# Patient Record
Sex: Female | Born: 1965 | Race: White | Hispanic: No | Marital: Married | Smoking: Never smoker
Health system: Southern US, Community
[De-identification: ages and names within clinical notes are randomized; demographics above are authoritative.]

## PROBLEM LIST (undated history)

## (undated) VITALS — BP 131/87 | HR 84 | Temp 97.1°F | Resp 16 | Ht 63.0 in | Wt 176.0 lb

## (undated) DIAGNOSIS — K219 Gastro-esophageal reflux disease without esophagitis: Secondary | ICD-10-CM

## (undated) DIAGNOSIS — R0602 Shortness of breath: Secondary | ICD-10-CM

## (undated) DIAGNOSIS — R011 Cardiac murmur, unspecified: Secondary | ICD-10-CM

## (undated) DIAGNOSIS — E669 Obesity, unspecified: Secondary | ICD-10-CM

## (undated) DIAGNOSIS — R454 Irritability and anger: Secondary | ICD-10-CM

## (undated) DIAGNOSIS — F32A Depression, unspecified: Secondary | ICD-10-CM

## (undated) DIAGNOSIS — E785 Hyperlipidemia, unspecified: Secondary | ICD-10-CM

## (undated) DIAGNOSIS — G4730 Sleep apnea, unspecified: Secondary | ICD-10-CM

## (undated) DIAGNOSIS — N951 Menopausal and female climacteric states: Secondary | ICD-10-CM

## (undated) DIAGNOSIS — F419 Anxiety disorder, unspecified: Secondary | ICD-10-CM

## (undated) DIAGNOSIS — I7300 Raynaud's syndrome without gangrene: Secondary | ICD-10-CM

## (undated) DIAGNOSIS — M1990 Unspecified osteoarthritis, unspecified site: Secondary | ICD-10-CM

## (undated) DIAGNOSIS — F1011 Alcohol abuse, in remission: Secondary | ICD-10-CM

## (undated) DIAGNOSIS — I10 Essential (primary) hypertension: Secondary | ICD-10-CM

## (undated) DIAGNOSIS — M549 Dorsalgia, unspecified: Secondary | ICD-10-CM

## (undated) DIAGNOSIS — F329 Major depressive disorder, single episode, unspecified: Secondary | ICD-10-CM

## (undated) DIAGNOSIS — H269 Unspecified cataract: Secondary | ICD-10-CM

## (undated) DIAGNOSIS — R079 Chest pain, unspecified: Secondary | ICD-10-CM

## (undated) DIAGNOSIS — F1990 Other psychoactive substance use, unspecified, uncomplicated: Secondary | ICD-10-CM

## (undated) DIAGNOSIS — M79671 Pain in right foot: Secondary | ICD-10-CM

## (undated) DIAGNOSIS — R5383 Other fatigue: Secondary | ICD-10-CM

## (undated) DIAGNOSIS — Z124 Encounter for screening for malignant neoplasm of cervix: Secondary | ICD-10-CM

## (undated) DIAGNOSIS — M79672 Pain in left foot: Secondary | ICD-10-CM

## (undated) HISTORY — DX: Major depressive disorder, single episode, unspecified: F32.9

## (undated) HISTORY — DX: Obesity, unspecified: E66.9

## (undated) HISTORY — DX: Depression, unspecified: F32.A

## (undated) HISTORY — DX: Unspecified osteoarthritis, unspecified site: M19.90

## (undated) HISTORY — DX: Encounter for screening for malignant neoplasm of cervix: Z12.4

## (undated) HISTORY — DX: Pain in right foot: M79.671

## (undated) HISTORY — DX: Anxiety disorder, unspecified: F41.9

## (undated) HISTORY — DX: Cardiac murmur, unspecified: R01.1

## (undated) HISTORY — DX: Shortness of breath: R06.02

## (undated) HISTORY — DX: Alcohol abuse, in remission: F10.11

## (undated) HISTORY — DX: Other psychoactive substance use, unspecified, uncomplicated: F19.90

## (undated) HISTORY — DX: Irritability and anger: R45.4

## (undated) HISTORY — DX: Pain in right foot: M79.672

## (undated) HISTORY — DX: Menopausal and female climacteric states: N95.1

## (undated) HISTORY — DX: Raynaud's syndrome without gangrene: I73.00

## (undated) HISTORY — DX: Essential (primary) hypertension: I10

## (undated) HISTORY — DX: Gastro-esophageal reflux disease without esophagitis: K21.9

## (undated) HISTORY — DX: Unspecified cataract: H26.9

## (undated) HISTORY — DX: Sleep apnea, unspecified: G47.30

## (undated) HISTORY — DX: Chest pain, unspecified: R07.9

## (undated) HISTORY — DX: Other fatigue: R53.83

## (undated) HISTORY — DX: Dorsalgia, unspecified: M54.9

## (undated) HISTORY — DX: Hyperlipidemia, unspecified: E78.5

---
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
## 2006-10-24 ENCOUNTER — Other Ambulatory Visit: Admission: RE | Admit: 2006-10-24 | Discharge: 2006-10-24 | Payer: Self-pay | Admitting: Family Medicine

## 2008-10-15 ENCOUNTER — Other Ambulatory Visit: Admission: RE | Admit: 2008-10-15 | Discharge: 2008-10-15 | Payer: Self-pay | Admitting: Family Medicine

## 2009-01-30 ENCOUNTER — Emergency Department (HOSPITAL_BASED_OUTPATIENT_CLINIC_OR_DEPARTMENT_OTHER): Admission: EM | Admit: 2009-01-30 | Discharge: 2009-01-30 | Payer: Self-pay | Admitting: Emergency Medicine

## 2009-08-09 ENCOUNTER — Emergency Department (HOSPITAL_BASED_OUTPATIENT_CLINIC_OR_DEPARTMENT_OTHER): Admission: EM | Admit: 2009-08-09 | Discharge: 2009-08-09 | Payer: Self-pay | Admitting: Emergency Medicine

## 2009-10-20 ENCOUNTER — Other Ambulatory Visit: Admission: RE | Admit: 2009-10-20 | Discharge: 2009-10-20 | Payer: Self-pay | Admitting: Family Medicine

## 2010-04-19 ENCOUNTER — Encounter: Payer: Self-pay | Admitting: Family Medicine

## 2010-04-20 ENCOUNTER — Encounter: Payer: Self-pay | Admitting: Family Medicine

## 2010-04-20 ENCOUNTER — Ambulatory Visit (INDEPENDENT_AMBULATORY_CARE_PROVIDER_SITE_OTHER): Payer: 59 | Admitting: Family Medicine

## 2010-04-20 DIAGNOSIS — E559 Vitamin D deficiency, unspecified: Secondary | ICD-10-CM | POA: Insufficient documentation

## 2010-04-20 DIAGNOSIS — F1010 Alcohol abuse, uncomplicated: Secondary | ICD-10-CM | POA: Insufficient documentation

## 2010-04-20 DIAGNOSIS — R030 Elevated blood-pressure reading, without diagnosis of hypertension: Secondary | ICD-10-CM | POA: Insufficient documentation

## 2010-04-20 DIAGNOSIS — F32A Depression, unspecified: Secondary | ICD-10-CM | POA: Insufficient documentation

## 2010-04-20 DIAGNOSIS — F329 Major depressive disorder, single episode, unspecified: Secondary | ICD-10-CM

## 2010-04-20 MED ORDER — LORAZEPAM 1 MG PO TABS: 1.0000 mg | ORAL_TABLET | Freq: Four times a day (QID) | ORAL | Status: DC | PRN

## 2010-04-20 NOTE — Assessment & Plan Note (Signed)
Patient with previous history that was treated with hi dose vitamin d supplements and it normalized, we will recheck levels

## 2010-04-20 NOTE — Progress Notes (Signed)
Subjective:    Patient ID: Cheryl Taylor, female    DOB: February 06, 1965, 45 y.o.   MRN: [id number]  HPI Patient is in today for a new patient appt to discuss multiple concerns. She has a significant history of alcohol abuse. She has quit in the past but has recently begun to drink again and is now up to 2 bottles of wine daily. She decided to quit drinking cold [place] yesterday and is feeling very anxious and jittery today. She reports she feels like her skin is crawling. No CP/palp/SOB/HA/fevers/chills. She has used Xanax in the past to manage her withdrawal symptoms with good effect. She is currently seeing a counselor once a week recently to manage her depression and withdrawal and she feels commited to quitting. She initially began to see the counselor due to some marriage difficulties and soon realized that she had to quit drinking before she could address her marriage difficulties. She is struggling with depression, anxiety, insomnia and agitation. She uses some Tylenol pm prn for her insomnia and that helps to a degree. No other acute concerns or recent illness/trips to the hospital    Review of Systems  Constitutional: Positive for activity change, appetite change and fatigue. Negative for fever and chills.  HENT: Negative for congestion and rhinorrhea.   Respiratory: Negative for cough, chest tightness, shortness of breath and wheezing.   Cardiovascular: Negative for palpitations and leg swelling.  Gastrointestinal: Negative for vomiting, abdominal pain, diarrhea, constipation, blood in stool and anal bleeding.  Genitourinary: Negative for dysuria, vaginal bleeding, vaginal discharge, difficulty urinating, menstrual problem and pelvic pain.  Musculoskeletal: Negative for back pain.  Neurological: Positive for tremors. Negative for seizures, syncope and headaches.  Psychiatric/Behavioral: Positive for behavioral problems, sleep disturbance, dysphoric mood and agitation. Negative for suicidal  ideas, hallucinations and confusion. The patient is nervous/anxious.    Past Medical History  Diagnosis Date  . Depression   . Anxiety   . Tachycardia 04/20/2010   Past Surgical History  Procedure Date  . Cesarean section 2003  . Tonsillectomy 1980    reports that she has never smoked. She has never used smokeless tobacco. She reports that she drinks about 30 ounces of alcohol per week. She reports that she does not use illicit drugs. family history includes Alcohol abuse in her brother and sister; Heart disease (age of onset:78) in her father; Kidney disease in her father; Kidney failure (age of onset:78) in her father; Obesity in her father and paternal grandfather; Other in her brother; Schizophrenia in her brother; and Seizures in her father. No Known Allergies      Objective:   Physical Exam  Constitutional: She is oriented to person, place, and time. She appears well-developed and well-nourished. She appears distressed.  HENT:  Head: Atraumatic.  Right Ear: External ear normal.  Left Ear: External ear normal.  Nose: Nose normal.  Mouth/Throat: Oropharynx is clear and moist. No oropharyngeal exudate.  Eyes: Conjunctivae and EOM are normal. Pupils are equal, round, and reactive to light. No scleral icterus.  Neck: Normal range of motion. Neck supple. No thyromegaly present.  Cardiovascular: Normal rate, regular rhythm and intact distal pulses.  Exam reveals no gallop and no friction rub.   No murmur heard. Pulmonary/Chest: No respiratory distress. She has no wheezes.  Abdominal: Soft. Bowel sounds are normal. She exhibits no distension and no mass. There is no tenderness. There is no rebound and no guarding.  Lymphadenopathy:    She has no cervical adenopathy.  Neurological: She  is alert and oriented to person, place, and time. She has normal reflexes. She displays normal reflexes. No cranial nerve deficit. Coordination normal.  Skin: Skin is warm and dry. She is not  diaphoretic.  Psychiatric: Her speech is normal. Thought content normal. Her mood appears anxious. Her affect is not angry and not blunt. She is agitated. She is not aggressive, is not hyperactive, not slowed, not withdrawn, not actively hallucinating and not combative. Cognition and memory are not impaired. She is attentive.          Assessment & Plan:  Alcohol abuse Patient will continue to abstain to alcohol, she is given Ativan 1mg  tabs to use as directed for the next several days to help with withdrawal symptoms. She will seek immediate medical care if her symptoms are overwhelming. Patient is declining inpatient hospitalization. Will see her as needed or in 1 week  Depression Patient is seeing a counselor weekly and taking Wellbutrin XL 150mg  daily to help manage her low moods and intermittent anxiety and she feels this is helping her to stabilize her mood enough to help her quit drinking, she is encouraged to continue both.  Vitamin D deficiency Patient with previous history that was treated with hi dose vitamin d supplements and it normalized, we will recheck levels   Blood pressure elevated without history of HTN Likely related to alcohol withdrawal, will reassess in one week and she maintain regular use of Ativan for this week, reassess after acute withdrawal period.

## 2010-04-20 NOTE — Assessment & Plan Note (Addendum)
Patient will continue to abstain to alcohol, she is given Ativan 1mg  tabs to use as directed for the next several days to help with withdrawal symptoms. She will seek immediate medical care if her symptoms are overwhelming. Patient is declining inpatient hospitalization. Will see her as needed or in 1 week

## 2010-04-20 NOTE — Assessment & Plan Note (Signed)
Likely related to alcohol withdrawal, will reassess in one week and she maintain regular use of Ativan for this week, reassess after acute withdrawal period.

## 2010-04-20 NOTE — Patient Instructions (Signed)
Alcohol Withdrawal Anytime drug use is interfering with normal living activities it has become abuse. This includes problems with family and friends. Psychological dependence has developed when your mind tells you that the drug is needed. This is usually followed by physical dependence when a continuing increase of drugs are required to get the same feeling or "high". This is known as addiction or chemical dependency. A person's risk is much higher if there is a history of chemical dependency in the family. Mild Withdrawal Following Stopping Alcohol, When Addiction or Chemical Dependency Has Developed When a person has developed tolerance to alcohol, any sudden stopping of alcohol can cause uncomfortable physical symptoms (problems). Most of the time these are mild and consist of tremors in the hands and increases in heart rate, breathing, and temperature. Sometimes these symptoms are associated with anxiety, panic attacks, and bad dreams. There may also be stomach upset. Normal sleep patterns are often interrupted with periods of insomnia (inability to sleep). This may last for 6 months. Because of this discomfort, many people choose to continue drinking to get rid of this discomfort and to try to feel normal. Severe Withdrawal with Decreased or No Alcohol Intake, When Addiction or Chemical Dependency Has Developed About five percent of alcoholics will develop signs of severe withdrawal when they stop using alcohol. One sign of this is development of generalized seizures (convulsions). Other signs of this are severe agitation and confusion. This may be associated with delusions and hallucinations (believing in things which are not real or seeing things which are not really there). Vitamin deficiencies are usually present if alcohol intake has been long term. Treatment for this most often requires hospitalization and close observation. Addiction can only be helped by stopping use of all chemicals. This is hard  but may save your life. With continual alcohol use possible outcomes are usually loss of self respect and esteem, violence, and death. It can eventually lead to prison if the use of alcohol has caused the death of another. Addiction cannot be cured but it can be stopped. This often requires outside help and the care of professionals. Treatment centers are listed in the yellow pages under Cocaine, Narcotics, and Alcoholics anonymous. Most hospitals and clinics can refer you to a specialized care center. It is not necessary for you to go through the uncomfortable symptoms of withdrawal. Your caregiver can provide you with medications that will help you through this difficult period. Try to avoid situations, friends, or drugs that made it possible for you to keep using alcohol in the past. Learn how to say no! It takes a long period of time to overcome addictions to all drugs, including alcohol. There may be many times when you feel as though you want a drink. All this amounts to is a want. After getting rid of the physical addiction and withdrawal, you will have a lessening of the craving which tells you that you need alcohol to feel normal. Call your caregiver if more support is needed. Learn who to talk to in your family and among your friends so that during these periods you can receive outside help. AA (Alcoholics Anonymous) has helped many people over the years. To get further help, contact AA or call your caregiver, counselor or minister. AL-ANON and ALA-TEEN are support groups for friends and family members of an alcoholic. The people who love and care for an alcoholic often need help, too. For information about these organizations, check your phone directory or call a local alcoholism  treatment center.  SEEK IMMEDIATE MEDICAL CARE IF:  You have a seizure.   You develop a high fever.   You experience uncontrolled vomiting or you vomit up blood. This may be bright red or look like black coffee grounds.    You have blood in the stool. This may be bright red or appear as a black tarry, foul smelling stool.   You become light headed or faint. DO NOT DRIVE IF YOU FEEL THIS WAY. Have someone else drive you or call 161 for help.   You become more agitated or confused.   You develop uncontrolled anxiety.   You begin to have hallucinations (see things that are not really there).  Your caregiver has determined that you completely understand your medical condition, and that your mental state is back to normal. You understand that you have been treated for alcohol withdrawal, have agreed not to drink any alcohol for a minimum of 1 day, will not operate a car or other machinery for 24 hours, and have had an opportunity to ask any questions about your condition. Document Released: 10/27/2004 Document Re-Released: 04/13/2009 [facility] Patient Information 2011 [place], [place]. Call with any concerns and take Ativan as directed

## 2010-04-20 NOTE — Assessment & Plan Note (Signed)
Patient is seeing a counselor weekly and taking Wellbutrin XL 150mg  daily to help manage her low moods and intermittent anxiety and she feels this is helping her to stabilize her mood enough to help her quit drinking, she is encouraged to continue both.

## 2010-04-22 ENCOUNTER — Telehealth: Payer: Self-pay

## 2010-04-22 NOTE — Telephone Encounter (Signed)
Pt informed and transferred call to Diane to schedule appt for tomorrow morning

## 2010-04-22 NOTE — Telephone Encounter (Signed)
It is not likely the Ativan but instead the withdrawal from the Alcohol. She probably needs to be evaluated she could see [facility] or an urgent care potentially this afternoon or me quickly tomorrow. Tonight she can try more Ativan 1-2 tabs q 6 hours and if no relief then would have to come back in

## 2010-04-23 ENCOUNTER — Ambulatory Visit: Payer: 59 | Admitting: Family Medicine

## 2010-04-28 ENCOUNTER — Encounter: Payer: Self-pay | Admitting: Family Medicine

## 2010-04-28 ENCOUNTER — Ambulatory Visit (INDEPENDENT_AMBULATORY_CARE_PROVIDER_SITE_OTHER): Payer: 59 | Admitting: Family Medicine

## 2010-04-28 DIAGNOSIS — F32A Depression, unspecified: Secondary | ICD-10-CM

## 2010-04-28 DIAGNOSIS — E559 Vitamin D deficiency, unspecified: Secondary | ICD-10-CM

## 2010-04-28 DIAGNOSIS — R030 Elevated blood-pressure reading, without diagnosis of hypertension: Secondary | ICD-10-CM

## 2010-04-28 DIAGNOSIS — F329 Major depressive disorder, single episode, unspecified: Secondary | ICD-10-CM

## 2010-04-28 DIAGNOSIS — F1010 Alcohol abuse, uncomplicated: Secondary | ICD-10-CM

## 2010-04-28 MED ORDER — LORAZEPAM 1 MG PO TABS: 1.0000 mg | ORAL_TABLET | Freq: Four times a day (QID) | ORAL | Status: DC | PRN

## 2010-04-28 NOTE — Assessment & Plan Note (Signed)
Improved with recheck. Patient has obtained a BP monitor if it remains elevated above 165/105 she is to call for reevaluation. Continue to titrate down Ativan as tolerated. Recheck in one month and will check labs at that visit as well

## 2010-04-28 NOTE — Assessment & Plan Note (Signed)
Agrees to lab work to reevaluate at next visit.

## 2010-04-28 NOTE — Assessment & Plan Note (Addendum)
Patient has stayed off Alcohol since last Monday now 9 days. She acknowledges agitations, diaphoresis, irritability but she does feel it is getting better. She started Ativan 4 x daily, then decreased to 3 x daily and began to feel more agitated so she took a Xanax and that helped. Presently she has titrated down to 1 Ativan bid and Xanax 1/2 tab midday. Continue to abstain and call with any concerns

## 2010-04-28 NOTE — Assessment & Plan Note (Signed)
She feels she is doing well with regards to her low mood despite her difficulties withdrawing from the alcohol and Xanax no further meds at this time, Maintain the Wellbutrin

## 2010-04-28 NOTE — Progress Notes (Signed)
Subjective:    Patient ID: Cheryl Taylor, female    DOB: May 06, 1965, 45 y.o.   MRN: [id number]  HPI Patient is in today for follow up on her new patient appt and alcohol withdrawal. She has now gone 10 days since she had any alcohol and has managed to go to work everyday. She admits to feeling agitated, sweaty, irritable and having trouble concentrating and sleeping but she denies any hallucinations, diarrhea, CP, SOB. She acknowledges symptoms are slowly improving. She has gotten a BP monitor and she reports her BP was 150/100 last night and 124/104 this am and those are the best numbers she has seen since last week. She denies any HA or syncope. She did not initially think Ativan was helping but now notes it does some but she has also started to use 1/2 of her left over Xanax inbetween her Ativan dosing and she realizes that gives her more relief. She can now see that she was also habituated to Xanax somewhat and is committed to coming off that as well. No recent illness, fevers, chills, congestion since. Past Medical History  Diagnosis Date  . Depression   . Anxiety   . Tachycardia 04/20/2010    Past Surgical History  Procedure Date  . Cesarean section 2003  . Tonsillectomy 1980    Family History  Problem Relation Age of Onset  . Kidney failure Father 58    deceased  . Kidney disease Father   . Obesity Father   . Seizures Father   . Heart disease Father 15  . Schizophrenia Brother     52yo  . Alcohol abuse Brother   . Other Brother     chronic pain, 57 yo  . Alcohol abuse Sister     5yo  . Obesity Paternal Grandfather     History   Social History  . Marital Status: Married    Spouse Name: N/A    Number of Children: N/A  . Years of Education: N/A   Occupational History  . Not on file.   Social History Main Topics  . Smoking status: Never Smoker   . Smokeless tobacco: Never Used  . Alcohol Use: No     2 bottle a night X 7 days/ pt quit on 04/19/10  . Drug Use: No   . Sexually Active: Yes -- Female partner(s)    Birth Control/ Protection: Pill   Other Topics Concern  . Not on file   Social History Narrative  . No narrative on file    Current Outpatient Prescriptions on File Prior to Visit  Medication Sig Dispense Refill  . buPROPion (WELLBUTRIN XL) 150 MG 24 hr tablet Take 150 mg by mouth daily.        . LO LOESTRIN FE 1 MG-10 MCG / 10 MCG TABS Take by mouth Daily.      Marland Kitchen DISCONTD: LORazepam (ATIVAN) 1 MG tablet Take 1 tablet (1 mg total) by mouth every 6 (six) hours as needed for anxiety (take 3 x daily for 4 days then 2 x daily and as needed for insomnia/anxiety/withdrawal, max 4 tab/24hr).  60 tablet  0  . NON FORMULARY 1 tablet daily. Loestrin 1 daily         No Known Allergies    Review of Systems  Constitutional: Positive for diaphoresis, activity change and fatigue. Negative for fever, chills and appetite change.  HENT: Negative for congestion and neck pain.   Respiratory: Negative for chest tightness and shortness of breath.  Cardiovascular: Positive for palpitations. Negative for chest pain and leg swelling.  Gastrointestinal: Negative for nausea, vomiting, abdominal pain, diarrhea and constipation.  Genitourinary: Negative for frequency.  Musculoskeletal: Negative for myalgias.  Neurological: Positive for tremors and light-headedness. Negative for dizziness, syncope and headaches.  Psychiatric/Behavioral: Positive for sleep disturbance, dysphoric mood and agitation. Negative for suicidal ideas, hallucinations and self-injury. The patient is nervous/anxious.        Objective:   Physical Exam  Constitutional: She is oriented to person, place, and time. She appears well-developed and well-nourished. No distress.  HENT:  Head: Atraumatic.  Eyes: Conjunctivae are normal.  Neck: Neck supple. No thyromegaly present.  Cardiovascular: Normal rate, regular rhythm and normal heart sounds.   Pulmonary/Chest: Effort normal. No  respiratory distress.  Abdominal: Soft. She exhibits no distension.  Musculoskeletal: She exhibits no edema.  Neurological: She is alert and oriented to person, place, and time.  Skin: Skin is warm and dry. She is not diaphoretic.  Psychiatric: Her behavior is normal. Judgment and thought content normal.       Slightly pressured speech          Assessment & Plan:  Alcohol abuse Patient has stayed off Alcohol since last Monday now 9 days. She acknowledges agitations, diaphoresis, irritability but she does feel it is getting better. She started Ativan 4 x daily, then decreased to 3 x daily and began to feel more agitated so she took a Xanax and that helped. Presently she has titrated down to 1 Ativan bid and Xanax 1/2 tab midday. Continue to abstain and call with any concerns  Blood pressure elevated without history of HTN Improved with recheck. Patient has obtained a BP monitor if it remains elevated above 165/105 she is to call for reevaluation. Continue to titrate down Ativan as tolerated. Recheck in one month and will check labs at that visit as well  Depression She feels she is doing well with regards to her low mood despite her difficulties withdrawing from the alcohol and Xanax no further meds at this time, Maintain the Wellbutrin  Vitamin D deficiency Agrees to lab work to reevaluate at next visit.

## 2010-04-28 NOTE — Patient Instructions (Signed)

## 2010-05-31 ENCOUNTER — Ambulatory Visit: Payer: 59 | Admitting: Family Medicine

## 2010-06-07 ENCOUNTER — Encounter: Payer: Self-pay | Admitting: Family Medicine

## 2010-06-07 ENCOUNTER — Ambulatory Visit (INDEPENDENT_AMBULATORY_CARE_PROVIDER_SITE_OTHER): Payer: 59 | Admitting: Family Medicine

## 2010-06-07 DIAGNOSIS — I10 Essential (primary) hypertension: Secondary | ICD-10-CM | POA: Insufficient documentation

## 2010-06-07 DIAGNOSIS — F329 Major depressive disorder, single episode, unspecified: Secondary | ICD-10-CM

## 2010-06-07 DIAGNOSIS — Z136 Encounter for screening for cardiovascular disorders: Secondary | ICD-10-CM

## 2010-06-07 DIAGNOSIS — F32A Depression, unspecified: Secondary | ICD-10-CM

## 2010-06-07 DIAGNOSIS — F10239 Alcohol dependence with withdrawal, unspecified: Secondary | ICD-10-CM

## 2010-06-07 DIAGNOSIS — R0789 Other chest pain: Secondary | ICD-10-CM

## 2010-06-07 DIAGNOSIS — F1010 Alcohol abuse, uncomplicated: Secondary | ICD-10-CM

## 2010-06-07 DIAGNOSIS — R079 Chest pain, unspecified: Secondary | ICD-10-CM | POA: Insufficient documentation

## 2010-06-07 MED ORDER — BUPROPION HCL 75 MG PO TABS: 75.0000 mg | ORAL_TABLET | Freq: Three times a day (TID) | ORAL | Status: DC

## 2010-06-07 MED ORDER — LORAZEPAM 1 MG PO TABS: ORAL_TABLET | ORAL | Status: DC

## 2010-06-07 MED ORDER — BUPROPION HCL 75 MG PO TABS: 75.0000 mg | ORAL_TABLET | Freq: Two times a day (BID) | ORAL | Status: DC

## 2010-06-07 MED ORDER — METOPROLOL SUCCINATE ER 50 MG PO TB24: 50.0000 mg | ORAL_TABLET | Freq: Every day | ORAL | Status: DC

## 2010-06-07 NOTE — Assessment & Plan Note (Signed)
Unfortunately she has returned to drinking after going on a vacation. She is back up to a bottle of wine daily. She has continued to take her Ativan tid and she says she does not feel sedated is sleeping poorly and agitated. She is to come back next week for reassessment but may take 1-2 Ativan up to tid to help with withdrawal as long as she is cutting back on and eventually stopping the alcohol. Patient is uninterested in [place] but agrees to some one on one counseling with Behavioral Health

## 2010-06-07 NOTE — Assessment & Plan Note (Signed)
Patient brings in her cuff from home which show persistently elevated BP and frequently elevated pulse. Will start Metoprolol XR 50mg  po qd and reassess next week

## 2010-06-07 NOTE — Patient Instructions (Signed)
Alcohol Withdrawal Anytime drug use is interfering with normal living activities it has become abuse. This includes problems with family and friends. Psychological dependence has developed when your mind tells you that the drug is needed. This is usually followed by physical dependence when a continuing increase of drugs are required to get the same feeling or "high". This is known as addiction or chemical dependency. A person's risk is much higher if there is a history of chemical dependency in the family. Mild Withdrawal Following Stopping Alcohol, When Addiction or Chemical Dependency Has Developed When a person has developed tolerance to alcohol, any sudden stopping of alcohol can cause uncomfortable physical symptoms (problems). Most of the time these are mild and consist of tremors in the hands and increases in heart rate, breathing, and temperature. Sometimes these symptoms are associated with anxiety, panic attacks, and bad dreams. There may also be stomach upset. Normal sleep patterns are often interrupted with periods of insomnia (inability to sleep). This may last for 6 months. Because of this discomfort, many people choose to continue drinking to get rid of this discomfort and to try to feel normal. Severe Withdrawal with Decreased or No Alcohol Intake, When Addiction or Chemical Dependency Has Developed About five percent of alcoholics will develop signs of severe withdrawal when they stop using alcohol. One sign of this is development of generalized seizures (convulsions). Other signs of this are severe agitation and confusion. This may be associated with delusions and hallucinations (believing in things which are not real or seeing things which are not really there). Vitamin deficiencies are usually present if alcohol intake has been long term. Treatment for this most often requires hospitalization and close observation. Addiction can only be helped by stopping use of all chemicals. This is hard  but may save your life. With continual alcohol use possible outcomes are usually loss of self respect and esteem, violence, and death. It can eventually lead to prison if the use of alcohol has caused the death of another. Addiction cannot be cured but it can be stopped. This often requires outside help and the care of professionals. Treatment centers are listed in the yellow pages under Cocaine, Narcotics, and Alcoholics anonymous. Most hospitals and clinics can refer you to a specialized care center. It is not necessary for you to go through the uncomfortable symptoms of withdrawal. Your caregiver can provide you with medications that will help you through this difficult period. Try to avoid situations, friends, or drugs that made it possible for you to keep using alcohol in the past. Learn how to say no! It takes a long period of time to overcome addictions to all drugs, including alcohol. There may be many times when you feel as though you want a drink. All this amounts to is a want. After getting rid of the physical addiction and withdrawal, you will have a lessening of the craving which tells you that you need alcohol to feel normal. Call your caregiver if more support is needed. Learn who to talk to in your family and among your friends so that during these periods you can receive outside help. AA (Alcoholics Anonymous) has helped many people over the years. To get further help, contact AA or call your caregiver, counselor or minister. AL-ANON and ALA-TEEN are support groups for friends and family members of an alcoholic. The people who love and care for an alcoholic often need help, too. For information about these organizations, check your phone directory or call a local alcoholism  treatment center.  SEEK IMMEDIATE MEDICAL CARE IF:  You have a seizure.   You develop a high fever.   You experience uncontrolled vomiting or you vomit up blood. This may be bright red or look like black coffee grounds.    You have blood in the stool. This may be bright red or appear as a black tarry, foul smelling stool.   You become light headed or faint. DO NOT DRIVE IF YOU FEEL THIS WAY. Have someone else drive you or call 161 for help.   You become more agitated or confused.   You develop uncontrolled anxiety.   You begin to have hallucinations (see things that are not really there).  Your caregiver has determined that you completely understand your medical condition, and that your mental state is back to normal. You understand that you have been treated for alcohol withdrawal, have agreed not to drink any alcohol for a minimum of 1 day, will not operate a car or other machinery for 24 hours, and have had an opportunity to ask any questions about your condition. Document Released: 10/27/2004 Document Re-Released: 04/13/2009 [facility] Patient Information 2011 [place], [place].Hypertension (High Blood Pressure) As your heart beats, it forces blood through your arteries. This force is your blood pressure. If the pressure is too high, it is called hypertension (HTN) or high blood pressure. HTN is dangerous because you may have it and not know it. High blood pressure may mean that your heart has to work harder to pump blood. Your arteries may be narrow or stiff. The extra work puts you at risk for heart disease, stroke, and other problems.  Blood pressure consists of two numbers, a higher number over a lower, 110/72, for example. It is stated as "110 over 72." The ideal is below 120 for the top number (systolic) and under 80 for the bottom (diastolic). Write down your blood pressure today. You should pay close attention to your blood pressure if you have certain conditions such as:  Heart failure.  Prior heart attack.   Diabetes   Chronic kidney disease.   Prior stroke.   Multiple risk factors for heart disease.   To see if you have HTN, your blood pressure should be measured while you are seated with your  arm held at the level of the heart. It should be measured at least twice. A one-time elevated blood pressure reading (especially in the Emergency Department) does not mean that you need treatment. There may be conditions in which the blood pressure is different between your right and left arms. It is important to see your caregiver soon for a recheck. Most people have essential hypertension which means that there is not a specific cause. This type of high blood pressure may be lowered by changing lifestyle factors such as:  Stress.  Smoking.   Lack of exercise.   Excessive weight.  Drug/tobacco/alcohol use.   Eating less salt.   Most people do not have symptoms from high blood pressure until it has caused damage to the body. Effective treatment can often prevent, delay or reduce that damage. TREATMENT Treatment for high blood pressure, when a cause has been identified, is directed at the cause. There are a large number of medications to treat HTN. These fall into several categories, and your caregiver will help you select the medicines that are best for you. Medications may have side effects. You should review side effects with your caregiver. If your blood pressure stays high after you have made  lifestyle changes or started on medicines,   Your medication(s) may need to be changed.   Other problems may need to be addressed.   Be certain you understand your prescriptions, and know how and when to take your medicine.   Be sure to follow up with your caregiver within the time frame advised (usually within two weeks) to have your blood pressure rechecked and to review your medications.   If you are taking more than one medicine to lower your blood pressure, make sure you know how and at what times they should be taken. Taking two medicines at the same time can result in blood pressure that is too low.  SEEK IMMEDIATE MEDICAL CARE IF YOU DEVELOP:  A severe headache, blurred or changing  vision, or confusion.   Unusual weakness or numbness, or a faint feeling.   Severe chest or abdominal pain, vomiting, or breathing problems.  MAKE SURE YOU:   Understand these instructions.   Will watch your condition.   Will get help right away if you are not doing well or get worse.  Document Released: 01/17/2005 Document Re-Released: 07/07/2009 [facility] Patient Information 2011 [place], [place].

## 2010-06-07 NOTE — Progress Notes (Signed)
Cheryl Taylor [id number] [birth year] 06/07/2010      Progress Note-Follow Up  Subjective  Chief Complaint  Chief Complaint  Patient presents with  . Hypertension    follow up    HPI  Patient is a 45 year old Caucasian female in today for reevaluation of multiple medical problems including recurrent alcohol abuse. She had been doing well and had weaned herself off alcohol but then recently went on a trip to go began drinking again and has continued to drink when she's returned home. She is back up to about about 1 and night. She says unfortunately this is likely increased stress at home with more fighting with her husband. She she finds herself unable to sleep very lethargic struggling with anhedonia and very frustrated with her current situation. The weekend she lighter on the couch drank and felt tired. She has had a little bit of a questionable dyspepsia and chest discomfort and does have an episode and also while she should in the building. He passes. Without associated symptoms such as palpitations diaphoresis. She does have some chest discomfort when this occurs. The pain and symptoms resolved spontaneously and her EKG is normal during this time. She reports no recent illness, fevers, chills. She denies any GI or GU complaints at this time. She notes besides having significant stress at home she's also had increased stress in the work place at the moment is having trouble finding the energy and interest to get her work done but also having trouble sleeping.  Past Medical History  Diagnosis Date  . Depression   . Anxiety   . Tachycardia 04/20/2010  . Alcohol abuse 04/20/2010  . Vitamin D deficiency 04/20/2010  . HTN (hypertension) 06/07/2010    Past Surgical History  Procedure Date  . Cesarean section 2003  . Tonsillectomy 1980    Family History  Problem Relation Age of Onset  . Kidney failure Father 64    deceased  . Kidney disease Father   . Obesity Father   . Seizures Father    . Heart disease Father 38  . Schizophrenia Brother     52yo  . Alcohol abuse Brother   . Other Brother     chronic pain, 29 yo  . Alcohol abuse Sister     58yo  . Obesity Paternal Grandfather     History   Social History  . Marital Status: Married    Spouse Name: N/A    Number of Children: N/A  . Years of Education: N/A   Occupational History  . Not on file.   Social History Main Topics  . Smoking status: Never Smoker   . Smokeless tobacco: Never Used  . Alcohol Use: No     2 bottle a night X 7 days/ pt quit on 04/19/10  . Drug Use: No  . Sexually Active: Yes -- Female partner(s)    Birth Control/ Protection: Pill   Other Topics Concern  . Not on file   Social History Narrative  . No narrative on file    Current Outpatient Prescriptions on File Prior to Visit  Medication Sig Dispense Refill  . LO LOESTRIN FE 1 MG-10 MCG / 10 MCG TABS Take by mouth Daily.      Marland Kitchen DISCONTD: buPROPion (WELLBUTRIN XL) 150 MG 24 hr tablet Take 150 mg by mouth daily.        Marland Kitchen DISCONTD: LORazepam (ATIVAN) 1 MG tablet Take 1 tablet (1 mg total) by mouth every 6 (six) hours as needed for  anxiety (take 3 x daily for 4 days then 2 x daily and as needed for insomnia/anxiety/withdrawal, max 4 tab/24hr).  90 tablet  1  . DISCONTD: NON FORMULARY 1 tablet daily. Loestrin 1 daily         No Known Allergies  Review of Systems  Review of Systems  Constitutional: Positive for malaise/fatigue. Negative for fever.  HENT: Negative for congestion.   Eyes: Negative for discharge.  Respiratory: Negative for shortness of breath.   Cardiovascular: Negative for chest pain, palpitations and leg swelling.  Gastrointestinal: Negative for nausea, abdominal pain and diarrhea.  Genitourinary: Negative for dysuria.  Musculoskeletal: Negative for falls.  Skin: Negative for rash.  Neurological: Negative for loss of consciousness, weakness and headaches.  Endo/Heme/Allergies: Negative for polydipsia.    Psychiatric/Behavioral: Positive for depression and substance abuse. Negative for suicidal ideas. The patient is nervous/anxious and has insomnia.     Objective  BP 192/112  Pulse 98  Temp(Src) 99.5 F (37.5 C) (Oral)  Ht 5\' 4"  (1.626 m)  Wt 160 lb 12.8 oz (72.938 kg)  BMI 27.60 kg/m2  SpO2 100%  Physical Exam  Physical Exam  Constitutional: She is oriented to person, place, and time and well-developed, well-nourished, and in no distress. No distress.  HENT:  Head: Normocephalic and atraumatic.  Eyes: Conjunctivae are normal.  Neck: Neck supple. No thyromegaly present.  Cardiovascular: Normal rate, regular rhythm and normal heart sounds.   No murmur heard. Pulmonary/Chest: Effort normal and breath sounds normal. She has no wheezes.  Abdominal: She exhibits no distension and no mass.  Musculoskeletal: She exhibits no edema.  Lymphadenopathy:    She has no cervical adenopathy.  Neurological: She is alert and oriented to person, place, and time.  Skin: Skin is warm and dry. No rash noted. She is not diaphoretic.  Psychiatric: Memory, affect and judgment normal.       Assessment & Plan Depression Patient continues to take her Wellbutrin although she acknowledges skipping an occasional dose especially on the weekend because she feels she is jittery in the 1-2 hours after taking it. We will split up her dosing to 75mg  po bid and reassess. She agrees to go for counselinf for her depression and alcohol addiction and for evaluation of the possibility of Bipolar disorder, she is given a handout of [facility] and she agrees to contact them  Alcohol abuse Unfortunately she has returned to drinking after going on a vacation. She is back up to a bottle of wine daily. She has continued to take her Ativan tid and she says she does not feel sedated is sleeping poorly and agitated. She is to come back next week for reassessment but may take 1-2 Ativan up to tid to help with  withdrawal as long as she is cutting back on and eventually stopping the alcohol. Patient is uninterested in [place] but agrees to some one on one counseling with Behavioral Health  HTN (hypertension) Patient brings in her cuff from home which show persistently elevated BP and frequently elevated pulse. Will start Metoprolol XR 50mg  po qd and reassess next week  Chest pain, atypical Likely related to excessive alcohol intake and/or dyspepsia, patient is offered an appt with cardiology for further evaluation but after her EKG was normal and her pain dissipated she declined, she will call if pain becomes frequent or worsens. Needs to stop alcohol and avoid spicy foods

## 2010-06-07 NOTE — Assessment & Plan Note (Signed)
Patient continues to take her Wellbutrin although she acknowledges skipping an occasional dose especially on the weekend because she feels she is jittery in the 1-2 hours after taking it. We will split up her dosing to 75mg  po bid and reassess. She agrees to go for counselinf for her depression and alcohol addiction and for evaluation of the possibility of Bipolar disorder, she is given a handout of [facility] and she agrees to contact them

## 2010-06-07 NOTE — Assessment & Plan Note (Signed)
Likely related to excessive alcohol intake and/or dyspepsia, patient is offered an appt with cardiology for further evaluation but after her EKG was normal and her pain dissipated she declined, she will call if pain becomes frequent or worsens. Needs to stop alcohol and avoid spicy foods

## 2010-06-14 ENCOUNTER — Ambulatory Visit: Payer: 59 | Admitting: Family Medicine

## 2010-06-14 ENCOUNTER — Telehealth: Payer: Self-pay

## 2010-06-14 MED ORDER — VALACYCLOVIR HCL 1 G PO TABS: 1000.0000 mg | ORAL_TABLET | Freq: Every day | ORAL | Status: DC

## 2010-06-14 NOTE — Telephone Encounter (Signed)
OK to refill Valtrex 1000mg  daily with same sig #30 with 5 refills

## 2010-06-15 ENCOUNTER — Other Ambulatory Visit: Payer: Self-pay

## 2010-06-15 DIAGNOSIS — F329 Major depressive disorder, single episode, unspecified: Secondary | ICD-10-CM

## 2010-06-15 DIAGNOSIS — F32A Depression, unspecified: Secondary | ICD-10-CM

## 2010-06-15 MED ORDER — VALACYCLOVIR HCL 1 G PO TABS: 1000.0000 mg | ORAL_TABLET | Freq: Every day | ORAL | Status: DC

## 2010-06-21 ENCOUNTER — Encounter: Payer: Self-pay | Admitting: Family Medicine

## 2010-06-21 ENCOUNTER — Ambulatory Visit (INDEPENDENT_AMBULATORY_CARE_PROVIDER_SITE_OTHER): Payer: 59 | Admitting: Family Medicine

## 2010-06-21 ENCOUNTER — Other Ambulatory Visit (HOSPITAL_COMMUNITY)
Admission: RE | Admit: 2010-06-21 | Discharge: 2010-06-21 | Disposition: A | Discharge status: Home / Self Care | Payer: 59 | Source: Ambulatory Visit | Attending: Family Medicine | Admitting: Family Medicine

## 2010-06-21 VITALS — BP 142/86 | HR 85 | Temp 99.5°F | Ht 64.0 in | Wt 161.0 lb

## 2010-06-21 DIAGNOSIS — R5383 Other fatigue: Secondary | ICD-10-CM

## 2010-06-21 DIAGNOSIS — R1032 Left lower quadrant pain: Secondary | ICD-10-CM

## 2010-06-21 DIAGNOSIS — R5381 Other malaise: Secondary | ICD-10-CM

## 2010-06-21 DIAGNOSIS — E785 Hyperlipidemia, unspecified: Secondary | ICD-10-CM

## 2010-06-21 DIAGNOSIS — R197 Diarrhea, unspecified: Secondary | ICD-10-CM

## 2010-06-21 DIAGNOSIS — I10 Essential (primary) hypertension: Secondary | ICD-10-CM

## 2010-06-21 DIAGNOSIS — F10239 Alcohol dependence with withdrawal, unspecified: Secondary | ICD-10-CM

## 2010-06-21 DIAGNOSIS — Z113 Encounter for screening for infections with a predominantly sexual mode of transmission: Secondary | ICD-10-CM | POA: Insufficient documentation

## 2010-06-21 DIAGNOSIS — Z7251 High risk heterosexual behavior: Secondary | ICD-10-CM | POA: Insufficient documentation

## 2010-06-21 DIAGNOSIS — D649 Anemia, unspecified: Secondary | ICD-10-CM

## 2010-06-21 DIAGNOSIS — F1010 Alcohol abuse, uncomplicated: Secondary | ICD-10-CM

## 2010-06-21 DIAGNOSIS — N760 Acute vaginitis: Secondary | ICD-10-CM | POA: Insufficient documentation

## 2010-06-21 DIAGNOSIS — R82998 Other abnormal findings in urine: Secondary | ICD-10-CM

## 2010-06-21 DIAGNOSIS — R0789 Other chest pain: Secondary | ICD-10-CM

## 2010-06-21 DIAGNOSIS — R8271 Bacteriuria: Secondary | ICD-10-CM

## 2010-06-21 LAB — POCT URINALYSIS DIPSTICK
Bilirubin, UA: NEGATIVE
Glucose, UA: NEGATIVE
Ketones, UA: NEGATIVE
Nitrite, UA: NEGATIVE
Protein, UA: 30
Spec Grav, UA: 1.02
Urobilinogen, UA: 0.2
pH, UA: 6

## 2010-06-21 LAB — HEPATIC FUNCTION PANEL
ALT: 18 U/L (ref 0–35)
AST: 23 U/L (ref 0–37)
Albumin: 3.9 g/dL (ref 3.5–5.2)
Alkaline Phosphatase: 35 U/L — ABNORMAL LOW (ref 39–117)
Bilirubin, Direct: 0.1 mg/dL (ref 0.0–0.3)
Total Bilirubin: 0.1 mg/dL — ABNORMAL LOW (ref 0.3–1.2)
Total Protein: 6.8 g/dL (ref 6.0–8.3)

## 2010-06-21 LAB — LIPID PANEL
Cholesterol: 135 mg/dL (ref 0–200)
HDL: 50.7 mg/dL (ref >39.00)
LDL Cholesterol: 61 mg/dL (ref 0–99)
Total CHOL/HDL Ratio: 3
Triglycerides: 119 mg/dL (ref 0.0–149.0)
VLDL: 23.8 mg/dL (ref 0.0–40.0)

## 2010-06-21 LAB — TSH: TSH: 1.09 u[IU]/mL (ref 0.35–5.50)

## 2010-06-21 LAB — RENAL FUNCTION PANEL
Albumin: 3.9 g/dL (ref 3.5–5.2)
BUN: 15 mg/dL (ref 6–23)
CO2: 24 mEq/L (ref 19–32)
Calcium: 9.1 mg/dL (ref 8.4–10.5)
Chloride: 103 mEq/L (ref 96–112)
Creatinine, Ser: 0.8 mg/dL (ref 0.4–1.2)
GFR: 85.27 mL/min (ref >60.00)
Glucose, Bld: 68 mg/dL — ABNORMAL LOW (ref 70–99)
Phosphorus: 3 mg/dL (ref 2.3–4.6)
Potassium: 4 mEq/L (ref 3.5–5.1)
Sodium: 136 mEq/L (ref 135–145)

## 2010-06-21 LAB — CBC WITH DIFFERENTIAL/PLATELET
Basophils Absolute: 0 10*3/uL (ref 0.0–0.1)
Basophils Relative: 0.3 % (ref 0.0–3.0)
Eosinophils Absolute: 0.2 10*3/uL (ref 0.0–0.7)
Eosinophils Relative: 1.1 % (ref 0.0–5.0)
HCT: 39.1 % (ref 36.0–46.0)
Hemoglobin: 13 g/dL (ref 12.0–15.0)
Lymphocytes Relative: 13 % (ref 12.0–46.0)
Lymphs Abs: 1.9 10*3/uL (ref 0.7–4.0)
MCHC: 33.3 g/dL (ref 30.0–36.0)
MCV: 92.9 fl (ref 78.0–100.0)
Monocytes Absolute: 0.6 10*3/uL (ref 0.1–1.0)
Monocytes Relative: 3.9 % (ref 3.0–12.0)
Neutro Abs: 12.1 10*3/uL — ABNORMAL HIGH (ref 1.4–7.7)
Neutrophils Relative %: 81.7 % — ABNORMAL HIGH (ref 43.0–77.0)
Platelets: 294 10*3/uL (ref 150.0–400.0)
RBC: 4.21 Mil/uL (ref 3.87–5.11)
RDW: 12.6 % (ref 11.5–14.6)
WBC: 14.8 10*3/uL — ABNORMAL HIGH (ref 4.5–10.5)

## 2010-06-21 MED ORDER — LORAZEPAM 1 MG PO TABS: ORAL_TABLET | ORAL | Status: DC

## 2010-06-21 NOTE — Patient Instructions (Addendum)
Gastroenteritis You have gastroenteritis. This is a common viral illness. Symptoms can include nausea, vomiting, stomach cramps, diarrhea, and a slight fever. Most of the time viral gastroenteritis clears up in 2-3 days with bed rest and a clear liquid diet. If you are not vomiting, you can start having 3 to 5 small sips of water (or other clear liquid) every 20-30 minutes. Once nausea has cleared and diarrhea slowed, gradually increase clear liquids, over the next 12 hours to 1-2 cups every hour as tolerated. You can then try sodas, Gatorade, broth, and jell-o if your cramps and nausea are gone. Try crackers and dry toast as your symptoms improve, usually by 24 hours, but progress slowly with small helpings. Stay away from milk and dairy products, alcohol, and drugs that upset your stomach for the next 2 to 3 days. Vomiting with gastroenteritis may be as violent and prolonged as with food poisoning. If other members of your family also become ill, check with your health care provider's office or the health department. Wash your hands well to avoid spreading any germs (bacteria or virus). SEEK IMMEDIATE MEDICAL CARE IF:  There is a fainting episode.   You or your child are unable to keep fluids down.   Signs of dehydration including, dry mucous membranes of the mouth, no tears from the eyes, decreased urination or less wetting of diapers are present.   Abdominal pain develops, increases or localizes. (Right sided pain can be appendicitis and left sided pain in adults can be diverticulitis).   You or your child develop a high fever (an oral temperature above 102 F (38.9 C) for over 3 days).   Diarrhea becomes excessive or contains blood or mucus.   Lethargy or confusion develops.   The amount of urine decreases.   Vomiting or diarrhea persists more than 48 hours.   Excessive weakness, dizziness, fainting or extreme thirst develops.  Document Released: 01/17/2005 Document Re-Released:  01/05/2009 [facility] Patient Information 2011 [place], [place].  Consider a yogurt daily and/or Align caps daily (probiotic)  Try Desitin ointment with Zinc twice daily

## 2010-06-21 NOTE — Assessment & Plan Note (Signed)
Patient went to a wedding over the weekend and on the way home developed bad abdominal cramps and diffuse sweating, she feels she had food poisoning. She had nausea but not vomiting, she thought she would have diarrhea but she did not. She has since had a normal bowel movement. She is encouraged to start a probiotic and/or a yogurt daily

## 2010-06-21 NOTE — Progress Notes (Signed)
Addended by: Danise Edge on: 06/21/2010 10:31 AM   Modules accepted: Orders

## 2010-06-21 NOTE — Progress Notes (Signed)
Cheryl Taylor [id number] Dec 01, 1965 06/21/2010      Progress Note-Follow Up  Subjective  Chief Complaint  Chief Complaint  Patient presents with  . Hypertension    1 week follow up    HPI  Patient is a 46 yo Caucasian female in today for follow up on HTN and excessive alcohol use. She reports she is doing much better at this time. She is no longer going home and drinking excessively on weekdays. She has come to social events and don't small amounts and felt as if she was in control the alcohol. She denies any chest pain, palpitations, shortness of breath, fevers, chills. She did go away and will not going on for abdominal cramping diaphoresis and nausea. She feels she had food poisoning but she never had active vomiting or diarrhea has since moved her bowels normally. She does not plan to completely quit drinking at this point she feels she can control her symptoms she did not just today an extramarital disorder. She has been suggestive with the moment without barrier protection and has been told recently that his wife is actually having multiple young woman without protection as well she is requesting testing. She denies any vaginitis that is active. She denies any vaginal discharge, lesions, itching. She does note some urinary hesitancy over the last 2 days. She does also note some rectal irritation after a bout of constipation. She denies any active pain or bleeding in this regard.  Past Medical History  Diagnosis Date  . Depression   . Anxiety   . Tachycardia 04/20/2010  . Alcohol abuse 04/20/2010  . Vitamin D deficiency 04/20/2010  . HTN (hypertension) 06/07/2010  . Chest pain, atypical 06/07/2010  . Diarrhea 06/21/2010  . High risk sexual behavior 06/21/2010    Past Surgical History  Procedure Date  . Cesarean section 2003  . Tonsillectomy 1980    Family History  Problem Relation Age of Onset  . Kidney failure Father 37    deceased  . Kidney disease Father   . Obesity Father     . Seizures Father   . Heart disease Father 73  . Schizophrenia Brother     52yo  . Alcohol abuse Brother   . Other Brother     chronic pain, 19 yo  . Alcohol abuse Sister     31yo  . Obesity Paternal Grandfather     History   Social History  . Marital Status: Married    Spouse Name: N/A    Number of Children: N/A  . Years of Education: N/A   Occupational History  . Not on file.   Social History Main Topics  . Smoking status: Never Smoker   . Smokeless tobacco: Never Used  . Alcohol Use: No     2 bottle a night X 7 days/ pt quit on 04/19/10  . Drug Use: No  . Sexually Active: Yes -- Female partner(s)    Birth Control/ Protection: Pill   Other Topics Concern  . Not on file   Social History Narrative  . No narrative on file    Current Outpatient Prescriptions on File Prior to Visit  Medication Sig Dispense Refill  . buPROPion (WELLBUTRIN) 75 MG tablet Take 1 tablet (75 mg total) by mouth 2 (two) times daily.  60 tablet  2  . LO LOESTRIN FE 1 MG-10 MCG / 10 MCG TABS Take by mouth Daily.      . metoprolol (TOPROL-XL) 50 MG 24 hr tablet Take 1  tablet (50 mg total) by mouth daily.  30 tablet  11  . valACYclovir (VALTREX) 1000 MG tablet Take 1 tablet (1,000 mg total) by mouth daily.  90 tablet  3  . DISCONTD: LORazepam (ATIVAN) 1 MG tablet 1-2 tabs po tid prn anxiety/alcohol withdrawal  100 tablet  1    No Known Allergies  Review of Systems  Review of Systems  Constitutional: Negative for fever and malaise/fatigue.  HENT: Negative for congestion.   Eyes: Negative for discharge.  Respiratory: Negative for shortness of breath.   Cardiovascular: Negative for chest pain, palpitations and leg swelling.  Gastrointestinal: Positive for abdominal pain. Negative for nausea, vomiting and diarrhea.       Was struggling with constipation and developed somet rectal irriation, it is not painful but the lesions persist. No blood  Genitourinary: Negative for dysuria.       Urinary  hesitancy over the past few days. No dysuria  Musculoskeletal: Negative for myalgias and falls.  Skin: Negative for rash.  Neurological: Negative for loss of consciousness and headaches.  Endo/Heme/Allergies: Negative for polydipsia.  Psychiatric/Behavioral: Negative for depression and suicidal ideas. The patient is not nervous/anxious and does not have insomnia.     Objective  BP 142/86  Pulse 85  Temp(Src) 99.5 F (37.5 C) (Oral)  Ht 5\' 4"  (1.626 m)  Wt 161 lb (73.029 kg)  BMI 27.64 kg/m2  SpO2 100%  Physical Exam  Physical Exam  Constitutional: She is oriented to person, place, and time and well-developed, well-nourished, and in no distress. No distress.  HENT:  Head: Normocephalic and atraumatic.  Eyes: Conjunctivae are normal.  Neck: Neck supple. No thyromegaly present.  Cardiovascular: Normal rate, regular rhythm and normal heart sounds.   No murmur heard. Pulmonary/Chest: Effort normal and breath sounds normal. She has no wheezes.  Abdominal: She exhibits no distension and no mass.  Genitourinary: Uterus normal and right adnexa normal. Vaginal discharge found.       Left adnexa mildly tender with palp, no masses. Whitish vaginal discharge noted. Dry skin around rectum no ulcers or verrucous lesions  Musculoskeletal: She exhibits no edema.  Lymphadenopathy:    She has no cervical adenopathy.  Neurological: She is alert and oriented to person, place, and time.  Skin: Skin is warm and dry. No rash noted. She is not diaphoretic.  Psychiatric: Memory, affect and judgment normal.    No results found for this basename: TSH   No results found for this basename: WBC, HGB, HCT, MCV, PLT   No results found for this basename: CREATININE, BUN, NA, K, CL, CO2   No results found for this basename: ALT, AST, GGT, ALKPHOS, BILITOT   No results found for this basename: CHOL   No results found for this basename: HDL   No results found for this basename: LDLCALC   No results  found for this basename: TRIG   No results found for this basename: CHOLHDL     Assessment & Plan  High risk sexual behavior Patient has been active with a partner other than her longterm partner without protection and has just found out that his wife has been having an affair as well with multiple young partners having unprotected intercourse as well. Will check HIV, HSV with reflex, RPR, Wet Prep and notify patient of results  HTN (hypertension) Improved control, continue Metoprolol  Diarrhea Patient went to a wedding over the weekend and on the way home developed bad abdominal cramps and diffuse sweating, she feels she  had food poisoning. She had nausea but not vomiting, she thought she would have diarrhea but she did not. She has since had a normal bowel movement. She is encouraged to start a probiotic and/or a yogurt daily  Alcohol abuse Patient reports she is doing better again. She is not drinking during the week and only drinking smaller amounts at social events, no more than a couple of drinks per event. She does not plan to stop altogether if she can control it this way.   Chest pain, atypical No c/o today

## 2010-06-21 NOTE — Assessment & Plan Note (Signed)
No c/o today 

## 2010-06-21 NOTE — Assessment & Plan Note (Signed)
Patient has been active with a partner other than her longterm partner without protection and has just found out that his wife has been having an affair as well with multiple young partners having unprotected intercourse as well. Will check HIV, HSV with reflex, RPR, Wet Prep and notify patient of results

## 2010-06-21 NOTE — Progress Notes (Signed)
Addended by: Danise Edge on: 06/21/2010 10:25 AM   Modules accepted: Orders

## 2010-06-21 NOTE — Assessment & Plan Note (Signed)
Improved control, continue Metoprolol

## 2010-06-21 NOTE — Assessment & Plan Note (Signed)
Patient reports she is doing better again. She is not drinking during the week and only drinking smaller amounts at social events, no more than a couple of drinks per event. She does not plan to stop altogether if she can control it this way.

## 2010-06-22 ENCOUNTER — Other Ambulatory Visit: Payer: Self-pay | Admitting: Family Medicine

## 2010-06-22 LAB — HIV ANTIBODY (ROUTINE TESTING W REFLEX): HIV: NONREACTIVE

## 2010-06-22 LAB — HSV(HERPES SIMPLEX VRS) I + II AB-IGM: Herpes Simplex Vrs I&II-IgM Ab (EIA): 0.83 INDEX

## 2010-06-22 NOTE — Progress Notes (Signed)
Addended by: Danise Edge on: 06/22/2010 12:37 PM   Modules accepted: Orders

## 2010-06-23 ENCOUNTER — Telehealth: Payer: Self-pay

## 2010-06-23 LAB — URINE CULTURE: Colony Count: >=100000

## 2010-06-23 MED ORDER — CEFDINIR 300 MG PO CAPS: 300.0000 mg | ORAL_CAPSULE | Freq: Two times a day (BID) | ORAL | Status: DC

## 2010-06-23 MED ORDER — CIPROFLOXACIN HCL 500 MG PO TABS: 500.0000 mg | ORAL_TABLET | Freq: Two times a day (BID) | ORAL | Status: DC

## 2010-06-23 NOTE — Telephone Encounter (Signed)
Per MD urine is growing E Coli. Send in Cipro 500 bid X 7 days. Pt informed and RX sent to [facility]

## 2010-06-23 NOTE — Telephone Encounter (Signed)
Pt would like to know if her urine culture results came in? Pt states she is in a lot of pain and knows she has a UTI. Pt would like something called into her pharmacy.

## 2010-06-23 NOTE — Progress Notes (Signed)
Addended by: Josph Macho on: 06/23/2010 04:46 PM   Modules accepted: Orders

## 2010-06-29 ENCOUNTER — Other Ambulatory Visit: Payer: Self-pay

## 2010-06-29 MED ORDER — VALACYCLOVIR HCL 1 G PO TABS: 1000.0000 mg | ORAL_TABLET | Freq: Every day | ORAL | Status: DC

## 2010-06-30 NOTE — Progress Notes (Signed)
I cannot seem to pull up the GC/Chlamydia probe results, can you track them down please?

## 2010-06-30 NOTE — Progress Notes (Signed)
Unable to access the RPR results, can you?

## 2010-07-01 ENCOUNTER — Other Ambulatory Visit: Payer: Self-pay

## 2010-07-01 MED ORDER — CLINDAMYCIN PHOSPHATE 2 % VA CREA: 1.0000 | TOPICAL_CREAM | Freq: Every day | VAGINAL | Status: DC

## 2010-07-20 ENCOUNTER — Ambulatory Visit (INDEPENDENT_AMBULATORY_CARE_PROVIDER_SITE_OTHER): Payer: 59 | Admitting: Family Medicine

## 2010-07-20 ENCOUNTER — Encounter: Payer: Self-pay | Admitting: Family Medicine

## 2010-07-20 VITALS — BP 148/97 | HR 75 | Temp 97.4°F | Ht 64.0 in | Wt 158.1 lb

## 2010-07-20 DIAGNOSIS — Z7251 High risk heterosexual behavior: Secondary | ICD-10-CM

## 2010-07-20 DIAGNOSIS — F1010 Alcohol abuse, uncomplicated: Secondary | ICD-10-CM

## 2010-07-20 DIAGNOSIS — F32A Depression, unspecified: Secondary | ICD-10-CM

## 2010-07-20 DIAGNOSIS — I10 Essential (primary) hypertension: Secondary | ICD-10-CM

## 2010-07-20 DIAGNOSIS — F329 Major depressive disorder, single episode, unspecified: Secondary | ICD-10-CM

## 2010-07-20 MED ORDER — VILAZODONE HCL 20 MG PO TABS: 1.0000 | ORAL_TABLET | Freq: Every day | ORAL | Status: DC

## 2010-07-20 MED ORDER — BUPROPION HCL 75 MG PO TABS: 75.0000 mg | ORAL_TABLET | Freq: Every day | ORAL | Status: DC

## 2010-07-20 NOTE — Patient Instructions (Signed)
Depression You have signs of depression. This is a common problem. It can occur at any age. It is often hard to recognize. People can suffer from depression and still have moments of enjoyment. Depression interferes with your basic ability to function in life. It upsets your relationships, sleep, eating, and work habits. CAUSES Depression is believed to be caused by an imbalance in brain chemicals. It may be triggered by an unpleasant event. Relationship crises, a death in the family, financial worries, retirement, or other stressors are normal causes of depression. Depression may also start for no known reason. Other factors that may play a part include medical illnesses, some medicines, genetics, and alcohol or drug abuse. SYMPTOMS  Feeling unhappy or worthless.   Long-lasting (chronic) tiredness or worn-out feeling.   Self-destructive thoughts and actions.   Not being able to sleep or sleeping too much.   Eating more than usual or not eating at all.   Headaches or feeling anxious.   Trouble concentrating or making decisions.   Unexplained physical problems and substance abuse.  TREATMENT Depression usually gets better with treatment. This can include:  Antidepressant medicines. It can take weeks before the proper dose is achieved and benefits are reached.   Talking with a therapist, clergyperson, counselor, or friend. These people can help you gain insight into your problem and regain control of your life.   Eating a good diet.   Getting regular physical exercise, such as walking for 30 minutes every day.   Not abusing alcohol or drugs.  Treating depression often takes 6 months or longer. This length of treatment is needed to keep symptoms from returning. Call your caregiver and arrange for follow-up care as suggested. SEEK IMMEDIATE MEDICAL CARE IF:  You start to have thoughts of hurting yourself or others.   Call your local emergency services (911 in [place]   Go to your  local medical emergency department.   Call the National Suicide Prevention Lifeline: 1-800-273-TALK (1-800-273-8255).  Document Released: 01/17/2005 Document Re-Released: 07/07/2009 [employer] Patient Information 2011 [employer]. 

## 2010-07-20 NOTE — Assessment & Plan Note (Signed)
Patient is fallen into a deeper depression and is struggling with anhedonia and fatigue. She still continues to work but has more trouble making herself go. She is finding frequently with her husband at home. She wants to the list of antidepressant she's tried in the past did not like the way they make her feel and that included Prozac, Zoloft, Lexapro, Celexa, Cymbalta, Paxil. He said they often made her feel a lot worse more irritable and crazy. She has denied a manic type behavior but in the past but does engage in risky sexual behavior and excessive alcohol binges at times. We give her a bipolar screen today and she does test positive. The mood disorder questionnaire ranked positive at 7/13 positive responses and she had 10/13. Question #2 she answered yes which is also a positive response and a question of a tree she altered moderate which is also a positive response.chief self stenosis this may be the patient for her she agrees to start vibrate at 10 mg a day this week and we will see her in follow up next week and consider a mood stabilizer in addition such as Abilify or Seroquel. She has agreed to counseling and will consider psychiatry if needed

## 2010-07-20 NOTE — Progress Notes (Signed)
Cheryl Taylor, [place] [id number] 1965/12/22 07/20/2010      Progress Note-Follow Up  Subjective  Chief Complaint  Chief Complaint  Patient presents with  . Follow-up    1 month follow up    HPI  Pateint is a 45 year old female who is in today for follow up and notes a worsening of her depression and drinking in the past weeks. He continues to work at his 73 today again and is having trouble of anhedonia, fatigue and motivation to get to work. She is finding difficult with her husband again. She notes that she gets like this her husband starts to get irritable [place] she will soon not we will contribute to the household and he will be having to do won't work. She is aware that she needs some help but is frustrated about what to finance. She denies ever being told she might have bipolar disorder. She's tried Prozac, Zoloft, Lexapro, Celexa, Cymbalta and Paxil all of which caused her more difficulty than help. She unfortunately continues to drink her wine and take her Ativan multiple times a day she feels like the food in the future, but she is warned that it also leads to worsening depression. No acute illness, fevers, chills, congestion, chest pain, palpitations, shortness of breath, GI or GU complaints since her last visit.  Past Medical History  Diagnosis Date  . Depression   . Anxiety   . Tachycardia 04/20/2010  . Alcohol abuse 04/20/2010  . Vitamin D deficiency 04/20/2010  . HTN (hypertension) 06/07/2010  . Chest pain, atypical 06/07/2010  . Diarrhea 06/21/2010  . High risk sexual behavior 06/21/2010    Past Surgical History  Procedure Date  . Cesarean section 2003  . Tonsillectomy 1980    Family History  Problem Relation Age of Onset  . Kidney failure Father 41    deceased  . Kidney disease Father   . Obesity Father   . Seizures Father   . Heart disease Father 76  . Schizophrenia Brother     52yo  . Alcohol abuse Brother   . Other Brother     chronic pain, 17 yo  . Alcohol  abuse Sister     74yo  . Obesity Paternal Grandfather     History   Social History  . Marital Status: Married    Spouse Name: N/A    Number of Children: N/A  . Years of Education: N/A   Occupational History  . Not on file.   Social History Main Topics  . Smoking status: Never Smoker   . Smokeless tobacco: Never Used  . Alcohol Use: 30.0 oz/week    50 Glasses of wine per week     2 bottle a night X 7 days/ pt quit on 04/19/10- pt is still drinking-  1 bottle at night for about 2 weeks  . Drug Use: No  . Sexually Active: Yes -- Female partner(s)    Birth Control/ Protection: Pill   Other Topics Concern  . Not on file   Social History Narrative  . No narrative on file    Current Outpatient Prescriptions on File Prior to Visit  Medication Sig Dispense Refill  . LO LOESTRIN FE 1 MG-10 MCG / 10 MCG TABS Take by mouth Daily.      Marland Kitchen LORazepam (ATIVAN) 1 MG tablet 1-2 tabs po tid prn anxiety/alcohol withdrawal  100 tablet  1  . metoprolol (TOPROL-XL) 50 MG 24 hr tablet Take 1 tablet (50 mg total) by  mouth daily.  30 tablet  11  . valACYclovir (VALTREX) 1000 MG tablet Take 1 tablet (1,000 mg total) by mouth daily.  90 tablet  3  . DISCONTD: buPROPion (WELLBUTRIN) 75 MG tablet Take 1 tablet (75 mg total) by mouth 2 (two) times daily.  60 tablet  2  . DISCONTD: cefdinir (OMNICEF) 300 MG capsule Take 1 capsule (300 mg total) by mouth 2 (two) times daily. X 7 days  14 capsule  0  . DISCONTD: clindamycin (CLEOCIN) 2 % vaginal cream Place 1 Applicatorful vaginally at bedtime. Apply pv qhs 1 applicator daily X 7 days  40 g  0    No Known Allergies  Review of Systems  Review of Systems  Constitutional: Negative for fever and malaise/fatigue.  HENT: Negative for congestion.   Eyes: Negative for discharge.  Respiratory: Negative for shortness of breath.   Cardiovascular: Negative for chest pain, palpitations and leg swelling.  Gastrointestinal: Negative for nausea, abdominal pain and  diarrhea.  Genitourinary: Negative for dysuria.  Musculoskeletal: Negative for falls.  Skin: Negative for rash.  Neurological: Negative for loss of consciousness and headaches.  Endo/Heme/Allergies: Negative for polydipsia.  Psychiatric/Behavioral: Positive for depression and substance abuse. Negative for suicidal ideas and hallucinations. The patient is nervous/anxious and has insomnia.     Objective  BP 148/97  Pulse 75  Temp(Src) 97.4 F (36.3 C) (Oral)  Ht 5\' 4"  (1.626 m)  Wt 158 lb 1.9 oz (71.723 kg)  BMI 27.14 kg/m2  SpO2 100%  Physical Exam  Physical Exam  Constitutional: She is oriented to person, place, and time and well-developed, well-nourished, and in no distress. No distress.  HENT:  Head: Normocephalic and atraumatic.  Eyes: Conjunctivae are normal.  Neck: Neck supple. No thyromegaly present.  Cardiovascular: Normal rate, regular rhythm and normal heart sounds.   No murmur heard. Pulmonary/Chest: Effort normal and breath sounds normal. She has no wheezes.  Abdominal: She exhibits no distension and no mass.  Musculoskeletal: She exhibits no edema.  Lymphadenopathy:    She has no cervical adenopathy.  Neurological: She is alert and oriented to person, place, and time.  Skin: Skin is warm and dry. No rash noted. She is not diaphoretic.  Psychiatric: Memory, affect and judgment normal.    Lab Results  Component Value Date   TSH 1.09 06/21/2010   Lab Results  Component Value Date   WBC 14.8* 06/21/2010   HGB 13.0 06/21/2010   HCT 39.1 06/21/2010   MCV 92.9 06/21/2010   PLT 294.0 06/21/2010   Lab Results  Component Value Date   CREATININE 0.8 06/21/2010   BUN 15 06/21/2010   NA 136 06/21/2010   K 4.0 06/21/2010   CL 103 06/21/2010   CO2 24 06/21/2010   Lab Results  Component Value Date   ALT 18 06/21/2010   AST 23 06/21/2010   ALKPHOS 35* 06/21/2010   BILITOT 0.1* 06/21/2010   Lab Results  Component Value Date   CHOL 135 06/21/2010   Lab Results    Component Value Date   HDL 50.70 06/21/2010   Lab Results  Component Value Date   LDLCALC 61 06/21/2010   Lab Results  Component Value Date   TRIG 119.0 06/21/2010   Lab Results  Component Value Date   CHOLHDL 3 06/21/2010     Assessment & Plan  HTN (hypertension) Mildly elevated today patient very anxious today will continue with the Metoprolol XL 50mg  for now see her next week in follow up  High risk sexual behavior Asymptomatic and testing negative, encouraged less risky behavior in the future.  Alcohol abuse Unfortunately she a great deal of stress at work and home and has started drinking again. That appointment and continues to take her Ativan regularly. She agrees to try and cut back once again. She is asked to drop to three quarters of a bottle from a bottle of wine nightly tonight and to drop her Ativan to no more than 4 a day, reassess next week. Will try and make changes every 3 days call if she has any concerns.  Depression Patient is fallen into a deeper depression and is struggling with anhedonia and fatigue. She still continues to work but has more trouble making herself go. She is finding frequently with her husband at home. She wants to the list of antidepressant she's tried in the past did not like the way they make her feel and that included Prozac, Zoloft, Lexapro, Celexa, Cymbalta, Paxil. He said they often made her feel a lot worse more irritable and crazy. She has denied a manic type behavior but in the past but does engage in risky sexual behavior and excessive alcohol binges at times. We give her a bipolar screen today and she does test positive. The mood disorder questionnaire ranked positive at 7/13 positive responses and she had 10/13. Question #2 she answered yes which is also a positive response and a question of a tree she altered moderate which is also a positive response.chief self stenosis this may be the patient for her she agrees to start vibrate at 10  mg a day this week and we will see her in follow up next week and consider a mood stabilizer in addition such as Abilify or Seroquel. She has agreed to counseling and will consider psychiatry if needed

## 2010-07-20 NOTE — Assessment & Plan Note (Signed)
Unfortunately she a great deal of stress at work and home and has started drinking again. That appointment and continues to take her Ativan regularly. She agrees to try and cut back once again. She is asked to drop to three quarters of a bottle from a bottle of wine nightly tonight and to drop her Ativan to no more than 4 a day, reassess next week. Will try and make changes every 3 days call if she has any concerns.

## 2010-07-20 NOTE — Assessment & Plan Note (Signed)
Asymptomatic and testing negative, encouraged less risky behavior in the future.

## 2010-07-20 NOTE — Assessment & Plan Note (Signed)
Mildly elevated today patient very anxious today will continue with the Metoprolol XL 50mg  for now see her next week in follow up

## 2010-07-27 ENCOUNTER — Ambulatory Visit: Payer: 59 | Admitting: Family Medicine

## 2010-07-27 ENCOUNTER — Ambulatory Visit (INDEPENDENT_AMBULATORY_CARE_PROVIDER_SITE_OTHER): Payer: 59 | Admitting: Licensed Clinical Social Worker

## 2010-07-27 DIAGNOSIS — F3189 Other bipolar disorder: Secondary | ICD-10-CM

## 2010-07-28 ENCOUNTER — Ambulatory Visit (INDEPENDENT_AMBULATORY_CARE_PROVIDER_SITE_OTHER): Payer: 59 | Admitting: Family Medicine

## 2010-07-28 ENCOUNTER — Encounter: Payer: Self-pay | Admitting: Family Medicine

## 2010-07-28 DIAGNOSIS — F1010 Alcohol abuse, uncomplicated: Secondary | ICD-10-CM

## 2010-07-28 DIAGNOSIS — F3289 Other specified depressive episodes: Secondary | ICD-10-CM

## 2010-07-28 DIAGNOSIS — F329 Major depressive disorder, single episode, unspecified: Secondary | ICD-10-CM

## 2010-07-28 DIAGNOSIS — I10 Essential (primary) hypertension: Secondary | ICD-10-CM

## 2010-07-28 DIAGNOSIS — F32A Depression, unspecified: Secondary | ICD-10-CM

## 2010-07-28 NOTE — Patient Instructions (Signed)

## 2010-07-28 NOTE — Assessment & Plan Note (Signed)
Continues to drink intermittently.  She is caused long did not drink any alcohol yesterday and so far today but did drink over the weekend. She is encouraged to continue trying to control her excessive consumption and she will followup with Dr. Derrill Kay regarding further options for cessationas 3 days without alcohol since her last visit but then will drink 2 or 3 large glasses of wine a day.

## 2010-07-28 NOTE — Assessment & Plan Note (Signed)
Patient tested positive for bipolar screen at last visit, and last night she somewhat overweight behavioral health, Judithe Modest and they are in agreement that she is likely bipolar. For now she believes the Bupropion is helping her and she has been referred to Dr. Jules Schick in psychiatry for further management so we will not make any further changes in medications at this time. At present patient is using the brace Pam 1 mg when necessary in the morning but has not taken it today and then takes 2 at night to help her rest we will leave those medications intact at this time

## 2010-07-28 NOTE — Assessment & Plan Note (Signed)
Pressure is up some today secondary to no alcohol for the past 36 plus hours, she is encouraged to continue the Metoprolol at 50 mg for now and she will monitor her BP closely. She will notify us if it continues to trend up or she becomes symptomatic

## 2010-07-28 NOTE — Progress Notes (Signed)
Cheryl Taylor [id number] 1966/01/21 07/28/2010      Progress Note-Follow Up  Subjective  Chief Complaint  Chief Complaint  Patient presents with  . Depression    stopped Viibryd d/t side effects, saw counselor on 6/26, seeing psychiatrist next week  . Hypertension    HPI  Patient is a 45 year old Caucasian female with a long history of depression and excessive alcohol drinking. At her last visit her bipolar screen was positive. She did take our advice and call with our behavioral health consult as above last night. Patient reports they are in agreement that she struggles with bipolar disorder. Overall patient feels the bupropion is stable after some period she can go up to 3 days without drinking alcohol but then becomes very anxious and drinks again. She does not have alcohol at present in almost 2 days time. She did not have any yesterday nor any today. Did drink Saturday and "Sunday on the weekends. She denies any headaches, fevers, chills, chest pain, palpitations, shortness of breath, GI or GU complaints. She did not taking Ativan this morning normally she'll take roughly one in the a.m. and 2 at bedtime to help her rest. She does take her metoprolol daily around noon.  Past Medical History  Diagnosis Date  . Depression   . Anxiety   . Tachycardia 04/20/2010  . Alcohol abuse 04/20/2010  . Vitamin D deficiency 04/20/2010  . HTN (hypertension) 06/07/2010  . Chest pain, atypical 06/07/2010  . Diarrhea 06/21/2010  . High risk sexual behavior 06/21/2010    Past Surgical History  Procedure Date  . Cesarean section 2003  . Tonsillectomy 1980    Family History  Problem Relation Age of Onset  . Kidney failure Father 78    deceased  . Kidney disease Father   . Obesity Father   . Seizures Father   . Heart disease Father 78  . Schizophrenia Brother     52yo  . Alcohol abuse Brother   . Other Brother     chronic pain, 50 yo  . Alcohol abuse Sister     45" yo  . Obesity Paternal  Grandfather     History   Social History  . Marital Status: Married    Spouse Name: N/A    Number of Children: N/A  . Years of Education: N/A   Occupational History  . Not on file.   Social History Main Topics  . Smoking status: Never Smoker   . Smokeless tobacco: Never Used  . Alcohol Use: 30.0 oz/week    50 Glasses of wine per week     2 bottle a night X 7 days/ pt quit on 04/19/10- pt is still drinking-  1 bottle at night for about 2 weeks  . Drug Use: No  . Sexually Active: Yes -- Female partner(s)    Birth Control/ Protection: Pill   Other Topics Concern  . Not on file   Social History Narrative  . No narrative on file    Current Outpatient Prescriptions on File Prior to Visit  Medication Sig Dispense Refill  . LO LOESTRIN FE 1 MG-10 MCG / 10 MCG TABS Take by mouth Daily.      Marland Kitchen LORazepam (ATIVAN) 1 MG tablet 1-2 tabs po tid prn anxiety/alcohol withdrawal  100 tablet  1  . metoprolol (TOPROL-XL) 50 MG 24 hr tablet Take 1 tablet (50 mg total) by mouth daily.  30 tablet  11  . valACYclovir (VALTREX) 1000 MG tablet Take 1 tablet (1,000 mg  total) by mouth daily.  90 tablet  3  . DISCONTD: buPROPion (WELLBUTRIN) 75 MG tablet Take 1 tablet (75 mg total) by mouth daily.  60 tablet  2  . DISCONTD: Vilazodone HCl (VIIBRYD) 20 MG TABS Take 1 tablet by mouth daily.  30 tablet  0    No Known Allergies  Review of Systems  Review of Systems  Constitutional: Negative for fever and malaise/fatigue.  HENT: Negative for congestion.   Eyes: Negative for discharge.  Respiratory: Negative for shortness of breath.   Cardiovascular: Negative for chest pain, palpitations and leg swelling.  Gastrointestinal: Negative for nausea, abdominal pain and diarrhea.  Genitourinary: Negative for dysuria.  Musculoskeletal: Negative for falls.  Skin: Negative for rash.  Neurological: Negative for loss of consciousness and headaches.  Endo/Heme/Allergies: Negative for polydipsia.    Psychiatric/Behavioral: Positive for depression and substance abuse. Negative for suicidal ideas. The patient is nervous/anxious and has insomnia.     Objective  BP 150/94  Pulse 75  Temp(Src) 98.4 F (36.9 C) (Oral)  Ht 5\' 4"  (1.626 m)  Wt 160 lb (72.576 kg)  BMI 27.46 kg/m2  SpO2 99%  Physical Exam  Physical Exam  Constitutional: She is oriented to person, place, and time and well-developed, well-nourished, and in no distress. No distress.  HENT:  Head: Normocephalic and atraumatic.  Eyes: Conjunctivae are normal.  Neck: Neck supple. No thyromegaly present.  Cardiovascular: Normal rate, regular rhythm and normal heart sounds.   No murmur heard. Pulmonary/Chest: Effort normal and breath sounds normal. She has no wheezes.  Abdominal: She exhibits no distension and no mass.  Musculoskeletal: She exhibits no edema.  Lymphadenopathy:    She has no cervical adenopathy.  Neurological: She is alert and oriented to person, place, and time.  Skin: Skin is warm and dry. No rash noted. She is not diaphoretic.  Psychiatric: Memory, affect and judgment normal.    Lab Results  Component Value Date   TSH 1.09 06/21/2010   Lab Results  Component Value Date   WBC 14.8* 06/21/2010   HGB 13.0 06/21/2010   HCT 39.1 06/21/2010   MCV 92.9 06/21/2010   PLT 294.0 06/21/2010   Lab Results  Component Value Date   CREATININE 0.8 06/21/2010   BUN 15 06/21/2010   NA 136 06/21/2010   K 4.0 06/21/2010   CL 103 06/21/2010   CO2 24 06/21/2010   Lab Results  Component Value Date   ALT 18 06/21/2010   AST 23 06/21/2010   ALKPHOS 35* 06/21/2010   BILITOT 0.1* 06/21/2010   Lab Results  Component Value Date   CHOL 135 06/21/2010   Lab Results  Component Value Date   HDL 50.70 06/21/2010   Lab Results  Component Value Date   LDLCALC 61 06/21/2010   Lab Results  Component Value Date   TRIG 119.0 06/21/2010   Lab Results  Component Value Date   CHOLHDL 3 06/21/2010     Assessment &  Plan  Depression Patient tested positive for bipolar screen at last visit, and last night she somewhat overweight behavioral health, Judithe Modest and they are in agreement that she is likely bipolar. For now she believes the Bupropion is helping her and she has been referred to Dr. Jules Schick in psychiatry for further management so we will not make any further changes in medications at this time. At present patient is using the brace Pam 1 mg when necessary in the morning but has not taken it today and  then takes 2 at night to help her rest we will leave those medications intact at this time   Alcohol abuse Continues to drink intermittently.  She is caused long did not drink any alcohol yesterday and so far today but did drink over the weekend. She is encouraged to continue trying to control her excessive consumption and she will followup with Dr. Derrill Kay regarding further options for cessationas 3 days without alcohol since her last visit but then will drink 2 or 3 large glasses of wine a day.  HTN (hypertension) Pressure is up some today secondary to no alcohol for the past 36 plus hours, she is encouraged to continue the Metoprolol at 50 mg for now and she will monitor her BP closely. She will notify us if it continues to trend up or she becomes symptomatic

## 2010-08-10 ENCOUNTER — Ambulatory Visit: Payer: 59 | Admitting: Licensed Clinical Social Worker

## 2010-08-13 ENCOUNTER — Other Ambulatory Visit: Payer: Self-pay | Admitting: Family Medicine

## 2010-08-13 DIAGNOSIS — F10239 Alcohol dependence with withdrawal, unspecified: Secondary | ICD-10-CM

## 2010-08-13 MED ORDER — LORAZEPAM 1 MG PO TABS: ORAL_TABLET | ORAL | Status: DC

## 2010-08-13 NOTE — Telephone Encounter (Signed)
Patient wants a refill on her ativan.

## 2010-08-17 ENCOUNTER — Ambulatory Visit (INDEPENDENT_AMBULATORY_CARE_PROVIDER_SITE_OTHER): Payer: 59 | Admitting: Licensed Clinical Social Worker

## 2010-08-17 DIAGNOSIS — F3189 Other bipolar disorder: Secondary | ICD-10-CM

## 2010-08-18 ENCOUNTER — Ambulatory Visit: Payer: 59 | Admitting: Family Medicine

## 2010-08-25 ENCOUNTER — Ambulatory Visit: Payer: 59 | Admitting: Family Medicine

## 2010-08-26 ENCOUNTER — Ambulatory Visit: Payer: 59 | Admitting: Licensed Clinical Social Worker

## 2010-09-02 ENCOUNTER — Ambulatory Visit (INDEPENDENT_AMBULATORY_CARE_PROVIDER_SITE_OTHER): Payer: 59 | Admitting: Licensed Clinical Social Worker

## 2010-09-02 DIAGNOSIS — F411 Generalized anxiety disorder: Secondary | ICD-10-CM

## 2010-09-02 DIAGNOSIS — F331 Major depressive disorder, recurrent, moderate: Secondary | ICD-10-CM

## 2010-09-06 ENCOUNTER — Ambulatory Visit: Payer: 59 | Admitting: Family Medicine

## 2010-09-09 ENCOUNTER — Ambulatory Visit: Payer: 59 | Admitting: Licensed Clinical Social Worker

## 2010-09-13 ENCOUNTER — Other Ambulatory Visit: Payer: Self-pay | Admitting: Family Medicine

## 2010-09-13 DIAGNOSIS — I10 Essential (primary) hypertension: Secondary | ICD-10-CM

## 2010-09-13 MED ORDER — METOPROLOL SUCCINATE ER 50 MG PO TB24: 50.0000 mg | ORAL_TABLET | Freq: Every day | ORAL | Status: DC

## 2010-09-13 NOTE — Telephone Encounter (Signed)
90 day RX sent to [facility].  Pt notified.

## 2010-09-13 NOTE — Telephone Encounter (Signed)
Patient wants refill meds changed to [facility].

## 2010-09-22 ENCOUNTER — Encounter: Payer: Self-pay | Admitting: Family Medicine

## 2010-09-22 ENCOUNTER — Ambulatory Visit (INDEPENDENT_AMBULATORY_CARE_PROVIDER_SITE_OTHER): Payer: 59 | Admitting: Family Medicine

## 2010-09-22 DIAGNOSIS — F32A Depression, unspecified: Secondary | ICD-10-CM

## 2010-09-22 DIAGNOSIS — IMO0001 Reserved for inherently not codable concepts without codable children: Secondary | ICD-10-CM

## 2010-09-22 DIAGNOSIS — F10239 Alcohol dependence with withdrawal, unspecified: Secondary | ICD-10-CM

## 2010-09-22 DIAGNOSIS — F329 Major depressive disorder, single episode, unspecified: Secondary | ICD-10-CM

## 2010-09-22 DIAGNOSIS — F1010 Alcohol abuse, uncomplicated: Secondary | ICD-10-CM

## 2010-09-22 DIAGNOSIS — K219 Gastro-esophageal reflux disease without esophagitis: Secondary | ICD-10-CM

## 2010-09-22 DIAGNOSIS — I10 Essential (primary) hypertension: Secondary | ICD-10-CM

## 2010-09-22 DIAGNOSIS — G4700 Insomnia, unspecified: Secondary | ICD-10-CM

## 2010-09-22 MED ORDER — TEMAZEPAM 15 MG PO CAPS: 15.0000 mg | ORAL_CAPSULE | Freq: Every evening | ORAL | Status: DC | PRN

## 2010-09-22 MED ORDER — RANITIDINE HCL 150 MG PO TABS: 150.0000 mg | ORAL_TABLET | Freq: Two times a day (BID) | ORAL | Status: DC | PRN

## 2010-09-22 MED ORDER — LORAZEPAM 1 MG PO TABS: ORAL_TABLET | ORAL | Status: DC

## 2010-09-22 NOTE — Patient Instructions (Signed)
Heartburn Heartburn is a painful, burning sensation in the chest. It may feel worse in certain positions, such as lying down or bending over. It is caused by stomach acid backing up into the tube that carries food from the mouth down to the stomach (lower esophagus).  CAUSES A number of conditions can cause or worsen heartburn, including:   Pregnancy.   Being overweight (obesity).   A condition called hiatal hernia, in which part or all of the stomach is moved up into the chest through a weakness in the diaphragm muscle.   Alcohol.   Exercise.   Eating just before going to bed.   Overeating.   Medications, including:   Nonsteroidal anti-inflammatory drugs, such as ibuprofen and naproxen.   Aspirin.   Some blood pressure medicines, including beta-blockers, calcium channel blockers, and alpha-blockers.   Nitrates (used to treat angina).   The asthma medication Theophylline.   Certain sedative drugs.   Heartburn may be worse after eating certain foods. These heartburn-causing foods are different for different people, but may include:   Peppers.   Chocolate.   Coffee.   High-fat foods, including fried foods.   Spicy foods.   Garlic, onions.   Citrus fruits, including oranges, grapefruit, lemons and limes.   Food containing tomatoes or tomato products.   Mint.   Carbonated beverages.   Vinegar.  SYMPTOMS  Symptoms may last for a few minutes or a few hours, and can include:  Burning pain in the chest or lower throat.   Bitter taste in the mouth.   Coughing.  DIAGNOSIS If the usual treatments for heartburn do not improve your symptoms, then tests may be done to see if there is another condition present. Possible tests may include:  X-rays.   Endoscopy. This is when a tube with a light and a camera on the end is used to examine the esophagus and the stomach.   Blood, breath, or stool tests may be used to check for bacteria that cause ulcers.   TREATMENT There are a number of non-prescription medicines used to treat heartburn, including:  Antacids.   Acid reducers (also called H-2 blockers).   Proton-pump inhibitors.  HOME CARE INSTRUCTIONS  Raise the head of your bed by putting blocks under the legs.   Eat 2-3 hours before going to bed.   Stop smoking.   Try to reach and maintain a healthy weight.   Do not eat just a few very large meals. Instead, eat many smaller meals throughout the day.   Try to identify foods and beverages that make your symptoms worse, and avoid these.   Avoid tight clothing.   Do not exercise right after eating.  SEEK IMMEDIATE MEDICAL CARE IF YOU:  Have severe chest pain that goes down your arm, or into your jaw or neck.   Feel sweaty, dizzy, or lightheaded.   Are short of breath.   Throw up (vomit) blood.   Have difficulty or pain with swallowing.   Have bloody or black, tarry stools.   Have bouts of heartburn more than three times a week for more than two weeks.  Document Released: 06/05/2008 Document Re-Released: 04/13/2009 [facility] Patient Information 2011 [place], [place].   Try a probiotic Align caps daily for next month

## 2010-09-27 NOTE — Assessment & Plan Note (Signed)
Improved control, tolerating the Toprol and her OB/GYN recently switched her to a Progesterone only OCP and she believes that has helped as well. No change to therapy at this time.

## 2010-09-27 NOTE — Assessment & Plan Note (Signed)
Feels she is stabilized on the Wellbutrin and does not want to alter any doses at this time,

## 2010-09-27 NOTE — Assessment & Plan Note (Signed)
She has cut down again on her alcohol consumption and reports she feels more in control of her life, does not wish to attempt further decrease at this time. Will continue to monitor

## 2010-09-27 NOTE — Progress Notes (Signed)
Cheryl Taylor [id number] 1965/11/10 09/27/2010      Progress Note-Follow Up  Subjective  Chief Complaint  Chief Complaint  Patient presents with  . Discuss medication    HPI  She is a 45 year old Caucasian female who is in today for followup of multiple medical problems. Overall she feels she's doing better. She's recently got a promotion at work and things have settled at home. She feels less anxious and feels that is stable as well. Denies any suicidal or homicidal ideation. She has put that on her drinking and feels that her control again. She has recently also had her OB/GYN change her to a progesterone only OCP and her blood pressure has improved since we started Toprol and this was done. She's had no GYN complaints since her last visit denies any lesions or discharge. Denies any recent illness, fevers, chills, chest pain or palpitations, shortness of breath, GI or GU complaints.  Past Medical History  Diagnosis Date  . Depression   . Anxiety   . Tachycardia 04/20/2010  . Alcohol abuse 04/20/2010  . Vitamin D deficiency 04/20/2010  . HTN (hypertension) 06/07/2010  . Chest pain, atypical 06/07/2010  . Diarrhea 06/21/2010  . High risk sexual behavior 06/21/2010    Past Surgical History  Procedure Date  . Cesarean section 2003  . Tonsillectomy 1980    Family History  Problem Relation Age of Onset  . Kidney failure Father 34    deceased  . Kidney disease Father   . Obesity Father   . Seizures Father   . Heart disease Father 32  . Schizophrenia Brother     52yo  . Alcohol abuse Brother   . Other Brother     chronic pain, 59 yo  . Alcohol abuse Sister     27yo  . Obesity Paternal Grandfather     History   Social History  . Marital Status: Married    Spouse Name: N/A    Number of Children: N/A  . Years of Education: N/A   Occupational History  . Not on file.   Social History Main Topics  . Smoking status: Never Smoker   . Smokeless tobacco: Never Used  .  Alcohol Use: 30.0 oz/week    50 Glasses of wine per week     2 bottle a night X 7 days/ pt quit on 04/19/10- pt is still drinking-  1 bottle at night for about 2 weeks  . Drug Use: No  . Sexually Active: Yes -- Female partner(s)    Birth Control/ Protection: Pill   Other Topics Concern  . Not on file   Social History Narrative  . No narrative on file    Current Outpatient Prescriptions on File Prior to Visit  Medication Sig Dispense Refill  . buPROPion (WELLBUTRIN XL) 150 MG 24 hr tablet Take 150 mg by mouth daily.        . metoprolol (TOPROL-XL) 50 MG 24 hr tablet Take 1 tablet (50 mg total) by mouth daily.  90 tablet  2  . valACYclovir (VALTREX) 1000 MG tablet Take 1 tablet (1,000 mg total) by mouth daily.  90 tablet  3    No Known Allergies  Review of Systems  Review of Systems  Constitutional: Negative for fever and malaise/fatigue.  HENT: Negative for congestion.   Eyes: Negative for discharge.  Respiratory: Negative for shortness of breath.   Cardiovascular: Negative for chest pain, palpitations and leg swelling.  Gastrointestinal: Negative for nausea, abdominal pain and diarrhea.  Genitourinary: Negative for dysuria.  Musculoskeletal: Negative for falls.  Skin: Negative for rash.  Neurological: Negative for loss of consciousness and headaches.  Endo/Heme/Allergies: Negative for polydipsia.  Psychiatric/Behavioral: Negative for depression and suicidal ideas. The patient is not nervous/anxious and does not have insomnia.     Objective  BP 128/92  Pulse 75  Temp(Src) 98.6 F (37 C) (Oral)  Ht 5\' 4"  (1.626 m)  Wt 165 lb 6.4 oz (75.025 kg)  BMI 28.39 kg/m2  SpO2 99%  Physical Exam  Physical Exam  Constitutional: She is oriented to person, place, and time and well-developed, well-nourished, and in no distress. No distress.  HENT:  Head: Normocephalic and atraumatic.  Eyes: Conjunctivae are normal.  Neck: Neck supple. No thyromegaly present.  Cardiovascular:  Normal rate, regular rhythm and normal heart sounds.   No murmur heard. Pulmonary/Chest: Effort normal and breath sounds normal. She has no wheezes.  Abdominal: She exhibits no distension and no mass.  Musculoskeletal: She exhibits no edema.  Lymphadenopathy:    She has no cervical adenopathy.  Neurological: She is alert and oriented to person, place, and time.  Skin: Skin is warm and dry. No rash noted. She is not diaphoretic.  Psychiatric: Memory, affect and judgment normal.    Lab Results  Component Value Date   TSH 1.09 06/21/2010   Lab Results  Component Value Date   WBC 14.8* 06/21/2010   HGB 13.0 06/21/2010   HCT 39.1 06/21/2010   MCV 92.9 06/21/2010   PLT 294.0 06/21/2010   Lab Results  Component Value Date   CREATININE 0.8 06/21/2010   BUN 15 06/21/2010   NA 136 06/21/2010   K 4.0 06/21/2010   CL 103 06/21/2010   CO2 24 06/21/2010   Lab Results  Component Value Date   ALT 18 06/21/2010   AST 23 06/21/2010   ALKPHOS 35* 06/21/2010   BILITOT 0.1* 06/21/2010   Lab Results  Component Value Date   CHOL 135 06/21/2010   Lab Results  Component Value Date   HDL 50.70 06/21/2010   Lab Results  Component Value Date   LDLCALC 61 06/21/2010   Lab Results  Component Value Date   TRIG 119.0 06/21/2010   Lab Results  Component Value Date   CHOLHDL 3 06/21/2010     Assessment & Plan  Alcohol abuse She has cut down again on her alcohol consumption and reports she feels more in control of her life, does not wish to attempt further decrease at this time. Will continue to monitor  HTN (hypertension) Improved control, tolerating the Toprol and her OB/GYN recently switched her to a Progesterone only OCP and she believes that has helped as well. No change to therapy at this time.   Depression Feels she is stabilized on the Wellbutrin and does not want to alter any doses at this time,

## 2010-10-11 ENCOUNTER — Other Ambulatory Visit: Payer: Self-pay

## 2010-10-11 DIAGNOSIS — IMO0001 Reserved for inherently not codable concepts without codable children: Secondary | ICD-10-CM

## 2010-10-11 MED ORDER — RANITIDINE HCL 150 MG PO TABS: 150.0000 mg | ORAL_TABLET | Freq: Two times a day (BID) | ORAL | Status: DC | PRN

## 2010-10-11 NOTE — Telephone Encounter (Signed)
Patient left a message stating that she has been taking 2 of her Temazepam and its working great. Pt would like an RX for Temazepam 2 at night.

## 2010-10-11 NOTE — Telephone Encounter (Signed)
There is a 30 mg tab she can have Temazepam 30mg  1 tab po qhs prn insomnia, #30 without refill for now, if she wants to stay on this hi dose I should probably see and document SE and vital signs before this rx runs out

## 2010-10-12 MED ORDER — TEMAZEPAM 30 MG PO CAPS: 30.0000 mg | ORAL_CAPSULE | Freq: Every evening | ORAL | Status: DC | PRN

## 2010-10-12 NOTE — Telephone Encounter (Signed)
Left a detailed message on patients voicemail.

## 2010-11-08 ENCOUNTER — Encounter: Payer: Self-pay | Admitting: Family Medicine

## 2010-11-08 ENCOUNTER — Ambulatory Visit (INDEPENDENT_AMBULATORY_CARE_PROVIDER_SITE_OTHER): Payer: 59 | Admitting: Family Medicine

## 2010-11-08 DIAGNOSIS — F32A Depression, unspecified: Secondary | ICD-10-CM

## 2010-11-08 DIAGNOSIS — G4700 Insomnia, unspecified: Secondary | ICD-10-CM

## 2010-11-08 DIAGNOSIS — F10239 Alcohol dependence with withdrawal, unspecified: Secondary | ICD-10-CM

## 2010-11-08 DIAGNOSIS — I10 Essential (primary) hypertension: Secondary | ICD-10-CM

## 2010-11-08 DIAGNOSIS — F329 Major depressive disorder, single episode, unspecified: Secondary | ICD-10-CM

## 2010-11-08 DIAGNOSIS — F1010 Alcohol abuse, uncomplicated: Secondary | ICD-10-CM

## 2010-11-08 MED ORDER — CHLORTHALIDONE 25 MG PO TABS: 25.0000 mg | ORAL_TABLET | Freq: Every day | ORAL | Status: DC

## 2010-11-08 MED ORDER — LORAZEPAM 1 MG PO TABS: ORAL_TABLET | ORAL | Status: DC

## 2010-11-08 MED ORDER — TEMAZEPAM 22.5 MG PO CAPS: 22.5000 mg | ORAL_CAPSULE | Freq: Every evening | ORAL | Status: DC | PRN

## 2010-11-08 NOTE — Patient Instructions (Signed)

## 2010-11-08 NOTE — Assessment & Plan Note (Addendum)
Add Chlorthalidone 25mg  daily, avoid sodium, given the DASH diet. Increase exercise

## 2010-11-14 ENCOUNTER — Encounter: Payer: Self-pay | Admitting: Family Medicine

## 2010-11-14 DIAGNOSIS — G4700 Insomnia, unspecified: Secondary | ICD-10-CM | POA: Insufficient documentation

## 2010-11-14 NOTE — Assessment & Plan Note (Signed)
Is tolerating her Wellbutrin at increased doses, cont this with Ativan prn

## 2010-11-14 NOTE — Progress Notes (Signed)
Cheryl Taylor [id number] 1965/10/10 11/14/2010      Progress Note-Follow Up  Subjective  Chief Complaint  Chief Complaint  Patient presents with  . Follow-up    discuss medication    HPI  Patient is a 45 year old Caucasian female who is in today for followup on multiple medical problems. Overall she is doing well. Please her pulse in her blood pressure have been better controlled on her Toprol. She acknowledges feeling somewhat less anxious and tremulous. She is tolerating the increased dose of Wellbutrin helpful. Initially stop her Restoril caused nightmares but now does not believe that was the case when she restarted. No recent illness, fevers, chills, chest pain, palpitations, shortness of breath, GI or GU complaints. The Zantac is helping her reflux. She doesn't drink alcohol but much smaller amounts and much less frequently and feels that is better controlled. He is working at home but feels she is managing them fitted and she was at one point. Past Medical History  Diagnosis Date  . Depression   . Anxiety   . Tachycardia 04/20/2010  . Alcohol abuse 04/20/2010  . Vitamin D deficiency 04/20/2010  . HTN (hypertension) 06/07/2010  . Chest pain, atypical 06/07/2010  . Diarrhea 06/21/2010  . High risk sexual behavior 06/21/2010    Past Surgical History  Procedure Date  . Cesarean section 2003  . Tonsillectomy 1980    Family History  Problem Relation Age of Onset  . Kidney failure Father 76    deceased  . Kidney disease Father   . Obesity Father   . Seizures Father   . Heart disease Father 36  . Schizophrenia Brother     52yo  . Alcohol abuse Brother   . Other Brother     chronic pain, 74 yo  . Alcohol abuse Sister     51yo  . Obesity Paternal Grandfather     History   Social History  . Marital Status: Married    Spouse Name: N/A    Number of Children: N/A  . Years of Education: N/A   Occupational History  . Not on file.   Social History Main Topics  .  Smoking status: Never Smoker   . Smokeless tobacco: Never Used  . Alcohol Use: 30.0 oz/week    50 Glasses of wine per week     2 bottle a night X 7 days/ pt quit on 04/19/10- pt is still drinking-  1 bottle at night for about 2 weeks  . Drug Use: No  . Sexually Active: Yes -- Female partner(s)    Birth Control/ Protection: Pill   Other Topics Concern  . Not on file   Social History Narrative  . No narrative on file    Current Outpatient Prescriptions on File Prior to Visit  Medication Sig Dispense Refill  . buPROPion (WELLBUTRIN XL) 150 MG 24 hr tablet Take 150 mg by mouth daily.        . metoprolol (TOPROL-XL) 50 MG 24 hr tablet Take 1 tablet (50 mg total) by mouth daily.  90 tablet  2  . norethindrone (MICRONOR,CAMILA,ERRIN) 0.35 MG tablet Take 1 tablet by mouth daily.        . ranitidine (ZANTAC) 150 MG tablet Take 1 tablet (150 mg total) by mouth 2 (two) times daily as needed for heartburn.  60 tablet  1  . valACYclovir (VALTREX) 1000 MG tablet Take 1 tablet (1,000 mg total) by mouth daily.  90 tablet  3    No Known Allergies  Review of Systems  Review of Systems  Constitutional: Negative for fever and malaise/fatigue.  HENT: Negative for congestion.   Eyes: Negative for discharge.  Respiratory: Negative for shortness of breath.   Cardiovascular: Negative for chest pain, palpitations and leg swelling.  Gastrointestinal: Negative for nausea, abdominal pain and diarrhea.  Genitourinary: Negative for dysuria.  Musculoskeletal: Negative for falls.  Skin: Negative for rash.  Neurological: Negative for loss of consciousness and headaches.  Endo/Heme/Allergies: Negative for polydipsia.  Psychiatric/Behavioral: Positive for depression and substance abuse. Negative for suicidal ideas and hallucinations. The patient is nervous/anxious and has insomnia.     Objective  BP 148/96  Pulse 84  Temp(Src) 98.3 F (36.8 C) (Oral)  Ht 5\' 4"  (1.626 m)  Wt 168 lb 1.9 oz (76.259 kg)   BMI 28.86 kg/m2  SpO2 99%  Physical Exam  Physical Exam  Constitutional: She is well-developed, well-nourished, and in no distress. No distress.  HENT:  Head: Normocephalic and atraumatic.  Right Ear: External ear normal.  Left Ear: External ear normal.  Mouth/Throat: No oropharyngeal exudate.  Eyes: EOM are normal. Left eye exhibits no discharge. No scleral icterus.  Neck: Normal range of motion. Neck supple. No JVD present. No tracheal deviation present.  Cardiovascular: Normal heart sounds and intact distal pulses.   Pulmonary/Chest: No respiratory distress. She has no rales.  Abdominal: She exhibits no distension and no mass. There is tenderness. There is no guarding.  Musculoskeletal: She exhibits no edema and no tenderness.  Lymphadenopathy:    She has no cervical adenopathy.  Skin: No rash noted. No erythema.  Psychiatric: Mood, memory, affect and judgment normal.    Lab Results  Component Value Date   TSH 1.09 06/21/2010   Lab Results  Component Value Date   WBC 14.8* 06/21/2010   HGB 13.0 06/21/2010   HCT 39.1 06/21/2010   MCV 92.9 06/21/2010   PLT 294.0 06/21/2010   Lab Results  Component Value Date   CREATININE 0.8 06/21/2010   BUN 15 06/21/2010   NA 136 06/21/2010   K 4.0 06/21/2010   CL 103 06/21/2010   CO2 24 06/21/2010   Lab Results  Component Value Date   ALT 18 06/21/2010   AST 23 06/21/2010   ALKPHOS 35* 06/21/2010   BILITOT 0.1* 06/21/2010   Lab Results  Component Value Date   CHOL 135 06/21/2010   Lab Results  Component Value Date   HDL 50.70 06/21/2010   Lab Results  Component Value Date   LDLCALC 61 06/21/2010   Lab Results  Component Value Date   TRIG 119.0 06/21/2010   Lab Results  Component Value Date   CHOLHDL 3 06/21/2010     Assessment & Plan  HTN (hypertension) Add Chlorthalidone 25mg  daily, avoid sodium, given the DASH diet. Increase exercise  Insomnia She thought Restoril caused nightmares so she stopped it. Now she is not sure  that was the cause and she would like to retry it.  Alcohol abuse Has been doing much better with her intake lately and feels more in control of it at this time.  Depression Is tolerating her Wellbutrin at increased doses, cont this with Ativan prn

## 2010-11-14 NOTE — Assessment & Plan Note (Signed)
Has been doing much better with her intake lately and feels more in control of it at this time.

## 2010-11-14 NOTE — Assessment & Plan Note (Signed)
She thought Restoril caused nightmares so she stopped it. Now she is not sure that was the cause and she would like to retry it.

## 2010-11-22 ENCOUNTER — Encounter: Payer: Self-pay | Admitting: Family Medicine

## 2010-11-22 ENCOUNTER — Ambulatory Visit (INDEPENDENT_AMBULATORY_CARE_PROVIDER_SITE_OTHER): Payer: 59 | Admitting: Family Medicine

## 2010-11-22 VITALS — BP 128/85 | HR 85 | Temp 98.2°F | Ht 64.0 in | Wt 166.4 lb

## 2010-11-22 DIAGNOSIS — F1010 Alcohol abuse, uncomplicated: Secondary | ICD-10-CM

## 2010-11-22 DIAGNOSIS — R41840 Attention and concentration deficit: Secondary | ICD-10-CM

## 2010-11-22 DIAGNOSIS — F32A Depression, unspecified: Secondary | ICD-10-CM

## 2010-11-22 DIAGNOSIS — F329 Major depressive disorder, single episode, unspecified: Secondary | ICD-10-CM

## 2010-11-22 DIAGNOSIS — I10 Essential (primary) hypertension: Secondary | ICD-10-CM

## 2010-11-22 MED ORDER — AMPHETAMINE-DEXTROAMPHETAMINE 10 MG PO TABS: ORAL_TABLET | ORAL | Status: DC

## 2010-11-22 NOTE — Patient Instructions (Signed)
Attention Deficit Hyperactivity Disorder Attention deficit hyperactivity disorder (ADHD) is a problem with behavior issues based on the way the brain functions (neurobehavioral disorder). It is a common reason for behavior and academic problems in school. CAUSES  The cause of ADHD is unknown in most cases. It may run in families. It sometimes can be associated with learning disabilities and other behavioral problems. SYMPTOMS  There are 3 types of ADHD. The 3 types and some of the symptoms include:  Inattentive   Gets bored or distracted easily.   Loses or forgets things. Forgets to hand in homework.   Has trouble organizing or completing tasks.   Difficulty staying on task.   An inability to organize daily tasks and school work.   Leaving projects, chores, or homework unfinished.   Trouble paying attention or responding to details. Careless mistakes.   Difficulty following directions. Often seems like is not listening.   Dislikes activities that require sustained attention (like chores or homework).   Hyperactive-impulsive   Feels like it is impossible to sit still or stay in a seat. Fidgeting with hands and feet.   Trouble waiting turn.   Talking too much or out of turn. Interruptive.   Speaks or acts impulsively.   Aggressive, disruptive behavior.   Constantly busy or on the go, noisy.   Combined   Has symptoms of both of the above.  Often children with ADHD feel discouraged about themselves and with school. They often perform well below their abilities in school. These symptoms can cause problems in home, school, and in relationships with peers. As children get older, the excess motor activities can calm down, but the problems with paying attention and staying organized persist. Most children do not outgrow ADHD but with good treatment can learn to cope with the symptoms. DIAGNOSIS  When ADHD is suspected, the diagnosis should be made by professionals trained in  ADHD.  Diagnosis will include:  Ruling out other reasons for the child's behavior.   The caregivers will check with the child's school and check their medical records.   They will talk to teachers and parents.   Behavior rating scales for the child will be filled out by those dealing with the child on a daily basis.  A diagnosis is made only after all information has been considered. TREATMENT  Treatment usually includes behavioral treatment often along with medicines. It may include stimulant medicines. The stimulant medicines decrease impulsivity and hyperactivity and increase attention. Other medicines used include antidepressants and certain blood pressure medicines. Most experts agree that treatment for ADHD should address all aspects of the child's functioning. Treatment should not be limited to the use of medicines alone. Treatment should include structured classroom management. The parents must receive education to address rewarding good behavior, discipline, and limit-setting. Tutoring or behavioral therapy or both should be available for the child. If untreated, the disorder can have long-term serious effects into adolescence and adulthood. HOME CARE INSTRUCTIONS   Often with ADHD there is a lot of frustration among the family in dealing with the illness. There is often blame and anger that is not warranted. This is a life long illness. There is no way to prevent ADHD. In many cases, because the problem affects the family as a whole, the entire family may need help. A therapist can help the family find better ways to handle the disruptive behaviors and promote change. If the child is young, most of the therapist's work is with the parents. Parents will   learn techniques for coping with and improving their child's behavior. Sometimes only the child with the ADHD needs counseling. Your caregivers can help you make these decisions.   Children with ADHD may need help in organizing. Some  helpful tips include:   Keep routines the same every day from wake-up time to bedtime. Schedule everything. This includes homework and playtime. This should include outdoor and indoor recreation. Keep the schedule on the refrigerator or a bulletin board where it is frequently seen. Mark schedule changes as far in advance as possible.   Have a place for everything and keep everything in its place. This includes clothing, backpacks, and school supplies.   Encourage writing down assignments and bringing home needed books.   Offer your child a well-balanced diet. Breakfast is especially important for school performance. Children should avoid drinks with caffeine including:   Soft drinks.   Coffee.   Tea.   However, some older children (adolescents) may find these drinks helpful in improving their attention.   Children with ADHD need consistent rules that they can understand and follow. If rules are followed, give small rewards. Children with ADHD often receive, and expect, criticism. Look for good behavior and praise it. Set realistic goals. Give clear instructions. Look for activities that can foster success and self-esteem. Make time for pleasant activities with your child. Give lots of affection.   Parents are their children's greatest advocates. Learn as much as possible about ADHD. This helps you become a stronger and better advocate for your child. It also helps you educate your child's teachers and instructors if they feel inadequate in these areas. Parent support groups are often helpful. A national group with local chapters is called CHADD (Children and Adults with Attention Deficit Hyperactivity Disorder).  PROGNOSIS  There is no cure for ADHD. Children with the disorder seldom outgrow it. Many find adaptive ways to accommodate the ADHD as they mature. SEEK MEDICAL CARE IF:  Your child has repeated muscle twitches, cough or speech outbursts.   Your child has sleep problems.   Your  child has a marked loss of appetite.   Your child develops depression.   Your child has new or worsening behavioral problems.   Your child develops dizziness.   Your child has a racing heart.   Your child has stomach pains.   Your child develops headaches.  Document Released: 01/07/2002 Document Revised: 09/29/2010 Document Reviewed: 08/20/2007 [employer] Patient Information 2012 [employer]. 

## 2010-11-23 ENCOUNTER — Other Ambulatory Visit: Payer: Self-pay

## 2010-11-23 DIAGNOSIS — I10 Essential (primary) hypertension: Secondary | ICD-10-CM

## 2010-11-23 MED ORDER — CHLORTHALIDONE 25 MG PO TABS: 25.0000 mg | ORAL_TABLET | Freq: Every day | ORAL | Status: DC

## 2010-11-23 NOTE — Assessment & Plan Note (Signed)
Has cut back on consumption somewhat

## 2010-11-23 NOTE — Assessment & Plan Note (Signed)
She is in today to request a trial prescription of Adderall. She took a dose given to her friend and she reports it helped to calm her down during the day and she rested well that night.She reports great difficulty concentrating and it helped her to focus all day. She is allowed an rx of Adderall 10mg  po bid and we will reassess in 3 weeks. She agrees to discuss the possibility of ADd with her counselor Judithe Modest

## 2010-11-23 NOTE — Assessment & Plan Note (Signed)
Well controlled today, no changes to meds. 

## 2010-11-23 NOTE — Progress Notes (Signed)
Cheryl Taylor [id number] Nov 12, 1965 11/23/2010      Progress Note-Follow Up  Subjective  Chief Complaint  Chief Complaint  Patient presents with  . Follow-up    on medications    HPI  Patient is in today for evaluation of her ongoing depression and substance abuse. She is following closely with [facility] and is finding that helpful. She is requesting a prescription for Adderall. She tried a dose of a friend's and she notes that her alcohol consumption was much better that day. She was less anxious and shaky and she slept better. She says it helped her to concentrate that day and she realizes she may have had some trouble with concentrating and completing tasks for many years.  Past Medical History  Diagnosis Date  . Depression   . Anxiety   . Tachycardia 04/20/2010  . Alcohol abuse 04/20/2010  . Vitamin D deficiency 04/20/2010  . HTN (hypertension) 06/07/2010  . Chest pain, atypical 06/07/2010  . Diarrhea 06/21/2010  . High risk sexual behavior 06/21/2010    Past Surgical History  Procedure Date  . Cesarean section 2003  . Tonsillectomy 1980    Family History  Problem Relation Age of Onset  . Kidney failure Father 45    deceased  . Kidney disease Father   . Obesity Father   . Seizures Father   . Heart disease Father 56  . Schizophrenia Brother     52yo  . Alcohol abuse Brother   . Other Brother     chronic pain, 35 yo  . Alcohol abuse Sister     52yo  . Obesity Paternal Grandfather     History   Social History  . Marital Status: Married    Spouse Name: N/A    Number of Children: N/A  . Years of Education: N/A   Occupational History  . Not on file.   Social History Main Topics  . Smoking status: Never Smoker   . Smokeless tobacco: Never Used  . Alcohol Use: 30.0 oz/week    50 Glasses of wine per week     2 bottle a night X 7 days/ pt quit on 04/19/10- pt is still drinking-  1 bottle at night for about 2 weeks  . Drug Use: No  . Sexually  Active: Yes -- Female partner(s)    Birth Control/ Protection: Pill   Other Topics Concern  . Not on file   Social History Narrative  . No narrative on file    Current Outpatient Prescriptions on File Prior to Visit  Medication Sig Dispense Refill  . buPROPion (WELLBUTRIN XL) 150 MG 24 hr tablet Take 150 mg by mouth daily.        Marland Kitchen LORazepam (ATIVAN) 1 MG tablet 1-2 tabs po bid prn anxiety/alcohol withdrawal  90 tablet  1  . metoprolol (TOPROL-XL) 50 MG 24 hr tablet Take 1 tablet (50 mg total) by mouth daily.  90 tablet  2  . norethindrone (MICRONOR,CAMILA,ERRIN) 0.35 MG tablet Take 1 tablet by mouth daily.        . ranitidine (ZANTAC) 150 MG tablet Take 1 tablet (150 mg total) by mouth 2 (two) times daily as needed for heartburn.  60 tablet  1  . temazepam (RESTORIL) 22.5 MG capsule Take 1 capsule (22.5 mg total) by mouth at bedtime as needed for sleep.  30 capsule  0  . valACYclovir (VALTREX) 1000 MG tablet Take 1 tablet (1,000 mg total) by mouth daily.  90 tablet  3    No Known Allergies  Review of Systems  Review of Systems  Constitutional: Positive for malaise/fatigue. Negative for fever.  HENT: Negative for congestion.   Eyes: Negative for discharge.  Respiratory: Negative for shortness of breath.   Cardiovascular: Negative for chest pain, palpitations and leg swelling.  Gastrointestinal: Negative for nausea, abdominal pain and diarrhea.  Genitourinary: Negative for dysuria.  Musculoskeletal: Negative for falls.  Skin: Negative for rash.  Neurological: Negative for loss of consciousness and headaches.  Endo/Heme/Allergies: Negative for polydipsia.  Psychiatric/Behavioral: Positive for depression and substance abuse. Negative for suicidal ideas. The patient is nervous/anxious and has insomnia.     Objective  BP 128/85  Pulse 85  Temp(Src) 98.2 F (36.8 C) (Oral)  Ht 5\' 4"  (1.626 m)  Wt 166 lb 6.4 oz (75.479 kg)  BMI 28.56 kg/m2  SpO2 98%  Physical  Exam  Physical Exam  Constitutional: She is oriented to person, place, and time and well-developed, well-nourished, and in no distress. No distress.  HENT:  Head: Normocephalic and atraumatic.  Eyes: Conjunctivae are normal.  Neck: Neck supple. No thyromegaly present.  Cardiovascular: Normal rate, regular rhythm and normal heart sounds.   No murmur heard. Pulmonary/Chest: Effort normal and breath sounds normal. She has no wheezes.  Abdominal: She exhibits no distension and no mass.  Musculoskeletal: She exhibits no edema.  Lymphadenopathy:    She has no cervical adenopathy.  Neurological: She is alert and oriented to person, place, and time.  Skin: Skin is warm and dry. No rash noted. She is not diaphoretic.  Psychiatric: Memory, affect and judgment normal.    Lab Results  Component Value Date   TSH 1.09 06/21/2010   Lab Results  Component Value Date   WBC 14.8* 06/21/2010   HGB 13.0 06/21/2010   HCT 39.1 06/21/2010   MCV 92.9 06/21/2010   PLT 294.0 06/21/2010   Lab Results  Component Value Date   CREATININE 0.8 06/21/2010   BUN 15 06/21/2010   NA 136 06/21/2010   K 4.0 06/21/2010   CL 103 06/21/2010   CO2 24 06/21/2010   Lab Results  Component Value Date   ALT 18 06/21/2010   AST 23 06/21/2010   ALKPHOS 35* 06/21/2010   BILITOT 0.1* 06/21/2010   Lab Results  Component Value Date   CHOL 135 06/21/2010   Lab Results  Component Value Date   HDL 50.70 06/21/2010   Lab Results  Component Value Date   LDLCALC 61 06/21/2010   Lab Results  Component Value Date   TRIG 119.0 06/21/2010   Lab Results  Component Value Date   CHOLHDL 3 06/21/2010     Assessment & Plan  HTN (hypertension) Well controlled today, no changes to meds  Alcohol abuse Has cut back on consumption somewhat  Depression She is in today to request a trial prescription of Adderall. She took a dose given to her friend and she reports it helped to calm her down during the day and she rested well that  night.She reports great difficulty concentrating and it helped her to focus all day. She is allowed an rx of Adderall 10mg  po bid and we will reassess in 3 weeks. She agrees to discuss the possibility of ADd with her counselor Judithe Modest

## 2010-11-24 ENCOUNTER — Ambulatory Visit: Payer: 59 | Admitting: Family Medicine

## 2010-12-01 ENCOUNTER — Ambulatory Visit (INDEPENDENT_AMBULATORY_CARE_PROVIDER_SITE_OTHER): Payer: 59 | Admitting: Licensed Clinical Social Worker

## 2010-12-01 DIAGNOSIS — F3189 Other bipolar disorder: Secondary | ICD-10-CM

## 2010-12-09 ENCOUNTER — Ambulatory Visit (INDEPENDENT_AMBULATORY_CARE_PROVIDER_SITE_OTHER): Payer: 59 | Admitting: Family Medicine

## 2010-12-09 ENCOUNTER — Encounter: Payer: Self-pay | Admitting: Family Medicine

## 2010-12-09 VITALS — BP 155/100 | HR 93 | Temp 98.3°F | Ht 64.0 in | Wt 162.0 lb

## 2010-12-09 DIAGNOSIS — G4700 Insomnia, unspecified: Secondary | ICD-10-CM

## 2010-12-09 DIAGNOSIS — R413 Other amnesia: Secondary | ICD-10-CM

## 2010-12-09 DIAGNOSIS — I10 Essential (primary) hypertension: Secondary | ICD-10-CM

## 2010-12-09 DIAGNOSIS — R41840 Attention and concentration deficit: Secondary | ICD-10-CM

## 2010-12-09 DIAGNOSIS — F1010 Alcohol abuse, uncomplicated: Secondary | ICD-10-CM

## 2010-12-09 MED ORDER — AMPHETAMINE-DEXTROAMPHETAMINE 10 MG PO TABS: ORAL_TABLET | ORAL | Status: DC

## 2010-12-09 MED ORDER — AMPHETAMINE-DEXTROAMPHET ER 20 MG PO CP24: 20.0000 mg | ORAL_CAPSULE | ORAL | Status: DC

## 2010-12-09 NOTE — Patient Instructions (Signed)
Attention Deficit Hyperactivity Disorder Attention deficit hyperactivity disorder (ADHD) is a problem with behavior issues based on the way the brain functions (neurobehavioral disorder). It is a common reason for behavior and academic problems in school. CAUSES  The cause of ADHD is unknown in most cases. It may run in families. It sometimes can be associated with learning disabilities and other behavioral problems. SYMPTOMS  There are 3 types of ADHD. The 3 types and some of the symptoms include:  Inattentive   Gets bored or distracted easily.   Loses or forgets things. Forgets to hand in homework.   Has trouble organizing or completing tasks.   Difficulty staying on task.   An inability to organize daily tasks and school work.   Leaving projects, chores, or homework unfinished.   Trouble paying attention or responding to details. Careless mistakes.   Difficulty following directions. Often seems like is not listening.   Dislikes activities that require sustained attention (like chores or homework).   Hyperactive-impulsive   Feels like it is impossible to sit still or stay in a seat. Fidgeting with hands and feet.   Trouble waiting turn.   Talking too much or out of turn. Interruptive.   Speaks or acts impulsively.   Aggressive, disruptive behavior.   Constantly busy or on the go, noisy.   Combined   Has symptoms of both of the above.  Often children with ADHD feel discouraged about themselves and with school. They often perform well below their abilities in school. These symptoms can cause problems in home, school, and in relationships with peers. As children get older, the excess motor activities can calm down, but the problems with paying attention and staying organized persist. Most children do not outgrow ADHD but with good treatment can learn to cope with the symptoms. DIAGNOSIS  When ADHD is suspected, the diagnosis should be made by professionals trained in  ADHD.  Diagnosis will include:  Ruling out other reasons for the child's behavior.   The caregivers will check with the child's school and check their medical records.   They will talk to teachers and parents.   Behavior rating scales for the child will be filled out by those dealing with the child on a daily basis.  A diagnosis is made only after all information has been considered. TREATMENT  Treatment usually includes behavioral treatment often along with medicines. It may include stimulant medicines. The stimulant medicines decrease impulsivity and hyperactivity and increase attention. Other medicines used include antidepressants and certain blood pressure medicines. Most experts agree that treatment for ADHD should address all aspects of the child's functioning. Treatment should not be limited to the use of medicines alone. Treatment should include structured classroom management. The parents must receive education to address rewarding good behavior, discipline, and limit-setting. Tutoring or behavioral therapy or both should be available for the child. If untreated, the disorder can have long-term serious effects into adolescence and adulthood. HOME CARE INSTRUCTIONS   Often with ADHD there is a lot of frustration among the family in dealing with the illness. There is often blame and anger that is not warranted. This is a life long illness. There is no way to prevent ADHD. In many cases, because the problem affects the family as a whole, the entire family may need help. A therapist can help the family find better ways to handle the disruptive behaviors and promote change. If the child is young, most of the therapist's work is with the parents. Parents will   learn techniques for coping with and improving their child's behavior. Sometimes only the child with the ADHD needs counseling. Your caregivers can help you make these decisions.   Children with ADHD may need help in organizing. Some  helpful tips include:   Keep routines the same every day from wake-up time to bedtime. Schedule everything. This includes homework and playtime. This should include outdoor and indoor recreation. Keep the schedule on the refrigerator or a bulletin board where it is frequently seen. Mark schedule changes as far in advance as possible.   Have a place for everything and keep everything in its place. This includes clothing, backpacks, and school supplies.   Encourage writing down assignments and bringing home needed books.   Offer your child a well-balanced diet. Breakfast is especially important for school performance. Children should avoid drinks with caffeine including:   Soft drinks.   Coffee.   Tea.   However, some older children (adolescents) may find these drinks helpful in improving their attention.   Children with ADHD need consistent rules that they can understand and follow. If rules are followed, give small rewards. Children with ADHD often receive, and expect, criticism. Look for good behavior and praise it. Set realistic goals. Give clear instructions. Look for activities that can foster success and self-esteem. Make time for pleasant activities with your child. Give lots of affection.   Parents are their children's greatest advocates. Learn as much as possible about ADHD. This helps you become a stronger and better advocate for your child. It also helps you educate your child's teachers and instructors if they feel inadequate in these areas. Parent support groups are often helpful. A national group with local chapters is called CHADD (Children and Adults with Attention Deficit Hyperactivity Disorder).  PROGNOSIS  There is no cure for ADHD. Children with the disorder seldom outgrow it. Many find adaptive ways to accommodate the ADHD as they mature. SEEK MEDICAL CARE IF:  Your child has repeated muscle twitches, cough or speech outbursts.   Your child has sleep problems.   Your  child has a marked loss of appetite.   Your child develops depression.   Your child has new or worsening behavioral problems.   Your child develops dizziness.   Your child has a racing heart.   Your child has stomach pains.   Your child develops headaches.  Document Released: 01/07/2002 Document Revised: 09/29/2010 Document Reviewed: 08/20/2007 [employer] Patient Information 2012 [employer]. 

## 2010-12-10 ENCOUNTER — Other Ambulatory Visit: Payer: Self-pay

## 2010-12-10 NOTE — Progress Notes (Signed)
Prior authorization is done

## 2010-12-15 ENCOUNTER — Ambulatory Visit: Payer: 59 | Admitting: Licensed Clinical Social Worker

## 2010-12-19 ENCOUNTER — Encounter: Payer: Self-pay | Admitting: Family Medicine

## 2010-12-19 DIAGNOSIS — R41840 Attention and concentration deficit: Secondary | ICD-10-CM | POA: Insufficient documentation

## 2010-12-19 DIAGNOSIS — N898 Other specified noninflammatory disorders of vagina: Secondary | ICD-10-CM | POA: Insufficient documentation

## 2010-12-19 NOTE — Assessment & Plan Note (Signed)
Improved at the present time, no change in meds

## 2010-12-19 NOTE — Progress Notes (Signed)
Cheryl Taylor [id number] [birth year] 12/19/2010      Progress Note-Follow Up  Subjective  Chief Complaint  Chief Complaint  Patient presents with  . Follow-up    2 month follow up    HPI  Patient is a 45 caucasian feamle in today for follow up on Adderrall. She is feeling well without side effects. No HA/CP/palp/SOB/anxiety or irritability. She is doing better at work and at home. Concentrating better, less irritable, and less drinking. No acute illness, fever or congestion. No increased insomnia  Past Medical History  Diagnosis Date  . Depression   . Anxiety   . Tachycardia 04/20/2010  . Alcohol abuse 04/20/2010  . Vitamin D deficiency 04/20/2010  . HTN (hypertension) 06/07/2010  . Chest pain, atypical 06/07/2010  . Diarrhea 06/21/2010  . High risk sexual behavior 06/21/2010  . Poor concentration 12/19/2010    Past Surgical History  Procedure Date  . Cesarean section 2003  . Tonsillectomy 1980    Family History  Problem Relation Age of Onset  . Kidney failure Father 52    deceased  . Kidney disease Father   . Obesity Father   . Seizures Father   . Heart disease Father 56  . Schizophrenia Brother     52yo  . Alcohol abuse Brother   . Other Brother     chronic pain, 50 yo  . Alcohol abuse Sister     61yo  . Obesity Paternal Grandfather     History   Social History  . Marital Status: Married    Spouse Name: N/A    Number of Children: N/A  . Years of Education: N/A   Occupational History  . Not on file.   Social History Main Topics  . Smoking status: Never Smoker   . Smokeless tobacco: Never Used  . Alcohol Use: 30.0 oz/week    50 Glasses of wine per week     2 bottle a night X 7 days/ pt quit on 04/19/10- pt is still drinking-  1 bottle at night for about 2 weeks  . Drug Use: No  . Sexually Active: Yes -- Female partner(s)    Birth Control/ Protection: Pill   Other Topics Concern  . Not on file   Social History Narrative  . No narrative on file     Current Outpatient Prescriptions on File Prior to Visit  Medication Sig Dispense Refill  . buPROPion (WELLBUTRIN XL) 150 MG 24 hr tablet Take 150 mg by mouth daily.        . chlorthalidone (HYGROTON) 25 MG tablet Take 1 tablet (25 mg total) by mouth daily.  90 tablet  1  . LORazepam (ATIVAN) 1 MG tablet 1-2 tabs po bid prn anxiety/alcohol withdrawal  90 tablet  1  . metoprolol (TOPROL-XL) 50 MG 24 hr tablet Take 1 tablet (50 mg total) by mouth daily.  90 tablet  2  . norethindrone (MICRONOR,CAMILA,ERRIN) 0.35 MG tablet Take 1 tablet by mouth daily.        . ranitidine (ZANTAC) 150 MG tablet Take 1 tablet (150 mg total) by mouth 2 (two) times daily as needed for heartburn.  60 tablet  1  . valACYclovir (VALTREX) 1000 MG tablet Take 1 tablet (1,000 mg total) by mouth daily.  90 tablet  3    No Known Allergies  Review of Systems  Review of Systems  Constitutional: Negative for fever and malaise/fatigue.  HENT: Negative for congestion.   Eyes: Negative for discharge.  Respiratory: Negative for  shortness of breath.   Cardiovascular: Negative for chest pain, palpitations and leg swelling.  Gastrointestinal: Negative for nausea, abdominal pain and diarrhea.  Genitourinary: Negative for dysuria.  Musculoskeletal: Negative for falls.  Skin: Negative for rash.  Neurological: Negative for loss of consciousness and headaches.  Endo/Heme/Allergies: Negative for polydipsia.  Psychiatric/Behavioral: Negative for depression and suicidal ideas. The patient is not nervous/anxious and does not have insomnia.     Objective  BP 155/100  Pulse 93  Temp(Src) 98.3 F (36.8 C) (Oral)  Ht 5\' 4"  (1.626 m)  Wt 162 lb (73.483 kg)  BMI 27.81 kg/m2  SpO2 100%  Physical Exam  Physical Exam  Constitutional: She is oriented to person, place, and time and well-developed, well-nourished, and in no distress. No distress.  HENT:  Head: Normocephalic and atraumatic.  Eyes: Conjunctivae are normal.   Neck: Neck supple. No thyromegaly present.  Cardiovascular: Normal rate, regular rhythm and normal heart sounds.   No murmur heard. Pulmonary/Chest: Effort normal and breath sounds normal. She has no wheezes.  Abdominal: She exhibits no distension and no mass.  Musculoskeletal: She exhibits no edema.  Lymphadenopathy:    She has no cervical adenopathy.  Neurological: She is alert and oriented to person, place, and time.  Skin: Skin is warm and dry. No rash noted. She is not diaphoretic.  Psychiatric: Memory, affect and judgment normal.    Lab Results  Component Value Date   TSH 1.09 06/21/2010   Lab Results  Component Value Date   WBC 14.8* 06/21/2010   HGB 13.0 06/21/2010   HCT 39.1 06/21/2010   MCV 92.9 06/21/2010   PLT 294.0 06/21/2010   Lab Results  Component Value Date   CREATININE 0.8 06/21/2010   BUN 15 06/21/2010   NA 136 06/21/2010   K 4.0 06/21/2010   CL 103 06/21/2010   CO2 24 06/21/2010   Lab Results  Component Value Date   ALT 18 06/21/2010   AST 23 06/21/2010   ALKPHOS 35* 06/21/2010   BILITOT 0.1* 06/21/2010   Lab Results  Component Value Date   CHOL 135 06/21/2010   Lab Results  Component Value Date   HDL 50.70 06/21/2010   Lab Results  Component Value Date   LDLCALC 61 06/21/2010   Lab Results  Component Value Date   TRIG 119.0 06/21/2010   Lab Results  Component Value Date   CHOLHDL 3 06/21/2010     Assessment & Plan  Poor concentration Patient started on a trial of Adderral at her last visit due to long history of poor concentration and hi anxiety as a result. She is very happy with the result she feels much calmer and is doing well at home and at work. We will try an extended release version to see if she can better results for more of the day  HTN (hypertension) Adequately controlled at the current time  Insomnia Improved at the present time, no change in meds  Alcohol abuse Significantly decreased intake

## 2010-12-19 NOTE — Assessment & Plan Note (Signed)
Adequately controlled at the current time

## 2010-12-19 NOTE — Assessment & Plan Note (Addendum)
Patient started on a trial of Adderral at her last visit due to long history of poor concentration and hi anxiety as a result. She is very happy with the result she feels much calmer and is doing well at home and at work. We will try an extended release version to see if she can better results for more of the day

## 2010-12-19 NOTE — Assessment & Plan Note (Signed)
Significantly decreased intake

## 2010-12-29 ENCOUNTER — Other Ambulatory Visit: Payer: Self-pay

## 2010-12-29 MED ORDER — AMPHETAMINE-DEXTROAMPHETAMINE 10 MG PO TABS: 10.0000 mg | ORAL_TABLET | Freq: Three times a day (TID) | ORAL | Status: DC

## 2010-12-29 NOTE — Telephone Encounter (Signed)
OK to switch back to Adderall 10 mg 1 tab po tid, # 90 and she needs an appt next month to reevaluate

## 2010-12-29 NOTE — Telephone Encounter (Signed)
Pt left a message stating that she doesn't like the XR of the Adderall patient states it seems to strong? Pt would like to know if she can have an RX for the regular Adderall? Pt states she takes 1 in the morning and 1 1/2 in the afternoon. Please advise? Patient callback number [phone]

## 2010-12-29 NOTE — Telephone Encounter (Signed)
Left a detailed message stating that RX is ready to be picked up, bring ID to sign for RX, and to schedule appt for next month to reevaluate

## 2011-01-05 ENCOUNTER — Ambulatory Visit: Payer: 59 | Admitting: Family Medicine

## 2011-01-27 ENCOUNTER — Ambulatory Visit: Payer: Self-pay | Admitting: Family Medicine

## 2011-02-03 ENCOUNTER — Ambulatory Visit: Payer: Self-pay | Admitting: Family Medicine

## 2011-02-07 ENCOUNTER — Other Ambulatory Visit: Payer: Self-pay

## 2011-02-07 DIAGNOSIS — F10239 Alcohol dependence with withdrawal, unspecified: Secondary | ICD-10-CM

## 2011-02-07 MED ORDER — LORAZEPAM 1 MG PO TABS: ORAL_TABLET | ORAL | Status: DC

## 2011-02-07 MED ORDER — AMPHETAMINE-DEXTROAMPHETAMINE 10 MG PO TABS: 10.0000 mg | ORAL_TABLET | Freq: Three times a day (TID) | ORAL | Status: DC

## 2011-02-07 NOTE — Telephone Encounter (Signed)
Patient informed RX's are ready

## 2011-02-07 NOTE — Telephone Encounter (Signed)
Please advise refill? Pt will come by to pick up today

## 2011-02-14 ENCOUNTER — Encounter: Payer: Self-pay | Admitting: Family Medicine

## 2011-02-14 ENCOUNTER — Ambulatory Visit (INDEPENDENT_AMBULATORY_CARE_PROVIDER_SITE_OTHER): Payer: 59 | Admitting: Family Medicine

## 2011-02-14 DIAGNOSIS — Z7251 High risk heterosexual behavior: Secondary | ICD-10-CM

## 2011-02-14 DIAGNOSIS — R413 Other amnesia: Secondary | ICD-10-CM

## 2011-02-14 DIAGNOSIS — R41840 Attention and concentration deficit: Secondary | ICD-10-CM

## 2011-02-14 DIAGNOSIS — F32A Depression, unspecified: Secondary | ICD-10-CM

## 2011-02-14 DIAGNOSIS — F329 Major depressive disorder, single episode, unspecified: Secondary | ICD-10-CM

## 2011-02-14 DIAGNOSIS — I10 Essential (primary) hypertension: Secondary | ICD-10-CM

## 2011-02-14 DIAGNOSIS — F1010 Alcohol abuse, uncomplicated: Secondary | ICD-10-CM

## 2011-02-14 DIAGNOSIS — F3289 Other specified depressive episodes: Secondary | ICD-10-CM

## 2011-02-14 MED ORDER — AMPHETAMINE-DEXTROAMPHETAMINE 10 MG PO TABS: 10.0000 mg | ORAL_TABLET | Freq: Three times a day (TID) | ORAL | Status: DC

## 2011-02-14 NOTE — Assessment & Plan Note (Signed)
Stable on Wellbutrin and Concerta

## 2011-02-14 NOTE — Assessment & Plan Note (Signed)
Patient tolerating current dose Adderall. No changes

## 2011-02-14 NOTE — Progress Notes (Signed)
Patient ID: Cheryl Taylor, female   DOB: 03-25-1965, 46 y.o.   MRN: [id number] Versia Mignogna [id number] 22-Sep-1965 02/14/2011      Progress Note-Follow Up  Subjective  Chief Complaint  Chief Complaint  Patient presents with  . Follow-up    1 month follow up    HPI  Patient is a 46 year old Caucasian female in today for followup. Doing much better. She reports checking her blood pressure at home and noting numbers in the low 130s over 80s. She denies headache, chest pain, palpitations, shortness of breath, GI or GU complaints. She did have a URI a couple weeks ago but her symptoms have resolved. She congestion, cough and malaise. No fevers. She continues to do well with her alcohol use and is able now to just drinks very small amounts on special occasions. She believes her compulsive behavior with her taking her high risk sexual behavior is much better since starting Concerta. She feels much less anxious and is able to concentrate. She's had no headaches or concerning secondary symptoms from the Concerta and is very pleased with her response.  Past Medical History  Diagnosis Date  . Depression   . Anxiety   . Tachycardia 04/20/2010  . Alcohol abuse 04/20/2010  . Vitamin D deficiency 04/20/2010  . HTN (hypertension) 06/07/2010  . Chest pain, atypical 06/07/2010  . Diarrhea 06/21/2010  . High risk sexual behavior 06/21/2010  . Poor concentration 12/19/2010    Past Surgical History  Procedure Date  . Cesarean section 2003  . Tonsillectomy 1980    Family History  Problem Relation Age of Onset  . Kidney failure Father 44    deceased  . Kidney disease Father   . Obesity Father   . Seizures Father   . Heart disease Father 56  . Schizophrenia Brother     52yo  . Alcohol abuse Brother   . Other Brother     chronic pain, 49 yo  . Alcohol abuse Sister     38yo  . Obesity Paternal Grandfather     History   Social History  . Marital Status: Married    Spouse Name: N/A    Number  of Children: N/A  . Years of Education: N/A   Occupational History  . Not on file.   Social History Main Topics  . Smoking status: Never Smoker   . Smokeless tobacco: Never Used  . Alcohol Use: 30.0 oz/week    50 Glasses of wine per week     2 bottle a night X 7 days/ pt quit on 04/19/10- pt is still drinking-  1 bottle at night for about 2 weeks  . Drug Use: No  . Sexually Active: Yes -- Female partner(s)    Birth Control/ Protection: Pill   Other Topics Concern  . Not on file   Social History Narrative  . No narrative on file    Current Outpatient Prescriptions on File Prior to Visit  Medication Sig Dispense Refill  . amphetamine-dextroamphetamine (ADDERALL) 10 MG tablet Take 1 tablet (10 mg total) by mouth 3 (three) times daily.  90 tablet  0  . buPROPion (WELLBUTRIN XL) 150 MG 24 hr tablet Take 150 mg by mouth daily.        . chlorthalidone (HYGROTON) 25 MG tablet Take 1 tablet (25 mg total) by mouth daily.  90 tablet  1  . LORazepam (ATIVAN) 1 MG tablet 1-2 tabs po bid prn anxiety/alcohol withdrawal  90 tablet  1  . metoprolol (TOPROL-XL)  50 MG 24 hr tablet Take 1 tablet (50 mg total) by mouth daily.  90 tablet  2  . norethindrone (MICRONOR,CAMILA,ERRIN) 0.35 MG tablet Take 1 tablet by mouth daily.        . ranitidine (ZANTAC) 150 MG tablet Take 1 tablet (150 mg total) by mouth 2 (two) times daily as needed for heartburn.  60 tablet  1  . temazepam (RESTORIL) 22.5 MG capsule       . valACYclovir (VALTREX) 1000 MG tablet Take 1 tablet (1,000 mg total) by mouth daily.  90 tablet  3    No Known Allergies  Review of Systems  Review of Systems  Constitutional: Negative for fever and malaise/fatigue.  HENT: Negative for congestion.   Eyes: Negative for discharge.  Respiratory: Negative for shortness of breath.   Cardiovascular: Negative for chest pain, palpitations and leg swelling.  Gastrointestinal: Negative for nausea, abdominal pain and diarrhea.  Genitourinary:  Negative for dysuria.  Musculoskeletal: Negative for falls.  Skin: Negative for rash.  Neurological: Negative for loss of consciousness and headaches.  Endo/Heme/Allergies: Negative for polydipsia.  Psychiatric/Behavioral: Negative for depression and suicidal ideas. The patient is not nervous/anxious and does not have insomnia.     Objective  BP 149/86  Pulse 87  Temp(Src) 97.7 F (36.5 C) (Temporal)  Ht 5\' 4"  (1.626 m)  Wt 162 lb 12.8 oz (73.846 kg)  BMI 27.94 kg/m2  SpO2 97%  Physical Exam  Physical Exam  Constitutional: She is oriented to person, place, and time and well-developed, well-nourished, and in no distress. No distress.  HENT:  Head: Normocephalic and atraumatic.  Eyes: Conjunctivae are normal.  Neck: Neck supple. No thyromegaly present.  Cardiovascular: Normal rate, regular rhythm and normal heart sounds.   No murmur heard. Pulmonary/Chest: Effort normal and breath sounds normal. She has no wheezes.  Abdominal: She exhibits no distension and no mass.  Musculoskeletal: She exhibits no edema.  Lymphadenopathy:    She has no cervical adenopathy.  Neurological: She is alert and oriented to person, place, and time.  Skin: Skin is warm and dry. No rash noted. She is not diaphoretic.  Psychiatric: Memory, affect and judgment normal.    Lab Results  Component Value Date   TSH 1.09 06/21/2010   Lab Results  Component Value Date   WBC 14.8* 06/21/2010   HGB 13.0 06/21/2010   HCT 39.1 06/21/2010   MCV 92.9 06/21/2010   PLT 294.0 06/21/2010   Lab Results  Component Value Date   CREATININE 0.8 06/21/2010   BUN 15 06/21/2010   NA 136 06/21/2010   K 4.0 06/21/2010   CL 103 06/21/2010   CO2 24 06/21/2010   Lab Results  Component Value Date   ALT 18 06/21/2010   AST 23 06/21/2010   ALKPHOS 35* 06/21/2010   BILITOT 0.1* 06/21/2010   Lab Results  Component Value Date   CHOL 135 06/21/2010   Lab Results  Component Value Date   HDL 50.70 06/21/2010   Lab Results    Component Value Date   LDLCALC 61 06/21/2010   Lab Results  Component Value Date   TRIG 119.0 06/21/2010   Lab Results  Component Value Date   CHOLHDL 3 06/21/2010     Assessment & Plan   Poor concentration Patient tolerating current dose Adderall. No changes  HTN (hypertension) patient reports bp numbers at home are usually low 130s over 80s. No changes today  Alcohol abuse Doing much better able to drink only small  amounts at special occasions, better since starting concerta  Depression Stable on Wellbutrin and Concerta  High risk sexual behavior Doing much better since starting Concerta, will check all STDs with annual pap at next visit or sooner if patient changes behavior or becomes symptomatic

## 2011-02-14 NOTE — Assessment & Plan Note (Signed)
Doing much better since starting Concerta, will check all STDs with annual pap at next visit or sooner if patient changes behavior or becomes symptomatic

## 2011-02-14 NOTE — Assessment & Plan Note (Addendum)
Doing much better able to drink only small amounts at special occasions, better since starting concerta

## 2011-02-14 NOTE — Assessment & Plan Note (Signed)
patient reports bp numbers at home are usually low 130s over 80s. No changes today

## 2011-02-14 NOTE — Patient Instructions (Signed)

## 2011-03-16 ENCOUNTER — Other Ambulatory Visit: Payer: Self-pay

## 2011-03-16 MED ORDER — BUPROPION HCL ER (XL) 150 MG PO TB24: 150.0000 mg | ORAL_TABLET | Freq: Every day | ORAL | Status: DC

## 2011-04-04 ENCOUNTER — Other Ambulatory Visit: Payer: Self-pay

## 2011-04-04 DIAGNOSIS — F10239 Alcohol dependence with withdrawal, unspecified: Secondary | ICD-10-CM

## 2011-04-04 MED ORDER — LORAZEPAM 1 MG PO TABS: ORAL_TABLET | ORAL | Status: DC

## 2011-04-04 NOTE — Telephone Encounter (Signed)
OK to refill with same sig and same number, with 2 rf

## 2011-04-04 NOTE — Telephone Encounter (Signed)
RX faxed to pharmacy.

## 2011-04-04 NOTE — Telephone Encounter (Signed)
Pt called asking for a refill on Lorazepam? Please advise? Last refill on 02-07-11. Send to [facility]

## 2011-05-16 ENCOUNTER — Other Ambulatory Visit: Payer: Self-pay | Admitting: Family Medicine

## 2011-06-28 ENCOUNTER — Other Ambulatory Visit: Payer: Self-pay

## 2011-06-28 DIAGNOSIS — F10239 Alcohol dependence with withdrawal, unspecified: Secondary | ICD-10-CM

## 2011-06-28 MED ORDER — LORAZEPAM 1 MG PO TABS: ORAL_TABLET | ORAL | Status: DC

## 2011-06-28 NOTE — Telephone Encounter (Signed)
Please advise refill? Last RX wrote on 04-04-11 quantity 90 with 2 refills. Please fax to [phone]

## 2011-07-12 ENCOUNTER — Ambulatory Visit (INDEPENDENT_AMBULATORY_CARE_PROVIDER_SITE_OTHER): Payer: 59 | Admitting: Licensed Clinical Social Worker

## 2011-07-12 DIAGNOSIS — F3189 Other bipolar disorder: Secondary | ICD-10-CM

## 2011-07-13 ENCOUNTER — Emergency Department (HOSPITAL_COMMUNITY)
Admission: EM | Admit: 2011-07-13 | Discharge: 2011-07-14 | Disposition: A | Discharge status: Other Acute Inpatient Hospital | Payer: 59 | Attending: Emergency Medicine | Admitting: Emergency Medicine

## 2011-07-13 ENCOUNTER — Encounter (HOSPITAL_COMMUNITY): Payer: Self-pay | Admitting: *Deleted

## 2011-07-13 DIAGNOSIS — Z79899 Other long term (current) drug therapy: Secondary | ICD-10-CM | POA: Insufficient documentation

## 2011-07-13 DIAGNOSIS — F1320 Sedative, hypnotic or anxiolytic dependence, uncomplicated: Secondary | ICD-10-CM | POA: Insufficient documentation

## 2011-07-13 DIAGNOSIS — F3289 Other specified depressive episodes: Secondary | ICD-10-CM | POA: Insufficient documentation

## 2011-07-13 DIAGNOSIS — F329 Major depressive disorder, single episode, unspecified: Secondary | ICD-10-CM | POA: Insufficient documentation

## 2011-07-13 DIAGNOSIS — I10 Essential (primary) hypertension: Secondary | ICD-10-CM | POA: Insufficient documentation

## 2011-07-13 DIAGNOSIS — F1010 Alcohol abuse, uncomplicated: Secondary | ICD-10-CM

## 2011-07-13 DIAGNOSIS — F411 Generalized anxiety disorder: Secondary | ICD-10-CM | POA: Insufficient documentation

## 2011-07-13 DIAGNOSIS — F1020 Alcohol dependence, uncomplicated: Secondary | ICD-10-CM

## 2011-07-13 LAB — COMPREHENSIVE METABOLIC PANEL
ALT: 22 U/L (ref 0–35)
AST: 26 U/L (ref 0–37)
Albumin: 4.1 g/dL (ref 3.5–5.2)
Alkaline Phosphatase: 39 U/L (ref 39–117)
BUN: 9 mg/dL (ref 6–23)
CO2: 25 mEq/L (ref 19–32)
Calcium: 9.1 mg/dL (ref 8.4–10.5)
Chloride: 105 mEq/L (ref 96–112)
Creatinine, Ser: 0.57 mg/dL (ref 0.50–1.10)
GFR calc Af Amer: >90 mL/min (ref >90)
GFR calc non Af Amer: >90 mL/min (ref >90)
Glucose, Bld: 87 mg/dL (ref 70–99)
Potassium: 3.2 mEq/L — ABNORMAL LOW (ref 3.5–5.1)
Sodium: 142 mEq/L (ref 135–145)
Total Bilirubin: 0.1 mg/dL — ABNORMAL LOW (ref 0.3–1.2)
Total Protein: 7.5 g/dL (ref 6.0–8.3)

## 2011-07-13 LAB — CBC
HCT: 38.2 % (ref 36.0–46.0)
Hemoglobin: 13.1 g/dL (ref 12.0–15.0)
MCH: 30.1 pg (ref 26.0–34.0)
MCHC: 34.3 g/dL (ref 30.0–36.0)
MCV: 87.8 fL (ref 78.0–100.0)
Platelets: 299 10*3/uL (ref 150–400)
RBC: 4.35 MIL/uL (ref 3.87–5.11)
RDW: 12.8 % (ref 11.5–15.5)
WBC: 7.5 10*3/uL (ref 4.0–10.5)

## 2011-07-13 LAB — RAPID URINE DRUG SCREEN, HOSP PERFORMED
Amphetamines: NOT DETECTED
Barbiturates: NOT DETECTED
Benzodiazepines: NOT DETECTED
Cocaine: NOT DETECTED
Opiates: NOT DETECTED
Tetrahydrocannabinol: NOT DETECTED

## 2011-07-13 LAB — ACETAMINOPHEN LEVEL: Acetaminophen (Tylenol), Serum: <15 ug/mL (ref 10–30)

## 2011-07-13 LAB — POCT PREGNANCY, URINE: Preg Test, Ur: NEGATIVE

## 2011-07-13 LAB — ETHANOL: Alcohol, Ethyl (B): 118 mg/dL — ABNORMAL HIGH (ref 0–11)

## 2011-07-13 MED ORDER — ZOLPIDEM TARTRATE 5 MG PO TABS
5.0000 mg | ORAL_TABLET | Freq: Every evening | ORAL | Status: DC | PRN
  Administered 2011-07-13: 5 mg via ORAL
  Filled 2011-07-13: qty 1

## 2011-07-13 MED ORDER — IBUPROFEN 600 MG PO TABS
600.0000 mg | ORAL_TABLET | Freq: Three times a day (TID) | ORAL | Status: DC | PRN
  Administered 2011-07-13: 600 mg via ORAL
  Filled 2011-07-13: qty 3

## 2011-07-13 MED ORDER — ONDANSETRON HCL 4 MG PO TABS: 4.0000 mg | ORAL_TABLET | Freq: Three times a day (TID) | ORAL | Status: DC | PRN

## 2011-07-13 NOTE — ED Notes (Signed)
Pt sent from MD's office for inpatient detox ETOH and Ativan,Last Ativan 2mg  this am, ETOH this am ice tea glass of Rum

## 2011-07-13 NOTE — ED Notes (Signed)
Pt changed into scrubs and wanded, 1 piece of luggage and purse with pt

## 2011-07-13 NOTE — BH Assessment (Signed)
Assessment Note   Cheryl Taylor is a 46 y.o. female who presents to [facility] voluntarily requesting detox from alcohol and Ativan. She reports she drinks 2 bottles of wine daily for the past several months. She states she has been drinking heavily on and off for the past 20 years, but has increased her use since getting a promotion at work. She states she has been under a lot of pressure and feels like she does not have the resources to her job well. She states she decided to seek treatment today because she feel like alcohol has consumed her life. She states she use to come home from work and play with her daughter, but now she comes home and drinks until "I pass out." She states that she attempted to detox on her own last year and was given a prescription of Ativan to help with symptoms by her primary care provider. She states she then began abusing the Ativan, taking 5, 1mg  tablets daily. Pt denies any other SA. Pt states drinking is also interfering with her work life, stating she has started calling out sick frequently and leaving early. She further states she feels like her life is on the verge of falling apart and she wants treatment before it falls of the edge.   She denies SI, HI, and AVH. She reports a family history of depression and anxiety. She states she has a history of anxiety. She currently lives with her husband and daughter. She reports her husband is very supportive. Pt appears highly motivated for change.     Axis I: Alcohol Dependence Axis II: Deferred Axis III:  Past Medical History  Diagnosis Date  . Depression   . Anxiety   . Tachycardia 04/20/2010  . Alcohol abuse 04/20/2010  . Vitamin d deficiency 04/20/2010  . HTN (hypertension) 06/07/2010  . Chest pain, atypical 06/07/2010  . Diarrhea 06/21/2010  . High risk sexual behavior 06/21/2010  . Poor concentration 12/19/2010   Axis IV: occupational problems and other psychosocial or environmental problems Axis V: 41-50 serious  symptoms  Past Medical History:  Past Medical History  Diagnosis Date  . Depression   . Anxiety   . Tachycardia 04/20/2010  . Alcohol abuse 04/20/2010  . Vitamin d deficiency 04/20/2010  . HTN (hypertension) 06/07/2010  . Chest pain, atypical 06/07/2010  . Diarrhea 06/21/2010  . High risk sexual behavior 06/21/2010  . Poor concentration 12/19/2010    Past Surgical History  Procedure Date  . Cesarean section 2003  . Tonsillectomy 1980    Family History:  Family History  Problem Relation Age of Onset  . Kidney failure Father 48    deceased  . Kidney disease Father   . Obesity Father   . Seizures Father   . Heart disease Father 29  . Schizophrenia Brother     52yo  . Alcohol abuse Brother   . Other Brother     chronic pain, 55 yo  . Alcohol abuse Sister     34yo  . Obesity Paternal Grandfather     Social History:  reports that she has never smoked. She has never used smokeless tobacco. She reports that she drinks about 30 ounces of alcohol per week. She reports that she does not use illicit drugs.  Additional Social History:  Alcohol / Drug Use History of alcohol / drug use?: Yes Substance #1 Name of Substance 1: alcohol 1 - Age of First Use: 26 1 - Amount (size/oz): 2 bottles of wine 1 -  Frequency: daily 1 - Duration: off and on for 20 years, most recently "a few years" 1 - Last Use / Amount: 07/13/11  CIWA: CIWA-Ar BP: 154/95 mmHg Pulse Rate: 93  Nausea and Vomiting: no nausea and no vomiting Tactile Disturbances: none Tremor: no tremor Auditory Disturbances: not present Paroxysmal Sweats: no sweat visible Visual Disturbances: not present Anxiety: no anxiety, at ease Headache, Fullness in Head: none present Agitation: normal activity Orientation and Clouding of Sensorium: oriented and can do serial additions CIWA-Ar Total: 0  COWS:    Allergies: No Known Allergies  Home Medications:  (Not in a hospital admission)  OB/GYN Status:  No LMP recorded.  Patient is not currently having periods (Reason: Oral contraceptives).  General Assessment Data Location of Assessment: [facility] ED Living Arrangements: Spouse/significant other;Children Can pt return to current living arrangement?: Yes Admission Status: Voluntary Is patient capable of signing voluntary admission?: Yes Transfer from: Acute Hospital Referral Source: MD  Education Status Is patient currently in school?: No  Risk to self Suicidal Ideation: No Suicidal Intent: No Is patient at risk for suicide?: No Suicidal Plan?: No Access to Means: No What has been your use of drugs/alcohol within the last 12 months?: alcohol Previous Attempts/Gestures: No How many times?: 0  Other Self Harm Risks: none Triggers for Past Attempts: None known Intentional Self Injurious Behavior: None Family Suicide History: No Recent stressful life event(s):  (increased anxiety over work) Persecutory voices/beliefs?: No Depression: Yes Depression Symptoms: Loss of interest in usual pleasures;Insomnia Substance abuse history and/or treatment for substance abuse?: Yes Suicide prevention information given to non-admitted patients: Not applicable  Risk to Others Homicidal Ideation: No Thoughts of Harm to Others: No Current Homicidal Intent: No Current Homicidal Plan: No Access to Homicidal Means: No Identified Victim: none History of harm to others?: No Assessment of Violence: None Noted Violent Behavior Description: cooperative Does patient have access to weapons?: No Criminal Charges Pending?: No Does patient have a court date: No  Psychosis Hallucinations: None noted Delusions: None noted  Mental Status Report Appear/Hygiene: Disheveled Eye Contact: Good Motor Activity: Unremarkable Speech: Logical/coherent Level of Consciousness: Alert Mood: Ashamed/humiliated Affect: Appropriate to circumstance Anxiety Level: None Thought Processes: Coherent;Relevant Judgement:  Impaired Orientation: Person;Place;Time;Situation Obsessive Compulsive Thoughts/Behaviors: None  Cognitive Functioning Concentration: Normal Memory: Recent Intact;Remote Intact IQ: Average Insight: Fair Impulse Control: Poor Appetite: Fair Weight Loss: 0  Weight Gain: 12  (in 6 weeks) Sleep: Decreased Total Hours of Sleep:  (disrupted sleep, states she wakes 4 tims a night) Vegetative Symptoms: None  ADLScreening [facility]) Patient's cognitive ability adequate to safely complete daily activities?: Yes Patient able to express need for assistance with ADLs?: Yes Independently performs ADLs?: Yes  Abuse/Neglect [facility]) Physical Abuse: Denies Verbal Abuse: Denies Sexual Abuse: Denies  Prior Inpatient Therapy Prior Inpatient Therapy: Yes Prior Therapy Dates: unknown (states a few years ago) Prior Therapy Facilty/Provider(s): unknown Reason for Treatment: detox  Prior Outpatient Therapy Prior Outpatient Therapy: Yes Prior Therapy Dates: when in her 30s Reason for Treatment: anxiety  ADL Screening (condition at time of admission) Patient's cognitive ability adequate to safely complete daily activities?: Yes Patient able to express need for assistance with ADLs?: Yes Independently performs ADLs?: Yes Weakness of Legs: None Weakness of Arms/Hands: None  Home Assistive Devices/Equipment Home Assistive Devices/Equipment: None    Abuse/Neglect Assessment (Assessment to be complete while patient is alone) Physical Abuse: Denies Verbal Abuse: Denies Sexual Abuse: Denies Exploitation of patient/patient's resources: Denies Self-Neglect: Denies Values / Beliefs Cultural Requests  During Hospitalization: None Spiritual Requests During Hospitalization: None   Advance Directives (For Healthcare) Advance Directive: Patient does not have advance directive;Patient would not like information Pre-existing out of facility DNR order (yellow form or pink MOST form):  No Nutrition Screen Diet: Regular Unintentional weight loss greater than 10lbs within the last month: No Problems chewing or swallowing foods and/or liquids: No Home Tube Feeding or Total Parenteral Nutrition (TPN): No Patient appears severely malnourished: No Pregnant or Lactating: No  Additional Information 1:1 In Past 12 Months?: No CIRT Risk: No Elopement Risk: No Does patient have medical clearance?: Yes     Disposition:  Disposition Disposition of Patient: Referred to;Inpatient treatment program Type of inpatient treatment program: Adult Patient referred to:  [facility])  On Site Evaluation by:   Reviewed with Physician:     Georgina Quint A 07/13/2011 11:47 PM

## 2011-07-13 NOTE — ED Provider Notes (Signed)
History     CSN: [id number]  Arrival date & time 07/13/11  1449   First MD Initiated Contact with Patient 07/13/11 1619      Chief Complaint  Patient presents with  . Medical Clearance    (Consider location/radiation/quality/duration/timing/severity/associated sxs/prior treatment) Patient is a 46 y.o. female presenting with drug/alcohol assessment. The history is provided by the patient.  Drug / Alcohol Assessment Primary symptoms include no weakness. Pertinent negatives include no fever.  pt states she is here for alcohol and ativan detox. Pt states has been drinking for 20 years, states drinks every day, about 2 bottles of wine a night. States tried to detox herself, but unable. States her doctor gave her ativan for detox at home, but now she takes more ativan then supposed to and cannot get off ativan as well. Pt has never been for inpatient detox. Pt denies prior DTs or seizures. No other complaints. Denies SI or HI.  Past Medical History  Diagnosis Date  . Depression   . Anxiety   . Tachycardia 04/20/2010  . Alcohol abuse 04/20/2010  . Vitamin d deficiency 04/20/2010  . HTN (hypertension) 06/07/2010  . Chest pain, atypical 06/07/2010  . Diarrhea 06/21/2010  . High risk sexual behavior 06/21/2010  . Poor concentration 12/19/2010    Past Surgical History  Procedure Date  . Cesarean section 2003  . Tonsillectomy 1980    Family History  Problem Relation Age of Onset  . Kidney failure Father 78    deceased  . Kidney disease Father   . Obesity Father   . Seizures Father   . Heart disease Father 29  . Schizophrenia Brother     48yo  . Alcohol abuse Brother   . Other Brother     chronic pain, 32 yo  . Alcohol abuse Sister     51yo  . Obesity Paternal Grandfather     History  Substance Use Topics  . Smoking status: Never Smoker   . Smokeless tobacco: Never Used  . Alcohol Use: 30.0 oz/week    50 Glasses of wine per week     2-3 bottles of wine a night    OB  History    Grav Para Term Preterm Abortions TAB SAB Ect Mult Living   1 1             Obstetric Comments   Caesarean section      Review of Systems  Constitutional: Negative for fever and chills.  Respiratory: Negative.   Cardiovascular: Negative.   Gastrointestinal: Negative.   Genitourinary: Negative.   Musculoskeletal: Negative.   Skin: Negative.   Neurological: Negative for dizziness and weakness.  Psychiatric/Behavioral: Negative for suicidal ideas. The patient is nervous/anxious.     Allergies  Review of patient's allergies indicates no known allergies.  Home Medications   Current Outpatient Rx  Name Route Sig Dispense Refill  . CHLORTHALIDONE 25 MG PO TABS  TAKE 1 TABLET DAILY 90 tablet 0  . LORAZEPAM 1 MG PO TABS  1-2 tabs po bid prn anxiety/alcohol withdrawal 90 tablet 2  . METOPROLOL SUCCINATE ER 50 MG PO TB24 Oral Take 1 tablet (50 mg total) by mouth daily. 90 tablet 2  . NORETHINDRONE 0.35 MG PO TABS Oral Take 1 tablet by mouth daily.      Marland Kitchen RANITIDINE HCL 150 MG PO TABS Oral Take 1 tablet (150 mg total) by mouth 2 (two) times daily as needed for heartburn. 60 tablet 1  . VALACYCLOVIR HCL 1  G PO TABS Oral Take 1 tablet (1,000 mg total) by mouth daily. 90 tablet 3    BP 127/92  Pulse 94  Temp 98.5 F (36.9 C) (Oral)  Resp 18  SpO2 99%  Physical Exam  Nursing note and vitals reviewed. Constitutional: She is oriented to person, place, and time. She appears well-developed and well-nourished. No distress.  HENT:  Head: Normocephalic.  Eyes: Conjunctivae are normal. Pupils are equal, round, and reactive to light. No scleral icterus.  Neck: Neck supple.  Cardiovascular: Normal rate and regular rhythm.   Pulmonary/Chest: Effort normal and breath sounds normal. No respiratory distress. She has no wheezes. She has no rales.  Abdominal: Soft. Bowel sounds are normal. She exhibits no distension. There is no tenderness.  Neurological: She is alert and oriented to  person, place, and time.  Skin: Skin is warm and dry.  Psychiatric: She has a normal mood and affect.    ED Course  Procedures (including critical care time)  Pt here for alcohol and ativan detox. Pt currently in NAD. Last drink this morning. Pt does not appear to be actively withdrawing. Will get screening labs and speak with ACT  5:50 PM Spoke with ACT team, will assess.   Results for orders placed during the hospital encounter of 07/13/11  CBC      Component Value Range   WBC 7.5  4.0 - 10.5 K/uL   RBC 4.35  3.87 - 5.11 MIL/uL   Hemoglobin 13.1  12.0 - 15.0 g/dL   HCT 16.1  09.6 - 04.5 %   MCV 87.8  78.0 - 100.0 fL   MCH 30.1  26.0 - 34.0 pg   MCHC 34.3  30.0 - 36.0 g/dL   RDW 40.9  81.1 - 91.4 %   Platelets 299  150 - 400 K/uL  COMPREHENSIVE METABOLIC PANEL      Component Value Range   Sodium 142  135 - 145 mEq/L   Potassium 3.2 (*) 3.5 - 5.1 mEq/L   Chloride 105  96 - 112 mEq/L   CO2 25  19 - 32 mEq/L   Glucose, Bld 87  70 - 99 mg/dL   BUN 9  6 - 23 mg/dL   Creatinine, Ser 7.82  0.50 - 1.10 mg/dL   Calcium 9.1  8.4 - 95.6 mg/dL   Total Protein 7.5  6.0 - 8.3 g/dL   Albumin 4.1  3.5 - 5.2 g/dL   AST 26  0 - 37 U/L   ALT 22  0 - 35 U/L   Alkaline Phosphatase 39  39 - 117 U/L   Total Bilirubin 0.1 (*) 0.3 - 1.2 mg/dL   GFR calc non Af Amer >90  >90 mL/min   GFR calc Af Amer >90  >90 mL/min  ETHANOL      Component Value Range   Alcohol, Ethyl (B) 118 (*) 0 - 11 mg/dL  ACETAMINOPHEN LEVEL      Component Value Range   Acetaminophen (Tylenol), Serum <15.0  10 - 30 ug/mL  URINE RAPID DRUG SCREEN (HOSP PERFORMED)      Component Value Range   Opiates NONE DETECTED  NONE DETECTED   Cocaine NONE DETECTED  NONE DETECTED   Benzodiazepines NONE DETECTED  NONE DETECTED   Amphetamines NONE DETECTED  NONE DETECTED   Tetrahydrocannabinol NONE DETECTED  NONE DETECTED   Barbiturates NONE DETECTED  NONE DETECTED  POCT PREGNANCY, URINE      Component Value Range   Preg Test,  Ur NEGATIVE  NEGATIVE   No results found.   Pt to be assessed by ACT, will  See if pt can get placed inpatient. Pt denies SI or HI.    1. Alcohol abuse   2. Alcohol dependence   3. Benzodiazepine dependence       MDM          Lottie Mussel, PA 07/16/11 [phone]

## 2011-07-14 ENCOUNTER — Encounter (HOSPITAL_COMMUNITY): Payer: Self-pay

## 2011-07-14 ENCOUNTER — Inpatient Hospital Stay (HOSPITAL_COMMUNITY)
Admission: EM | Admit: 2011-07-14 | Discharge: 2011-07-18 | DRG: 897 | Disposition: A | Discharge status: Home / Self Care | Payer: 59 | Source: Ambulatory Visit | Attending: Psychiatry | Admitting: Psychiatry

## 2011-07-14 DIAGNOSIS — F10239 Alcohol dependence with withdrawal, unspecified: Principal | ICD-10-CM | POA: Diagnosis present

## 2011-07-14 DIAGNOSIS — F1011 Alcohol abuse, in remission: Secondary | ICD-10-CM | POA: Diagnosis present

## 2011-07-14 DIAGNOSIS — F1320 Sedative, hypnotic or anxiolytic dependence, uncomplicated: Secondary | ICD-10-CM | POA: Diagnosis present

## 2011-07-14 DIAGNOSIS — I10 Essential (primary) hypertension: Secondary | ICD-10-CM | POA: Diagnosis present

## 2011-07-14 DIAGNOSIS — F1020 Alcohol dependence, uncomplicated: Secondary | ICD-10-CM | POA: Diagnosis present

## 2011-07-14 DIAGNOSIS — F10939 Alcohol use, unspecified with withdrawal, unspecified: Principal | ICD-10-CM | POA: Diagnosis present

## 2011-07-14 DIAGNOSIS — F1994 Other psychoactive substance use, unspecified with psychoactive substance-induced mood disorder: Secondary | ICD-10-CM | POA: Diagnosis present

## 2011-07-14 DIAGNOSIS — IMO0001 Reserved for inherently not codable concepts without codable children: Secondary | ICD-10-CM

## 2011-07-14 DIAGNOSIS — F411 Generalized anxiety disorder: Secondary | ICD-10-CM | POA: Diagnosis present

## 2011-07-14 DIAGNOSIS — R12 Heartburn: Secondary | ICD-10-CM | POA: Diagnosis not present

## 2011-07-14 MED ORDER — NORETHINDRONE 0.35 MG PO TABS
1.0000 | ORAL_TABLET | Freq: Every day | ORAL | Status: DC
Start: 2011-07-14 — End: 2011-07-18
  Administered 2011-07-14 – 2011-07-18 (×5): 0.35 mg via ORAL
  Filled 2011-07-14 (×6): qty 1

## 2011-07-14 MED ORDER — VALACYCLOVIR HCL 500 MG PO TABS
1000.0000 mg | ORAL_TABLET | Freq: Every day | ORAL | Status: DC
  Administered 2011-07-14 – 2011-07-18 (×5): 1000 mg via ORAL
  Filled 2011-07-14 (×6): qty 2

## 2011-07-14 MED ORDER — ACETAMINOPHEN 325 MG PO TABS
650.0000 mg | ORAL_TABLET | Freq: Four times a day (QID) | ORAL | Status: DC | PRN
  Administered 2011-07-14: 650 mg via ORAL

## 2011-07-14 MED ORDER — HYDROXYZINE HCL 25 MG PO TABS
25.0000 mg | ORAL_TABLET | Freq: Four times a day (QID) | ORAL | Status: AC | PRN
  Administered 2011-07-14 – 2011-07-15 (×3): 25 mg via ORAL

## 2011-07-14 MED ORDER — FAMOTIDINE 20 MG PO TABS
20.0000 mg | ORAL_TABLET | Freq: Every day | ORAL | Status: DC
  Filled 2011-07-14 (×3): qty 1

## 2011-07-14 MED ORDER — ALUM & MAG HYDROXIDE-SIMETH 200-200-20 MG/5ML PO SUSP: 30.0000 mL | ORAL | Status: DC | PRN

## 2011-07-14 MED ORDER — METOPROLOL SUCCINATE ER 50 MG PO TB24
50.0000 mg | ORAL_TABLET | Freq: Every day | ORAL | Status: DC
  Administered 2011-07-14 – 2011-07-18 (×5): 50 mg via ORAL
  Filled 2011-07-14 (×6): qty 1

## 2011-07-14 MED ORDER — CHLORDIAZEPOXIDE HCL 25 MG PO CAPS
25.0000 mg | ORAL_CAPSULE | Freq: Every day | ORAL | Status: AC
  Administered 2011-07-17: 25 mg via ORAL
  Filled 2011-07-14: qty 1

## 2011-07-14 MED ORDER — ADULT MULTIVITAMIN W/MINERALS CH
1.0000 | ORAL_TABLET | Freq: Every day | ORAL | Status: DC
  Administered 2011-07-14 – 2011-07-16 (×3): 1 via ORAL
  Administered 2011-07-17: 08:00:00 via ORAL
  Administered 2011-07-18: 1 via ORAL
  Filled 2011-07-14 (×6): qty 1

## 2011-07-14 MED ORDER — CHLORDIAZEPOXIDE HCL 25 MG PO CAPS
25.0000 mg | ORAL_CAPSULE | Freq: Four times a day (QID) | ORAL | Status: AC
  Administered 2011-07-14 (×4): 25 mg via ORAL
  Filled 2011-07-14 (×4): qty 1

## 2011-07-14 MED ORDER — ONDANSETRON 4 MG PO TBDP
4.0000 mg | ORAL_TABLET | Freq: Four times a day (QID) | ORAL | Status: AC | PRN
  Administered 2011-07-14: 4 mg via ORAL

## 2011-07-14 MED ORDER — THIAMINE HCL 100 MG/ML IJ SOLN
100.0000 mg | Freq: Once | INTRAMUSCULAR | Status: AC
  Administered 2011-07-14: 07:00:00 via INTRAMUSCULAR

## 2011-07-14 MED ORDER — CHLORDIAZEPOXIDE HCL 25 MG PO CAPS
25.0000 mg | ORAL_CAPSULE | Freq: Three times a day (TID) | ORAL | Status: AC
  Administered 2011-07-15 (×3): 25 mg via ORAL
  Filled 2011-07-14 (×2): qty 1

## 2011-07-14 MED ORDER — LOPERAMIDE HCL 2 MG PO CAPS: 2.0000 mg | ORAL_CAPSULE | ORAL | Status: AC | PRN

## 2011-07-14 MED ORDER — CHLORTHALIDONE 25 MG PO TABS
25.0000 mg | ORAL_TABLET | Freq: Every day | ORAL | Status: DC
  Administered 2011-07-14 – 2011-07-18 (×5): 25 mg via ORAL
  Filled 2011-07-14 (×6): qty 1

## 2011-07-14 MED ORDER — CHLORDIAZEPOXIDE HCL 25 MG PO CAPS
25.0000 mg | ORAL_CAPSULE | Freq: Once | ORAL | Status: AC
  Administered 2011-07-14: 25 mg via ORAL
  Filled 2011-07-14: qty 1

## 2011-07-14 MED ORDER — CHLORDIAZEPOXIDE HCL 25 MG PO CAPS
25.0000 mg | ORAL_CAPSULE | ORAL | Status: AC
  Administered 2011-07-16 (×2): 25 mg via ORAL
  Filled 2011-07-14 (×3): qty 1

## 2011-07-14 MED ORDER — TRAZODONE HCL 50 MG PO TABS
50.0000 mg | ORAL_TABLET | Freq: Every evening | ORAL | Status: DC | PRN
  Administered 2011-07-14 – 2011-07-17 (×4): 50 mg via ORAL
  Filled 2011-07-14: qty 1
  Filled 2011-07-14: qty 14
  Filled 2011-07-14 (×3): qty 1

## 2011-07-14 MED ORDER — VITAMIN B-1 100 MG PO TABS
100.0000 mg | ORAL_TABLET | Freq: Every day | ORAL | Status: DC
  Administered 2011-07-15 – 2011-07-18 (×4): 100 mg via ORAL
  Filled 2011-07-14 (×5): qty 1

## 2011-07-14 MED ORDER — CHLORDIAZEPOXIDE HCL 25 MG PO CAPS
25.0000 mg | ORAL_CAPSULE | Freq: Four times a day (QID) | ORAL | Status: AC | PRN
  Administered 2011-07-14 – 2011-07-15 (×2): 25 mg via ORAL
  Filled 2011-07-14 (×2): qty 1

## 2011-07-14 MED ORDER — MAGNESIUM HYDROXIDE 400 MG/5ML PO SUSP: 30.0000 mL | Freq: Every day | ORAL | Status: DC | PRN

## 2011-07-14 NOTE — H&P (Signed)
Psychiatric Admission Assessment Adult  Patient Identification:  Cheryl Taylor  Date of Evaluation:  07/14/2011  Chief Complaint:  alcohol dependence  History of Present Illness: This is a 46 year old Caucasian female, admitted to [facility] from the [facility] ED with complaints of excessive alcohol use. Patient reports, "I am an alcoholic. I drink a lot, 2-3 bottles of wine a night. On weekends, I drink even more. I have been drinking x 20 years. I have a stressful job. It is the reason why I drink. The demand of this job is enormous and overwhelming since I was promoted to a much higher rank my work. I just think that I cannot keep up with the demand of this job. After each stressful day at work, I will go home and drink to turn work off of my head. I drink to turn my brain off because it is constantly going and going. It is all about work related issues that my brain is constantly rehearsing. I don't sleep well as a result. My marriage is suffering. I have a 50 year old daughter, my husband is now the only one doing things with her. He takes her to places, helps with home etc. I know my husband is frustrated with me. I have another sister who also is an alcoholic, a brother who is schizophrenic and he is not doing too well. I work with 3 other men for [employer] who are about the same age as me, but in the management, they higher up than me. I don't fit in with them. They are their own buddies. I feel like I can't match up and or compete with these men who are stuck in their own ways. I am not depressed, suicidal or hearing voices. But I do get anxious. Right now, I'm going through withdrawal symptoms of alcohol. I am having racing headaches, muscle cramps and I feel like bugs are crawling all over me".  ROS: Ms. Bartok alert and oriented x 4. Patient at this time denies any SOB, chest pains and or discomforts. Skin is without any significant signs of breakdown.  Mood Symptoms:   Sadness,  Depression Symptoms:  feelings of worthlessness/guilt,  (Hypo) Manic Symptoms:  Irritable Mood,  Anxiety Symptoms:  Excessive Worry,  Psychotic Symptoms:  Hallucinations: None  PTSD Symptoms: Had a traumatic exposure:  None  Past Psychiatric History: Diagnosis: Alcohol abuse  Hospitalizations: [facility]  Outpatient Care: Dr. Rogelia Rohrer  Substance Abuse Care: None reported  Self-Mutilation: None reported  Suicidal Attempts: Denies thoughts and or attempts  Violent Behaviors: None reported   Past Medical History:   Past Medical History  Diagnosis Date  . Depression   . Anxiety   . Tachycardia 04/20/2010  . Alcohol abuse 04/20/2010  . Vitamin d deficiency 04/20/2010  . HTN (hypertension) 06/07/2010  . Chest pain, atypical 06/07/2010  . Diarrhea 06/21/2010  . High risk sexual behavior 06/21/2010  . Poor concentration 12/19/2010     Allergies:  No Known Allergies  PTA Medications: Prescriptions prior to admission  Medication Sig Dispense Refill  . chlorthalidone (HYGROTON) 25 MG tablet TAKE 1 TABLET DAILY  90 tablet  0  . LORazepam (ATIVAN) 1 MG tablet 1-2 tabs po bid prn anxiety/alcohol withdrawal  90 tablet  2  . metoprolol (TOPROL-XL) 50 MG 24 hr tablet Take 1 tablet (50 mg total) by mouth daily.  90 tablet  2  . norethindrone (MICRONOR,CAMILA,ERRIN) 0.35 MG tablet Take 1 tablet by mouth daily.        Marland Kitchen  valACYclovir (VALTREX) 1000 MG tablet Take 1 tablet (1,000 mg total) by mouth daily.  90 tablet  3  . ranitidine (ZANTAC) 150 MG tablet Take 1 tablet (150 mg total) by mouth 2 (two) times daily as needed for heartburn.  60 tablet  1      Substance Abuse History in the last 12 months: Substance Age of 1st Use Last Use Amount Specific Type  Nicotine Denies use     Alcohol 21 Prior to hosp 3 bottles of beer every night Beer, but  progressing to using hard liquor  Cannabis Denies use     Opiates Denies use     Cocaine Denies use     Methamphetamines Denies use     LSD  Denies use     Ecstasy Denies use     Benzodiazepines Denies use     Caffeine      Inhalants      Others:                         Consequences of Substance Abuse: Medical Consequences:  Liver damage Legal Consequences:  Arrests, jail time Family Consequences:  family discord  Social History: Current Place of Residence: [place], [place]  Place of Birth: [place], [place]   Family Members: "My husband and daughter"  Marital Status:  Married  Children:1  Sons:0  Daughters:1  Relationships: "I'm married"  Education:  Acupuncturist Problems/Performance: None reorted  Religious Beliefs/Practices: None reported  History of Abuse (Emotional/Phsycial/Sexual): none reported  Occupational Experiences: Employed  Hotel manager History:  None.  Legal History: None reported  Hobbies/Interests: None reported  Family History:   Family History  Problem Relation Age of Onset  . Kidney failure Father 21    deceased  . Kidney disease Father   . Obesity Father   . Seizures Father   . Heart disease Father 60  . Schizophrenia Brother     52yo  . Alcohol abuse Brother   . Other Brother     chronic pain, 57 yo  . Alcohol abuse Sister     57yo  . Obesity Paternal Grandfather     Mental Status Examination/Evaluation: Objective:  Appearance: Casual  Eye Contact::  Good  Speech:  Clear and Coherent  Volume:  Normal  Mood:  Anxious and Irritable  Affect:  Flat and Tearful  Thought Process:  Coherent and Intact  Orientation:  Full  Thought Content:  Rumination  Suicidal Thoughts:  No  Homicidal Thoughts:  No  Memory:  Immediate;   Good Recent;   Good Remote;   Good  Judgement:  Poor  Insight:  Fair  Psychomotor Activity:  Restlessness and "I feel like bugs crawling on me, racing headaches"  Concentration:  Fair  Recall:  Good  Akathisia:  No  Handed:  Right  AIMS (if indicated):     Assets:  Desire for Improvement  Sleep:  Number of Hours: 0      Laboratory/X-Ray: None Psychological Evaluation(s)      Assessment:    AXIS I:  Alcohol Abuse AXIS II:  Deferred AXIS III:   Past Medical History  Diagnosis Date  . Depression   . Anxiety   . Tachycardia 04/20/2010  . Alcohol abuse 04/20/2010  . Vitamin d deficiency 04/20/2010  . HTN (hypertension) 06/07/2010  . Chest pain, atypical 06/07/2010  . Diarrhea 06/21/2010  . High risk sexual behavior 06/21/2010  . Poor concentration 12/19/2010   AXIS IV:  occupational  problems and Alcohol abuse and dependency AXIS V:  41-50 serious symptoms  Treatment Plan/Recommendations: Admit for safety and stabilization. Review and reinstate any pertinent home medication for other health conditions. Initiate Librium protocol for alcohol detoxification. Re-order Valtrex 1000 mg for herpes infection,  Metoprolol 50 mg daily for HTN, Norethindrone 0.35 mg daily for birth control method, Pepcid 20 mg for acid reflux. Hygroton 25 mg daily for HTN Continue current treatment plan.   Treatment Plan Summary: Daily contact with patient to assess and evaluate symptoms and progress in treatment Medication management  Current Medications:  Current Facility-Administered Medications  Medication Dose Route Frequency Provider Last Rate Last Dose  . acetaminophen (TYLENOL) tablet 650 mg  650 mg Oral Q6H PRN Jorje Guild, PA-C      . alum & mag hydroxide-simeth (MAALOX/MYLANTA) 200-200-20 MG/5ML suspension 30 mL  30 mL Oral Q4H PRN Jorje Guild, PA-C      . chlordiazePOXIDE (LIBRIUM) capsule 25 mg  25 mg Oral Q6H PRN Jorje Guild, PA-C      . chlordiazePOXIDE (LIBRIUM) capsule 25 mg  25 mg Oral Once Jorje Guild, PA-C   25 mg at 07/14/11 0659  . chlordiazePOXIDE (LIBRIUM) capsule 25 mg  25 mg Oral QID Jorje Guild, PA-C   25 mg at 07/14/11 0848   Followed by  . chlordiazePOXIDE (LIBRIUM) capsule 25 mg  25 mg Oral TID Jorje Guild, PA-C       Followed by  . chlordiazePOXIDE (LIBRIUM) capsule 25 mg  25 mg Oral BH-qamhs Jorje Guild, PA-C       Followed by  . chlordiazePOXIDE (LIBRIUM) capsule 25 mg  25 mg Oral Daily Jorje Guild, PA-C      . famotidine (PEPCID) tablet 20 mg  20 mg Oral Daily Sanjuana Kava, NP      . hydrOXYzine (ATARAX/VISTARIL) tablet 25 mg  25 mg Oral Q6H PRN Jorje Guild, PA-C      . loperamide (IMODIUM) capsule 2-4 mg  2-4 mg Oral PRN Jorje Guild, PA-C      . magnesium hydroxide (MILK OF MAGNESIA) suspension 30 mL  30 mL Oral Daily PRN Jorje Guild, PA-C      . metoprolol succinate (TOPROL-XL) 24 hr tablet 50 mg  50 mg Oral Daily Sanjuana Kava, NP      . multivitamin with minerals tablet 1 tablet  1 tablet Oral Daily Jorje Guild, PA-C   1 tablet at 07/14/11 0848  . norethindrone (MICRONOR,CAMILA,ERRIN) 0.35 MG tablet 0.35 mg  1 tablet Oral Daily Sanjuana Kava, NP      . ondansetron (ZOFRAN-ODT) disintegrating tablet 4 mg  4 mg Oral Q6H PRN Jorje Guild, PA-C      . thiamine (B-1) injection 100 mg  100 mg Intramuscular Once Jorje Guild, PA-C      . thiamine (VITAMIN B-1) tablet 100 mg  100 mg Oral Daily Jorje Guild, PA-C      . traZODone (DESYREL) tablet 50 mg  50 mg Oral QHS PRN Jorje Guild, PA-C      . valACYclovir (VALTREX) tablet 1,000 mg  1,000 mg Oral Daily Sanjuana Kava, NP       Facility-Administered Medications Ordered in Other Encounters  Medication Dose Route Frequency Provider Last Rate Last Dose  . DISCONTD: ibuprofen (ADVIL,MOTRIN) tablet 600 mg  600 mg Oral Q8H PRN Tatyana A Kirichenko, PA   600 mg at 07/13/11 1834  . DISCONTD: ondansetron (ZOFRAN) tablet 4 mg  4 mg Oral Q8H PRN Tatyana A  Kirichenko, PA      . DISCONTD: zolpidem (AMBIEN) tablet 5 mg  5 mg Oral QHS PRN Tatyana A Kirichenko, PA   5 mg at 07/13/11 2215    Observation Level/Precautions:  Q 15 minute checks for safety.  Laboratory:  Per Ed lab findings: ETOH levels:118  Psychotherapy: Group  Medications:  See lists  Routine PRN Medications:  Yes  Consultations: None indicated at this time   Discharge Concerns: Safety   Other:      Sanjuana Kava 6/13/201311:09 AM

## 2011-07-14 NOTE — Progress Notes (Signed)
07/14/2011         Time: 1415      Group Topic/Focus: The focus of this group is on enhancing the patient's understanding of leisure, barriers to leisure, and the importance of engaging in positive leisure activities upon discharge for improved total health.   Participation Level: Active  Participation Quality: Appropriate and Attentive  Affect: Appropriate  Cognitive: Oriented   Additional Comments: Patient able to identify positive leisure activities to engage in upon discharge. 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    Daniel Ritthaler 07/14/2011 4:09 PM

## 2011-07-14 NOTE — Progress Notes (Signed)
Pt. States she is feeling a little nervous. Given her scheduled meds as ordered. cotracts for safety. No SI or HI.pt in the dayroom with the other pts.

## 2011-07-14 NOTE — Progress Notes (Signed)
[facility] Group Notes:  (Counselor/Nursing/MHT/Case Management/Adjunct)  07/14/2011 10:39 PM  Type of Therapy:  Group Therapy at 11:00  Participation Level:  Active  Participation Quality:  Appropriate, Attentive and Sharing  Affect:  Appropriate  Cognitive:  Alert and Oriented  Insight:  Good  Engagement in Group:  Good  Engagement in Therapy:  Limited  Modes of Intervention:  Clarification, Socialization and Support  Summary of Progress/Problems:  Focus of group processing discussion was on balance in life; the components in life which have a negative influence on balance and the components which make for a more balanced life.  Patient shared  first what brought her into hospital, alcohol and later added to discussion.  Cheryl Taylor was the first to say that alcohol belonged in the negative influences category.  This led to a broader discussion of negative impacts of abuse/addiction.     [facility] Group Notes:  (Counselor/Nursing/MHT/Case Management/Adjunct)  07/14/2011 10:46 PM  Type of Therapy:  Group Therapy at 1:15  Participation Level:  Did Not Attend  Cheryl Taylor 07/14/2011, 10:46 PM

## 2011-07-14 NOTE — Progress Notes (Signed)
Pt attended discharge planning group and actively participated.  Pt presents with calm mood and affect.  Pt was open with sharing reason for entering the hospital.  Pt states that she has been drinking heavily and using her prescribed Ativan.  Pt states that she was drinking all day when she wasn't working.  Pt states that Ativan was prescribed from her PCP to help stop drinking but it didn't help.  Pt states that she came to the hospital to get help with stopping.  Pt states that she has not thought about going to rehab at this time but is open to considering it.  Pt states that she lives in [place] with her husband and 48 year old daughter. Pt denies having depression, anxiety and SI today.  Pt discussed being so depressed at home and not being able to care for the home.  SW will assess for appropriate referrals.  No further needs voiced by pt at this time.  Safety planning and suicide prevention discussed.     Cheryl Taylor, LCSWA 07/14/2011  10:01 AM

## 2011-07-14 NOTE — Progress Notes (Signed)
Patient ID: Cheryl Taylor, female   DOB: 09-29-1965, 45 y.o.   MRN: [id number] Pt is awake and active on the unit this AM. Pt denies SI/HI and A/V hallucinations. Pt is participating in the milieu and is cooperative with staff. Pt states that she is dealing with stress from work related to a recent promotion and interdepartmental conflict. Pt acknowledges that she has a problem with substance abuse and that she needs to make changes. Pt seems vested in tx as well. Writer encouraged pt to keep an open mind about AA as a support group even though she claims to be an atheist. Pt states that she will and she has a therapist outside that she can work with too. Pt mood and affect are depressed but she is attending groups. Writer will continue to monitor.

## 2011-07-14 NOTE — [facility] Suicide Risk Assessment (Signed)
Suicide Risk Assessment  Admission Assessment     Demographic factors:  See chart.  Current Mental Status:  Current Mental Status:  (Denies SI)  Patient seen and evaluated. Chart reviewed. Patient stated that her mood was "good". Her affect was mood congruent and slightly anxious. She denied any current thoughts of self injurious behavior, suicidal ideation or homicidal ideation. There were no auditory or visual hallucinations, paranoia, delusional thought processes, or mania noted.  Thought process was linear and goal directed. Speech was normal rate, tone and volume. Eye contact was good. Judgment and insight are fair.  Patient has been up and engaged on the unit.  No acute safety concerns reported from team.    Loss Factors: Denied   Historical Factors: Family history of mental illness or substance abuse  Risk Reduction Factors: Responsible for children under 95 years of age;Sense of responsibility to family;Employed;Living with another person, especially a relative;Positive social support  CLINICAL FACTORS: Alcohol and Benzodiazepine Dependence & W/D; SIMD; r/o GAD  COGNITIVE FEATURES THAT CONTRIBUTE TO RISK: none.  SUICIDE RISK: Patient is currently viewed as a low risk of harm to herself and others in light of her history and risk factors.   PLAN OF CARE: Pt admitted for crisis stabilization, detox and treatment.  Please see orders.   Medications reviewed with pt and medication education provided. Will continue q15 minute checks per unit protocol.  No clinical indication for one on one level of observation at this time.  Pt contracting for safety.  Mental health treatment, medication management and continued sobriety will mitigate against the increased risk of harm to self and/or others.  Discussed the importance of recovery with pt, as well as, tools to move forward in a healthy & safe manner.  Pt agreeable with the plan.  Discussed with the team.   Lupe Carney 07/14/2011,  4:46 PM

## 2011-07-14 NOTE — Tx Team (Signed)
Initial Interdisciplinary Treatment Plan  PATIENT STRENGTHS: (choose at least two) Above average intellegence Ability for Insight Positive support system  PATIENT STRESSORS: Occupational concerns Substance abuse   PROBLEM LIST: Problem List/Patient Goals Date to be addressed Date deferred Reason deferred Estimated date of resolution  Substance Abuse: Etoh, Ativan (prescribed a yr ago to relieve withdrawal from ETOH) Pt now wants detox off of both  6/13                                                      DISCHARGE CRITERIA:  Improved stabilization in mood, thinking, and/or behavior Motivation to continue treatment in a less acute level of care Verbal commitment to aftercare and medication compliance Withdrawal symptoms are absent or subacute and managed without 24-hour nursing intervention  PRELIMINARY DISCHARGE PLAN: Attend 12-step recovery group  PATIENT/FAMIILY INVOLVEMENT: This treatment plan has been presented to and reviewed with the patient, [name].  The patient and family have been given the opportunity to ask questions and make suggestions.  Prabhnoor Ellenberger A 07/14/2011, 5:17 AM

## 2011-07-14 NOTE — Progress Notes (Addendum)
46 yr old female presents here this morning requesting detox from ETOH and Ativan. Pt attempted to detox off of ETOH one yr ago. Pt was prescribed Ativan to subside her withdrawal symptoms. Afterwards pt started abusing Ativan at 4-5mg  daily. Pt is currently reporting the withdraw symptoms of tingling-bug crawling sensation, mild sweating, and anxiety. Pt family is a motivation for her treatment. She is tired of passing out on the couch and missing time that could had been spent with her family. From ED report pt ETOH abuse has caused her multiple call-outs at work. Pt is denying any SI/HI. Pt was calm and cooperative during this admission. Pt has been orientated to the units policies and procedures. Pt receptive to information. Pt remains safe at this time with q87min checks

## 2011-07-15 LAB — COMPREHENSIVE METABOLIC PANEL
ALT: 22 U/L (ref 0–35)
AST: 23 U/L (ref 0–37)
Albumin: 3.9 g/dL (ref 3.5–5.2)
Alkaline Phosphatase: 41 U/L (ref 39–117)
BUN: 16 mg/dL (ref 6–23)
CO2: 28 mEq/L (ref 19–32)
Calcium: 9.4 mg/dL (ref 8.4–10.5)
Chloride: 103 mEq/L (ref 96–112)
Creatinine, Ser: 0.75 mg/dL (ref 0.50–1.10)
GFR calc Af Amer: >90 mL/min (ref >90)
GFR calc non Af Amer: >90 mL/min (ref >90)
Glucose, Bld: 87 mg/dL (ref 70–99)
Potassium: 3.5 mEq/L (ref 3.5–5.1)
Sodium: 139 mEq/L (ref 135–145)
Total Bilirubin: 0.2 mg/dL — ABNORMAL LOW (ref 0.3–1.2)
Total Protein: 7.2 g/dL (ref 6.0–8.3)

## 2011-07-15 NOTE — Progress Notes (Signed)
Psychoeducational Group Note  Date:  07/15/2011 Time:  1015  Group Topic/Focus:  Relapse Prevention Planning:   The focus of this group is to define relapse and discuss the need for planning to combat relapse.  Participation Level:  Active  Participation Quality:  Appropriate, Attentive, Sharing and Supportive  Affect:  Anxious and Appropriate  Cognitive:  Alert and Appropriate  Insight:  Good  Engagement in Group:  Good  Additional Comments:  Pt was engaged in group and shared past personal experiences, also questioned this writer about several topics on handout while going over Relapse Prevention Plan Handout with group.   Dalia Heading 07/15/2011, 2:10 PM

## 2011-07-15 NOTE — Progress Notes (Signed)
Patient ID: Cheryl Taylor, female   DOB: 26-Nov-1965, 46 y.o.   MRN: [id number]   Patient appears to be sleeping. Respirations even and non-labored. Staff will monitor.

## 2011-07-15 NOTE — Progress Notes (Signed)
[facility] Group Notes:  (Counselor/Nursing/MHT/Case Management/Adjunct)  07/15/2011 4:28 PM  Type of Therapy:  Group Therapy at 11:00  Participation Level:  Minimal  Participation Quality:  Attentive and Resistant  Affect:  Appropriate  Cognitive:  Alert and Oriented  Insight:  None shared  Engagement in Group:  Limited  Engagement in Therapy:  None as patient left group early  Modes of Intervention:  Education and Support  Summary of Progress/Problems: Cheryl Taylor was attentive to discussion on Post Acute Withdrawal Syndrome (PAWS) until another group member began to discuss effect of addiction on children to which -patient promptly got up and left.    [facility] Group Notes:  (Counselor/Nursing/MHT/Case Management/Adjunct)  07/15/2011 4:32 PM  Type of Therapy:  Group Therapy at 1:15  Participation Level:  Active  Participation Quality:  Attentive and Sharing  Affect:  Appropriate  Cognitive:  Appropriate  Insight:  Good  Engagement in Group:  Good  Engagement in Therapy:  Good  Modes of Intervention:  Clarification, Socialization and Support  Summary of Progress/Problems:Group discussion topic was humility.  Cheryl Taylor shared her hesitancy to let others know she has difficult times and then immediately shared about how much it meant to her that a coworker shared something with her and how honored and nonjudgmental she felt towards the person. Cheryl Taylor was open to possibility this might be the same for her. Cheryl Taylor also shared about her experience reading the [employer] and how well it was written.     Clide Dales 07/15/2011, 4:50 PM

## 2011-07-15 NOTE — Progress Notes (Signed)
[facility] MD Progress Note  07/15/2011 4:56 PM  S/O: Patient seen and evaluated in team. Chart reviewed. Patient stated that her mood was "good". Her affect was mood congruent and slightly anxious. She denied any current thoughts of self injurious behavior, suicidal ideation or homicidal ideation. There were no auditory or visual hallucinations, paranoia, delusional thought processes, or mania noted. Thought process was linear and goal directed. Speech was normal rate, tone and volume. Eye contact was good. Judgment and insight are fair. Patient has been up and engaged on the unit. No acute safety concerns reported from team.  Pt not interested in starting medication management for anxiety/PTSD/trauma.   Sleep:  Number of Hours: 6.75    Vital Signs:Blood pressure 157/93, pulse 63, temperature 97.1 F (36.2 C), temperature source Oral, resp. rate 18, height 5\' 3"  (1.6 m), weight 79.833 kg (176 lb).  Current Medications:    . chlordiazePOXIDE  25 mg Oral QID   Followed by  . chlordiazePOXIDE  25 mg Oral TID   Followed by  . chlordiazePOXIDE  25 mg Oral BH-qamhs   Followed by  . chlordiazePOXIDE  25 mg Oral Daily  . chlorthalidone  25 mg Oral Daily  . metoprolol succinate  50 mg Oral Daily  . multivitamin with minerals  1 tablet Oral Daily  . norethindrone  1 tablet Oral Daily  . thiamine  100 mg Oral Daily  . valACYclovir  1,000 mg Oral Daily    Lab Results:  Results for orders placed during the hospital encounter of 07/14/11 (from the past 48 hour(s))  COMPREHENSIVE METABOLIC PANEL     Status: Abnormal   Collection Time   07/15/11  6:15 AM      Component Value Range Comment   Sodium 139  135 - 145 mEq/L    Potassium 3.5  3.5 - 5.1 mEq/L    Chloride 103  96 - 112 mEq/L    CO2 28  19 - 32 mEq/L    Glucose, Bld 87  70 - 99 mg/dL    BUN 16  6 - 23 mg/dL    Creatinine, Ser 0.45  0.50 - 1.10 mg/dL    Calcium 9.4  8.4 - 40.9 mg/dL    Total Protein 7.2  6.0 - 8.3 g/dL    Albumin 3.9  3.5 -  5.2 g/dL    AST 23  0 - 37 U/L    ALT 22  0 - 35 U/L    Alkaline Phosphatase 41  39 - 117 U/L    Total Bilirubin 0.2 (*) 0.3 - 1.2 mg/dL    GFR calc non Af Amer >90  >90 mL/min    GFR calc Af Amer >90  >90 mL/min     Physical Findings: CIWA:  CIWA-Ar Total: 3   A/P: Alcohol and Benzodiazepine Dependence & W/D; PTSD; r/o Mood Disorder NOS    Pt seen and evaluated in treatment team.  Reviewed short term and long term goals, medications, current treatment in the hospital and acute/chronic safety.  Pt denied any current thoughts of self harm, suicidal ideation or homicidal ideation.  Contracted for safety on the unit.  No acute issues noted.  VS reviewed with team.  Pt agreeable with treatment plan, see orders. Continue current medications as noted above. Potential d/c on Sunday s/p completion of taper with return to home.  Lupe Carney 07/15/2011, 4:56 PM

## 2011-07-15 NOTE — Progress Notes (Signed)
[facility] Adult Inpatient Family/Significant Other Collateral Contact  Collateral Contact:  Education Completed; Cheryl Taylor at [phone] (husband) has been identified by the patient as the family member/significant other who can provide for collateral information. With written consent from the patient, the family member/significant other has been contacted and reports:   Cheryl Taylor had no additional concerns other than Cheryl Taylor's current alcohol use. He did later mention he hope she would not be put on stron psychotic medications.   Cheryl Taylor was willing to consider option of attending [facility] and schedule was put in patient's chart for him  Clinical research associate provided phone number and   [facility] Unit telephone number   Clide Dales 07/15/2011 4:53 PM
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
## 2011-07-15 NOTE — Progress Notes (Signed)
D.  Pt is a 46 year old Caucasion female admitted for detox from ETOH.  Pt was drinking 2-3 bottles of wine daily.  Pt has been up and positive for group this evening.  She denies SI/HI/hallucinations.  Originally she had thought to go without sleep medication but after re-thinking decided to go ahead and take it.  No complaints voiced, interacting appropriately within milieu.  A.  Medication given as requested, support and encouragement offered.  Pt is expecting possible discharge on Sunday.  Will continue to monitor for safety.

## 2011-07-15 NOTE — Tx Team (Signed)
Interdisciplinary Treatment Plan Update (Adult)  Date:  07/15/2011  Time Reviewed:  10:16 AM   Progress in Treatment: Attending groups: Yes Participating in groups:  Yes Taking medication as prescribed: Yes Tolerating medication:  Yes Family/Significant othe contact made:  Counselor attempting to make contact Patient understands diagnosis:  Yes Discussing patient identified problems/goals with staff:  Yes Medical problems stabilized or resolved:  Yes Denies suicidal/homicidal ideation: Yes Issues/concerns per patient self-inventory:  None identified Other: N/A  New problem(s) identified: None Identified  Reason for Continuation of Hospitalization: Anxiety Depression Medication stabilization Withdrawal symptoms  Interventions implemented related to continuation of hospitalization: mood stabilization, medication monitoring and adjustment, group therapy and psycho education, safety checks q 15 mins  Additional comments: N/A  Estimated length of stay: 3-5 days  Discharge Plan: SW will assess for appropriate referrals.   New goal(s): N/A  Review of initial/current patient goals per problem list:    1.  Goal(s): Address substance use  Met:  No  Target date: by discharge  As evidenced by: completing detox protocol and refer to appropriate treatment  2.  Goal (s): Reduce depressive and anxiety symptoms  Met:  No  Target date: by discharge  As evidenced by: Reducing depression from a 10 to a 3 as reported by pt.    3.  Goal(s): Eliminate SI  Met:  No  Target date: by discharge  As evidenced by: pt denying SI   Attendees: Patient:  Cheryl Taylor 07/15/2011 10:19 AM   Family:     Physician:  Lupe Carney, DO 07/15/2011 10:16 AM   Nursing: Alease Frame, RN 07/15/2011 10:16 AM   Case Manager:  Reyes Ivan, LCSWA 07/15/2011  10:16 AM   Counselor:  Ronda Fairly, LCSWA 07/15/2011  10:16 AM   Other:  Chrisandra Netters, RN 07/15/2011 10:16 AM   Other:       Other:     Other:      Scribe for Treatment Team:   Reyes Ivan 07/15/2011 10:16 AM

## 2011-07-15 NOTE — Progress Notes (Signed)
Pt attended discharge planning group and actively participated.  Pt presents with calm mood and affect.  Pt denies having depression, anxiety and SI.  Pt states that she enjoyed karaoke last night.  Pt states that she is working on writing down a sobriety plan.  Pt states that she is used to getting off work and getting drunk.  Pt states that this is her routine and will have to work on coming up with a different routine.  Pt states that she is not a big fan off AA meetings but would consider trying again.  Pt states that she doesn't want to go to inpatient rehab.  Pt states that she sees a therapist at [facility] but can't recall her name.  SW will continue to assess for appropriate referrals.  No further needs voiced by pt at this time.    Reyes Ivan, LCSWA 07/15/2011  10:25 AM

## 2011-07-16 DIAGNOSIS — F1020 Alcohol dependence, uncomplicated: Secondary | ICD-10-CM

## 2011-07-16 DIAGNOSIS — F1320 Sedative, hypnotic or anxiolytic dependence, uncomplicated: Secondary | ICD-10-CM

## 2011-07-16 DIAGNOSIS — F4310 Post-traumatic stress disorder, unspecified: Secondary | ICD-10-CM

## 2011-07-16 DIAGNOSIS — F10239 Alcohol dependence with withdrawal, unspecified: Principal | ICD-10-CM

## 2011-07-16 MED ORDER — FAMOTIDINE 20 MG PO TABS
20.0000 mg | ORAL_TABLET | Freq: Two times a day (BID) | ORAL | Status: DC | PRN
  Administered 2011-07-16: 20 mg via ORAL
  Filled 2011-07-16: qty 1

## 2011-07-16 NOTE — Progress Notes (Signed)
Patient ID: Cheryl Taylor, female   DOB: [birth year], 46 y.o.   MRN: [id number] [facility] MD Progress Note  07/16/2011 10:29 PM  S/O: Patient seen and evaluated in team. Chart reviewed. Patient stated that her mood was fair. Sleep is ok. Feels anxious when she thinks about job stressors now. Plans to work after discharge. Plans to quitt etoh now.    Her affect was mood congruent and slightly anxious. She denied any current thoughts of self injurious behavior, suicidal ideation or homicidal ideation. There were no auditory or visual hallucinations, paranoia, delusional thought processes, or mania noted. Thought process was linear and goal directed. Speech was normal rate, tone and volume. Eye contact was good. Judgment and insight are fair. Patient has been up and engaged on the unit. No acute safety concerns reported from team.  Pt not interested in starting medication management for anxiety/PTSD/trauma.   Sleep:  Number of Hours: 6.75    Vital Signs:Blood pressure 152/81, pulse 61, temperature 97.7 F (36.5 C), temperature source Oral, resp. rate 20, height 5\' 3"  (1.6 m), weight 79.833 kg (176 lb).  Current Medications:     . chlordiazePOXIDE  25 mg Oral BH-qamhs   Followed by  . chlordiazePOXIDE  25 mg Oral Daily  . chlorthalidone  25 mg Oral Daily  . metoprolol succinate  50 mg Oral Daily  . multivitamin with minerals  1 tablet Oral Daily  . norethindrone  1 tablet Oral Daily  . thiamine  100 mg Oral Daily  . valACYclovir  1,000 mg Oral Daily    Lab Results:  Results for orders placed during the hospital encounter of 07/14/11 (from the past 48 hour(s))  COMPREHENSIVE METABOLIC PANEL     Status: Abnormal   Collection Time   07/15/11  6:15 AM      Component Value Range Comment   Sodium 139  135 - 145 mEq/L    Potassium 3.5  3.5 - 5.1 mEq/L    Chloride 103  96 - 112 mEq/L    CO2 28  19 - 32 mEq/L    Glucose, Bld 87  70 - 99 mg/dL    BUN 16  6 - 23 mg/dL    Creatinine, Ser 4.69  0.50 -  1.10 mg/dL    Calcium 9.4  8.4 - 62.9 mg/dL    Total Protein 7.2  6.0 - 8.3 g/dL    Albumin 3.9  3.5 - 5.2 g/dL    AST 23  0 - 37 U/L    ALT 22  0 - 35 U/L    Alkaline Phosphatase 41  39 - 117 U/L    Total Bilirubin 0.2 (*) 0.3 - 1.2 mg/dL    GFR calc non Af Amer >90  >90 mL/min    GFR calc Af Amer >90  >90 mL/min     Physical Findings: CIWA:  CIWA-Ar Total: 0   A/P: Alcohol and Benzodiazepine Dependence & W/D; PTSD; r/o Mood Disorder NOS    - continue current meds  Wonda Cerise 07/16/2011, 10:29 PM

## 2011-07-16 NOTE — ED Provider Notes (Signed)
Medical screening examination/treatment/procedure(s) were performed by non-physician practitioner and as supervising physician I was immediately available for consultation/collaboration.  Erice Ahles L Jeriah Corkum, MD 07/16/11 0913 

## 2011-07-16 NOTE — Progress Notes (Signed)
Patient is active on the unit and attending scheduled groups. Denies SI. Sleeping and eating well. Patient talks about future goals and how much better she feels being off ETOH. She is hoping to be discharged Monday and already has a follow up appointment with therapist on Tuesday. She shows insight into how her drinking was negatively affecting her life. Patient stated "I didn't realize that ETOH was actually making my anxiety and depression worse. Even my blood pressure has been running high." After discharge patient plans on "having a scheduled." Patient's affect is very bright. Denies withdrawal symptoms and has not required any prn's today. Remains safe on the unit.

## 2011-07-16 NOTE — Progress Notes (Signed)
Patient ID: [name], female   DOB: December 07, 1965, 46 y.o.   MRN: [id number] Pt. attended and participated in aftercare planning group. Pt. verbally accepted information on suicide prevention, warning signs to look for with suicide and crisis line numbers to use. The pt. agreed to call crisis line numbers if having warning signs or having thoughts of suicide. Pt. listed their current anxiety and depression level as good.

## 2011-07-16 NOTE — Progress Notes (Signed)
Patient ID: Cheryl Taylor, female   DOB: 1966-01-16, 46 y.o.   MRN: [id number]   [facility] Group Notes:  (Counselor/Nursing/MHT/Case Management/Adjunct)  07/16/2011 1:15 PM  Type of Therapy:  Group Therapy, Dance/Movement Therapy   Participation Level:  Active  Participation Quality:  Appropriate, Attentive and Sharing  Affect:  Appropriate  Cognitive:  Appropriate  Insight:  Good  Engagement in Group:  Good  Engagement in Therapy:  Good  Modes of Intervention:  Clarification, Problem-solving, Role-play, Socialization and Support  Summary of Progress/Problems:  Therapist began group by asking group members to share one positive thing they like to do that makes them happy. Therapist then followed up this question by asking group members how these things can be bad for [place]. Group members then discussed AA. Some group members were familiar with AA but others were not. Pt. 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 Pt. was very engaged during group and asked questions about AA and seemed interested in finding out more about it.   Cassidi Long

## 2011-07-17 MED ORDER — PANTOPRAZOLE SODIUM 40 MG PO TBEC
40.0000 mg | DELAYED_RELEASE_TABLET | Freq: Every day | ORAL | Status: DC
  Administered 2011-07-17 – 2011-07-18 (×2): 40 mg via ORAL
  Filled 2011-07-17 (×2): qty 1

## 2011-07-17 NOTE — Progress Notes (Signed)
Patient ID: Cheryl Taylor, female   DOB: 01/05/1966, 46 y.o.   MRN: [id number] 07-17-11 @ 1026 nursing shift note: this patient has been pleasant and engaging this am. She went to breakfast and has attended groups. On her inventory sheet she wrote slept well, appetite good, energy normal, attention good with her depression and hopelessness both at "0". W/d symptoms have been agitation. Denies any thoughts of suicide or hurting herself.  No physical problems. Whn she goes home she plans to go to"aa".  She doesn't see any problems staying on her meds after discharge. Pt was started on protonix. rn will monitor and q 15 min continue.

## 2011-07-17 NOTE — Progress Notes (Signed)
Patient ID: Cheryl Taylor, female   DOB: [birth year], 46 y.o.   MRN: [id number]   [facility] Group Notes:  (Counselor/Nursing/MHT/Case Management/Adjunct)  07/17/2011 1:15 PM  Type of Therapy:  Group Therapy, Dance/Movement Therapy   Participation Level:  Active  Participation Quality:  Appropriate  Affect:  Appropriate  Cognitive:  Appropriate  Insight:  Good  Engagement in Group:  Good  Engagement in Therapy:  Good  Modes of Intervention:  Clarification, Problem-solving, Role-play, Socialization and Support  Summary of Progress/Problems: Pt. participated in a group discussion in viewing of the movie My Name is [name]. Pt. spoke about themes of enabling, family dynamics of the addicted family, support systems, 12 step groups and the power of addiction. Pt. was encouraged to develop a plan on how to use 12 step programs and supports upon discharge. Pt. was very active during discussion and seemed very interested to learn more about a 12 step program.      Cassidi Long

## 2011-07-17 NOTE — Progress Notes (Signed)
Patient ID: Cheryl Taylor, female   DOB: 1965-02-10, 46 y.o.   MRN: [id number] Patient ID: [name], female   DOB: 1965-12-22, 46 y.o.   MRN: [id number] [facility] MD Progress Note  07/17/2011 9:54 PM  S/O: Patient seen and evaluated in team. Chart reviewed. Good mood. Detox going well. Complain about heart burn and thinks it is GERD.     Her affect was mood congruent and slightly anxious. She denied any current thoughts of self injurious behavior, suicidal ideation or homicidal ideation. There were no auditory or visual hallucinations, paranoia, delusional thought processes, or mania noted. Thought process was linear and goal directed. Speech was normal rate, tone and volume. Eye contact was good. Judgment and insight are fair. Patient has been up and engaged on the unit. No acute safety concerns reported from team.  Pt not interested in starting medication management for anxiety/PTSD/trauma.   Sleep:  Number of Hours: 6.5    Vital Signs:Blood pressure 118/79, pulse 88, temperature 97.8 F (36.6 C), temperature source Oral, resp. rate 18, height 5\' 3"  (1.6 m), weight 79.833 kg (176 lb).  Current Medications:     . chlordiazePOXIDE  25 mg Oral Daily  . chlorthalidone  25 mg Oral Daily  . metoprolol succinate  50 mg Oral Daily  . multivitamin with minerals  1 tablet Oral Daily  . norethindrone  1 tablet Oral Daily  . pantoprazole  40 mg Oral Q1200  . thiamine  100 mg Oral Daily  . valACYclovir  1,000 mg Oral Daily    Lab Results:  No results found for this or any previous visit (from the past 48 hour(s)).  Physical Findings: CIWA:  CIWA-Ar Total: 2   A/P: Alcohol and Benzodiazepine Dependence & W/D; PTSD; r/o Mood Disorder NOS    - will add protonix on her request for heart burn  Wonda Cerise 07/17/2011, 9:54 PM

## 2011-07-17 NOTE — Progress Notes (Signed)
Lying quietly in bed with eyes closed.  Safety checks are being conducted Q 15 minutes. 

## 2011-07-17 NOTE — Progress Notes (Signed)
Patient ID: Cheryl Taylor, female   DOB: 24-Nov-1965, 46 y.o.   MRN: [id number]  Pt. attended and participated in aftercare planning group. Pt. verbally accepted suicide prevention information. Pt. reported that her anxiety level was 2 and her depression level was 0. Pt. was active during the 12 promises discussion but somewhat challenged them by stating, "these are some big promises."

## 2011-07-18 MED ORDER — RANITIDINE HCL 150 MG PO TABS: 150.0000 mg | ORAL_TABLET | Freq: Two times a day (BID) | ORAL | Status: DC | PRN

## 2011-07-18 MED ORDER — VALACYCLOVIR HCL 1 G PO TABS: 1000.0000 mg | ORAL_TABLET | Freq: Every day | ORAL | Status: DC

## 2011-07-18 MED ORDER — NORETHINDRONE 0.35 MG PO TABS: 1.0000 | ORAL_TABLET | Freq: Every day | ORAL | Status: DC

## 2011-07-18 MED ORDER — CHLORTHALIDONE 25 MG PO TABS: 25.0000 mg | ORAL_TABLET | Freq: Every day | ORAL | Status: DC

## 2011-07-18 MED ORDER — TRAZODONE HCL 50 MG PO TABS: 50.0000 mg | ORAL_TABLET | Freq: Every evening | ORAL | Status: DC | PRN

## 2011-07-18 MED ORDER — METOPROLOL SUCCINATE ER 50 MG PO TB24: 50.0000 mg | ORAL_TABLET | Freq: Every day | ORAL | Status: DC

## 2011-07-18 NOTE — Progress Notes (Signed)
[facility] Case Management Discharge Plan:  Will you be returning to the same living situation after discharge: Yes,  return home At discharge, do you have transportation home?:Yes,  has transportation home Do you have the ability to pay for your medications:Yes,  access to meds  Release of information consent forms completed and in the chart;  Patient's signature needed at discharge.  Patient to Follow up at:  Follow-up Information    Follow up with [facility] on 07/19/2011. (Appointment scheduled at 12:00 pm with Judithe Modest)    Contact information:   **Appointment is at [facility] office [address] [place], [place] [address]  phone: [phone] fax:[phone]        Follow up with AA meetings. (See brochure for meeting times and locations)          Patient denies SI/HI:   Yes,  denies SI/HI    Safety Planning and Suicide Prevention discussed:  Yes,  discussed with pt today  Barrier to discharge identified:No.  Summary and Recommendations: Pt attended discharge planning group and actively participated.  Pt presents with calm mood and affect.  Pt denies having depression, anxiety and SI.  Pt reports feeling stable to d/c today.  No recommendations from SW.  No further needs voiced by pt.  Pt stable to discharge.     Carmina Miller 07/18/2011, 10:06 AM

## 2011-07-18 NOTE — Progress Notes (Addendum)
D: Pt discharged per MD order. Pt given education regarding follow-up appointments and medications and also was given a list of AA meetings to attend. Pt denies SI/HI and auditory/visual hallucinations at this time. A: Pt was escorted by RN to lobby. Pt denied any further questions. R: Pt was cooperative and appropriate upon discharge.

## 2011-07-18 NOTE — Progress Notes (Addendum)
Patient ID: Cheryl Taylor, female   DOB: [birth year], 46 y.o.   MRN: [id number] 07-18-11 nursing shift note: D: rn and pt was in treatment team today with other staff. A:  M.D. Felt that pt is ready for discharge. pt's only request was that staff call hospital security to take her to her car. rn call security and made them aware of the pts need. R: pt thanked staff for all they care and stated she was ready for discharge. [place] b, rn will be doing the discharge.

## 2011-07-18 NOTE — Tx Team (Signed)
Interdisciplinary Treatment Plan Update (Adult)  Date: 07/18/2011  Time Reviewed: 9:56 AM  Progress in Treatment:  Attending groups: Yes  Participating in groups: Yes  Taking medication as prescribed: Yes  Tolerating medication: Yes  Family/Significant othe contact made: Yes Patient understands diagnosis: Yes  Discussing patient identified problems/goals with staff: Yes  Medical problems stabilized or resolved: Yes  Denies suicidal/homicidal ideation: Yes  Issues/concerns per patient self-inventory: None identified  Other: N/A  New problem(s) identified: None Identified   Reason for Continuation of Hospitalization: Stable to d/c   Interventions implemented related to continuation of hospitalization: Stable to d/c   Additional comments: N/A   Estimated length of stay: D/C today   Discharge Plan: Pt will return to therapist for follow up.    New goal(s): N/A   Review of initial/current patient goals per problem list:  1. Goal(s): Address substance use  Met: Yes  Target date: by discharge  As evidenced by: completed detox protocol and referred to appropriate treatment 2. Goal (s): Reduce depressive and anxiety symptoms  Met: Yes  Target date: by discharge  As evidenced by: Reducing depression from a 10 to a 3 as reported by pt. Pt denies having depression and anxiety.  3. Goal(s): Eliminate SI  Met: Yes  Target date: by discharge  As evidenced by: Pt denies SI Attendees:  Patient: Cheryl Taylor 07/18/2011 9:58 AM   Family:    Physician: Lupe Carney, DO  07/18/2011 9:56 AM   Nursing: Isaac Laud, RN  07/18/2011 9:56 AM   Case Manager: Reyes Ivan, LCSWA  07/18/2011 9:56 AM   Counselor: Ronda Fairly, LCSWA  07/18/2011 9:56 AM   Other: Richelle Ito, LCSW  07/18/2011 9:56 AM   Other: Nestor Ramp, RN  07/18/2011 9:58 AM   Other:    Other:    Scribe for Treatment Team:  Reyes Ivan 07/18/2011 9:56 AM

## 2011-07-18 NOTE — Progress Notes (Signed)
[facility] Group Notes:  (Counselor/Nursing/MHT/Case Management/Adjunct)  07/18/2011 3:55 PM  Type of Therapy:  Group Therapy at 11:00 AM  Participation Level:  Active  Participation Quality:  Appropriate, Attentive and Sharing  Affect:  Appropriate  Cognitive:  Appropriate  Insight:  Good  Engagement in Group:  Good  Engagement in Therapy:  Good  Modes of Intervention:  Clarification, Socialization and Support  Summary of Progress/Problems: Sarin  Was attentive to group discussion focused on what patient's see as their own obstacles to recovery.  Patient shares belief that "a lot" will be difficult to deal with due to long history of alcohol use and stress from work and in relationship yet "this has been so helpful to me I almost appreciate the chance to have this experience." Ichelle offered suggestions and support to others.    Clide Dales 07/18/2011, 3:55 PM

## 2011-07-18 NOTE — Discharge Summary (Signed)
Physician Discharge Summary Note  Patient:  Cheryl Taylor is an 46 y.o., female MRN:  [id number] DOB:  01-02-1966 Patient phone:  [phone] (home)  Patient address:   [address] [place] [address],   Date of Admission:  07/14/2011 Date of Discharge: 07/18/11  Reason for Admission: Alcohol detoxification  Discharge Diagnoses: Principal Problem:  *Alcohol abuse, continuous   Axis Diagnosis:   AXIS I:  Alcohol abuse, continuous AXIS II:  Deferred AXIS III:   Past Medical History  Diagnosis Date  . Depression   . Anxiety   . Tachycardia 04/20/2010  . Alcohol abuse 04/20/2010  . Vitamin d deficiency 04/20/2010  . HTN (hypertension) 06/07/2010  . Chest pain, atypical 06/07/2010  . Diarrhea 06/21/2010  . High risk sexual behavior 06/21/2010  . Poor concentration 12/19/2010   AXIS IV:  Substance abuse issues AXIS V:  67  Level of Care:  OP  Hospital Course: This is a 46 year old Caucasian female, admitted to [facility] from the [facility] ED with complaints of excessive alcohol use. Patient reports, "I am an alcoholic. I drink a lot, 2-3 bottles of wine a night. On weekends, I drink even more. I have been drinking x 20 years. I have a stressful job. It is the reason why I drink. The demand of this job is enormous and overwhelming since I was promoted to a much higher rank my work. I just think that I cannot keep up with the demand of this job. After each stressful day at work, I will go home and drink to turn work off of my head. I drink to turn my brain off because it is constantly going and going. It is all about work related issues that my brain is constantly rehearsing. I don't sleep well as a result. My marriage is suffering. I have a 49 year old daughter, my husband is now the only one doing things with her. He takes her to places, helps with home etc. I know my husband is frustrated with me. I have another sister who also is an alcoholic, a brother who is schizophrenic  and he is not doing too well. I work with 3 other men for [employer] who are about the same age as me, but in the management, they higher up than me. I don't fit in with them. They are their own buddies. I feel like I can't match up and or compete with these men who are stuck in their own ways".   While a patient in this hospital, [name] received Librium protocol for her alcohol detoxication. Her alcohol level on admission was 118. She also was enrolled in group counseling and activities. She participated in group counseling sessions and activities as recommended.  She also attended the AA/NA meetings being offerred in this unit. Patient adamantly denies any signs and symptoms of depression upon discharge. Stated that her major issues are work related stressors. She declined to be on any antidepressants.  [name] also received medication management and monitoring for her other health issues and conditions. She tolerated all her treatment regimen without any significant adverse effects and or reactions. [name] attended treatment team meeting this am and met with her treatment team members. Her symptoms, treatment regimen and response to treatment were discussed. Patient stated that she is doing well and stable for discharge. However, she still will continue psychiatric care on outpatient basis. She will follow-up at the [facility] on 07/19/11 and will  see Ms. Judithe Modest. She will also join and will be going to weekly AA meetings being held within her community. The brochure containing the contact information provided for patient. The address, date and time for her appointment at [facility] behavioral care provided for patient.  Patient received 2 weeks worth samples of her discharge medications. Upon discharge, she adamantly denies suicidal, homicidal ideations, auditory, visual hallucinations, withdrawal symptoms and or delusional thinking. She left [facility] with all personal belongings in no  apparent distress via family transport. She is instructed to follow-up with her primary care physicians for her other health issues.     Consults:  None  Significant Diagnostic Studies:  labs: CBC with diff, CMP, Toxicology  Discharge Vitals:   Blood pressure 131/87, pulse 84, temperature 97.1 F (36.2 C), temperature source Oral, resp. rate 16, height 5\' 3"  (1.6 m), weight 79.833 kg (176 lb).  Mental Status Exam: See Mental Status Examination and Suicide Risk Assessment completed by Attending Physician prior to discharge.  Discharge destination:  Home  Is patient on multiple antipsychotic therapies at discharge:  No   Has Patient had three or more failed trials of antipsychotic monotherapy by history:  No  Recommended Plan for Multiple Antipsychotic Therapies: Na  Discharge Orders    Future Appointments: Provider: Department: Dept Phone: Center:   08/08/2011 8:15 AM [facility] [phone] None   08/15/2011 8:30 AM Bradd Canary, MD [facility] [phone] None     Medication List  As of 07/18/2011  2:52 PM   STOP taking these medications         LORazepam 1 MG tablet         TAKE these medications      Indication    chlorthalidone 25 MG tablet   Commonly known as: HYGROTON   Take 1 tablet (25 mg total) by mouth daily. For blood pressure       metoprolol succinate 50 MG 24 hr tablet   Commonly known as: TOPROL-XL   Take 1 tablet (50 mg total) by mouth daily. For blood pressure       norethindrone 0.35 MG tablet   Commonly known as: MICRONOR,CAMILA,ERRIN   Take 1 tablet (0.35 mg total) by mouth daily. For birth control       ranitidine 150 MG tablet   Commonly known as: ZANTAC   Take 1 tablet (150 mg total) by mouth 2 (two) times daily as needed for heartburn.       valACYclovir 1000 MG tablet   Commonly known as: VALTREX   Take 1 tablet (1,000 mg total) by mouth daily. For viral infection            Follow-up Information    Follow  up with [facility] on 07/19/2011. (Appointment scheduled at 12:00 pm with Judithe Modest)    Contact information:   **Appointment is at [facility] office [address] [place], [place] [address]  phone: [phone] fax:[phone]        Follow up with AA meetings. (See brochure for meeting times and locations)          Follow-up recommendations:  Activity: As tolerated. Keep all scheduled follow-up appointments as recommended.  Comments: Take all your medications as prescribed. Report to your outpatient provider any adverse effects and reactions from from your medicines promptly. Instructed and cautioned patient to abstain from use of alcohol and illegal drug use while on prescription medicines. In the event of worsening symptoms, patient is instructed  to call 911, the crisis hotline and or go the nearest to nearest ED.  SignedArmandina Stammer I 07/18/2011, 2:52 PM

## 2011-07-18 NOTE — [facility] Suicide Risk Assessment (Signed)
Suicide Risk Assessment  Discharge Assessment      Demographic factors: Caucasian  Current Mental Status Per Nursing Assessment:   On Admission:   (Denied SI) At Discharge: Pt denied any SI/HI/thoughts of self harm or acute psychiatric issues in treatment team with clinical, nursing and medical team present.  Current Mental Status Per Physician: Patient seen and evaluated. Chart reviewed. Patient stated that her mood was "beautiful". Her affect was mood congruent and euthymic. She denied any current thoughts of self injurious behavior, suicidal ideation or homicidal ideation. There were no auditory or visual hallucinations, paranoia, delusional thought processes, or mania noted.  Thought process was linear and goal directed. Speech was normal rate, tone and volume. Eye contact was good. Judgment and insight are fair.  Patient has been up and engaged on the unit.  No acute safety concerns reported from team.    Loss Factors: Denied   Historical Factors: Family history of mental illness or substance abuse  Risk Reduction Factors: Responsible for children under 63 years of age;Sense of responsibility to family;Employed;Living with another person, especially a relative;Positive social support  Discharge Diagnoses:  AXIS I:  Alcohol and Benzodiazepine Dependence; Mood Disorder NOS; GAD AXIS II:  Deferred AXIS III:   Past Medical History  Diagnosis Date  . Depression   . Anxiety   . Tachycardia 04/20/2010  . Alcohol abuse 04/20/2010  . Vitamin d deficiency 04/20/2010  . HTN (hypertension) 06/07/2010  . Chest pain, atypical 06/07/2010  . Diarrhea 06/21/2010  . High risk sexual behavior 06/21/2010  . Poor concentration 12/19/2010   AXIS IV: Moderate AXIS V: 55  Cognitive Features That Contribute To Risk: none.  Suicide Risk: Patient is currently viewed as a low risk of harm to herself and others in light of her history and risk factors.   Current Meds:    . chlorthalidone  25 mg Oral  Daily  . metoprolol succinate  50 mg Oral Daily  . multivitamin with minerals  1 tablet Oral Daily  . norethindrone  1 tablet Oral Daily  . pantoprazole  40 mg Oral Q1200  . thiamine  100 mg Oral Daily  . valACYclovir  1,000 mg Oral Daily    Plan Of Care/Follow-up recommendations: Pt seen and evaluated in treatment team. Chart reviewed.  Pt stable for and requesting discharge home.  Sees her therapist tomorrow. Pt contracting for safety and does not currently meet [place] involuntary commitment criteria for continued hospitalization against her will.  Mental health treatment and continued sobriety will mitigate against the potential increased risk of harm to self and/or others.  Discussed the importance of recovery further with pt, as well as, tools to move forward in a healthy & safe manner.  Pt agreeable with the plan.  Discussed with the team.  Please see orders, follow up appointments per AVS and full discharge summary to be completed by physician extender.  Recommend follow up with AA/NA.  Diet: Heart Healthy.  Activity: As tolerated.     Lupe Carney 07/18/2011, 1:41 PM

## 2011-07-18 NOTE — Progress Notes (Signed)
Lying quietly in bed with eyes closed.  Exhibiting normal sleep behavior when observed during Q 15 minute safety checks.

## 2011-07-19 ENCOUNTER — Ambulatory Visit (INDEPENDENT_AMBULATORY_CARE_PROVIDER_SITE_OTHER): Payer: 59 | Admitting: Licensed Clinical Social Worker

## 2011-07-19 DIAGNOSIS — F3189 Other bipolar disorder: Secondary | ICD-10-CM

## 2011-07-19 NOTE — Progress Notes (Signed)
Patient Discharge Instructions:  After Visit Summary (AVS):   Faxed to:  07/19/2011 Psychiatric Admission Assessment Note:   Faxed to:  07/19/2011 Suicide Risk Assessment - Discharge Assessment:   Faxed to:  07/19/2011 Faxed/Sent to the Next Level Care provider:  07/19/2011  Information faxed to: [facility] @ [phone]  Cheryl Taylor, 07/19/2011, 5:40 PM

## 2011-07-20 ENCOUNTER — Ambulatory Visit (INDEPENDENT_AMBULATORY_CARE_PROVIDER_SITE_OTHER): Payer: 59 | Admitting: Licensed Clinical Social Worker

## 2011-07-20 DIAGNOSIS — F3189 Other bipolar disorder: Secondary | ICD-10-CM

## 2011-07-21 ENCOUNTER — Ambulatory Visit: Payer: 59 | Admitting: Licensed Clinical Social Worker

## 2011-07-22 ENCOUNTER — Ambulatory Visit (INDEPENDENT_AMBULATORY_CARE_PROVIDER_SITE_OTHER): Payer: 59 | Admitting: Licensed Clinical Social Worker

## 2011-07-22 DIAGNOSIS — F3189 Other bipolar disorder: Secondary | ICD-10-CM

## 2011-07-27 ENCOUNTER — Ambulatory Visit: Payer: Self-pay | Admitting: Licensed Clinical Social Worker

## 2011-07-29 ENCOUNTER — Other Ambulatory Visit: Payer: Self-pay | Admitting: Family Medicine

## 2011-08-05 ENCOUNTER — Ambulatory Visit: Payer: Self-pay | Admitting: Cardiology

## 2011-08-08 ENCOUNTER — Other Ambulatory Visit: Payer: 59

## 2011-08-15 ENCOUNTER — Encounter: Payer: 59 | Admitting: Family Medicine

## 2011-09-03 ENCOUNTER — Other Ambulatory Visit: Payer: Self-pay | Admitting: Family Medicine

## 2011-10-04 ENCOUNTER — Other Ambulatory Visit: Payer: Self-pay | Admitting: Family Medicine

## 2011-10-18 ENCOUNTER — Other Ambulatory Visit: Payer: Self-pay

## 2011-10-18 MED ORDER — NORETHINDRONE 0.35 MG PO TABS: 1.0000 | ORAL_TABLET | Freq: Every day | ORAL | Status: DC

## 2011-11-04 MED ORDER — NORETHINDRONE 0.35 MG PO TABS: 1.0000 | ORAL_TABLET | Freq: Every day | ORAL | Status: DC

## 2011-11-04 NOTE — Telephone Encounter (Signed)
[facility] did not receive RX.  Resent.

## 2011-11-04 NOTE — Addendum Note (Signed)
Addended by: Luisa Dago on: 11/04/2011 08:02 AM   Modules accepted: Orders

## 2011-11-18 ENCOUNTER — Encounter: Payer: Self-pay | Admitting: Family Medicine

## 2011-12-07 ENCOUNTER — Other Ambulatory Visit: Payer: Self-pay | Admitting: Family Medicine

## 2011-12-15 ENCOUNTER — Ambulatory Visit (INDEPENDENT_AMBULATORY_CARE_PROVIDER_SITE_OTHER): Payer: 59 | Admitting: Family Medicine

## 2011-12-15 ENCOUNTER — Encounter: Payer: Self-pay | Admitting: Family Medicine

## 2011-12-15 VITALS — BP 148/92 | HR 79 | Temp 98.2°F | Ht 64.0 in | Wt 172.8 lb

## 2011-12-15 DIAGNOSIS — F329 Major depressive disorder, single episode, unspecified: Secondary | ICD-10-CM

## 2011-12-15 DIAGNOSIS — I10 Essential (primary) hypertension: Secondary | ICD-10-CM

## 2011-12-15 DIAGNOSIS — F1011 Alcohol abuse, in remission: Secondary | ICD-10-CM

## 2011-12-15 DIAGNOSIS — R209 Unspecified disturbances of skin sensation: Secondary | ICD-10-CM

## 2011-12-15 DIAGNOSIS — Z7251 High risk heterosexual behavior: Secondary | ICD-10-CM

## 2011-12-15 DIAGNOSIS — R202 Paresthesia of skin: Secondary | ICD-10-CM

## 2011-12-15 DIAGNOSIS — L989 Disorder of the skin and subcutaneous tissue, unspecified: Secondary | ICD-10-CM

## 2011-12-15 DIAGNOSIS — F3289 Other specified depressive episodes: Secondary | ICD-10-CM

## 2011-12-15 DIAGNOSIS — F32A Depression, unspecified: Secondary | ICD-10-CM

## 2011-12-15 DIAGNOSIS — E785 Hyperlipidemia, unspecified: Secondary | ICD-10-CM

## 2011-12-15 DIAGNOSIS — Z0000 Encounter for general adult medical examination without abnormal findings: Secondary | ICD-10-CM

## 2011-12-15 DIAGNOSIS — I7300 Raynaud's syndrome without gangrene: Secondary | ICD-10-CM | POA: Insufficient documentation

## 2011-12-15 LAB — CBC
HCT: 40.6 % (ref 36.0–46.0)
Hemoglobin: 13.1 g/dL (ref 12.0–15.0)
MCHC: 32.2 g/dL (ref 30.0–36.0)
MCV: 89.3 fl (ref 78.0–100.0)
Platelets: 246 10*3/uL (ref 150.0–400.0)
RBC: 4.54 Mil/uL (ref 3.87–5.11)
RDW: 13.3 % (ref 11.5–14.6)
WBC: 7.7 10*3/uL (ref 4.5–10.5)

## 2011-12-15 LAB — HEPATIC FUNCTION PANEL
ALT: 26 U/L (ref 0–35)
AST: 26 U/L (ref 0–37)
Albumin: 4.4 g/dL (ref 3.5–5.2)
Alkaline Phosphatase: 39 U/L (ref 39–117)
Bilirubin, Direct: 0 mg/dL (ref 0.0–0.3)
Total Bilirubin: 0.4 mg/dL (ref 0.3–1.2)
Total Protein: 7.4 g/dL (ref 6.0–8.3)

## 2011-12-15 LAB — RENAL FUNCTION PANEL
Albumin: 4.4 g/dL (ref 3.5–5.2)
BUN: 11 mg/dL (ref 6–23)
CO2: 25 mEq/L (ref 19–32)
Calcium: 9.1 mg/dL (ref 8.4–10.5)
Chloride: 106 mEq/L (ref 96–112)
Creatinine, Ser: 0.8 mg/dL (ref 0.4–1.2)
GFR: 87.35 mL/min (ref >60.00)
Glucose, Bld: 80 mg/dL (ref 70–99)
Phosphorus: 4 mg/dL (ref 2.3–4.6)
Potassium: 3.4 mEq/L — ABNORMAL LOW (ref 3.5–5.1)
Sodium: 139 mEq/L (ref 135–145)

## 2011-12-15 LAB — LIPID PANEL
Cholesterol: 183 mg/dL (ref 0–200)
HDL: 46.2 mg/dL (ref >39.00)
LDL Cholesterol: 118 mg/dL — ABNORMAL HIGH (ref 0–99)
Total CHOL/HDL Ratio: 4
Triglycerides: 96 mg/dL (ref 0.0–149.0)
VLDL: 19.2 mg/dL (ref 0.0–40.0)

## 2011-12-15 LAB — TSH: TSH: 0.92 u[IU]/mL (ref 0.35–5.50)

## 2011-12-15 MED ORDER — THIAMINE HCL 100 MG PO TABS: 100.0000 mg | ORAL_TABLET | Freq: Every day | ORAL | Status: DC

## 2011-12-15 MED ORDER — FOLIC ACID 1 MG PO TABS: 1.0000 mg | ORAL_TABLET | Freq: Every day | ORAL | Status: DC

## 2011-12-15 MED ORDER — METOPROLOL SUCCINATE ER 25 MG PO TB24: 25.0000 mg | ORAL_TABLET | Freq: Every day | ORAL | Status: DC

## 2011-12-15 NOTE — Assessment & Plan Note (Signed)
Start Thiamine 100 mg daily and Folic Acid 1 mg daily

## 2011-12-15 NOTE — Progress Notes (Signed)
Patient ID: Cheryl Taylor, female   DOB: 01/08/1966, 46 y.o.   MRN: [id number] Cheryl Taylor [id number] December 09, 1965 12/15/2011      Progress Note-Follow Up  Subjective  Chief Complaint  Chief Complaint  Patient presents with  . Annual Exam    physical    HPI  Patient is a 46 year-old Caucasian female who is in today for annual exam. She is doing much better. She quit drinking altogether over 5 months ago and is regularly attending AA meetings. She is doing well at work and notes her home life is much better since she stopped drinking. She had been engaging in high-risk social and sexual behaviors well but stopped that about a year ago she denies any acute complaints. She has no GI, GU or GYN concerns today. She is complaining of worsening circulation in her hands and feet. She notes especially with cold weather her fingers and toes will turn red or purple and become numb or painful. Her final complaint today is of a growing lesion between her first and second toe on her right foot. She denies trauma. His never hot or red. It is firm and uncomfortable. No history of similar lesions. She denies recent illness, headache, fevers, chills, chest pain, palpitations, shortness of breath, GI or GU complaints. Her insomnia and reflux are such that resolved since she quit drinking alcohol.  Past Medical History  Diagnosis Date  . Depression   . Anxiety   . Tachycardia 04/20/2010  . Alcohol abuse 04/20/2010  . Vitamin D deficiency 04/20/2010  . HTN (hypertension) 06/07/2010  . Chest pain, atypical 06/07/2010  . Diarrhea 06/21/2010  . High risk sexual behavior 06/21/2010  . Poor concentration 12/19/2010  . Alcohol abuse, in remission 07/14/2011  . Preventative health care 12/15/2011  . Raynaud's disease 12/15/2011  . Hyperlipidemia 12/15/2011    Past Surgical History  Procedure Date  . Cesarean section 2003  . Tonsillectomy 1980    Family History  Problem Relation Age of Onset  . Kidney failure  Father 58    deceased  . Kidney disease Father   . Obesity Father   . Seizures Father   . Heart disease Father 56  . Schizophrenia Brother     52yo  . Alcohol abuse Brother   . Other Brother     chronic pain, 48 yo  . Alcohol abuse Sister     40yo  . Obesity Paternal Grandfather     History   Social History  . Marital Status: Married    Spouse Name: N/A    Number of Children: N/A  . Years of Education: N/A   Occupational History  . Not on file.   Social History Main Topics  . Smoking status: Never Smoker   . Smokeless tobacco: Never Used  . Alcohol Use: 30.0 oz/week    50 Glasses of wine per week     Comment: 2-3 bottles of wine a night  . Drug Use: No     Comment: Ativan 4-5mg  daily  . Sexually Active: Yes -- Female partner(s)    Birth Control/ Protection: Pill   Other Topics Concern  . Not on file   Social History Narrative  . No narrative on file    Current Outpatient Prescriptions on File Prior to Visit  Medication Sig Dispense Refill  . norethindrone (MICRONOR,CAMILA,ERRIN) 0.35 MG tablet Take 1 tablet (0.35 mg total) by mouth daily. For birth control  3 Package  0  . valACYclovir (VALTREX) 1000 MG tablet  TAKE 1 TABLET DAILY  90 tablet  0    No Known Allergies  Review of Systems  Review of Systems  Constitutional: Negative for fever and malaise/fatigue.  HENT: Negative for congestion.   Eyes: Negative for discharge.  Respiratory: Negative for shortness of breath.   Cardiovascular: Negative for chest pain, palpitations and leg swelling.  Gastrointestinal: Negative for nausea, abdominal pain and diarrhea.  Genitourinary: Negative for dysuria.  Musculoskeletal: Negative for falls.  Skin: Negative for rash.  Neurological: Negative for loss of consciousness and headaches.  Endo/Heme/Allergies: Negative for polydipsia.  Psychiatric/Behavioral: Negative for depression and suicidal ideas. The patient is not nervous/anxious and does not have insomnia.      Objective  BP 148/92  Pulse 79  Temp 98.2 F (36.8 C) (Temporal)  Ht 5\' 4"  (1.626 m)  Wt 172 lb 12.8 oz (78.382 kg)  BMI 29.66 kg/m2  SpO2 100%  Physical Exam  Physical Exam  Constitutional: She is oriented to person, place, and time and well-developed, well-nourished, and in no distress. No distress.  HENT:  Head: Normocephalic and atraumatic.  Right Ear: External ear normal.  Left Ear: External ear normal.  Nose: Nose normal.  Mouth/Throat: Oropharynx is clear and moist. No oropharyngeal exudate.  Eyes: Conjunctivae normal are normal. Pupils are equal, round, and reactive to light. Right eye exhibits no discharge. Left eye exhibits no discharge. No scleral icterus.  Neck: Normal range of motion. Neck supple. No thyromegaly present.  Cardiovascular: Normal rate, regular rhythm, normal heart sounds and intact distal pulses.   No murmur heard. Pulmonary/Chest: Effort normal and breath sounds normal. No respiratory distress. She has no wheezes. She has no rales.  Abdominal: Soft. Bowel sounds are normal. She exhibits no distension and no mass. There is no tenderness.  Musculoskeletal: Normal range of motion. She exhibits no edema and no tenderness.  Lymphadenopathy:    She has no cervical adenopathy.  Neurological: She is alert and oriented to person, place, and time. She has normal reflexes. No cranial nerve deficit. Coordination normal.  Skin: Skin is warm and dry. No rash noted. She is not diaphoretic.  Psychiatric: Mood, memory and affect normal.    Lab Results  Component Value Date   TSH 0.92 12/15/2011   Lab Results  Component Value Date   WBC 7.7 12/15/2011   HGB 13.1 12/15/2011   HCT 40.6 12/15/2011   MCV 89.3 12/15/2011   PLT 246.0 12/15/2011   Lab Results  Component Value Date   CREATININE 0.8 12/15/2011   BUN 11 12/15/2011   NA 139 12/15/2011   K 3.4* 12/15/2011   CL 106 12/15/2011   CO2 25 12/15/2011   Lab Results  Component Value Date   ALT  26 12/15/2011   AST 26 12/15/2011   ALKPHOS 39 12/15/2011   BILITOT 0.4 12/15/2011   Lab Results  Component Value Date   CHOL 183 12/15/2011   Lab Results  Component Value Date   HDL 46.20 12/15/2011   Lab Results  Component Value Date   LDLCALC 118* 12/15/2011   Lab Results  Component Value Date   TRIG 96.0 12/15/2011   Lab Results  Component Value Date   CHOLHDL 4 12/15/2011     Assessment & Plan  High risk sexual behavior Doing well without this behavior in 1 1/2 years  HTN (hypertension) Restart Metoprolol XR 25 mg daily  Preventative health care Had flu shot already. Fasting labs done today. Encouraged heart healthy diet and patient agrees to  proceed with MGM at [facility].   Raynaud's disease Encouraged warm clothing and consider an check vitamin d level81 mg aspirin daily   Alcohol abuse, in remission Start Thiamine 100 mg daily and Folic Acid 1 mg daily  Depression Feels she is doing well off of meds, is starting with a new counselor this week  Hyperlipidemia Mild, avoid trans fats, start krill oil, increase exercise

## 2011-12-15 NOTE — Assessment & Plan Note (Signed)
Feels she is doing well off of meds, is starting with a new counselor this week

## 2011-12-15 NOTE — Patient Instructions (Addendum)

## 2011-12-15 NOTE — Assessment & Plan Note (Addendum)
Encouraged warm clothing and consider an check vitamin d level81 mg aspirin daily

## 2011-12-15 NOTE — Assessment & Plan Note (Signed)
Mild, avoid trans fats, start krill oil, increase exercise

## 2011-12-15 NOTE — Assessment & Plan Note (Signed)
Had flu shot already. Fasting labs done today. Encouraged heart healthy diet and patient agrees to proceed with MGM at [facility].

## 2011-12-15 NOTE — Assessment & Plan Note (Signed)
Doing well without this behavior in 1 1/2 years

## 2011-12-15 NOTE — Assessment & Plan Note (Signed)
Restart Metoprolol XR 25 mg daily

## 2012-01-05 ENCOUNTER — Other Ambulatory Visit: Payer: Self-pay | Admitting: Family Medicine

## 2012-02-02 ENCOUNTER — Other Ambulatory Visit: Payer: Self-pay | Admitting: Family Medicine

## 2012-02-02 NOTE — Telephone Encounter (Signed)
Please advise Lorazepam refill?

## 2012-02-03 NOTE — Telephone Encounter (Signed)
RX faxed

## 2012-02-06 ENCOUNTER — Other Ambulatory Visit: Payer: Self-pay | Admitting: Family Medicine

## 2012-03-17 ENCOUNTER — Other Ambulatory Visit: Payer: Self-pay | Admitting: Family Medicine

## 2012-03-20 ENCOUNTER — Other Ambulatory Visit: Payer: Self-pay | Admitting: Family Medicine

## 2012-05-18 ENCOUNTER — Encounter: Payer: Self-pay | Admitting: Internal Medicine

## 2012-05-18 ENCOUNTER — Ambulatory Visit (INDEPENDENT_AMBULATORY_CARE_PROVIDER_SITE_OTHER): Payer: 59 | Admitting: Internal Medicine

## 2012-05-18 VITALS — BP 130/88 | HR 80 | Temp 98.4°F | Resp 14 | Wt 180.1 lb

## 2012-05-18 DIAGNOSIS — M7541 Impingement syndrome of right shoulder: Secondary | ICD-10-CM

## 2012-05-18 DIAGNOSIS — L720 Epidermal cyst: Secondary | ICD-10-CM

## 2012-05-18 DIAGNOSIS — M25819 Other specified joint disorders, unspecified shoulder: Secondary | ICD-10-CM

## 2012-05-18 DIAGNOSIS — L723 Sebaceous cyst: Secondary | ICD-10-CM

## 2012-05-18 MED ORDER — CARISOPRODOL 350 MG PO TABS: 350.0000 mg | ORAL_TABLET | Freq: Two times a day (BID) | ORAL | Status: DC | PRN

## 2012-05-18 MED ORDER — MELOXICAM 15 MG PO TABS: 15.0000 mg | ORAL_TABLET | Freq: Every day | ORAL | Status: DC

## 2012-05-18 NOTE — Patient Instructions (Signed)
It was good to see you today. We have reviewed your prior records including labs and tests today Continue meloxicam anti-inflammatory daily for 2 weeks, then as needed for pain Okay to continue muscle relaxer one or 2 times daily as needed for muscle spasm Your prescription(s) have been submitted to your pharmacy. Please take as directed and contact our office if you believe you are having problem(s) with the medication(s). Let us know if the cyst on your thumb becomes larger, red, painful or other problems Followup with Dr. Abner Greenspan if needed for persisting shoulder pain or if symptoms unimproved/worse in next 2 weeks to consider steroid injection or referral to sports medicine/orthopedic specialist as needed  Impingement Syndrome, Rotator Cuff, Bursitis with Rehab Impingement syndrome is a condition that involves inflammation of the tendons of the rotator cuff and the subacromial bursa, that causes pain in the shoulder. The rotator cuff consists of four tendons and muscles that control much of the shoulder and upper arm function. The subacromial bursa is a fluid filled sac that helps reduce friction between the rotator cuff and one of the bones of the shoulder (acromion). Impingement syndrome is usually an overuse injury that causes swelling of the bursa (bursitis), swelling of the tendon (tendonitis), and/or a tear of the tendon (strain). Strains are classified into three categories. Grade 1 strains cause pain, but the tendon is not lengthened. Grade 2 strains include a lengthened ligament, due to the ligament being stretched or partially ruptured. With grade 2 strains there is still function, although the function may be decreased. Grade 3 strains include a complete tear of the tendon or muscle, and function is usually impaired. SYMPTOMS   Pain around the shoulder, often at the outer portion of the upper arm.  Pain that gets worse with shoulder function, especially when reaching overhead or  lifting.  Sometimes, aching when not using the arm.  Pain that wakes you up at night.  Sometimes, tenderness, swelling, warmth, or redness over the affected area.  Loss of strength.  Limited motion of the shoulder, especially reaching behind the back (to the back pocket or to unhook bra) or across your body.  Crackling sound (crepitation) when moving the arm.  Biceps tendon pain and inflammation (in the front of the shoulder). Worse when bending the elbow or lifting. CAUSES  Impingement syndrome is often an overuse injury, in which chronic (repetitive) motions cause the tendons or bursa to become inflamed. A strain occurs when a force is paced on the tendon or muscle that is greater than it can withstand. Common mechanisms of injury include: Stress from sudden increase in duration, frequency, or intensity of training.  Direct hit (trauma) to the shoulder.  Aging, erosion of the tendon with normal use.  Bony bump on shoulder (acromial spur). RISK INCREASES WITH:  Contact sports (football, wrestling, boxing).  Throwing sports (baseball, tennis, volleyball).  Weightlifting and bodybuilding.  Heavy labor.  Previous injury to the rotator cuff, including impingement.  Poor shoulder strength and flexibility.  Failure to warm up properly before activity.  Inadequate protective equipment.  Old age.  Bony bump on shoulder (acromial spur). PREVENTION   Warm up and stretch properly before activity.  Allow for adequate recovery between workouts.  Maintain physical fitness:  Strength, flexibility, and endurance.  Cardiovascular fitness.  Learn and use proper exercise technique. PROGNOSIS  If treated properly, impingement syndrome usually goes away within 6 weeks. Sometimes surgery is required.  RELATED COMPLICATIONS   Longer healing time if not properly  treated, or if not given enough time to heal.  Recurring symptoms, that result in a chronic condition.  Shoulder  stiffness, frozen shoulder, or loss of motion.  Rotator cuff tendon tear.  Recurring symptoms, especially if activity is resumed too soon, with overuse, with a direct blow, or when using poor technique. TREATMENT  Treatment first involves the use of ice and medicine, to reduce pain and inflammation. The use of strengthening and stretching exercises may help reduce pain with activity. These exercises may be performed at home or with a therapist. If non-surgical treatment is unsuccessful after more than 6 months, surgery may be advised. After surgery and rehabilitation, activity is usually possible in 3 months.  MEDICATION  If pain medicine is needed, nonsteroidal anti-inflammatory medicines (aspirin and ibuprofen), or other minor pain relievers (acetaminophen), are often advised.  Do not take pain medicine for 7 days before surgery.  Prescription pain relievers may be given, if your caregiver thinks they are needed. Use only as directed and only as much as you need.  Corticosteroid injections may be given by your caregiver. These injections should be reserved for the most serious cases, because they may only be given a certain number of times. HEAT AND COLD  Cold treatment (icing) should be applied for 10 to 15 minutes every 2 to 3 hours for inflammation and pain, and immediately after activity that aggravates your symptoms. Use ice packs or an ice massage.  Heat treatment may be used before performing stretching and strengthening activities prescribed by your caregiver, physical therapist, or athletic trainer. Use a heat pack or a warm water soak. SEEK MEDICAL CARE IF:   Symptoms get worse or do not improve in 4 to 6 weeks, despite treatment.  New, unexplained symptoms develop. (Drugs used in treatment may produce side effects.) EXERCISES  RANGE OF MOTION (ROM) AND STRETCHING EXERCISES - Impingement Syndrome (Rotator Cuff  Tendinitis, Bursitis) These exercises may help you when beginning  to rehabilitate your injury. Your symptoms may go away with or without further involvement from your physician, physical therapist or athletic trainer. While completing these exercises, remember:   Restoring tissue flexibility helps normal motion to return to the joints. This allows healthier, less painful movement and activity.  An effective stretch should be held for at least 30 seconds.  A stretch should never be painful. You should only feel a gentle lengthening or release in the stretched tissue. STRETCH  Flexion, Standing  Stand with good posture. With an underhand grip on your right / left hand, and an overhand grip on the opposite hand, grasp a broomstick or cane so that your hands are a little more than shoulder width apart.  Keeping your right / left elbow straight and shoulder muscles relaxed, push the stick with your opposite hand, to raise your right / left arm in front of your body and then overhead. Raise your arm until you feel a stretch in your right / left shoulder, but before you have increased shoulder pain.  Try to avoid shrugging your right / left shoulder as your arm rises, by keeping your shoulder blade tucked down and toward your mid-back spine. Hold for __________ seconds.  Slowly return to the starting position. Repeat __________ times. Complete this exercise __________ times per day. STRETCH  Abduction, Supine  Lie on your back. With an underhand grip on your right / left hand and an overhand grip on the opposite hand, grasp a broomstick or cane so that your hands are a little  more than shoulder width apart.  Keeping your right / left elbow straight and your shoulder muscles relaxed, push the stick with your opposite hand, to raise your right / left arm out to the side of your body and then overhead. Raise your arm until you feel a stretch in your right / left shoulder, but before you have increased shoulder pain.  Try to avoid shrugging your right / left shoulder  as your arm rises, by keeping your shoulder blade tucked down and toward your mid-back spine. Hold for __________ seconds.  Slowly return to the starting position. Repeat __________ times. Complete this exercise __________ times per day. ROM  Flexion, Active-Assisted  Lie on your back. You may bend your knees for comfort.  Grasp a broomstick or cane so your hands are about shoulder width apart. Your right / left hand should grip the end of the stick, so that your hand is positioned "thumbs-up," as if you were about to shake hands.  Using your healthy arm to lead, raise your right / left arm overhead, until you feel a gentle stretch in your shoulder. Hold for __________ seconds.  Use the stick to assist in returning your right / left arm to its starting position. Repeat __________ times. Complete this exercise __________ times per day.  ROM - Internal Rotation, Supine   Lie on your back on a firm surface. Place your right / left elbow about 60 degrees away from your side. Elevate your elbow with a folded towel, so that the elbow and shoulder are the same height.  Using a broomstick or cane and your strong arm, pull your right / left hand toward your body until you feel a gentle stretch, but no increase in your shoulder pain. Keep your shoulder and elbow in place throughout the exercise.  Hold for __________ seconds. Slowly return to the starting position. Repeat __________ times. Complete this exercise __________ times per day. STRETCH - Internal Rotation  Place your right / left hand behind your back, palm up.  Throw a towel or belt over your opposite shoulder. Grasp the towel with your right / left hand.  While keeping an upright posture, gently pull up on the towel, until you feel a stretch in the front of your right / left shoulder.  Avoid shrugging your right / left shoulder as your arm rises, by keeping your shoulder blade tucked down and toward your mid-back spine.  Hold for  __________ seconds. Release the stretch, by lowering your healthy hand. Repeat __________ times. Complete this exercise __________ times per day. ROM - Internal Rotation   Using an underhand grip, grasp a stick behind your back with both hands.  While standing upright with good posture, slide the stick up your back until you feel a mild stretch in the front of your shoulder.  Hold for __________ seconds. Slowly return to your starting position. Repeat __________ times. Complete this exercise __________ times per day.  STRETCH  Posterior Shoulder Capsule   Stand or sit with good posture. Grasp your right / left elbow and draw it across your chest, keeping it at the same height as your shoulder.  Pull your elbow, so your upper arm comes in closer to your chest. Pull until you feel a gentle stretch in the back of your shoulder.  Hold for __________ seconds. Repeat __________ times. Complete this exercise __________ times per day. STRENGTHENING EXERCISES - Impingement Syndrome (Rotator Cuff Tendinitis, Bursitis) These exercises may help you when beginning to rehabilitate  your injury. They may resolve your symptoms with or without further involvement from your physician, physical therapist or athletic trainer. While completing these exercises, remember:  Muscles can gain both the endurance and the strength needed for everyday activities through controlled exercises.  Complete these exercises as instructed by your physician, physical therapist or athletic trainer. Increase the resistance and repetitions only as guided.  You may experience muscle soreness or fatigue, but the pain or discomfort you are trying to eliminate should never worsen during these exercises. If this pain does get worse, stop and make sure you are following the directions exactly. If the pain is still present after adjustments, discontinue the exercise until you can discuss the trouble with your clinician.  During your  recovery, avoid activity or exercises which involve actions that place your injured hand or elbow above your head or behind your back or head. These positions stress the tissues which you are trying to heal. STRENGTH - Scapular Depression and Adduction   With good posture, sit on a firm chair. Support your arms in front of you, with pillows, arm rests, or on a table top. Have your elbows in line with the sides of your body.  Gently draw your shoulder blades down and toward your mid-back spine. Gradually increase the tension, without tensing the muscles along the top of your shoulders and the back of your neck.  Hold for __________ seconds. Slowly release the tension and relax your muscles completely before starting the next repetition.  After you have practiced this exercise, remove the arm support and complete the exercise in standing as well as sitting position. Repeat __________ times. Complete this exercise __________ times per day.  STRENGTH - Shoulder Abductors, Isometric  With good posture, stand or sit about 4-6 inches from a wall, with your right / left side facing the wall.  Bend your right / left elbow. Gently press your right / left elbow into the wall. Increase the pressure gradually, until you are pressing as hard as you can, without shrugging your shoulder or increasing any shoulder discomfort.  Hold for __________ seconds.  Release the tension slowly. Relax your shoulder muscles completely before you begin the next repetition. Repeat __________ times. Complete this exercise __________ times per day.  STRENGTH - External Rotators, Isometric  Keep your right / left elbow at your side and bend it 90 degrees.  Step into a door frame so that the outside of your right / left wrist can press against the door frame without your upper arm leaving your side.  Gently press your right / left wrist into the door frame, as if you were trying to swing the back of your hand away from your  stomach. Gradually increase the tension, until you are pressing as hard as you can, without shrugging your shoulder or increasing any shoulder discomfort.  Hold for __________ seconds.  Release the tension slowly. Relax your shoulder muscles completely before you begin the next repetition. Repeat __________ times. Complete this exercise __________ times per day.  STRENGTH - Supraspinatus   Stand or sit with good posture. Grasp a __________ weight, or an exercise band or tubing, so that your hand is "thumbs-up," like you are shaking hands.  Slowly lift your right / left arm in a "V" away from your thigh, diagonally into the space between your side and straight ahead. Lift your hand to shoulder height or as far as you can, without increasing any shoulder pain. At first, many people do not  lift their hands above shoulder height.  Avoid shrugging your right / left shoulder as your arm rises, by keeping your shoulder blade tucked down and toward your mid-back spine.  Hold for __________ seconds. Control the descent of your hand, as you slowly return to your starting position. Repeat __________ times. Complete this exercise __________ times per day.  STRENGTH - External Rotators  Secure a rubber exercise band or tubing to a fixed object (table, pole) so that it is at the same height as your right / left elbow when you are standing or sitting on a firm surface.  Stand or sit so that the secured exercise band is at your uninjured side.  Bend your right / left elbow 90 degrees. Place a folded towel or small pillow under your right / left arm, so that your elbow is a few inches away from your side.  Keeping the tension on the exercise band, pull it away from your body, as if pivoting on your elbow. Be sure to keep your body steady, so that the movement is coming only from your rotating shoulder.  Hold for __________ seconds. Release the tension in a controlled manner, as you return to the starting  position. Repeat __________ times. Complete this exercise __________ times per day.  STRENGTH - Internal Rotators   Secure a rubber exercise band or tubing to a fixed object (table, pole) so that it is at the same height as your right / left elbow when you are standing or sitting on a firm surface.  Stand or sit so that the secured exercise band is at your right / left side.  Bend your elbow 90 degrees. Place a folded towel or small pillow under your right / left arm so that your elbow is a few inches away from your side.  Keeping the tension on the exercise band, pull it across your body, toward your stomach. Be sure to keep your body steady, so that the movement is coming only from your rotating shoulder.  Hold for __________ seconds. Release the tension in a controlled manner, as you return to the starting position. Repeat __________ times. Complete this exercise __________ times per day.  STRENGTH - Scapular Protractors, Standing   Stand arms length away from a wall. Place your hands on the wall, keeping your elbows straight.  Begin by dropping your shoulder blades down and toward your mid-back spine.  To strengthen your protractors, keep your shoulder blades down, but slide them forward on your rib cage. It will feel as if you are lifting the back of your rib cage away from the wall. This is a subtle motion and can be challenging to complete. Ask your caregiver for further instruction, if you are not sure you are doing the exercise correctly.  Hold for __________ seconds. Slowly return to the starting position, resting the muscles completely before starting the next repetition. Repeat __________ times. Complete this exercise __________ times per day. STRENGTH - Scapular Protractors, Supine  Lie on your back on a firm surface. Extend your right / left arm straight into the air while holding a __________ weight in your hand.  Keeping your head and back in place, lift your shoulder off  the floor.  Hold for __________ seconds. Slowly return to the starting position, and allow your muscles to relax completely before starting the next repetition. Repeat __________ times. Complete this exercise __________ times per day. STRENGTH - Scapular Protractors, Quadruped  Get onto your hands and knees, with your  shoulders directly over your hands (or as close as you can be, comfortably).  Keeping your elbows locked, lift the back of your rib cage up into your shoulder blades, so your mid-back rounds out. Keep your neck muscles relaxed.  Hold this position for __________ seconds. Slowly return to the starting position and allow your muscles to relax completely before starting the next repetition. Repeat __________ times. Complete this exercise __________ times per day.  STRENGTH - Scapular Retractors  Secure a rubber exercise band or tubing to a fixed object (table, pole), so that it is at the height of your shoulders when you are either standing, or sitting on a firm armless chair.  With a palm down grip, grasp an end of the band in each hand. Straighten your elbows and lift your hands straight in front of you, at shoulder height. Step back, away from the secured end of the band, until it becomes tense.  Squeezing your shoulder blades together, draw your elbows back toward your sides, as you bend them. Keep your upper arms lifted away from your body throughout the exercise.  Hold for __________ seconds. Slowly ease the tension on the band, as you reverse the directions and return to the starting position. Repeat __________ times. Complete this exercise __________ times per day. STRENGTH - Shoulder Extensors   Secure a rubber exercise band or tubing to a fixed object (table, pole) so that it is at the height of your shoulders when you are either standing, or sitting on a firm armless chair.  With a thumbs-up grip, grasp an end of the band in each hand. Straighten your elbows and lift  your hands straight in front of you, at shoulder height. Step back, away from the secured end of the band, until it becomes tense.  Squeezing your shoulder blades together, pull your hands down to the sides of your thighs. Do not allow your hands to go behind you.  Hold for __________ seconds. Slowly ease the tension on the band, as you reverse the directions and return to the starting position. Repeat __________ times. Complete this exercise __________ times per day.  STRENGTH - Scapular Retractors and External Rotators   Secure a rubber exercise band or tubing to a fixed object (table, pole) so that it is at the height as your shoulders, when you are either standing, or sitting on a firm armless chair.  With a palm down grip, grasp an end of the band in each hand. Bend your elbows 90 degrees and lift your elbows to shoulder height, at your sides. Step back, away from the secured end of the band, until it becomes tense.  Squeezing your shoulder blades together, rotate your shoulders so that your upper arms and elbows remain stationary, but your fists travel upward to head height.  Hold for __________ seconds. Slowly ease the tension on the band, as you reverse the directions and return to the starting position. Repeat __________ times. Complete this exercise __________ times per day.  STRENGTH - Scapular Retractors and External Rotators, Rowing   Secure a rubber exercise band or tubing to a fixed object (table, pole) so that it is at the height of your shoulders, when you are either standing, or sitting on a firm armless chair.  With a palm down grip, grasp an end of the band in each hand. Straighten your elbows and lift your hands straight in front of you, at shoulder height. Step back, away from the secured end of the band, until  it becomes tense.  Step 1: Squeeze your shoulder blades together. Bending your elbows, draw your hands to your chest, as if you are rowing a boat. At the end of  this motion, your hands and elbow should be at shoulder height and your elbows should be out to your sides.  Step 2: Rotate your shoulders, to raise your hands above your head. Your forearms should be vertical and your upper arms should be horizontal.  Hold for __________ seconds. Slowly ease the tension on the band, as you reverse the directions and return to the starting position. Repeat __________ times. Complete this exercise __________ times per day.  STRENGTH  Scapular Depressors  Find a sturdy chair without wheels, such as a dining room chair.  Keeping your feet on the floor, and your hands on the chair arms, lift your bottom up from the seat, and lock your elbows.  Keeping your elbows straight, allow gravity to pull your body weight down. Your shoulders will rise toward your ears.  Raise your body against gravity by drawing your shoulder blades down your back, shortening the distance between your shoulders and ears. Although your feet should always maintain contact with the floor, your feet should progressively support less body weight, as you get stronger.  Hold for __________ seconds. In a controlled and slow manner, lower your body weight to begin the next repetition. Repeat __________ times. Complete this exercise __________ times per day.  Document Released: 01/17/2005 Document Revised: 04/11/2011 Document Reviewed: 05/01/2008 [facility] Patient Information 2013 [place], [place].

## 2012-05-18 NOTE — Progress Notes (Signed)
  Subjective:    Patient ID: Cheryl Taylor, female    DOB: 03/05/1965, 47 y.o.   MRN: [id number]  HPI See CC above  Re: r arm pain: ongoing pain symptoms laterally x6 weeks Exacerbation 2 weeks ago during travel to [place] - seen by resort medical provider and given prescription for meloxicam and muscle relaxer which provided good relief Denies weakness, numbness No precipitating overuse or injury recalled  Also concerned about small cyst at base of left thumb on palm Present for months with gradual increase in size Denies pain, redness or irritation Denies known trauma or foreign body exposure potential   Past Medical History  Diagnosis Date  . Depression   . Anxiety   . Vitamin D deficiency   . HTN (hypertension)   . Alcohol abuse, in remission   . Raynaud's disease   . Hyperlipidemia     Review of Systems  Constitutional: Negative for fever and fatigue.  HENT: Negative for neck pain and neck stiffness.   Musculoskeletal: Negative for back pain, joint swelling and arthralgias.  Neurological: Negative for weakness and numbness.       Objective:   Physical Exam BP 130/88  Pulse 80  Temp(Src) 98.4 F (36.9 C) (Oral)  Resp 14  Wt 180 lb 2 oz (81.704 kg)  BMI 30.9 kg/m2  SpO2 97%  LMP 03/27/2012 Wt Readings from Last 3 Encounters:  05/18/12 180 lb 2 oz (81.704 kg)  12/15/11 172 lb 12.8 oz (78.382 kg)  07/14/11 176 lb (79.833 kg)   Constitutional: She appears well-developed and well-nourished. No distress.  Musculoskeletal: R Shoulder: Full range of motion. Neurovascularly intact distally. Good strength with stress of rotator cuff but causes pain. Positive impingement signs. Skin: 3mm cyst on L palm over thenar eminence - nontender, noninflamed. No purulence. No clear evidence of foreign body. Remaining skin is warm and dry. No rash noted. No erythema.  Psychiatric: She has a normal mood and affect. Her behavior is normal. Judgment and thought content normal.   Lab  Results  Component Value Date   WBC 7.7 12/15/2011   HGB 13.1 12/15/2011   HCT 40.6 12/15/2011   PLT 246.0 12/15/2011   GLUCOSE 80 12/15/2011   CHOL 183 12/15/2011   TRIG 96.0 12/15/2011   HDL 46.20 12/15/2011   LDLCALC 118* 12/15/2011   ALT 26 12/15/2011   AST 26 12/15/2011   NA 139 12/15/2011   K 3.4* 12/15/2011   CL 106 12/15/2011   CREATININE 0.8 12/15/2011   BUN 11 12/15/2011   CO2 25 12/15/2011   TSH 0.92 12/15/2011      Assessment & Plan:   R shoulder pain - history and exam consistent with impingement syndrome Continue anti-inflammatory, meloxicam 15 mg daily x2 weeks, then as needed Okay for muscle relaxer as needed, renew soma 200 prn Home physical therapy exercises provided Patient to call symptoms worse or unimproved continue steroid injection and/or sports medicine/for the referral  Small Inclusion cyst over left thenar eminence of palm. Suspect foreign body. No evidence of infection or inflammation. Patient declines need for x-ray or ultrasound at this time. She will monitor and let us know if increasing redness, pain, size or other problems to consider further evaluation and treatment as needed

## 2012-05-21 ENCOUNTER — Other Ambulatory Visit: Payer: Self-pay

## 2012-05-21 MED ORDER — MELOXICAM 15 MG PO TABS: 15.0000 mg | ORAL_TABLET | Freq: Every day | ORAL | Status: DC

## 2012-06-27 ENCOUNTER — Other Ambulatory Visit: Payer: Self-pay | Admitting: Internal Medicine

## 2012-07-03 ENCOUNTER — Other Ambulatory Visit: Payer: Self-pay | Admitting: Family Medicine

## 2012-10-05 ENCOUNTER — Other Ambulatory Visit: Payer: Self-pay | Admitting: Family Medicine

## 2012-11-07 ENCOUNTER — Ambulatory Visit (HOSPITAL_BASED_OUTPATIENT_CLINIC_OR_DEPARTMENT_OTHER)
Admission: RE | Admit: 2012-11-07 | Discharge: 2012-11-07 | Disposition: A | Discharge status: Home / Self Care | Payer: 59 | Source: Ambulatory Visit | Attending: Physician Assistant | Admitting: Physician Assistant

## 2012-11-07 ENCOUNTER — Telehealth: Payer: Self-pay | Admitting: Physician Assistant

## 2012-11-07 ENCOUNTER — Ambulatory Visit (INDEPENDENT_AMBULATORY_CARE_PROVIDER_SITE_OTHER): Payer: 59 | Admitting: Physician Assistant

## 2012-11-07 ENCOUNTER — Encounter: Payer: Self-pay | Admitting: Physician Assistant

## 2012-11-07 ENCOUNTER — Encounter (HOSPITAL_BASED_OUTPATIENT_CLINIC_OR_DEPARTMENT_OTHER): Payer: Self-pay

## 2012-11-07 VITALS — BP 142/94 | HR 78 | Temp 98.9°F | Resp 16 | Ht 64.0 in | Wt 176.5 lb

## 2012-11-07 DIAGNOSIS — R8290 Unspecified abnormal findings in urine: Secondary | ICD-10-CM

## 2012-11-07 DIAGNOSIS — N83209 Unspecified ovarian cyst, unspecified side: Secondary | ICD-10-CM

## 2012-11-07 DIAGNOSIS — R82998 Other abnormal findings in urine: Secondary | ICD-10-CM

## 2012-11-07 DIAGNOSIS — R319 Hematuria, unspecified: Secondary | ICD-10-CM

## 2012-11-07 DIAGNOSIS — R935 Abnormal findings on diagnostic imaging of other abdominal regions, including retroperitoneum: Secondary | ICD-10-CM | POA: Insufficient documentation

## 2012-11-07 DIAGNOSIS — R1032 Left lower quadrant pain: Secondary | ICD-10-CM

## 2012-11-07 DIAGNOSIS — D1803 Hemangioma of intra-abdominal structures: Secondary | ICD-10-CM

## 2012-11-07 DIAGNOSIS — N83202 Unspecified ovarian cyst, left side: Secondary | ICD-10-CM | POA: Insufficient documentation

## 2012-11-07 LAB — COMPREHENSIVE METABOLIC PANEL
ALT: 31 U/L (ref 0–35)
AST: 28 U/L (ref 0–37)
Albumin: 4.6 g/dL (ref 3.5–5.2)
Alkaline Phosphatase: 60 U/L (ref 39–117)
BUN: 8 mg/dL (ref 6–23)
CO2: 28 mEq/L (ref 19–32)
Calcium: 10 mg/dL (ref 8.4–10.5)
Chloride: 100 mEq/L (ref 96–112)
Creat: 0.7 mg/dL (ref 0.50–1.10)
Glucose, Bld: 80 mg/dL (ref 70–99)
Potassium: 3.9 mEq/L (ref 3.5–5.3)
Sodium: 138 mEq/L (ref 135–145)
Total Bilirubin: 0.5 mg/dL (ref 0.3–1.2)
Total Protein: 7.3 g/dL (ref 6.0–8.3)

## 2012-11-07 LAB — CBC WITH DIFFERENTIAL/PLATELET
Basophils Absolute: 0.1 10*3/uL (ref 0.0–0.1)
Basophils Relative: 1 % (ref 0–1)
Eosinophils Absolute: 0.7 10*3/uL (ref 0.0–0.7)
Eosinophils Relative: 7 % — ABNORMAL HIGH (ref 0–5)
HCT: 37.6 % (ref 36.0–46.0)
Hemoglobin: 12.8 g/dL (ref 12.0–15.0)
Lymphocytes Relative: 21 % (ref 12–46)
Lymphs Abs: 1.9 10*3/uL (ref 0.7–4.0)
MCH: 28.3 pg (ref 26.0–34.0)
MCHC: 34 g/dL (ref 30.0–36.0)
MCV: 83.2 fL (ref 78.0–100.0)
Monocytes Absolute: 0.7 10*3/uL (ref 0.1–1.0)
Monocytes Relative: 8 % (ref 3–12)
Neutro Abs: 5.8 10*3/uL (ref 1.7–7.7)
Neutrophils Relative %: 63 % (ref 43–77)
Platelets: 309 10*3/uL (ref 150–400)
RBC: 4.52 MIL/uL (ref 3.87–5.11)
RDW: 14.1 % (ref 11.5–15.5)
WBC: 9.2 10*3/uL (ref 4.0–10.5)

## 2012-11-07 LAB — POCT URINALYSIS DIPSTICK
Bilirubin, UA: NEGATIVE
Glucose, UA: NEGATIVE
Ketones, UA: NEGATIVE
Leukocytes, UA: NEGATIVE
Nitrite, UA: NEGATIVE
Spec Grav, UA: 1.01
Urobilinogen, UA: 0.2
pH, UA: 7.5

## 2012-11-07 LAB — POCT PREGNANCY, URINE

## 2012-11-07 LAB — SEDIMENTATION RATE: Sed Rate: 4 mm/hr (ref 0–22)

## 2012-11-07 MED ORDER — IOHEXOL 300 MG/ML  SOLN
100.0000 mL | Freq: Once | INTRAMUSCULAR | Status: AC | PRN
  Administered 2012-11-07: 100 mL via INTRAVENOUS

## 2012-11-07 NOTE — Patient Instructions (Signed)
Please obtain labs.  I will call you with the results.  Please obtain CT scan.  I will call you with your results, and we will treat you accordingly.

## 2012-11-07 NOTE — Assessment & Plan Note (Signed)
Urine dipstick positive for hemoglobin. Negative for nitrites or leukocyte esterase. Will send for microscopy and culture. Urine pregnancy is negative.  Will obtain CBC and CMP.  CT of abdomen and pelvis with contrast.  Will discuss results with patient and [facility] treatment accordingly.

## 2012-11-07 NOTE — Assessment & Plan Note (Signed)
Incidental finding on CT. 3.9 x 2 cm.  Asymptomatic. Follow-up visit and repeat imaging in 6 months.

## 2012-11-07 NOTE — Telephone Encounter (Signed)
Spoke with Patient concerning CT results.  Will make referral to Gynecology for symptomatic ovarian cyst.  Also discussed incidental finding of a small hepatic hemangioma on R lobe of liver.  Recommended follow-up visit with repeat imaging in 6 months.

## 2012-11-07 NOTE — Progress Notes (Signed)
Patient ID: Cheryl Taylor, female   DOB: 1966/01/27, 47 y.o.   MRN: [id number]  Patient is a 46 year old Caucasian female who presents to clinic today complaining of lower abdominal pain.  Describes pain as sharp and stabbing, located around her C-section scar. States that pain is episodic, lasting 1-2 days before subsiding.  Pain is 8/10 on the pain scale at its worst.  States that pain seems to be brought on or exacerbated by eating.  Patient endorses that this pain has been present over the past 4 months, and has not changed in nature since onset.  Patient denies nausea, vomiting, diarrhea, constipation. Last bowel movement was this morning. Denies hematochezia or melena.  Last menstrual period finished one week ago. Patient denies any abnormalities with menstrual cycle.  Patient denies history of diverticulosis or gynecological pathology. Patient endorses having one prior abdominal surgery, C-section, 11 years ago. Denies complication of surgery.  Patient is currently sexually active with one female partner, her husband, denies use of protection or contraceptive.  Denies current concerns of pregnancy or STD. Denies vaginal pain, discharge or dyspareunia.    Past Medical History  Diagnosis Date  . Depression   . Anxiety   . Vitamin D deficiency   . HTN (hypertension)   . Alcohol abuse, in remission   . Raynaud's disease   . Hyperlipidemia     Current Outpatient Prescriptions on File Prior to Visit  Medication Sig Dispense Refill  . valACYclovir (VALTREX) 1000 MG tablet TAKE 1 TABLET DAILY  90 tablet  0   No current facility-administered medications on file prior to visit.    No Known Allergies  Family History  Problem Relation Age of Onset  . Kidney failure Father 44    deceased  . Kidney disease Father   . Obesity Father   . Seizures Father   . Heart disease Father 37  . Schizophrenia Brother     52yo  . Alcohol abuse Brother   . Other Brother     chronic pain, 2 yo  . Alcohol  abuse Sister     43yo  . Obesity Paternal Grandfather     History   Social History  . Marital Status: Married    Spouse Name: N/A    Number of Children: N/A  . Years of Education: N/A   Social History Main Topics  . Smoking status: Never Smoker   . Smokeless tobacco: Never Used  . Alcohol Use: 30.0 oz/week    50 Glasses of wine per week     Comment: 2-3 bottles of wine a night  . Drug Use: No     Comment: Ativan 4-5mg  daily  . Sexual Activity: Yes    Partners: Male    Birth Control/ Protection: Pill   Other Topics Concern  . None   Social History Narrative  . None   ROS See history of present illness. All other review of systems are negative.  Filed Vitals:   11/07/12 0918  BP: 142/94  Pulse: 78  Temp: 98.9 F (37.2 C)  Resp: 16    Physical Exam  Vitals reviewed. Constitutional: She is oriented to person, place, and time and well-developed, well-nourished, and in no distress.  HENT:  Head: Normocephalic and atraumatic.  Mouth/Throat: Oropharynx is clear and moist.  Cardiovascular: Normal rate, regular rhythm, normal heart sounds and intact distal pulses.   Pulmonary/Chest: Effort normal and breath sounds normal.  Abdominal: Soft. Bowel sounds are normal. She exhibits no distension and no mass.  There is no rebound and no guarding.  Positive left lower quadrant tenderness to light and deep palpation, without guarding, mass or rebound tenderness.  No tenderness noted elsewhere.  Genitourinary: Vagina normal, uterus normal, cervix normal, right adnexa normal and left adnexa normal. No vaginal discharge found.  No cervical motion tenderness or adnexal tenderness noted on examination.  Lymphadenopathy:    She has no cervical adenopathy.  Neurological: She is alert and oriented to person, place, and time.  Skin: Skin is warm and dry. No rash noted.   Recent Results (from the past 2160 hour(s))  POCT URINALYSIS DIPSTICK     Status: None   Collection Time     11/07/12 10:05 AM      Result Value Range   Color, UA gold     Comment: dark   Clarity, UA murky     Glucose, UA neg     Bilirubin, UA neg     Ketones, UA neg     Spec Grav, UA 1.010     Blood, UA moderate     pH, UA 7.5     Protein, UA trace     Urobilinogen, UA 0.2     Nitrite, UA neg     Leukocytes, UA Negative    POCT PREGNANCY, URINE     Status: None   Collection Time    11/07/12 10:06 AM      Result Value Range   NEG CONTROL       Comment: Negative  COMPREHENSIVE METABOLIC PANEL     Status: None   Collection Time    11/07/12 10:09 AM      Result Value Range   Sodium 138  135 - 145 mEq/L   Potassium 3.9  3.5 - 5.3 mEq/L   Chloride 100  96 - 112 mEq/L   CO2 28  19 - 32 mEq/L   Glucose, Bld 80  70 - 99 mg/dL   BUN 8  6 - 23 mg/dL   Creat 4.09  8.11 - 9.14 mg/dL   Total Bilirubin 0.5  0.3 - 1.2 mg/dL   Alkaline Phosphatase 60  39 - 117 U/L   AST 28  0 - 37 U/L   ALT 31  0 - 35 U/L   Total Protein 7.3  6.0 - 8.3 g/dL   Albumin 4.6  3.5 - 5.2 g/dL   Calcium 78.2  8.4 - 95.6 mg/dL  CBC WITH DIFFERENTIAL     Status: None   Collection Time    11/07/12 10:09 AM      Result Value Range   WBC    4.0 - 10.5 K/uL   RBC    3.87 - 5.11 MIL/uL   Hemoglobin    12.0 - 15.0 g/dL   HCT    21.3 - 08.6 %   MCV    78.0 - 100.0 fL   MCH    26.0 - 34.0 pg   MCHC    30.0 - 36.0 g/dL   RDW    57.8 - 46.9 %   Platelets    150 - 400 K/uL   Neutrophils Relative %    43 - 77 %   Neutro Abs    1.7 - 7.7 K/uL   Lymphocytes Relative    12 - 46 %   Lymphs Abs    0.7 - 4.0 K/uL   Monocytes Relative    3 - 12 %   Monocytes Absolute    0.1 - 1.0  K/uL   Eosinophils Relative    0 - 5 %   Eosinophils Absolute    0.0 - 0.7 K/uL   Basophils Relative    0 - 1 %   Basophils Absolute    0.0 - 0.1 K/uL   Smear Review      SEDIMENTATION RATE     Status: None   Collection Time    11/07/12 10:09 AM      Result Value Range   Sed Rate    0 - 22 mm/hr    Assessment/Plan: LLQ abdominal  pain Urine dipstick positive for hemoglobin. Negative for nitrites or leukocyte esterase. Will send for microscopy and culture. Urine pregnancy is negative.  Will obtain CBC and CMP.  CT of abdomen and pelvis with contrast.  Will discuss results with patient and [facility] treatment accordingly.

## 2012-11-08 ENCOUNTER — Telehealth: Payer: Self-pay | Admitting: *Deleted

## 2012-11-08 LAB — CULTURE, URINE COMPREHENSIVE
Colony Count: NO GROWTH
Organism ID, Bacteria: NO GROWTH

## 2012-11-08 LAB — URINALYSIS, ROUTINE W REFLEX MICROSCOPIC
Bilirubin Urine: NEGATIVE
Glucose, UA: NEGATIVE mg/dL
Ketones, ur: NEGATIVE mg/dL
Leukocytes, UA: NEGATIVE
Nitrite: NEGATIVE
Protein, ur: NEGATIVE mg/dL
Specific Gravity, Urine: 1.012 (ref 1.005–1.030)
Urobilinogen, UA: 0.2 mg/dL (ref 0.0–1.0)
pH: 7.5 (ref 5.0–8.0)

## 2012-11-08 LAB — URINALYSIS, MICROSCOPIC ONLY
Bacteria, UA: NONE SEEN
Casts: NONE SEEN
Crystals: NONE SEEN

## 2012-11-08 NOTE — Telephone Encounter (Signed)
[facility] Triage Call Report Triage Record Num: [id number] Operator: Donnella Sham Patient Name: Cheryl Taylor Call Date & Time: 11/07/2012 9:56:27PM Patient Phone: [phone] PCP: Patient Gender: Female PCP Fax : Patient DOB: 09/13/1965 Practice Name:  - [place] Reason for Call: Caller: [place]; PCP: Piedad Climes"; CB#: [phone]; Call regarding: Labs; had CBC, sed rate and CMP drawn STAT 11/07/12 at 1009; no criticals; will fax results to the office Protocol(s) Used: Office Note Recommended Outcome per Protocol: Information Noted and Sent to Office Reason for Outcome: Caller information to office Care Advice: ~ 11/07/2012 10:00:33PM Page 1 of 1 [email or web address]

## 2012-12-10 ENCOUNTER — Encounter: Payer: Self-pay | Admitting: Physician Assistant

## 2012-12-10 ENCOUNTER — Ambulatory Visit (INDEPENDENT_AMBULATORY_CARE_PROVIDER_SITE_OTHER): Payer: 59 | Admitting: Physician Assistant

## 2012-12-10 VITALS — BP 132/86 | HR 88 | Temp 98.4°F | Ht 64.0 in | Wt 180.5 lb

## 2012-12-10 DIAGNOSIS — L255 Unspecified contact dermatitis due to plants, except food: Secondary | ICD-10-CM | POA: Insufficient documentation

## 2012-12-10 MED ORDER — PREDNISONE 10 MG PO TABS: ORAL_TABLET | ORAL | Status: DC

## 2012-12-10 NOTE — Progress Notes (Signed)
Pre visit review using our clinic review tool, if applicable. No additional management support is needed unless otherwise documented below in the visit note. 

## 2012-12-10 NOTE — Assessment & Plan Note (Signed)
Rx Prednisone taper.  Continue topical hydrocortisone.  Antihistamine BID and cold compresses for itch.  Discussed appropriate hygiene measures to help cut down on the spread of rash.  Follow-up if symptoms not improving.

## 2012-12-10 NOTE — Progress Notes (Signed)
Patient ID: Cheryl Taylor, female   DOB: [birth year], 47 y.o.   MRN: [id number]  Patient presents to clinic today c/o of pruritic rash on left anterior forearm and chest x 1 week.  Patient does not remember coming into contact with poison ivy, but notes her dogs run through the woods a lot and she noticed the rash a few hours after rubbing her dog.  Patient denies change in hygiene products or laundry detergents.  No new clothes.  No other affected family member.  Patient has tried topical hydrocortisone cream but the rash is still spreading.  Patient endorses rash is pruritic, denies vesicles or drainage/weeping from rash.  Patient has history of poison ivy/oak rash and states this seems similar.  Patient denies chest tightness, wheezing or shortness of breath.  Denies facial swelling.   Past Medical History  Diagnosis Date  . Depression   . Anxiety   . Vitamin D deficiency   . HTN (hypertension)   . Alcohol abuse, in remission   . Raynaud's disease   . Hyperlipidemia     Current Outpatient Prescriptions on File Prior to Visit  Medication Sig Dispense Refill  . valACYclovir (VALTREX) 1000 MG tablet TAKE 1 TABLET DAILY  90 tablet  0   No current facility-administered medications on file prior to visit.    No Known Allergies  Family History  Problem Relation Age of Onset  . Kidney failure Father 80    deceased  . Kidney disease Father   . Obesity Father   . Seizures Father   . Heart disease Father 45  . Schizophrenia Brother     52yo  . Alcohol abuse Brother   . Other Brother     chronic pain, 59 yo  . Alcohol abuse Sister     54yo  . Obesity Paternal Grandfather     History   Social History  . Marital Status: Married    Spouse Name: N/A    Number of Children: N/A  . Years of Education: N/A   Social History Main Topics  . Smoking status: Never Smoker   . Smokeless tobacco: Never Used  . Alcohol Use: 30.0 oz/week    50 Glasses of wine per week     Comment: 2-3  bottles of wine a night  . Drug Use: No     Comment: Ativan 4-5mg  daily  . Sexual Activity: Yes    Partners: Male    Birth Control/ Protection: Pill   Other Topics Concern  . None   Social History Narrative  . None   ROS See HPI.  All other ROS are negative.   Filed Vitals:   12/10/12 0910  BP: 132/86  Pulse: 88  Temp: 98.4 F (36.9 C)   Physical Exam  Vitals reviewed. Constitutional: She is oriented to person, place, and time and well-developed, well-nourished, and in no distress.  HENT:  Head: Normocephalic and atraumatic.  Eyes: Conjunctivae are normal.  Neck: Neck supple.  Cardiovascular: Normal rate, regular rhythm, normal heart sounds and intact distal pulses.   Pulmonary/Chest: Effort normal and breath sounds normal. No respiratory distress. She has no wheezes. She has no rales. She exhibits no tenderness.  Lymphadenopathy:    She has no cervical adenopathy.  Neurological: She is alert and oriented to person, place, and time. No cranial nerve deficit.  Skin: Skin is warm and dry.  Presence of an erythematous macular rash of left anterior forearm spreading up the LUE.  Similar rash noted on chest.  No rash noted elsewhere.  Rash is linear with presence of mild excoriation.  No evidence of superimposed infection on exam.    Recent Results (from the past 2160 hour(s))  CULTURE, URINE COMPREHENSIVE     Status: None   Collection Time    11/07/12 10:04 AM      Result Value Range   Colony Count NO GROWTH     Organism ID, Bacteria NO GROWTH    URINALYSIS, ROUTINE W REFLEX MICROSCOPIC     Status: Abnormal   Collection Time    11/07/12 10:04 AM      Result Value Range   Color, Urine YELLOW  YELLOW   APPearance CLEAR  CLEAR   Specific Gravity, Urine 1.012  1.005 - 1.030   pH 7.5  5.0 - 8.0   Glucose, UA NEG  NEG mg/dL   Bilirubin Urine NEG  NEG   Ketones, ur NEG  NEG mg/dL   Hgb urine dipstick SMALL (*) NEG   Protein, ur NEG  NEG mg/dL   Urobilinogen, UA 0.2   0.0 - 1.0 mg/dL   Nitrite NEG  NEG   Leukocytes, UA NEG  NEG  URINALYSIS, MICROSCOPIC ONLY     Status: None   Collection Time    11/07/12 10:04 AM      Result Value Range   Squamous Epithelial / LPF FEW  RARE   Crystals NONE SEEN  NONE SEEN   Casts NONE SEEN  NONE SEEN   WBC, UA 0-2  <3 WBC/hpf   RBC / HPF 0-2  <3 RBC/hpf   Bacteria, UA NONE SEEN  RARE  POCT URINALYSIS DIPSTICK     Status: None   Collection Time    11/07/12 10:05 AM      Result Value Range   Color, UA gold     Comment: dark   Clarity, UA murky     Glucose, UA neg     Bilirubin, UA neg     Ketones, UA neg     Spec Grav, UA 1.010     Blood, UA moderate     pH, UA 7.5     Protein, UA trace     Urobilinogen, UA 0.2     Nitrite, UA neg     Leukocytes, UA Negative    POCT PREGNANCY, URINE     Status: None   Collection Time    11/07/12 10:06 AM      Result Value Range   NEG CONTROL       Comment: Negative  COMPREHENSIVE METABOLIC PANEL     Status: None   Collection Time    11/07/12 10:09 AM      Result Value Range   Sodium 138  135 - 145 mEq/L   Potassium 3.9  3.5 - 5.3 mEq/L   Chloride 100  96 - 112 mEq/L   CO2 28  19 - 32 mEq/L   Glucose, Bld 80  70 - 99 mg/dL   BUN 8  6 - 23 mg/dL   Creat 2.13  0.86 - 5.78 mg/dL   Total Bilirubin 0.5  0.3 - 1.2 mg/dL   Alkaline Phosphatase 60  39 - 117 U/L   AST 28  0 - 37 U/L   ALT 31  0 - 35 U/L   Total Protein 7.3  6.0 - 8.3 g/dL   Albumin 4.6  3.5 - 5.2 g/dL   Calcium 46.9  8.4 - 62.9 mg/dL  CBC WITH DIFFERENTIAL  Status: Abnormal   Collection Time    11/07/12 10:09 AM      Result Value Range   WBC 9.2  4.0 - 10.5 K/uL   RBC 4.52  3.87 - 5.11 MIL/uL   Hemoglobin 12.8  12.0 - 15.0 g/dL   HCT 14.7  82.9 - 56.2 %   MCV 83.2  78.0 - 100.0 fL   MCH 28.3  26.0 - 34.0 pg   MCHC 34.0  30.0 - 36.0 g/dL   RDW 13.0  86.5 - 78.4 %   Platelets 309  150 - 400 K/uL   Neutrophils Relative % 63  43 - 77 %   Neutro Abs 5.8  1.7 - 7.7 K/uL   Lymphocytes Relative  21  12 - 46 %   Lymphs Abs 1.9  0.7 - 4.0 K/uL   Monocytes Relative 8  3 - 12 %   Monocytes Absolute 0.7  0.1 - 1.0 K/uL   Eosinophils Relative 7 (*) 0 - 5 %   Eosinophils Absolute 0.7  0.0 - 0.7 K/uL   Basophils Relative 1  0 - 1 %   Basophils Absolute 0.1  0.0 - 0.1 K/uL   Smear Review Criteria for review not met    SEDIMENTATION RATE     Status: None   Collection Time    11/07/12 10:09 AM      Result Value Range   Sed Rate 4  0 - 22 mm/hr    Assessment/Plan: Rhus dermatitis Rx Prednisone taper.  Continue topical hydrocortisone.  Antihistamine BID and cold compresses for itch.  Discussed appropriate hygiene measures to help cut down on the spread of rash.  Follow-up if symptoms not improving.

## 2012-12-10 NOTE — Patient Instructions (Signed)
Please take medication as prescribed until all pills are gone.  Can continue with the hydrocortisone cream.  Claritin during the day and/or benadryl at night.  Cool compresses for itch.  Return if symptoms are not improving.

## 2012-12-20 ENCOUNTER — Other Ambulatory Visit: Payer: Self-pay

## 2012-12-20 MED ORDER — VALACYCLOVIR HCL 1 G PO TABS: 1000.0000 mg | ORAL_TABLET | Freq: Every day | ORAL | Status: DC

## 2013-01-04 ENCOUNTER — Telehealth: Payer: Self-pay

## 2013-01-04 NOTE — Telephone Encounter (Signed)
Patient left a message stating that she hasn't taken her BP medication in over 1 year.  Pts life insurance needs paperwork stating that she hasn't taken anything over 1 yr and a most recent BP reading?  Please advise?

## 2013-01-08 NOTE — Telephone Encounter (Signed)
Pt calling again, needs note for insurance stating she has not been on BP meds in over a year

## 2013-01-08 NOTE — Telephone Encounter (Signed)
Spoke with pt. Advised her that she has not seen Dr Abner Greenspan in a year but was seen twice by PA in our office this year and readings were elevated and borderline. Advised her she would likely need follow up in order for Dr Abner Greenspan to provide letter. She states that insurance is only wanting her most recent reading and a statement that pt has not taken her blood pressure medication within 1 year. She states she is not asking for the letter to state that she does not have hypertension or need medication. She states insurance company will look at most recent reading and decide what level she will qualify for.  Please advise.

## 2013-01-09 NOTE — Telephone Encounter (Signed)
Pt states she hasn't taken any bp medication since she stopped drinking in 6-13.  Letter done and pt informed she can come by to pick up

## 2013-01-09 NOTE — Telephone Encounter (Signed)
That is fine give her a letter saying she is not on BP meds and get the date straight that she last took a tab

## 2013-02-12 ENCOUNTER — Other Ambulatory Visit: Payer: Self-pay | Admitting: Dermatology

## 2013-04-10 ENCOUNTER — Ambulatory Visit (INDEPENDENT_AMBULATORY_CARE_PROVIDER_SITE_OTHER): Payer: 59 | Admitting: Physician Assistant

## 2013-04-10 ENCOUNTER — Encounter: Payer: Self-pay | Admitting: Physician Assistant

## 2013-04-10 VITALS — BP 126/84 | HR 94 | Temp 98.6°F | Resp 16 | Ht 64.0 in | Wt 172.8 lb

## 2013-04-10 DIAGNOSIS — J329 Chronic sinusitis, unspecified: Secondary | ICD-10-CM

## 2013-04-10 DIAGNOSIS — Z20818 Contact with and (suspected) exposure to other bacterial communicable diseases: Secondary | ICD-10-CM | POA: Insufficient documentation

## 2013-04-10 DIAGNOSIS — Z2089 Contact with and (suspected) exposure to other communicable diseases: Secondary | ICD-10-CM

## 2013-04-10 MED ORDER — AZITHROMYCIN 250 MG PO TABS: ORAL_TABLET | ORAL | Status: DC

## 2013-04-10 NOTE — Assessment & Plan Note (Signed)
Patient UTD on TDaP.  Does not have classic "whooping" cough, however exposure was only 1 weeks ago.  Rx Azithromycin for suspected sinusitis.  Will send patient to lab for Pertussis PCR.  Patient educated on signs/symptoms of infection.  The antibiotic for sinusitis should cover for Pertussis if the infection is present. Patient advised to avoid contact with infants and children until results are in.

## 2013-04-10 NOTE — Patient Instructions (Signed)
Take antibiotic as prescribed.  Increase fluid intake.  Rest.  Saline nasal spray. Mucinex. Humidifier in bedroom. Take a daily multivitamin.   Please call or return to clinic if symptoms are not improving.  I will call you with the results of the Pertussis testing.  Please read information below on Pertussis.  Avoid contact with small children until I have called you with your results.  Make sure to wash your hands and use Lysol to disinfection.  Pertussis, Adult Pertussis (whooping cough) is an infection that causes severe and sudden coughing attacks. CAUSES  Pertussis is caused by bacteria. It is very contagious and spreads to others by the droplets sprayed in the air when an infected person talks, coughs, and sneezes. You may have caught pertussis from inhaling these droplets or from touching a surface where the droplets fell and then touching the mouth or nose. SYMPTOMS  Early during this infection, symptoms of pertussis are similar to those of the common cold. They include a runny nose, low fever, mild cough, and red, watery eyes. After 1 2 weeks the cold symptoms get better, but the cough worsens and severe and sudden coughing attacks frequently develop. During these attacks people may cough so hard that vomiting occurs. Over the next month to 6 weeks, the cough starts to get better, but it may take as long as 6 months for the cough to go away completely. DIAGNOSIS  Your caregiver will perform a physical exam. The caregiver may take a mucus sample from the nose and throat and a blood sample to help confirm the diagnosis. The caregiver may also take a chest X-ray. TREATMENT  Antibiotic medications are usually prescribed for this infection. Starting antibiotics quickly may help shorten the illness and make it less contagious. Antibiotics may also be prescribed for everyone living in the same household. Immunization may be recommended for those in the household at risk of developing pertussis.  At-risk groups include:  Infants.  Those who have not had their full course of pertussis immunizations.  Those who were immunized but have not had their recent booster shot. Mild coughing may continue for months after the infection is treated from the remaining soreness and swelling (inflammation) in the lungs. HOME CARE INSTRUCTIONS   Take your antibiotics as directed. Finish them even if you start to feel better.  Do not take cough medication unless prescribed by your caregiver. Coughing is a protective mechanism that helps keep colored mucus (sputum) and secretions from clogging breathing passages.  Stay away from those who are at risk of developing pertussis for the first 5 days of antibiotic treatment. If no antibiotics are prescribed, stay at home for the first 3 weeks you are coughing.  Do not go to work until you have been treated with antibiotics for 5 days. If no antibiotics are prescribed, do not go to work for the first 3 weeks you are coughing. Inform your workplace that you were diagnosed with pertussis.  Wash your hands often. Those living in the same household should also wash their hands often to avoid spreading the infection.  Avoid substances that may irritate the lungs, such as smoke, aerosols, or fumes. These substances may worsen your coughing.  If you are having a coughing attack:  Raise the head of your mattress to help clear sputum more easily and improve breathing.  Sit upright.  Use a cool mist humidifier at home to increase air moisture. This will soothe your cough and help loosen sputum. Do not use  hot steam.  Rest as much as possible. Normal activity may be gradually resumed.  Drink enough fluids to keep your urine clear or pale yellow. PREVENTION  Pertussis can be prevented with a vaccine and later booster shots. The pertussis vaccine is usually given during childhood. Adults who were not previously vaccinated should be vaccinated as soon as possible.  Adults who were previously vaccinated should talk to their caregivers about the need for a booster shot because immunity from the vaccine decreases over time. All of the following persons should consider receiving a booster dose of pertussis, which is combined with tetanus and diphtheria (Tdap) vaccine:  Pregnant women during each pregnancy, preferably at 27 36 weeks of pregnancy (gestation).  All persons who have or will have close contact with an infant aged less than 12 months. Infants are at highest risk for life-threatening complications from pertussis.  All health care personnel. SEEK MEDICAL CARE IF:   You have persistent vomiting.  You are not able to eat or drink fluids.  You do not seem to be improving. SEEK IMMEDIATE MEDICAL CARE IF:   Your face turns red or blue during a coughing attack.  You become unconscious after a coughing attack, even if only for a few moments.  Your breathing stops for a period of time (apnea).  You are restless or cannot sleep.  You are listless or sleeping too much.  You have a fever. MAKE SURE YOU:  Understand these instructions.   Will watch your condition.   Will get help right away if you are not doing well or get worse. Document Released: 05/14/2012 Document Reviewed: 05/14/2012 [facility] Patient Information 2014 [place], [place].

## 2013-04-10 NOTE — Progress Notes (Signed)
Pre visit review using our clinic review tool, if applicable. No additional management support is needed unless otherwise documented below in the visit note/SLS  

## 2013-04-10 NOTE — Progress Notes (Signed)
Patient presents to clinic today c/o 1 week of sinus pressure, nasal congestion, chest congestion and nonproductive cough.  Denies fever, chills, aches.  Denies recent travel.  States she was in [place] this past week visiting friends and there were children present who had been recently diagnosed with whooping cough. Patient is not having a whooping cough at present.  Denies shortness of breath or wheezing. Symptom onset was gradual. Patient is UTD on her TDaP.  Past Medical History  Diagnosis Date  . Depression   . Anxiety   . Vitamin D deficiency   . HTN (hypertension)   . Alcohol abuse, in remission   . Raynaud's disease   . Hyperlipidemia     Current Outpatient Prescriptions on File Prior to Visit  Medication Sig Dispense Refill  . valACYclovir (VALTREX) 1000 MG tablet Take 1 tablet (1,000 mg total) by mouth daily.  90 tablet  1   No current facility-administered medications on file prior to visit.    No Known Allergies  Family History  Problem Relation Age of Onset  . Kidney failure Father 56    deceased  . Kidney disease Father   . Obesity Father   . Seizures Father   . Heart disease Father 57  . Schizophrenia Brother     37yo  . Alcohol abuse Brother   . Other Brother     chronic pain, 21 yo  . Alcohol abuse Sister     20yo  . Obesity Paternal Grandfather     History   Social History  . Marital Status: Married    Spouse Name: N/A    Number of Children: N/A  . Years of Education: N/A   Social History Main Topics  . Smoking status: Never Smoker   . Smokeless tobacco: Never Used  . Alcohol Use: 30.0 oz/week    50 Glasses of wine per week     Comment: 2-3 bottles of wine a night  . Drug Use: No     Comment: Ativan 4-5mg  daily  . Sexual Activity: Yes    Partners: Male    Birth Control/ Protection: Pill   Other Topics Concern  . None   Social History Narrative  . None   Review of Systems - See HPI.  All other ROS are negative.  BP 126/84  Pulse  94  Temp(Src) 98.6 F (37 C) (Oral)  Resp 16  Ht 5\' 4"  (1.626 m)  Wt 172 lb 12 oz (78.359 kg)  BMI 29.64 kg/m2  SpO2 98%  LMP 03/06/2013  Physical Exam  Vitals reviewed. Constitutional: She is oriented to person, place, and time.  HENT:  Head: Normocephalic and atraumatic.  Right Ear: External ear normal.  Left Ear: External ear normal.  Nose: Nose normal.  Mouth/Throat: Oropharynx is clear and moist. No oropharyngeal exudate.  TM within normal limits. Some mild TTP noted on exam.  Eyes: Conjunctivae are normal. Pupils are equal, round, and reactive to light.  Neck: Neck supple.  Cardiovascular: Normal rate, regular rhythm, normal heart sounds and intact distal pulses.   Pulmonary/Chest: Effort normal and breath sounds normal. No respiratory distress. She has no wheezes. She has no rales. She exhibits no tenderness.  Lymphadenopathy:    She has no cervical adenopathy.  Neurological: She is alert and oriented to person, place, and time.  Skin: Skin is warm and dry. No rash noted.  Psychiatric: Affect normal.    No results found for this or any previous visit (from the past 2160  hour(s)).  Assessment/Plan: Sinusitis Rx azithromycin.  Take antibiotic as prescribed.  Increase fluid intake.  Rest.  Saline nasal spray. Mucinex. Humidifier in bedroom. Please call or return to clinic if symptoms are not improving.   Pertussis exposure Patient UTD on TDaP.  Does not have classic "whooping" cough, however exposure was only 1 weeks ago.  Rx Azithromycin for suspected sinusitis.  Will send patient to lab for Pertussis PCR.  Patient educated on signs/symptoms of infection.  The antibiotic for sinusitis should cover for Pertussis if the infection is present. Patient advised to avoid contact with infants and children until results are in.

## 2013-04-10 NOTE — Assessment & Plan Note (Signed)
Rx azithromycin.  Take antibiotic as prescribed.  Increase fluid intake.  Rest.  Saline nasal spray. Mucinex. Humidifier in bedroom. Please call or return to clinic if symptoms are not improving.

## 2013-04-24 ENCOUNTER — Ambulatory Visit (INDEPENDENT_AMBULATORY_CARE_PROVIDER_SITE_OTHER): Payer: 59 | Admitting: Family

## 2013-04-24 ENCOUNTER — Encounter: Payer: Self-pay | Admitting: Family

## 2013-04-24 VITALS — BP 140/88 | HR 74 | Temp 98.2°F | Ht 64.0 in | Wt 173.1 lb

## 2013-04-24 DIAGNOSIS — H938X3 Other specified disorders of ear, bilateral: Secondary | ICD-10-CM | POA: Insufficient documentation

## 2013-04-24 DIAGNOSIS — J3489 Other specified disorders of nose and nasal sinuses: Secondary | ICD-10-CM

## 2013-04-24 DIAGNOSIS — J329 Chronic sinusitis, unspecified: Secondary | ICD-10-CM

## 2013-04-24 NOTE — Assessment & Plan Note (Signed)
Resolved

## 2013-04-24 NOTE — Progress Notes (Signed)
Subjective:    Patient ID: Cheryl Taylor, female    DOB: 1965-11-21, 48 y.o.   MRN: [id number]  HPI  Cheryl Taylor is a 48 yr old female who presents today for follow up of her sinusitis. She saw Darlin Drop PA-C on 3/11 and was treated with azithromycin.  Reports resolution of sinus pressure, now only has post nasal drip.  Reports progressive "fullness of her ears."  Denies otalgia. She denies recent fever. She using ibuprofen and otc generic antihistamines.    [facility] lab was unable to run pertussis PCR. Pt did not go to the health department to pursue testing.  Review of Systems See HPI  Past Medical History  Diagnosis Date  . Depression   . Anxiety   . Vitamin D deficiency   . HTN (hypertension)   . Alcohol abuse, in remission   . Raynaud's disease   . Hyperlipidemia     History   Social History  . Marital Status: Married    Spouse Name: N/A    Number of Children: N/A  . Years of Education: N/A   Occupational History  . Not on file.   Social History Main Topics  . Smoking status: Never Smoker   . Smokeless tobacco: Never Used  . Alcohol Use: 30.0 oz/week    50 Glasses of wine per week     Comment: 2-3 bottles of wine a night  . Drug Use: No     Comment: Ativan 4-5mg  daily  . Sexual Activity: Yes    Partners: Male    Birth Control/ Protection: Pill   Other Topics Concern  . Not on file   Social History Narrative  . No narrative on file    Past Surgical History  Procedure Laterality Date  . Cesarean section  2003  . Tonsillectomy  1980    Family History  Problem Relation Age of Onset  . Kidney failure Father 41    deceased  . Kidney disease Father   . Obesity Father   . Seizures Father   . Heart disease Father 3  . Schizophrenia Brother     67yo  . Alcohol abuse Brother   . Other Brother     chronic pain, 41 yo  . Alcohol abuse Sister     42yo  . Obesity Paternal Grandfather     No Known Allergies  Current Outpatient Prescriptions on  File Prior to Visit  Medication Sig Dispense Refill  . azithromycin (ZITHROMAX) 250 MG tablet Take 2 tablets on Day 1. Then take 1 tablet daily.  6 tablet  0  . valACYclovir (VALTREX) 1000 MG tablet Take 1 tablet (1,000 mg total) by mouth daily.  90 tablet  1   No current facility-administered medications on file prior to visit.    BP 140/88  Pulse 74  Temp(Src) 98.2 F (36.8 C) (Oral)  Ht 5\' 4"  (1.626 m)  Wt 173 lb 1.3 oz (78.509 kg)  BMI 29.69 kg/m2  SpO2 99%  LMP 03/06/2013       Objective:   Physical Exam  Constitutional: She is oriented to person, place, and time. She appears well-developed and well-nourished. No distress.  HENT:  Head: Normocephalic and atraumatic.  Clear fluid noted behind bilateral TM's without erythema or bulging  Eyes: No scleral icterus.  Cardiovascular: Normal rate and regular rhythm.   No murmur heard. Pulmonary/Chest: Effort normal and breath sounds normal. No respiratory distress. She has no wheezes. She has no rales. She exhibits no tenderness.  Lymphadenopathy:    She has no cervical adenopathy.  Neurological: She is alert and oriented to person, place, and time.  Psychiatric: She has a normal mood and affect. Her behavior is normal. Judgment and thought content normal.          Assessment & Plan:

## 2013-04-24 NOTE — Assessment & Plan Note (Addendum)
No clear sign of infection. I recommended that she transition to zyrtec.  Unfortunately, she has hx of hypertension so we need to avoid decongestants which would likely help her the most.  She is instructed to call if symptoms worsen, or if they do not improve over the next 1-2 weeks.

## 2013-04-24 NOTE — Progress Notes (Signed)
Pre visit review using our clinic review tool, if applicable. No additional management support is needed unless otherwise documented below in the visit note. 

## 2013-04-24 NOTE — Patient Instructions (Signed)
Start zyrtec once daily.  Call if symptoms worsen, or if not improved in 1-2 weeks.

## 2013-06-09 ENCOUNTER — Other Ambulatory Visit: Payer: Self-pay | Admitting: Family Medicine

## 2013-06-18 ENCOUNTER — Other Ambulatory Visit: Payer: Self-pay | Admitting: Family Medicine

## 2013-08-08 ENCOUNTER — Encounter: Payer: Self-pay | Admitting: Family Medicine

## 2013-08-27 ENCOUNTER — Ambulatory Visit: Payer: Self-pay | Admitting: Family Medicine

## 2013-09-18 ENCOUNTER — Other Ambulatory Visit: Payer: Self-pay | Admitting: Family Medicine

## 2013-11-15 ENCOUNTER — Encounter: Payer: Self-pay | Admitting: Family Medicine

## 2013-11-15 ENCOUNTER — Ambulatory Visit (INDEPENDENT_AMBULATORY_CARE_PROVIDER_SITE_OTHER): Payer: 59 | Admitting: Family Medicine

## 2013-11-15 ENCOUNTER — Ambulatory Visit: Payer: Self-pay | Admitting: Family Medicine

## 2013-11-15 VITALS — BP 134/88 | HR 82 | Temp 98.4°F | Ht 64.0 in | Wt 181.8 lb

## 2013-11-15 DIAGNOSIS — F329 Major depressive disorder, single episode, unspecified: Secondary | ICD-10-CM

## 2013-11-15 DIAGNOSIS — E785 Hyperlipidemia, unspecified: Secondary | ICD-10-CM

## 2013-11-15 DIAGNOSIS — I10 Essential (primary) hypertension: Secondary | ICD-10-CM

## 2013-11-15 DIAGNOSIS — F32A Depression, unspecified: Secondary | ICD-10-CM

## 2013-11-15 DIAGNOSIS — F1010 Alcohol abuse, uncomplicated: Secondary | ICD-10-CM

## 2013-11-15 DIAGNOSIS — N951 Menopausal and female climacteric states: Secondary | ICD-10-CM

## 2013-11-15 DIAGNOSIS — F1011 Alcohol abuse, in remission: Secondary | ICD-10-CM

## 2013-11-15 DIAGNOSIS — F419 Anxiety disorder, unspecified: Secondary | ICD-10-CM

## 2013-11-15 DIAGNOSIS — F418 Other specified anxiety disorders: Secondary | ICD-10-CM

## 2013-11-15 LAB — HEPATIC FUNCTION PANEL
ALT: 20 U/L (ref 0–35)
AST: 21 U/L (ref 0–37)
Albumin: 3.8 g/dL (ref 3.5–5.2)
Alkaline Phosphatase: 54 U/L (ref 39–117)
Bilirubin, Direct: 0 mg/dL (ref 0.0–0.3)
Total Bilirubin: 0.3 mg/dL (ref 0.2–1.2)
Total Protein: 7.6 g/dL (ref 6.0–8.3)

## 2013-11-15 LAB — CBC
HCT: 39.5 % (ref 36.0–46.0)
Hemoglobin: 13 g/dL (ref 12.0–15.0)
MCHC: 32.9 g/dL (ref 30.0–36.0)
MCV: 87.3 fl (ref 78.0–100.0)
Platelets: 304 10*3/uL (ref 150.0–400.0)
RBC: 4.52 Mil/uL (ref 3.87–5.11)
RDW: 13.2 % (ref 11.5–15.5)
WBC: 11.6 10*3/uL — ABNORMAL HIGH (ref 4.0–10.5)

## 2013-11-15 LAB — RENAL FUNCTION PANEL
Albumin: 3.8 g/dL (ref 3.5–5.2)
BUN: 14 mg/dL (ref 6–23)
CO2: 26 mEq/L (ref 19–32)
Calcium: 9.1 mg/dL (ref 8.4–10.5)
Chloride: 104 mEq/L (ref 96–112)
Creatinine, Ser: 0.9 mg/dL (ref 0.4–1.2)
GFR: 75.02 mL/min (ref >60.00)
Glucose, Bld: 89 mg/dL (ref 70–99)
Phosphorus: 3.4 mg/dL (ref 2.3–4.6)
Potassium: 3.7 mEq/L (ref 3.5–5.1)
Sodium: 135 mEq/L (ref 135–145)

## 2013-11-15 LAB — LIPID PANEL
Cholesterol: 156 mg/dL (ref 0–200)
HDL: 33.5 mg/dL — ABNORMAL LOW (ref >39.00)
LDL Cholesterol: 97 mg/dL (ref 0–99)
NonHDL: 122.5
Total CHOL/HDL Ratio: 5
Triglycerides: 127 mg/dL (ref 0.0–149.0)
VLDL: 25.4 mg/dL (ref 0.0–40.0)

## 2013-11-15 LAB — TSH: TSH: 0.84 u[IU]/mL (ref 0.35–4.50)

## 2013-11-15 MED ORDER — ESCITALOPRAM OXALATE 10 MG PO TABS: 10.0000 mg | ORAL_TABLET | Freq: Every day | ORAL | Status: DC

## 2013-11-15 NOTE — Progress Notes (Signed)
Patient ID: Cheryl Taylor, female   DOB: 22-May-1965, 48 y.o.   MRN: [id number] Cheryl Taylor [id number] 12-29-1965 11/15/2013      Progress Note-Follow Up  Subjective  Chief Complaint  Chief Complaint  Patient presents with  . possible menopause    HPI  Patient is a 48 year old femal in today for routine medical care. Patient struggling with worsening irritability, difficulty concentrating, hot flashes. No recent illness. Continues to abstain from drinking. Well controlled, no changes to meds. Encouraged heart healthy diet such as the DASH diet and exercise as tolerated.    Past Medical History  Diagnosis Date  . Depression   . Anxiety   . Vitamin D deficiency   . HTN (hypertension)   . Alcohol abuse, in remission   . Raynaud's disease   . Hyperlipidemia     Past Surgical History  Procedure Laterality Date  . Cesarean section  2003  . Tonsillectomy  1980    Family History  Problem Relation Age of Onset  . Kidney failure Father 13    deceased  . Kidney disease Father   . Obesity Father   . Seizures Father   . Heart disease Father 8  . Schizophrenia Brother     50yo  . Alcohol abuse Brother   . Other Brother     chronic pain, 75 yo  . Alcohol abuse Sister     35yo  . Obesity Paternal Grandfather     History   Social History  . Marital Status: Married    Spouse Name: N/A    Number of Children: N/A  . Years of Education: N/A   Occupational History  . Not on file.   Social History Main Topics  . Smoking status: Never Smoker   . Smokeless tobacco: Never Used  . Alcohol Use: 30.0 oz/week    50 Glasses of wine per week     Comment: 2-3 bottles of wine a night  . Drug Use: No     Comment: Ativan 4-5mg  daily  . Sexual Activity: Yes    Partners: Male    Birth Control/ Protection: Pill   Other Topics Concern  . Not on file   Social History Narrative  . No narrative on file     No Known Allergies  Review of Systems  Review of Systems   Constitutional: Negative for fever and malaise/fatigue.  HENT: Negative for congestion.   Eyes: Negative for discharge.  Respiratory: Negative for shortness of breath.   Cardiovascular: Negative for chest pain, palpitations and leg swelling.  Gastrointestinal: Negative for nausea, abdominal pain and diarrhea.  Genitourinary: Negative for dysuria.  Musculoskeletal: Negative for falls.  Skin: Negative for rash.  Neurological: Negative for loss of consciousness and headaches.  Endo/Heme/Allergies: Negative for polydipsia.  Psychiatric/Behavioral: Negative for depression and suicidal ideas. The patient is nervous/anxious. The patient does not have insomnia.     Objective  BP 150/80  Pulse 82  Temp(Src) 98.4 F (36.9 C) (Oral)  Ht 5\' 4"  (1.626 m)  Wt 181 lb 12.8 oz (82.464 kg)  BMI 31.19 kg/m2  SpO2 98%  LMP 11/01/2013  Physical Exam  Physical Exam  Constitutional: She is oriented to person, place, and time and well-developed, well-nourished, and in no distress. No distress.  HENT:  Head: Normocephalic and atraumatic.  Right Ear: External ear normal.  Left Ear: External ear normal.  Nose: Nose normal.  Mouth/Throat: Oropharynx is clear and moist. No oropharyngeal exudate.  Eyes: Conjunctivae are normal. Pupils  are equal, round, and reactive to light. Right eye exhibits no discharge. Left eye exhibits no discharge. No scleral icterus.  Neck: Normal range of motion. Neck supple. No thyromegaly present.  Cardiovascular: Normal rate, regular rhythm, normal heart sounds and intact distal pulses.   No murmur heard. Pulmonary/Chest: Effort normal and breath sounds normal. No respiratory distress. She has no wheezes. She has no rales.  Abdominal: Soft. Bowel sounds are normal. She exhibits no distension and no mass. There is no tenderness.  Musculoskeletal: Normal range of motion. She exhibits no edema and no tenderness.  Lymphadenopathy:    She has no cervical adenopathy.   Neurological: She is alert and oriented to person, place, and time. She has normal reflexes. No cranial nerve deficit. Coordination normal.  Skin: Skin is warm and dry. No rash noted. She is not diaphoretic.  Psychiatric: Mood, memory and affect normal.    Lab Results  Component Value Date   TSH 0.92 12/15/2011   Lab Results  Component Value Date   WBC 9.2 11/07/2012   HGB 12.8 11/07/2012   HCT 37.6 11/07/2012   MCV 83.2 11/07/2012   PLT 309 11/07/2012   Lab Results  Component Value Date   CREATININE 0.70 11/07/2012   BUN 8 11/07/2012   NA 138 11/07/2012   K 3.9 11/07/2012   CL 100 11/07/2012   CO2 28 11/07/2012   Lab Results  Component Value Date   ALT 31 11/07/2012   AST 28 11/07/2012   ALKPHOS 60 11/07/2012   BILITOT 0.5 11/07/2012   Lab Results  Component Value Date   CHOL 183 12/15/2011   Lab Results  Component Value Date   HDL 46.20 12/15/2011   Lab Results  Component Value Date   LDLCALC 118* 12/15/2011   Lab Results  Component Value Date   TRIG 96.0 12/15/2011   Lab Results  Component Value Date   CHOLHDL 4 12/15/2011     Assessment & Plan  Alcohol abuse, in remission Continues to abstain x 2+ years  HTN (hypertension) Improved on repeat. Well controlled, no changes to meds. Encouraged heart healthy diet such as the DASH diet and exercise as tolerated.   Hyperlipidemia Encouraged heart healthy diet, increase exercise, avoid trans fats, consider a krill oil cap daily  Depression Doing well on Lexapro. Continue the same  Perimenopausal Notes increased irritability, difficulty with concentration, hot flashes. Encouraged adequate exercise, sleep, small frequent meals with lean proteins and consider Icool prn, if no improvement let us know

## 2013-11-15 NOTE — Patient Instructions (Addendum)
Next visit GYN    elatonin or L Tryptophan for sleep  [email or web address] Triclosan in soaps, avoid   Insomnia Insomnia is frequent trouble falling and/or staying asleep. Insomnia can be a long term problem or a short term problem. Both are common. Insomnia can be a short term problem when the wakefulness is related to a certain stress or worry. Long term insomnia is often related to ongoing stress during waking hours and/or poor sleeping habits. Overtime, sleep deprivation itself can make the problem worse. Every little thing feels more severe because you are overtired and your ability to cope is decreased. CAUSES   Stress, anxiety, and depression.  Poor sleeping habits.  Distractions such as TV in the bedroom.  Naps close to bedtime.  Engaging in emotionally charged conversations before bed.  Technical reading before sleep.  Alcohol and other sedatives. They may make the problem worse. They can hurt normal sleep patterns and normal dream activity.  Stimulants such as caffeine for several hours prior to bedtime.  Pain syndromes and shortness of breath can cause insomnia.  Exercise late at night.  Changing time zones may cause sleeping problems (jet lag). It is sometimes helpful to have someone observe your sleeping patterns. They should look for periods of not breathing during the night (sleep apnea). They should also look to see how long those periods last. If you live alone or observers are uncertain, you can also be observed at a sleep clinic where your sleep patterns will be professionally monitored. Sleep apnea requires a checkup and treatment. Give your caregivers your medical history. Give your caregivers observations your family has made about your sleep.  SYMPTOMS   Not feeling rested in the morning.  Anxiety and restlessness at bedtime.  Difficulty falling and staying asleep. TREATMENT   Your caregiver may prescribe treatment for an underlying  medical disorders. Your caregiver can give advice or help if you are using alcohol or other drugs for self-medication. Treatment of underlying problems will usually eliminate insomnia problems.  Medications can be prescribed for short time use. They are generally not recommended for lengthy use.  Over-the-counter sleep medicines are not recommended for lengthy use. They can be habit forming.  You can promote easier sleeping by making lifestyle changes such as:  Using relaxation techniques that help with breathing and reduce muscle tension.  Exercising earlier in the day.  Changing your diet and the time of your last meal. No night time snacks.  Establish a regular time to go to bed.  Counseling can help with stressful problems and worry.  Soothing music and white noise may be helpful if there are background noises you cannot remove.  Stop tedious detailed work at least one hour before bedtime. HOME CARE INSTRUCTIONS   Keep a diary. Inform your caregiver about your progress. This includes any medication side effects. See your caregiver regularly. Take note of:  Times when you are asleep.  Times when you are awake during the night.  The quality of your sleep.  How you feel the next day. This information will help your caregiver care for you.  Get out of bed if you are still awake after 15 minutes. Read or do some quiet activity. Keep the lights down. Wait until you feel sleepy and go back to bed.  Keep regular sleeping and waking hours. Avoid naps.  Exercise regularly.  Avoid distractions at bedtime. Distractions include watching television or engaging in any intense or detailed activity like attempting to balance the  household checkbook.  Develop a bedtime ritual. Keep a familiar routine of bathing, brushing your teeth, climbing into bed at the same time each night, listening to soothing music. Routines increase the success of falling to sleep faster.  Use relaxation  techniques. This can be using breathing and muscle tension release routines. It can also include visualizing peaceful scenes. You can also help control troubling or intruding thoughts by keeping your mind occupied with boring or repetitive thoughts like the old concept of counting sheep. You can make it more creative like imagining planting one beautiful flower after another in your backyard garden.  During your day, work to eliminate stress. When this is not possible use some of the previous suggestions to help reduce the anxiety that accompanies stressful situations. MAKE SURE YOU:   Understand these instructions.  Will watch your condition.  Will get help right away if you are not doing well or get worse. Document Released: 01/15/2000 Document Revised: 04/11/2011 Document Reviewed: 02/14/2007 [facility] Patient Information 2015 [place], [place]. This information is not intended to replace advice given to you by your health care provider. Make sure you discuss any questions you have with your health care provider.

## 2013-11-15 NOTE — Progress Notes (Signed)
Pre visit review using our clinic review tool, if applicable. No additional management support is needed unless otherwise documented below in the visit note. 

## 2013-11-18 ENCOUNTER — Telehealth: Payer: Self-pay | Admitting: Family Medicine

## 2013-11-18 ENCOUNTER — Encounter: Payer: Self-pay | Admitting: Family Medicine

## 2013-11-18 DIAGNOSIS — N951 Menopausal and female climacteric states: Secondary | ICD-10-CM

## 2013-11-18 DIAGNOSIS — F329 Major depressive disorder, single episode, unspecified: Secondary | ICD-10-CM

## 2013-11-18 DIAGNOSIS — F32A Depression, unspecified: Secondary | ICD-10-CM

## 2013-11-18 DIAGNOSIS — F419 Anxiety disorder, unspecified: Principal | ICD-10-CM

## 2013-11-18 HISTORY — DX: Menopausal and female climacteric states: N95.1

## 2013-11-18 MED ORDER — ESCITALOPRAM OXALATE 10 MG PO TABS: 10.0000 mg | ORAL_TABLET | Freq: Every day | ORAL | Status: DC

## 2013-11-18 NOTE — Assessment & Plan Note (Signed)
Improved on repeat. Well controlled, no changes to meds. Encouraged heart healthy diet such as the DASH diet and exercise as tolerated.  

## 2013-11-18 NOTE — Assessment & Plan Note (Signed)
Doing well on Lexapro. Continue the same

## 2013-11-18 NOTE — Telephone Encounter (Signed)
Caller name: Rodney, Wigger Relation to pt: self  Call back number: [phone] Pharmacy: [facility] [address] [place], [place] [address] [phone]   Reason for call: pt requesting a week worth of pills escitalopram (LEXAPRO) 10 MG tablet to hold her over until rx comes in the mail

## 2013-11-18 NOTE — Assessment & Plan Note (Signed)
Notes increased irritability, difficulty with concentration, hot flashes. Encouraged adequate exercise, sleep, small frequent meals with lean proteins and consider Icool prn, if no improvement let us know

## 2013-11-18 NOTE — Assessment & Plan Note (Signed)
Continues to abstain x 2+ years

## 2013-11-18 NOTE — Assessment & Plan Note (Signed)
Encouraged heart healthy diet, increase exercise, avoid trans fats, consider a krill oil cap daily 

## 2013-11-20 ENCOUNTER — Other Ambulatory Visit: Payer: Self-pay

## 2013-11-20 DIAGNOSIS — Z1231 Encounter for screening mammogram for malignant neoplasm of breast: Secondary | ICD-10-CM

## 2013-12-02 ENCOUNTER — Encounter: Payer: Self-pay | Admitting: Family Medicine

## 2013-12-02 DIAGNOSIS — F329 Major depressive disorder, single episode, unspecified: Secondary | ICD-10-CM

## 2013-12-02 DIAGNOSIS — F32A Depression, unspecified: Secondary | ICD-10-CM

## 2013-12-02 DIAGNOSIS — F419 Anxiety disorder, unspecified: Principal | ICD-10-CM

## 2013-12-03 MED ORDER — ESCITALOPRAM OXALATE 20 MG PO TABS: 20.0000 mg | ORAL_TABLET | Freq: Every day | ORAL | Status: DC

## 2013-12-03 NOTE — Addendum Note (Signed)
Addended by: Varney Daily on: 12/03/2013 10:14 AM   Modules accepted: Orders

## 2013-12-13 ENCOUNTER — Ambulatory Visit: Payer: Self-pay | Admitting: Family Medicine

## 2013-12-20 ENCOUNTER — Encounter: Payer: Self-pay | Admitting: Family Medicine

## 2013-12-20 MED ORDER — ESCITALOPRAM OXALATE 20 MG PO TABS: 20.0000 mg | ORAL_TABLET | Freq: Every day | ORAL | Status: DC

## 2013-12-30 MED ORDER — VALACYCLOVIR HCL 1 G PO TABS: 1000.0000 mg | ORAL_TABLET | Freq: Every day | ORAL | Status: DC

## 2013-12-30 NOTE — Addendum Note (Signed)
Addended by: Varney Daily on: 12/30/2013 02:18 PM   Modules accepted: Orders

## 2013-12-31 ENCOUNTER — Ambulatory Visit: Payer: Self-pay | Admitting: Family Medicine

## 2014-01-01 ENCOUNTER — Encounter: Payer: Self-pay | Admitting: Family Medicine

## 2014-01-02 MED ORDER — NORETHINDRONE 0.35 MG PO TABS: 1.0000 | ORAL_TABLET | Freq: Every day | ORAL | Status: DC

## 2014-01-06 ENCOUNTER — Ambulatory Visit: Payer: Self-pay

## 2014-01-14 ENCOUNTER — Ambulatory Visit: Payer: Self-pay | Admitting: Family Medicine

## 2014-02-06 ENCOUNTER — Ambulatory Visit
Admission: RE | Admit: 2014-02-06 | Discharge: 2014-02-06 | Disposition: A | Discharge status: Home / Self Care | Payer: 59 | Source: Ambulatory Visit

## 2014-02-06 DIAGNOSIS — Z1231 Encounter for screening mammogram for malignant neoplasm of breast: Secondary | ICD-10-CM

## 2014-03-16 ENCOUNTER — Other Ambulatory Visit: Payer: Self-pay | Admitting: Family Medicine

## 2014-03-20 ENCOUNTER — Ambulatory Visit (INDEPENDENT_AMBULATORY_CARE_PROVIDER_SITE_OTHER): Payer: 59 | Admitting: Family Medicine

## 2014-03-20 ENCOUNTER — Encounter: Payer: Self-pay | Admitting: Family Medicine

## 2014-03-20 VITALS — BP 130/78 | HR 78 | Temp 98.2°F | Ht 64.0 in | Wt 179.1 lb

## 2014-03-20 DIAGNOSIS — I10 Essential (primary) hypertension: Secondary | ICD-10-CM

## 2014-03-20 DIAGNOSIS — F32A Depression, unspecified: Secondary | ICD-10-CM

## 2014-03-20 DIAGNOSIS — E785 Hyperlipidemia, unspecified: Secondary | ICD-10-CM

## 2014-03-20 DIAGNOSIS — F329 Major depressive disorder, single episode, unspecified: Secondary | ICD-10-CM

## 2014-03-20 DIAGNOSIS — Z7251 High risk heterosexual behavior: Secondary | ICD-10-CM

## 2014-03-20 DIAGNOSIS — E559 Vitamin D deficiency, unspecified: Secondary | ICD-10-CM

## 2014-03-20 MED ORDER — ESCITALOPRAM OXALATE 20 MG PO TABS: 20.0000 mg | ORAL_TABLET | Freq: Every day | ORAL | Status: DC

## 2014-03-20 NOTE — Progress Notes (Signed)
Pre visit review using our clinic review tool, if applicable. No additional management support is needed unless otherwise documented below in the visit note. 

## 2014-03-20 NOTE — Progress Notes (Signed)
Cheryl Taylor  [id number] 1965/07/03 03/20/2014      Progress Note-Follow Up  Subjective  Chief Complaint  Chief Complaint  Patient presents with  . Follow-up    HPI  Patient is a 49 y.o. female in today for routine medical care. Patient is in today for follow-up and doing very well. She's had no relapses in her alcohol use and reports the Lexapro is managing her depression well. No recent illness or acute concerns. Is doing very well at work. Denies CP/palp/SOB/HA/congestion/fevers/GI or GU c/o. Taking meds as prescribed  Past Medical History  Diagnosis Date  . Depression   . Anxiety   . Vitamin D deficiency   . HTN (hypertension)   . Alcohol abuse, in remission   . Raynaud's disease   . Hyperlipidemia   . Perimenopausal 11/18/2013    Past Surgical History  Procedure Laterality Date  . Cesarean section  2003  . Tonsillectomy  1980    Family History  Problem Relation Age of Onset  . Kidney failure Father 1    deceased  . Kidney disease Father   . Obesity Father   . Seizures Father   . Heart disease Father 17  . Schizophrenia Brother     80yo  . Alcohol abuse Brother   . Other Brother     chronic pain, 62 yo  . Alcohol abuse Sister     13yo  . Obesity Paternal Grandfather     History   Social History  . Marital Status: Married    Spouse Name: N/A  . Number of Children: N/A  . Years of Education: N/A   Occupational History  . Not on file.   Social History Main Topics  . Smoking status: Never Smoker   . Smokeless tobacco: Never Used  . Alcohol Use: 30.0 oz/week    50 Glasses of wine per week     Comment: 2-3 bottles of wine a night  . Drug Use: No     Comment: Ativan 4-5mg  daily  . Sexual Activity:    Partners: Male    Birth Control/ Protection: Pill   Other Topics Concern  . Not on file   Social History Narrative    Current Outpatient Prescriptions on File Prior to Visit  Medication Sig Dispense Refill  . escitalopram (LEXAPRO) 20  MG tablet Take 1 tablet (20 mg total) by mouth daily. 90 tablet 0  . HEATHER 0.35 MG tablet TAKE 1 TABLET DAILY 3 tablet 0  . valACYclovir (VALTREX) 1000 MG tablet Take 1 tablet (1,000 mg total) by mouth daily. 90 tablet 0   No current facility-administered medications on file prior to visit.    No Known Allergies  Review of Systems  Review of Systems  Constitutional: Negative for fever and malaise/fatigue.  HENT: Negative for congestion.   Eyes: Negative for discharge.  Respiratory: Negative for shortness of breath.   Cardiovascular: Negative for chest pain, palpitations and leg swelling.  Gastrointestinal: Negative for nausea, abdominal pain and diarrhea.  Genitourinary: Negative for dysuria.  Musculoskeletal: Negative for falls.  Skin: Negative for rash.  Neurological: Negative for loss of consciousness and headaches.  Endo/Heme/Allergies: Negative for polydipsia.  Psychiatric/Behavioral: Negative for depression and suicidal ideas. The patient is not nervous/anxious and does not have insomnia.     Objective  BP 142/88 mmHg  Pulse 78  Temp(Src) 98.2 F (36.8 C) (Oral)  Ht 5\' 4"  (1.626 m)  Wt 179 lb 2 oz (81.251 kg)  BMI 30.73 kg/m2  SpO2 97%  Physical Exam  Physical Exam  Constitutional: She is oriented to person, place, and time and well-developed, well-nourished, and in no distress. No distress.  HENT:  Head: Normocephalic and atraumatic.  Eyes: Conjunctivae are normal.  Neck: Neck supple. No thyromegaly present.  Cardiovascular: Normal rate, regular rhythm and normal heart sounds.   No murmur heard. Pulmonary/Chest: Effort normal and breath sounds normal. She has no wheezes.  Abdominal: She exhibits no distension and no mass.  Musculoskeletal: She exhibits no edema.  Lymphadenopathy:    She has no cervical adenopathy.  Neurological: She is alert and oriented to person, place, and time.  Skin: Skin is warm and dry. No rash noted. She is not diaphoretic.    Psychiatric: Memory, affect and judgment normal.    Lab Results  Component Value Date   TSH 0.84 11/15/2013   Lab Results  Component Value Date   WBC 11.6* 11/15/2013   HGB 13.0 11/15/2013   HCT 39.5 11/15/2013   MCV 87.3 11/15/2013   PLT 304.0 11/15/2013   Lab Results  Component Value Date   CREATININE 0.9 11/15/2013   BUN 14 11/15/2013   NA 135 11/15/2013   K 3.7 11/15/2013   CL 104 11/15/2013   CO2 26 11/15/2013   Lab Results  Component Value Date   ALT 20 11/15/2013   AST 21 11/15/2013   ALKPHOS 54 11/15/2013   BILITOT 0.3 11/15/2013   Lab Results  Component Value Date   CHOL 156 11/15/2013   Lab Results  Component Value Date   HDL 33.50* 11/15/2013   Lab Results  Component Value Date   LDLCALC 97 11/15/2013   Lab Results  Component Value Date   TRIG 127.0 11/15/2013   Lab Results  Component Value Date   CHOLHDL 5 11/15/2013     Assessment & Plan  HTN (hypertension) Well controlled, no changes to meds. Encouraged heart healthy diet such as the DASH diet and exercise as tolerated.    Hyperlipidemia Encouraged heart healthy diet, increase exercise, avoid trans fats, consider a krill oil cap daily   Depression Doing very well on lexapro. No changes   Vitamin D deficiency Consider Vitamin D 2000 IU daily

## 2014-03-20 NOTE — Patient Instructions (Signed)

## 2014-03-26 ENCOUNTER — Other Ambulatory Visit: Payer: Self-pay | Admitting: Family Medicine

## 2014-03-31 NOTE — Assessment & Plan Note (Signed)
Consider Vitamin D 2000 IU daily

## 2014-03-31 NOTE — Assessment & Plan Note (Signed)
Well controlled, no changes to meds. Encouraged heart healthy diet such as the DASH diet and exercise as tolerated.  °

## 2014-03-31 NOTE — Assessment & Plan Note (Signed)
Encouraged heart healthy diet, increase exercise, avoid trans fats, consider a krill oil cap daily 

## 2014-03-31 NOTE — Assessment & Plan Note (Signed)
Doing very well on lexapro. No changes

## 2014-05-28 ENCOUNTER — Other Ambulatory Visit: Payer: Self-pay | Admitting: Family Medicine

## 2014-05-29 NOTE — Telephone Encounter (Signed)
Faxed hardcopy for [employer] to [facility].

## 2014-06-06 ENCOUNTER — Other Ambulatory Visit: Payer: Self-pay | Admitting: Family Medicine

## 2014-07-19 ENCOUNTER — Other Ambulatory Visit: Payer: Self-pay | Admitting: Family Medicine

## 2014-07-21 ENCOUNTER — Other Ambulatory Visit: Payer: Self-pay | Admitting: Family Medicine

## 2014-07-21 ENCOUNTER — Telehealth: Payer: Self-pay | Admitting: Family Medicine

## 2014-07-21 MED ORDER — NORETHINDRONE 0.35 MG PO TABS: 1.0000 | ORAL_TABLET | Freq: Every day | ORAL | Status: DC

## 2014-07-21 NOTE — Telephone Encounter (Signed)
Sent in to local and mail order and pt. informed

## 2014-07-21 NOTE — Telephone Encounter (Signed)
Relation to pt: self  Call back number: [phone] Pharmacy:[facility] [address] [place], [place] [address] [phone]   Reason for call:  Pt requesting a 1 month supply norethindrone (HEATHER) 0.35 MG tablet please send to [facility] [address], [place], [place] [address]. Pt states she took her last pill today.

## 2014-08-01 ENCOUNTER — Encounter: Payer: Self-pay | Admitting: Family Medicine

## 2014-08-01 ENCOUNTER — Ambulatory Visit (INDEPENDENT_AMBULATORY_CARE_PROVIDER_SITE_OTHER): Payer: 59 | Admitting: Family Medicine

## 2014-08-01 VITALS — BP 142/98 | HR 81 | Temp 98.5°F | Ht 64.0 in | Wt 182.1 lb

## 2014-08-01 DIAGNOSIS — E559 Vitamin D deficiency, unspecified: Secondary | ICD-10-CM | POA: Diagnosis not present

## 2014-08-01 DIAGNOSIS — G4700 Insomnia, unspecified: Secondary | ICD-10-CM | POA: Diagnosis not present

## 2014-08-01 DIAGNOSIS — F1011 Alcohol abuse, in remission: Secondary | ICD-10-CM

## 2014-08-01 DIAGNOSIS — I10 Essential (primary) hypertension: Secondary | ICD-10-CM | POA: Diagnosis not present

## 2014-08-01 DIAGNOSIS — E669 Obesity, unspecified: Secondary | ICD-10-CM

## 2014-08-01 DIAGNOSIS — E785 Hyperlipidemia, unspecified: Secondary | ICD-10-CM

## 2014-08-01 DIAGNOSIS — F1010 Alcohol abuse, uncomplicated: Secondary | ICD-10-CM

## 2014-08-01 LAB — CBC
HCT: 42.1 % (ref 36.0–46.0)
Hemoglobin: 14.1 g/dL (ref 12.0–15.0)
MCHC: 33.5 g/dL (ref 30.0–36.0)
MCV: 87.6 fl (ref 78.0–100.0)
Platelets: 275 10*3/uL (ref 150.0–400.0)
RBC: 4.8 Mil/uL (ref 3.87–5.11)
RDW: 13.3 % (ref 11.5–15.5)
WBC: 9.3 10*3/uL (ref 4.0–10.5)

## 2014-08-01 LAB — COMPREHENSIVE METABOLIC PANEL
ALT: 24 U/L (ref 0–35)
AST: 22 U/L (ref 0–37)
Albumin: 4.5 g/dL (ref 3.5–5.2)
Alkaline Phosphatase: 57 U/L (ref 39–117)
BUN: 13 mg/dL (ref 6–23)
CO2: 27 mEq/L (ref 19–32)
Calcium: 9.7 mg/dL (ref 8.4–10.5)
Chloride: 103 mEq/L (ref 96–112)
Creatinine, Ser: 0.71 mg/dL (ref 0.40–1.20)
GFR: 92.07 mL/min (ref >60.00)
Glucose, Bld: 76 mg/dL (ref 70–99)
Potassium: 3.7 mEq/L (ref 3.5–5.1)
Sodium: 137 mEq/L (ref 135–145)
Total Bilirubin: 0.3 mg/dL (ref 0.2–1.2)
Total Protein: 7.8 g/dL (ref 6.0–8.3)

## 2014-08-01 LAB — LIPID PANEL
Cholesterol: 193 mg/dL (ref 0–200)
HDL: 42 mg/dL (ref >39.00)
LDL Cholesterol: 122 mg/dL — ABNORMAL HIGH (ref 0–99)
NonHDL: 151
Total CHOL/HDL Ratio: 5
Triglycerides: 146 mg/dL (ref 0.0–149.0)
VLDL: 29.2 mg/dL (ref 0.0–40.0)

## 2014-08-01 LAB — VITAMIN D 25 HYDROXY (VIT D DEFICIENCY, FRACTURES): VITD: 23.1 ng/mL — ABNORMAL LOW (ref 30.00–100.00)

## 2014-08-01 LAB — TSH: TSH: 2.91 u[IU]/mL (ref 0.35–4.50)

## 2014-08-01 NOTE — Progress Notes (Signed)
Cheryl Taylor  [id number] [birth year] 08/01/2014      Progress Note-Follow Up  Subjective  Chief Complaint  Chief Complaint  Patient presents with  . Hypertension    HPI  Patient is a 48 y.o. female in today for routine medical care. Patient is here today to discuss increasing blood pressures. She has been laid off from her job and has gained weight as a result. She notes her blood pressures been trending higher in the 140s over 90s frequently. She denies any concerning symptoms with this but she does note her sister had a stroke at age 32. No other recent illness or acute complaints. Denies CP/palp/SOB/HA/congestion/fevers/GI or GU c/o. Taking meds as prescribed  Past Medical History  Diagnosis Date  . Depression   . Anxiety   . Vitamin D deficiency   . HTN (hypertension)   . Alcohol abuse, in remission   . Raynaud's disease   . Hyperlipidemia   . Perimenopausal 11/18/2013    Past Surgical History  Procedure Laterality Date  . Cesarean section  2003  . Tonsillectomy  1980    Family History  Problem Relation Age of Onset  . Kidney failure Father 57    deceased  . Kidney disease Father   . Obesity Father   . Seizures Father   . Heart disease Father 38  . Schizophrenia Brother     65yo  . Alcohol abuse Brother   . Other Brother     chronic pain, 35 yo  . Alcohol abuse Sister     72yo  . Obesity Paternal Grandfather     History   Social History  . Marital Status: Married    Spouse Name: N/A  . Number of Children: N/A  . Years of Education: N/A   Occupational History  . Not on file.   Social History Main Topics  . Smoking status: Never Smoker   . Smokeless tobacco: Never Used  . Alcohol Use: 30.0 oz/week    50 Glasses of wine per week     Comment: 2-3 bottles of wine a night  . Drug Use: No     Comment: Ativan 4-5mg  daily  . Sexual Activity:    Partners: Male    Birth Control/ Protection: Pill   Other Topics Concern  . Not on file   Social  History Narrative    Current Outpatient Prescriptions on File Prior to Visit  Medication Sig Dispense Refill  . escitalopram (LEXAPRO) 20 MG tablet Take 1 tablet (20 mg total) by mouth daily. 90 tablet 2  . norethindrone (HEATHER) 0.35 MG tablet Take 1 tablet (0.35 mg total) by mouth daily. 1 Package 0  . valACYclovir (VALTREX) 1000 MG tablet TAKE 1 TABLET DAILY 90 tablet 0   No current facility-administered medications on file prior to visit.    No Known Allergies  Review of Systems  Review of Systems  Constitutional: Negative for fever and malaise/fatigue.  HENT: Negative for congestion.   Eyes: Negative for discharge.  Respiratory: Negative for shortness of breath.   Cardiovascular: Negative for chest pain, palpitations and leg swelling.  Gastrointestinal: Negative for nausea, abdominal pain and diarrhea.  Genitourinary: Negative for dysuria.  Musculoskeletal: Negative for falls.  Skin: Negative for rash.  Neurological: Negative for loss of consciousness and headaches.  Endo/Heme/Allergies: Negative for polydipsia.  Psychiatric/Behavioral: Positive for depression. Negative for suicidal ideas. The patient is nervous/anxious. The patient does not have insomnia.     Objective  BP 142/98 mmHg  Pulse  81  Temp(Src) 98.5 F (36.9 C) (Oral)  Ht 5\' 4"  (1.626 m)  Wt 182 lb 2 oz (82.611 kg)  BMI 31.25 kg/m2  SpO2 96%  Physical Exam  Physical Exam  Constitutional: She is oriented to person, place, and time and well-developed, well-nourished, and in no distress. No distress.  HENT:  Head: Normocephalic and atraumatic.  Eyes: Conjunctivae are normal.  Neck: Neck supple. No thyromegaly present.  Cardiovascular: Normal rate, regular rhythm and normal heart sounds.   No murmur heard. Pulmonary/Chest: Effort normal and breath sounds normal. She has no wheezes.  Abdominal: She exhibits no distension and no mass.  Musculoskeletal: She exhibits no edema.  Lymphadenopathy:    She  has no cervical adenopathy.  Neurological: She is alert and oriented to person, place, and time. No cranial nerve deficit.  Skin: Skin is warm and dry. No rash noted. She is not diaphoretic.  Psychiatric: Memory, affect and judgment normal.    Lab Results  Component Value Date   TSH 0.84 11/15/2013   Lab Results  Component Value Date   WBC 11.6* 11/15/2013   HGB 13.0 11/15/2013   HCT 39.5 11/15/2013   MCV 87.3 11/15/2013   PLT 304.0 11/15/2013   Lab Results  Component Value Date   CREATININE 0.9 11/15/2013   BUN 14 11/15/2013   NA 135 11/15/2013   K 3.7 11/15/2013   CL 104 11/15/2013   CO2 26 11/15/2013   Lab Results  Component Value Date   ALT 20 11/15/2013   AST 21 11/15/2013   ALKPHOS 54 11/15/2013   BILITOT 0.3 11/15/2013   Lab Results  Component Value Date   CHOL 156 11/15/2013   Lab Results  Component Value Date   HDL 33.50* 11/15/2013   Lab Results  Component Value Date   LDLCALC 97 11/15/2013   Lab Results  Component Value Date   TRIG 127.0 11/15/2013   Lab Results  Component Value Date   CHOLHDL 5 11/15/2013     Assessment & Plan

## 2014-08-01 NOTE — Assessment & Plan Note (Signed)
Not well controlled, no changes to meds. Encouraged heart healthy diet such as the DASH diet and exercise as tolerated.  

## 2014-08-01 NOTE — Assessment & Plan Note (Signed)
Encouraged heart healthy diet, increase exercise, avoid trans fats, consider a krill oil cap daily 

## 2014-08-01 NOTE — Patient Instructions (Addendum)
Krill oil caps daily Vitamin D 2000 IU daily Curcumen daily Probiotic daily Grayling 10 strain probiotic [email or web address] Aged or Black garlic daily DASH diet Exercise Sleep   Hypertension Hypertension, commonly called high blood pressure, is when the force of blood pumping through your arteries is too strong. Your arteries are the blood vessels that carry blood from your heart throughout your body. A blood pressure reading consists of a higher number over a lower number, such as 110/72. The higher number (systolic) is the pressure inside your arteries when your heart pumps. The lower number (diastolic) is the pressure inside your arteries when your heart relaxes. Ideally you want your blood pressure below 120/80. Hypertension forces your heart to work harder to pump blood. Your arteries may become narrow or stiff. Having hypertension puts you at risk for heart disease, stroke, and other problems.  RISK FACTORS Some risk factors for high blood pressure are controllable. Others are not.  Risk factors you cannot control include:   Race. You may be at higher risk if you are African American.  Age. Risk increases with age.  Gender. Men are at higher risk than women before age 34 years. After age 41, women are at higher risk than men. Risk factors you can control include:  Not getting enough exercise or physical activity.  Being overweight.  Getting too much fat, sugar, calories, or salt in your diet.  Drinking too much alcohol. SIGNS AND SYMPTOMS Hypertension does not usually cause signs or symptoms. Extremely high blood pressure (hypertensive crisis) may cause headache, anxiety, shortness of breath, and nosebleed. DIAGNOSIS  To check if you have hypertension, your health care provider will measure your blood pressure while you are seated, with your arm held at the level of your heart. It should be measured at least twice using the same arm. Certain conditions can cause a  difference in blood pressure between your right and left arms. A blood pressure reading that is higher than normal on one occasion does not mean that you need treatment. If one blood pressure reading is high, ask your health care provider about having it checked again. TREATMENT  Treating high blood pressure includes making lifestyle changes and possibly taking medicine. Living a healthy lifestyle can help lower high blood pressure. You may need to change some of your habits. Lifestyle changes may include:  Following the DASH diet. This diet is high in fruits, vegetables, and whole grains. It is low in salt, red meat, and added sugars.  Getting at least 2 hours of brisk physical activity every week.  Losing weight if necessary.  Not smoking.  Limiting alcoholic beverages.  Learning ways to reduce stress. If lifestyle changes are not enough to get your blood pressure under control, your health care provider may prescribe medicine. You may need to take more than one. Work closely with your health care provider to understand the risks and benefits. HOME CARE INSTRUCTIONS  Have your blood pressure rechecked as directed by your health care provider.   Take medicines only as directed by your health care provider. Follow the directions carefully. Blood pressure medicines must be taken as prescribed. The medicine does not work as well when you skip doses. Skipping doses also puts you at risk for problems.   Do not smoke.   Monitor your blood pressure at home as directed by your health care provider. SEEK MEDICAL CARE IF:   You think you are having a reaction to medicines taken.  You have recurrent headaches  or feel dizzy.  You have swelling in your ankles.  You have trouble with your vision. SEEK IMMEDIATE MEDICAL CARE IF:  You develop a severe headache or confusion.  You have unusual weakness, numbness, or feel faint.  You have severe chest or abdominal pain.  You vomit  repeatedly.  You have trouble breathing. MAKE SURE YOU:   Understand these instructions.  Will watch your condition.  Will get help right away if you are not doing well or get worse. Document Released: 01/17/2005 Document Revised: 06/03/2013 Document Reviewed: 11/09/2012 [facility] Patient Information 2015 [place], [place]. This information is not intended to replace advice given to you by your health care provider. Make sure you discuss any questions you have with your health care provider.

## 2014-08-01 NOTE — Progress Notes (Signed)
Pre visit review using our clinic review tool, if applicable. No additional management support is needed unless otherwise documented below in the visit note. 

## 2014-08-02 MED ORDER — VITAMIN D (ERGOCALCIFEROL) 1.25 MG (50000 UNIT) PO CAPS: 50000.0000 [IU] | ORAL_CAPSULE | ORAL | Status: DC

## 2014-08-10 ENCOUNTER — Encounter: Payer: Self-pay | Admitting: Family Medicine

## 2014-08-10 DIAGNOSIS — E669 Obesity, unspecified: Secondary | ICD-10-CM

## 2014-08-10 HISTORY — DX: Obesity, unspecified: E66.9

## 2014-08-10 NOTE — Assessment & Plan Note (Signed)
Continues to abstain 

## 2014-08-10 NOTE — Assessment & Plan Note (Signed)
Encouraged DASH diet, decrease po intake and increase exercise as tolerated. Needs 7-8 hours of sleep nightly. Avoid trans fats, eat small, frequent meals every 4-5 hours with lean proteins, complex carbs and healthy fats. Minimize simple carbs 

## 2014-08-10 NOTE — Assessment & Plan Note (Signed)
Started on Vitamin D 50000 IU weekly and 2000 IU daily

## 2014-08-10 NOTE — Assessment & Plan Note (Signed)
Encouraged good sleep hygiene such as dark, quiet room. No blue/green glowing lights such as computer screens in bedroom. No alcohol or stimulants in evening. Cut down on caffeine as able. Regular exercise is helpful but not just prior to bed time.  

## 2014-08-18 ENCOUNTER — Encounter: Payer: Self-pay | Admitting: Family Medicine

## 2014-08-19 ENCOUNTER — Other Ambulatory Visit: Payer: Self-pay | Admitting: Family Medicine

## 2014-08-19 MED ORDER — ESCITALOPRAM OXALATE 20 MG PO TABS: 20.0000 mg | ORAL_TABLET | Freq: Every day | ORAL | Status: DC

## 2014-09-05 ENCOUNTER — Encounter: Payer: Self-pay | Admitting: Family Medicine

## 2014-09-08 ENCOUNTER — Other Ambulatory Visit: Payer: Self-pay | Admitting: Family Medicine

## 2014-09-08 MED ORDER — ESCITALOPRAM OXALATE 20 MG PO TABS: 20.0000 mg | ORAL_TABLET | Freq: Every day | ORAL | Status: DC

## 2014-09-11 ENCOUNTER — Encounter: Payer: Self-pay | Admitting: Family Medicine

## 2014-09-20 ENCOUNTER — Encounter: Payer: Self-pay | Admitting: Family Medicine

## 2014-09-22 ENCOUNTER — Telehealth: Payer: Self-pay | Admitting: Family Medicine

## 2014-09-22 NOTE — Telephone Encounter (Signed)
°  Relation to GN:FAOZ Call back number:[phone] Pharmacy:  Reason for call: pt has an appt with dr. Charlett Blake on 10/28/14 regarding changing her rx lexapro however she wants to know if she has any alternatives until then because it makes her very sleepy.

## 2014-09-22 NOTE — Telephone Encounter (Signed)
Sure she can try switching to Paroxetine to 20 mg tab, 1 tab po daily, disp #30 with 1 refill til seen

## 2014-09-23 ENCOUNTER — Other Ambulatory Visit: Payer: Self-pay | Admitting: Family Medicine

## 2014-09-23 MED ORDER — PAROXETINE HCL 20 MG PO TABS: 20.0000 mg | ORAL_TABLET | Freq: Every day | ORAL | Status: DC

## 2014-09-23 NOTE — Telephone Encounter (Signed)
Updated medication list per PCP instructions.  D/c lexapro due to causing sleepiness and sent in #30 day with 1 refill of paroxetine 20 mg to local pharmacy per PCP instructions.

## 2014-09-23 NOTE — Telephone Encounter (Signed)
Updated medication list and sent in paroxetine to her local pharmacy.  Called the patient  No answer and no voice mail to leave a message.

## 2014-09-23 NOTE — Telephone Encounter (Signed)
Called the patient informed of change 

## 2014-10-02 ENCOUNTER — Other Ambulatory Visit: Payer: Self-pay | Admitting: Family Medicine

## 2014-10-24 ENCOUNTER — Encounter: Payer: Self-pay | Admitting: Family Medicine

## 2014-10-28 ENCOUNTER — Ambulatory Visit (INDEPENDENT_AMBULATORY_CARE_PROVIDER_SITE_OTHER): Payer: 59 | Admitting: Family Medicine

## 2014-10-28 ENCOUNTER — Encounter: Payer: Self-pay | Admitting: Family Medicine

## 2014-10-28 VITALS — BP 163/95 | HR 73 | Temp 98.9°F | Ht 64.0 in | Wt 185.5 lb

## 2014-10-28 DIAGNOSIS — F418 Other specified anxiety disorders: Secondary | ICD-10-CM

## 2014-10-28 DIAGNOSIS — R011 Cardiac murmur, unspecified: Secondary | ICD-10-CM | POA: Diagnosis not present

## 2014-10-28 DIAGNOSIS — I10 Essential (primary) hypertension: Secondary | ICD-10-CM

## 2014-10-28 DIAGNOSIS — R5383 Other fatigue: Secondary | ICD-10-CM | POA: Diagnosis not present

## 2014-10-28 DIAGNOSIS — F32A Depression, unspecified: Secondary | ICD-10-CM

## 2014-10-28 DIAGNOSIS — M25579 Pain in unspecified ankle and joints of unspecified foot: Secondary | ICD-10-CM | POA: Diagnosis not present

## 2014-10-28 DIAGNOSIS — F329 Major depressive disorder, single episode, unspecified: Secondary | ICD-10-CM

## 2014-10-28 DIAGNOSIS — Z23 Encounter for immunization: Secondary | ICD-10-CM

## 2014-10-28 MED ORDER — VENLAFAXINE HCL ER 75 MG PO CP24: 75.0000 mg | ORAL_CAPSULE | Freq: Every day | ORAL | Status: DC

## 2014-10-28 MED ORDER — VENLAFAXINE HCL ER 37.5 MG PO CP24: 37.5000 mg | ORAL_CAPSULE | Freq: Every day | ORAL | Status: DC

## 2014-10-28 NOTE — Progress Notes (Signed)
Pre visit review using our clinic review tool, if applicable. No additional management support is needed unless otherwise documented below in the visit note. 

## 2014-10-28 NOTE — Patient Instructions (Signed)

## 2014-10-30 ENCOUNTER — Encounter: Payer: Self-pay | Admitting: Family Medicine

## 2014-10-31 ENCOUNTER — Other Ambulatory Visit: Payer: Self-pay | Admitting: Family Medicine

## 2014-10-31 MED ORDER — METOPROLOL SUCCINATE ER 25 MG PO TB24: 25.0000 mg | ORAL_TABLET | Freq: Every day | ORAL | Status: DC

## 2014-11-02 ENCOUNTER — Encounter: Payer: Self-pay | Admitting: Family Medicine

## 2014-11-04 ENCOUNTER — Other Ambulatory Visit: Payer: Self-pay | Admitting: Family Medicine

## 2014-11-04 MED ORDER — ESCITALOPRAM OXALATE 10 MG PO TABS: 10.0000 mg | ORAL_TABLET | Freq: Two times a day (BID) | ORAL | Status: DC

## 2014-11-05 ENCOUNTER — Encounter: Payer: Self-pay | Admitting: Family Medicine

## 2014-11-05 NOTE — Progress Notes (Signed)
Subjective:    Patient ID: Cheryl Taylor, female    DOB: [birth year], 49 y.o.   MRN: [id number]  Chief Complaint  Patient presents with  . Medication Problem    lexapro and paxil    HPI Patient is in today for evaluation of depression. She is struggling with increased fatigue and anhedonia. She does not feel the Paxil is helping and is actually making her sleepy. No suicidal ideation. No new stressors. No acute illness. Denies CP/palp/SOB/HA/congestion/fevers/GI or GU c/o. Taking meds as prescribed  Past Medical History  Diagnosis Date  . Depression   . Anxiety   . Vitamin D deficiency   . HTN (hypertension)   . Alcohol abuse, in remission   . Raynaud's disease   . Hyperlipidemia   . Perimenopausal 11/18/2013  . Obesity 08/10/2014    Past Surgical History  Procedure Laterality Date  . Cesarean section  2003  . Tonsillectomy  1980    Family History  Problem Relation Age of Onset  . Kidney failure Father 42    deceased  . Kidney disease Father   . Obesity Father   . Seizures Father   . Heart disease Father 51  . Schizophrenia Brother     22yo  . Alcohol abuse Brother   . Other Brother     chronic pain, 110 yo  . Alcohol abuse Sister     56yo  . Obesity Paternal Grandfather     Social History   Social History  . Marital Status: Married    Spouse Name: N/A  . Number of Children: N/A  . Years of Education: N/A   Occupational History  . Not on file.   Social History Main Topics  . Smoking status: Never Smoker   . Smokeless tobacco: Never Used  . Alcohol Use: 30.0 oz/week    50 Glasses of wine per week     Comment: 2-3 bottles of wine a night  . Drug Use: No     Comment: Ativan 4-5mg  daily  . Sexual Activity:    Partners: Male    Birth Control/ Protection: Pill   Other Topics Concern  . Not on file   Social History Narrative    Outpatient Prescriptions Prior to Visit  Medication Sig Dispense Refill  . norethindrone (HEATHER) 0.35 MG tablet  Take 1 tablet (0.35 mg total) by mouth daily. 1 Package 0  . valACYclovir (VALTREX) 1000 MG tablet TAKE 1 TABLET DAILY 90 tablet 0  . Vitamin D, Ergocalciferol, (DRISDOL) 50000 UNITS CAPS capsule Take 1 capsule (50,000 Units total) by mouth every 7 (seven) days. 4 capsule 4  . PARoxetine (PAXIL) 20 MG tablet Take 1 tablet (20 mg total) by mouth daily. 30 tablet 1   No facility-administered medications prior to visit.    Allergies  Allergen Reactions  . Lexapro [Escitalopram Oxalate] Other (See Comments)    Makes sleepy    Review of Systems  Constitutional: Positive for malaise/fatigue. Negative for fever.  HENT: Negative for congestion.   Eyes: Negative for discharge.  Respiratory: Negative for shortness of breath.   Cardiovascular: Negative for chest pain, palpitations and leg swelling.  Gastrointestinal: Negative for nausea, abdominal pain and constipation.  Genitourinary: Negative for dysuria.  Musculoskeletal: Negative for falls.  Skin: Negative for rash.  Neurological: Negative for loss of consciousness and headaches.  Endo/Heme/Allergies: Negative for environmental allergies.  Psychiatric/Behavioral: Positive for depression. The patient is nervous/anxious.        Objective:    Physical Exam  Constitutional: She is oriented to person, place, and time. She appears well-developed and well-nourished. No distress.  HENT:  Head: Normocephalic and atraumatic.  Nose: Nose normal.  Eyes: Right eye exhibits no discharge. Left eye exhibits no discharge.  Neck: Normal range of motion. Neck supple.  Cardiovascular: Normal rate and regular rhythm.   No murmur heard. Pulmonary/Chest: Effort normal and breath sounds normal.  Abdominal: Soft. Bowel sounds are normal. There is no tenderness.  Musculoskeletal: She exhibits no edema.  Neurological: She is alert and oriented to person, place, and time.  Skin: Skin is warm and dry.  Psychiatric: She has a normal mood and affect.    Nursing note and vitals reviewed.   BP 163/95 mmHg  Pulse 73  Temp(Src) 98.9 F (37.2 C) (Oral)  Ht 5\' 4"  (1.626 m)  Wt 185 lb 8 oz (84.142 kg)  BMI 31.83 kg/m2  SpO2 98% Wt Readings from Last 3 Encounters:  10/28/14 185 lb 8 oz (84.142 kg)  08/01/14 182 lb 2 oz (82.611 kg)  03/20/14 179 lb 2 oz (81.251 kg)     Lab Results  Component Value Date   WBC 9.3 08/01/2014   HGB 14.1 08/01/2014   HCT 42.1 08/01/2014   PLT 275.0 08/01/2014   GLUCOSE 76 08/01/2014   CHOL 193 08/01/2014   TRIG 146.0 08/01/2014   HDL 42.00 08/01/2014   LDLCALC 122* 08/01/2014   ALT 24 08/01/2014   AST 22 08/01/2014   NA 137 08/01/2014   K 3.7 08/01/2014   CL 103 08/01/2014   CREATININE 0.71 08/01/2014   BUN 13 08/01/2014   CO2 27 08/01/2014   TSH 2.91 08/01/2014    Lab Results  Component Value Date   TSH 2.91 08/01/2014   Lab Results  Component Value Date   WBC 9.3 08/01/2014   HGB 14.1 08/01/2014   HCT 42.1 08/01/2014   MCV 87.6 08/01/2014   PLT 275.0 08/01/2014   Lab Results  Component Value Date   NA 137 08/01/2014   K 3.7 08/01/2014   CO2 27 08/01/2014   GLUCOSE 76 08/01/2014   BUN 13 08/01/2014   CREATININE 0.71 08/01/2014   BILITOT 0.3 08/01/2014   ALKPHOS 57 08/01/2014   AST 22 08/01/2014   ALT 24 08/01/2014   PROT 7.8 08/01/2014   ALBUMIN 4.5 08/01/2014   CALCIUM 9.7 08/01/2014   GFR 92.07 08/01/2014   Lab Results  Component Value Date   CHOL 193 08/01/2014   Lab Results  Component Value Date   HDL 42.00 08/01/2014   Lab Results  Component Value Date   LDLCALC 122* 08/01/2014   Lab Results  Component Value Date   TRIG 146.0 08/01/2014   Lab Results  Component Value Date   CHOLHDL 5 08/01/2014   No results found for: HGBA1C     Assessment & Plan:   Problem List Items Addressed This Visit    HTN (hypertension)    Started on Metoprolol XR 25 mg daily.Encouraged heart healthy diet such as the DASH diet and exercise as tolerated.        Depression    Struggling with anhedonia and fatigue so tried switch to Effexor but this was not tolerated to switched back to Lexapro at adjusted dosing. Referred to behavioral health for further treatment      Relevant Medications   venlafaxine XR (EFFEXOR XR) 75 MG 24 hr capsule   venlafaxine XR (EFFEXOR XR) 37.5 MG 24 hr capsule    Other Visit Diagnoses  Encounter for immunization    -  Primary    Pain in joint, ankle and foot, unspecified laterality        Relevant Orders    Ambulatory referral to Podiatry    Depression with anxiety        Relevant Medications    venlafaxine XR (EFFEXOR XR) 75 MG 24 hr capsule    venlafaxine XR (EFFEXOR XR) 37.5 MG 24 hr capsule    Heart murmur        Relevant Orders    Echocardiogram    Other fatigue        Relevant Orders    TSH    T4, free       I have discontinued Ms. Sofia's PARoxetine. I am also having her start on venlafaxine XR and venlafaxine XR. Additionally, I am having her maintain her norethindrone, Vitamin D (Ergocalciferol), and valACYclovir.  Meds ordered this encounter  Medications  . venlafaxine XR (EFFEXOR XR) 75 MG 24 hr capsule    Sig: Take 1 capsule (75 mg total) by mouth daily with breakfast.    Dispense:  30 capsule    Refill:  2  . venlafaxine XR (EFFEXOR XR) 37.5 MG 24 hr capsule    Sig: Take 1 capsule (37.5 mg total) by mouth daily with breakfast. X 7 days Hold the extra 7 days if you need to stop, can titrate off    Dispense:  14 capsule    Refill:  0     Penni Homans, MD

## 2014-11-05 NOTE — Assessment & Plan Note (Signed)
Struggling with anhedonia and fatigue so tried switch to Effexor but this was not tolerated to switched back to Lexapro at adjusted dosing. Referred to behavioral health for further treatment

## 2014-11-05 NOTE — Assessment & Plan Note (Signed)
Started on Metoprolol XR 25 mg daily.Encouraged heart healthy diet such as the DASH diet and exercise as tolerated.

## 2014-11-12 ENCOUNTER — Other Ambulatory Visit: Payer: 59

## 2014-11-12 ENCOUNTER — Other Ambulatory Visit: Payer: Self-pay | Admitting: Family Medicine

## 2014-11-15 ENCOUNTER — Other Ambulatory Visit: Payer: Self-pay | Admitting: Family Medicine

## 2014-11-19 ENCOUNTER — Other Ambulatory Visit (INDEPENDENT_AMBULATORY_CARE_PROVIDER_SITE_OTHER): Payer: 59

## 2014-11-19 DIAGNOSIS — R5383 Other fatigue: Secondary | ICD-10-CM

## 2014-11-19 LAB — T4, FREE: Free T4: 0.97 ng/dL (ref 0.60–1.60)

## 2014-11-19 LAB — TSH: TSH: 1.85 u[IU]/mL (ref 0.35–4.50)

## 2014-11-19 NOTE — Addendum Note (Signed)
Addended by: Harl Bowie on: 11/19/2014 10:20 AM   Modules accepted: Orders

## 2014-11-23 ENCOUNTER — Encounter: Payer: Self-pay | Admitting: Family Medicine

## 2014-11-25 ENCOUNTER — Other Ambulatory Visit: Payer: Self-pay | Admitting: Family Medicine

## 2014-11-25 MED ORDER — PREDNISONE 20 MG PO TABS: 20.0000 mg | ORAL_TABLET | Freq: Two times a day (BID) | ORAL | Status: DC

## 2014-12-13 ENCOUNTER — Other Ambulatory Visit: Payer: Self-pay | Admitting: Family Medicine

## 2014-12-22 ENCOUNTER — Ambulatory Visit: Payer: Self-pay | Admitting: Family Medicine

## 2015-01-02 ENCOUNTER — Other Ambulatory Visit: Payer: Self-pay | Admitting: Family Medicine

## 2015-01-06 ENCOUNTER — Encounter: Payer: Self-pay | Admitting: Family Medicine

## 2015-01-06 ENCOUNTER — Other Ambulatory Visit: Payer: Self-pay | Admitting: Family Medicine

## 2015-01-06 DIAGNOSIS — G4730 Sleep apnea, unspecified: Secondary | ICD-10-CM

## 2015-01-12 ENCOUNTER — Telehealth: Payer: Self-pay | Admitting: Family Medicine

## 2015-01-12 ENCOUNTER — Encounter: Payer: Self-pay | Admitting: Family Medicine

## 2015-01-12 NOTE — Telephone Encounter (Signed)
-----   Message from Mosie Lukes, MD sent at 01/12/2015  1:05 PM EST ----- Patient bp is up. I advised her to increase her Metoprolol succinate 25 mg tabs to 2 tabs po daily and then come in next month for a bp check, I sent the note through mychart. Just an FYI She may need extra tabs Dr B

## 2015-01-12 NOTE — Telephone Encounter (Signed)
Called the patient left message to call back 

## 2015-01-12 NOTE — Telephone Encounter (Signed)
Patient received message and ok with medications at this time.

## 2015-02-06 ENCOUNTER — Ambulatory Visit (INDEPENDENT_AMBULATORY_CARE_PROVIDER_SITE_OTHER): Payer: 59 | Admitting: Family Medicine

## 2015-02-06 ENCOUNTER — Encounter: Payer: Self-pay | Admitting: Family Medicine

## 2015-02-06 ENCOUNTER — Other Ambulatory Visit (HOSPITAL_COMMUNITY)
Admission: RE | Admit: 2015-02-06 | Discharge: 2015-02-06 | Disposition: A | Discharge status: Home / Self Care | Payer: 59 | Source: Ambulatory Visit | Attending: Family Medicine | Admitting: Family Medicine

## 2015-02-06 VITALS — BP 150/80 | HR 73 | Temp 98.7°F | Ht 64.0 in | Wt 187.5 lb

## 2015-02-06 DIAGNOSIS — Z124 Encounter for screening for malignant neoplasm of cervix: Secondary | ICD-10-CM

## 2015-02-06 DIAGNOSIS — F32A Depression, unspecified: Secondary | ICD-10-CM

## 2015-02-06 DIAGNOSIS — F1010 Alcohol abuse, uncomplicated: Secondary | ICD-10-CM

## 2015-02-06 DIAGNOSIS — Z23 Encounter for immunization: Secondary | ICD-10-CM

## 2015-02-06 DIAGNOSIS — E785 Hyperlipidemia, unspecified: Secondary | ICD-10-CM

## 2015-02-06 DIAGNOSIS — I10 Essential (primary) hypertension: Secondary | ICD-10-CM | POA: Diagnosis not present

## 2015-02-06 DIAGNOSIS — R5383 Other fatigue: Secondary | ICD-10-CM

## 2015-02-06 DIAGNOSIS — E559 Vitamin D deficiency, unspecified: Secondary | ICD-10-CM | POA: Diagnosis not present

## 2015-02-06 DIAGNOSIS — Z01419 Encounter for gynecological examination (general) (routine) without abnormal findings: Secondary | ICD-10-CM | POA: Insufficient documentation

## 2015-02-06 DIAGNOSIS — F1011 Alcohol abuse, in remission: Secondary | ICD-10-CM

## 2015-02-06 DIAGNOSIS — F329 Major depressive disorder, single episode, unspecified: Secondary | ICD-10-CM

## 2015-02-06 HISTORY — DX: Encounter for screening for malignant neoplasm of cervix: Z12.4

## 2015-02-06 MED ORDER — CHLORTHALIDONE 25 MG PO TABS: 25.0000 mg | ORAL_TABLET | Freq: Every day | ORAL | Status: DC

## 2015-02-06 NOTE — Progress Notes (Signed)
Pre visit review using our clinic review tool, if applicable. No additional management support is needed unless otherwise documented below in the visit note. 

## 2015-02-06 NOTE — Assessment & Plan Note (Signed)
Pap today, no concerns on exam.  

## 2015-02-06 NOTE — Patient Instructions (Signed)
[email or web address]   Preventive Care for Adults, Female A healthy lifestyle and preventive care can promote health and wellness. Preventive health guidelines for women include the following key practices.  A routine yearly physical is a good way to check with your health care provider about your health and preventive screening. It is a chance to share any concerns and updates on your health and to receive a thorough exam.  Visit your dentist for a routine exam and preventive care every 6 months. Brush your teeth twice a day and floss once a day. Good oral hygiene prevents tooth decay and gum disease.  The frequency of eye exams is based on your age, health, family medical history, use of contact lenses, and other factors. Follow your health care provider's recommendations for frequency of eye exams.  Eat a healthy diet. Foods like vegetables, fruits, whole grains, low-fat dairy products, and lean protein foods contain the nutrients you need without too many calories. Decrease your intake of foods high in solid fats, added sugars, and salt. Eat the right amount of calories for you.Get information about a proper diet from your health care provider, if necessary.  Regular physical exercise is one of the most important things you can do for your health. Most adults should get at least 150 minutes of moderate-intensity exercise (any activity that increases your heart rate and causes you to sweat) each week. In addition, most adults need muscle-strengthening exercises on 2 or more days a week.  Maintain a healthy weight. The body mass index (BMI) is a screening tool to identify possible weight problems. It provides an estimate of body fat based on height and weight. Your health care provider can find your BMI and can help you achieve or maintain a healthy weight.For adults 20 years and older:  A BMI below 18.5 is considered underweight.  A BMI of 18.5 to 24.9 is normal.  A BMI of 25 to 29.9 is  considered overweight.  A BMI of 30 and above is considered obese.  Maintain normal blood lipids and cholesterol levels by exercising and minimizing your intake of saturated fat. Eat a balanced diet with plenty of fruit and vegetables. Blood tests for lipids and cholesterol should begin at age 1 and be repeated every 5 years. If your lipid or cholesterol levels are high, you are over 50, or you are at high risk for heart disease, you may need your cholesterol levels checked more frequently.Ongoing high lipid and cholesterol levels should be treated with medicines if diet and exercise are not working.  If you smoke, find out from your health care provider how to quit. If you do not use tobacco, do not start.  Lung cancer screening is recommended for adults aged 14-80 years who are at high risk for developing lung cancer because of a history of smoking. A yearly low-dose CT scan of the lungs is recommended for people who have at least a 30-pack-year history of smoking and are a current smoker or have quit within the past 15 years. A pack year of smoking is smoking an average of 1 pack of cigarettes a day for 1 year (for example: 1 pack a day for 30 years or 2 packs a day for 15 years). Yearly screening should continue until the smoker has stopped smoking for at least 15 years. Yearly screening should be stopped for people who develop a health problem that would prevent them from having lung cancer treatment.  If you are pregnant, do not  alcohol. If you are breastfeeding, be very cautious about drinking alcohol. If you are not pregnant and choose to drink alcohol, do not have more than 1 drink per day. One drink is considered to be 12 ounces (355 mL) of beer, 5 ounces (148 mL) of wine, or 1.5 ounces (44 mL) of liquor.  Avoid use of street drugs. Do not share needles with anyone. Ask for help if you need support or instructions about stopping the use of drugs.  High blood pressure causes heart disease and  increases the risk of stroke. Your blood pressure should be checked at least every 1 to 2 years. Ongoing high blood pressure should be treated with medicines if weight loss and exercise do not work.  If you are 55-79 years old, ask your health care provider if you should take aspirin to prevent strokes.  Diabetes screening is done by taking a blood sample to check your blood glucose level after you have not eaten for a certain period of time (fasting). If you are not overweight and you do not have risk factors for diabetes, you should be screened once every 3 years starting at age 45. If you are overweight or obese and you are 40-70 years of age, you should be screened for diabetes every year as part of your cardiovascular risk assessment.  Breast cancer screening is essential preventive care for women. You should practice "breast self-awareness." This means understanding the normal appearance and feel of your breasts and may include breast self-examination. Any changes detected, no matter how small, should be reported to a health care provider. Women in their 20s and 30s should have a clinical breast exam (CBE) by a health care provider as part of a regular health exam every 1 to 3 years. After age 40, women should have a CBE every year. Starting at age 40, women should consider having a mammogram (breast X-ray test) every year. Women who have a family history of breast cancer should talk to their health care provider about genetic screening. Women at a high risk of breast cancer should talk to their health care providers about having an MRI and a mammogram every year.  Breast cancer gene (BRCA)-related cancer risk assessment is recommended for women who have family members with BRCA-related cancers. BRCA-related cancers include breast, ovarian, tubal, and peritoneal cancers. Having family members with these cancers may be associated with an increased risk for harmful changes (mutations) in the breast  cancer genes BRCA1 and BRCA2. Results of the assessment will determine the need for genetic counseling and BRCA1 and BRCA2 testing.  Your health care provider may recommend that you be screened regularly for cancer of the pelvic organs (ovaries, uterus, and vagina). This screening involves a pelvic examination, including checking for microscopic changes to the surface of your cervix (Pap test). You may be encouraged to have this screening done every 3 years, beginning at age 21.  For women ages 30-65, health care providers may recommend pelvic exams and Pap testing every 3 years, or they may recommend the Pap and pelvic exam, combined with testing for human papilloma virus (HPV), every 5 years. Some types of HPV increase your risk of cervical cancer. Testing for HPV may also be done on women of any age with unclear Pap test results.  Other health care providers may not recommend any screening for nonpregnant women who are considered low risk for pelvic cancer and who do not have symptoms. Ask your health care provider if a screening pelvic   exam is right for you.  If you have had past treatment for cervical cancer or a condition that could lead to cancer, you need Pap tests and screening for cancer for at least 20 years after your treatment. If Pap tests have been discontinued, your risk factors (such as having a new sexual partner) need to be reassessed to determine if screening should resume. Some women have medical problems that increase the chance of getting cervical cancer. In these cases, your health care provider may recommend more frequent screening and Pap tests.  Colorectal cancer can be detected and often prevented. Most routine colorectal cancer screening begins at the age of 50 years and continues through age 75 years. However, your health care provider may recommend screening at an earlier age if you have risk factors for colon cancer. On a yearly basis, your health care provider may provide  home test kits to check for hidden blood in the stool. Use of a small camera at the end of a tube, to directly examine the colon (sigmoidoscopy or colonoscopy), can detect the earliest forms of colorectal cancer. Talk to your health care provider about this at age 50, when routine screening begins. Direct exam of the colon should be repeated every 5-10 years through age 75 years, unless early forms of precancerous polyps or small growths are found.  People who are at an increased risk for hepatitis B should be screened for this virus. You are considered at high risk for hepatitis B if:  You were born in a country where hepatitis B occurs often. Talk with your health care provider about which countries are considered high risk.  Your parents were born in a high-risk country and you have not received a shot to protect against hepatitis B (hepatitis B vaccine).  You have HIV or AIDS.  You use needles to inject street drugs.  You live with, or have sex with, someone who has hepatitis B.  You get hemodialysis treatment.  You take certain medicines for conditions like cancer, organ transplantation, and autoimmune conditions.  Hepatitis C blood testing is recommended for all people born from 1945 through 1965 and any individual with known risks for hepatitis C.  Practice safe sex. Use condoms and avoid high-risk sexual practices to reduce the spread of sexually transmitted infections (STIs). STIs include gonorrhea, chlamydia, syphilis, trichomonas, herpes, HPV, and human immunodeficiency virus (HIV). Herpes, HIV, and HPV are viral illnesses that have no cure. They can result in disability, cancer, and death.  You should be screened for sexually transmitted illnesses (STIs) including gonorrhea and chlamydia if:  You are sexually active and are younger than 24 years.  You are older than 24 years and your health care provider tells you that you are at risk for this type of infection.  Your sexual  activity has changed since you were last screened and you are at an increased risk for chlamydia or gonorrhea. Ask your health care provider if you are at risk.  If you are at risk of being infected with HIV, it is recommended that you take a prescription medicine daily to prevent HIV infection. This is called preexposure prophylaxis (PrEP). You are considered at risk if:  You are sexually active and do not regularly use condoms or know the HIV status of your partner(s).  You take drugs by injection.  You are sexually active with a partner who has HIV.  Talk with your health care provider about whether you are at high risk of being   infected with HIV. If you choose to begin PrEP, you should first be tested for HIV. You should then be tested every 3 months for as long as you are taking PrEP.  Osteoporosis is a disease in which the bones lose minerals and strength with aging. This can result in serious bone fractures or breaks. The risk of osteoporosis can be identified using a bone density scan. Women ages 65 years and over and women at risk for fractures or osteoporosis should discuss screening with their health care providers. Ask your health care provider whether you should take a calcium supplement or vitamin D to reduce the rate of osteoporosis.  Menopause can be associated with physical symptoms and risks. Hormone replacement therapy is available to decrease symptoms and risks. You should talk to your health care provider about whether hormone replacement therapy is right for you.  Use sunscreen. Apply sunscreen liberally and repeatedly throughout the day. You should seek shade when your shadow is shorter than you. Protect yourself by wearing long sleeves, pants, a wide-brimmed hat, and sunglasses year round, whenever you are outdoors.  Once a month, do a whole body skin exam, using a mirror to look at the skin on your back. Tell your health care provider of new moles, moles that have irregular  borders, moles that are larger than a pencil eraser, or moles that have changed in shape or color.  Stay current with required vaccines (immunizations).  Influenza vaccine. All adults should be immunized every year.  Tetanus, diphtheria, and acellular pertussis (Td, Tdap) vaccine. Pregnant women should receive 1 dose of Tdap vaccine during each pregnancy. The dose should be obtained regardless of the length of time since the last dose. Immunization is preferred during the 27th-36th week of gestation. An adult who has not previously received Tdap or who does not know her vaccine status should receive 1 dose of Tdap. This initial dose should be followed by tetanus and diphtheria toxoids (Td) booster doses every 10 years. Adults with an unknown or incomplete history of completing a 3-dose immunization series with Td-containing vaccines should begin or complete a primary immunization series including a Tdap dose. Adults should receive a Td booster every 10 years.  Varicella vaccine. An adult without evidence of immunity to varicella should receive 2 doses or a second dose if she has previously received 1 dose. Pregnant females who do not have evidence of immunity should receive the first dose after pregnancy. This first dose should be obtained before leaving the health care facility. The second dose should be obtained 4-8 weeks after the first dose.  Human papillomavirus (HPV) vaccine. Females aged 13-26 years who have not received the vaccine previously should obtain the 3-dose series. The vaccine is not recommended for use in pregnant females. However, pregnancy testing is not needed before receiving a dose. If a female is found to be pregnant after receiving a dose, no treatment is needed. In that case, the remaining doses should be delayed until after the pregnancy. Immunization is recommended for any person with an immunocompromised condition through the age of 26 years if she did not get any or all doses  earlier. During the 3-dose series, the second dose should be obtained 4-8 weeks after the first dose. The third dose should be obtained 24 weeks after the first dose and 16 weeks after the second dose.  Zoster vaccine. One dose is recommended for adults aged 60 years or older unless certain conditions are present.  Measles, mumps, and rubella (  MMR) vaccine. Adults born before 1957 generally are considered immune to measles and mumps. Adults born in 1957 or later should have 1 or more doses of MMR vaccine unless there is a contraindication to the vaccine or there is laboratory evidence of immunity to each of the three diseases. A routine second dose of MMR vaccine should be obtained at least 28 days after the first dose for students attending postsecondary schools, health care workers, or international travelers. People who received inactivated measles vaccine or an unknown type of measles vaccine during 1963-1967 should receive 2 doses of MMR vaccine. People who received inactivated mumps vaccine or an unknown type of mumps vaccine before 1979 and are at high risk for mumps infection should consider immunization with 2 doses of MMR vaccine. For females of childbearing age, rubella immunity should be determined. If there is no evidence of immunity, females who are not pregnant should be vaccinated. If there is no evidence of immunity, females who are pregnant should delay immunization until after pregnancy. Unvaccinated health care workers born before 1957 who lack laboratory evidence of measles, mumps, or rubella immunity or laboratory confirmation of disease should consider measles and mumps immunization with 2 doses of MMR vaccine or rubella immunization with 1 dose of MMR vaccine.  Pneumococcal 13-valent conjugate (PCV13) vaccine. When indicated, a person who is uncertain of his immunization history and has no record of immunization should receive the PCV13 vaccine. All adults 65 years of age and older  should receive this vaccine. An adult aged 19 years or older who has certain medical conditions and has not been previously immunized should receive 1 dose of PCV13 vaccine. This PCV13 should be followed with a dose of pneumococcal polysaccharide (PPSV23) vaccine. Adults who are at high risk for pneumococcal disease should obtain the PPSV23 vaccine at least 8 weeks after the dose of PCV13 vaccine. Adults older than 52 years of age who have normal immune system function should obtain the PPSV23 vaccine dose at least 1 year after the dose of PCV13 vaccine.  Pneumococcal polysaccharide (PPSV23) vaccine. When PCV13 is also indicated, PCV13 should be obtained first. All adults aged 65 years and older should be immunized. An adult younger than age 65 years who has certain medical conditions should be immunized. Any person who resides in a nursing home or long-term care facility should be immunized. An adult smoker should be immunized. People with an immunocompromised condition and certain other conditions should receive both PCV13 and PPSV23 vaccines. People with human immunodeficiency virus (HIV) infection should be immunized as soon as possible after diagnosis. Immunization during chemotherapy or radiation therapy should be avoided. Routine use of PPSV23 vaccine is not recommended for American Indians, [place] Natives, or people younger than 65 years unless there are medical conditions that require PPSV23 vaccine. When indicated, people who have unknown immunization and have no record of immunization should receive PPSV23 vaccine. One-time revaccination 5 years after the first dose of PPSV23 is recommended for people aged 19-64 years who have chronic kidney failure, nephrotic syndrome, asplenia, or immunocompromised conditions. People who received 1-2 doses of PPSV23 before age 65 years should receive another dose of PPSV23 vaccine at age 65 years or later if at least 5 years have passed since the previous dose. Doses  of PPSV23 are not needed for people immunized with PPSV23 at or after age 65 years.  Meningococcal vaccine. Adults with asplenia or persistent complement component deficiencies should receive 2 doses of quadrivalent meningococcal conjugate (MenACWY-D) vaccine. The   doses should be obtained at least 2 months apart. Microbiologists working with certain meningococcal bacteria, military recruits, people at risk during an outbreak, and people who travel to or live in countries with a high rate of meningitis should be immunized. A first-year college student up through age 21 years who is living in a residence hall should receive a dose if she did not receive a dose on or after her 16th birthday. Adults who have certain high-risk conditions should receive one or more doses of vaccine.  Hepatitis A vaccine. Adults who wish to be protected from this disease, have certain high-risk conditions, work with hepatitis A-infected animals, work in hepatitis A research labs, or travel to or work in countries with a high rate of hepatitis A should be immunized. Adults who were previously unvaccinated and who anticipate close contact with an international adoptee during the first 60 days after arrival in the [place] from a country with a high rate of hepatitis A should be immunized.  Hepatitis B vaccine. Adults who wish to be protected from this disease, have certain high-risk conditions, may be exposed to blood or other infectious body fluids, are household contacts or sex partners of hepatitis B positive people, are clients or workers in certain care facilities, or travel to or work in countries with a high rate of hepatitis B should be immunized.  Haemophilus influenzae type b (Hib) vaccine. A previously unvaccinated person with asplenia or sickle cell disease or having a scheduled splenectomy should receive 1 dose of Hib vaccine. Regardless of previous immunization, a recipient of a hematopoietic stem cell transplant  should receive a 3-dose series 6-12 months after her successful transplant. Hib vaccine is not recommended for adults with HIV infection. Preventive Services / Frequency Ages 19 to 39 years  Blood pressure check.** / Every 3-5 years.  Lipid and cholesterol check.** / Every 5 years beginning at age 20.  Clinical breast exam.** / Every 3 years for women in their 20s and 30s.  BRCA-related cancer risk assessment.** / For women who have family members with a BRCA-related cancer (breast, ovarian, tubal, or peritoneal cancers).  Pap test.** / Every 2 years from ages 21 through 29. Every 3 years starting at age 30 through age 65 or 70 with a history of 3 consecutive normal Pap tests.  HPV screening.** / Every 3 years from ages 30 through ages 65 to 70 with a history of 3 consecutive normal Pap tests.  Hepatitis C blood test.** / For any individual with known risks for hepatitis C.  Skin self-exam. / Monthly.  Influenza vaccine. / Every year.  Tetanus, diphtheria, and acellular pertussis (Tdap, Td) vaccine.** / Consult your health care provider. Pregnant women should receive 1 dose of Tdap vaccine during each pregnancy. 1 dose of Td every 10 years.  Varicella vaccine.** / Consult your health care provider. Pregnant females who do not have evidence of immunity should receive the first dose after pregnancy.  HPV vaccine. / 3 doses over 6 months, if 26 and younger. The vaccine is not recommended for use in pregnant females. However, pregnancy testing is not needed before receiving a dose.  Measles, mumps, rubella (MMR) vaccine.** / You need at least 1 dose of MMR if you were born in 1957 or later. You may also need a 2nd dose. For females of childbearing age, rubella immunity should be determined. If there is no evidence of immunity, females who are not pregnant should be vaccinated. If there is no evidence of   immunity, females who are pregnant should delay immunization until after  pregnancy.  Pneumococcal 13-valent conjugate (PCV13) vaccine.** / Consult your health care provider.  Pneumococcal polysaccharide (PPSV23) vaccine.** / 1 to 2 doses if you smoke cigarettes or if you have certain conditions.  Meningococcal vaccine.** / 1 dose if you are age 19 to 21 years and a first-year college student living in a residence hall, or have one of several medical conditions, you need to get vaccinated against meningococcal disease. You may also need additional booster doses.  Hepatitis A vaccine.** / Consult your health care provider.  Hepatitis B vaccine.** / Consult your health care provider.  Haemophilus influenzae type b (Hib) vaccine.** / Consult your health care provider. Ages 40 to 64 years  Blood pressure check.** / Every year.  Lipid and cholesterol check.** / Every 5 years beginning at age 20 years.  Lung cancer screening. / Every year if you are aged 55-80 years and have a 30-pack-year history of smoking and currently smoke or have quit within the past 15 years. Yearly screening is stopped once you have quit smoking for at least 15 years or develop a health problem that would prevent you from having lung cancer treatment.  Clinical breast exam.** / Every year after age 40 years.  BRCA-related cancer risk assessment.** / For women who have family members with a BRCA-related cancer (breast, ovarian, tubal, or peritoneal cancers).  Mammogram.** / Every year beginning at age 40 years and continuing for as long as you are in good health. Consult with your health care provider.  Pap test.** / Every 3 years starting at age 30 years through age 65 or 70 years with a history of 3 consecutive normal Pap tests.  HPV screening.** / Every 3 years from ages 30 years through ages 65 to 70 years with a history of 3 consecutive normal Pap tests.  Fecal occult blood test (FOBT) of stool. / Every year beginning at age 50 years and continuing until age 75 years. You may not need  to do this test if you get a colonoscopy every 10 years.  Flexible sigmoidoscopy or colonoscopy.** / Every 5 years for a flexible sigmoidoscopy or every 10 years for a colonoscopy beginning at age 50 years and continuing until age 75 years.  Hepatitis C blood test.** / For all people born from 1945 through 1965 and any individual with known risks for hepatitis C.  Skin self-exam. / Monthly.  Influenza vaccine. / Every year.  Tetanus, diphtheria, and acellular pertussis (Tdap/Td) vaccine.** / Consult your health care provider. Pregnant women should receive 1 dose of Tdap vaccine during each pregnancy. 1 dose of Td every 10 years.  Varicella vaccine.** / Consult your health care provider. Pregnant females who do not have evidence of immunity should receive the first dose after pregnancy.  Zoster vaccine.** / 1 dose for adults aged 60 years or older.  Measles, mumps, rubella (MMR) vaccine.** / You need at least 1 dose of MMR if you were born in 1957 or later. You may also need a second dose. For females of childbearing age, rubella immunity should be determined. If there is no evidence of immunity, females who are not pregnant should be vaccinated. If there is no evidence of immunity, females who are pregnant should delay immunization until after pregnancy.  Pneumococcal 13-valent conjugate (PCV13) vaccine.** / Consult your health care provider.  Pneumococcal polysaccharide (PPSV23) vaccine.** / 1 to 2 doses if you smoke cigarettes or if you have certain conditions.    Meningococcal vaccine.** / Consult your health care provider.  Hepatitis A vaccine.** / Consult your health care provider.  Hepatitis B vaccine.** / Consult your health care provider.  Haemophilus influenzae type b (Hib) vaccine.** / Consult your health care provider. Ages 65 years and over  Blood pressure check.** / Every year.  Lipid and cholesterol check.** / Every 5 years beginning at age 20 years.  Lung cancer  screening. / Every year if you are aged 55-80 years and have a 30-pack-year history of smoking and currently smoke or have quit within the past 15 years. Yearly screening is stopped once you have quit smoking for at least 15 years or develop a health problem that would prevent you from having lung cancer treatment.  Clinical breast exam.** / Every year after age 40 years.  BRCA-related cancer risk assessment.** / For women who have family members with a BRCA-related cancer (breast, ovarian, tubal, or peritoneal cancers).  Mammogram.** / Every year beginning at age 40 years and continuing for as long as you are in good health. Consult with your health care provider.  Pap test.** / Every 3 years starting at age 30 years through age 65 or 70 years with 3 consecutive normal Pap tests. Testing can be stopped between 65 and 70 years with 3 consecutive normal Pap tests and no abnormal Pap or HPV tests in the past 10 years.  HPV screening.** / Every 3 years from ages 30 years through ages 65 or 70 years with a history of 3 consecutive normal Pap tests. Testing can be stopped between 65 and 70 years with 3 consecutive normal Pap tests and no abnormal Pap or HPV tests in the past 10 years.  Fecal occult blood test (FOBT) of stool. / Every year beginning at age 50 years and continuing until age 75 years. You may not need to do this test if you get a colonoscopy every 10 years.  Flexible sigmoidoscopy or colonoscopy.** / Every 5 years for a flexible sigmoidoscopy or every 10 years for a colonoscopy beginning at age 50 years and continuing until age 75 years.  Hepatitis C blood test.** / For all people born from 1945 through 1965 and any individual with known risks for hepatitis C.  Osteoporosis screening.** / A one-time screening for women ages 65 years and over and women at risk for fractures or osteoporosis.  Skin self-exam. / Monthly.  Influenza vaccine. / Every year.  Tetanus, diphtheria, and  acellular pertussis (Tdap/Td) vaccine.** / 1 dose of Td every 10 years.  Varicella vaccine.** / Consult your health care provider.  Zoster vaccine.** / 1 dose for adults aged 60 years or older.  Pneumococcal 13-valent conjugate (PCV13) vaccine.** / Consult your health care provider.  Pneumococcal polysaccharide (PPSV23) vaccine.** / 1 dose for all adults aged 65 years and older.  Meningococcal vaccine.** / Consult your health care provider.  Hepatitis A vaccine.** / Consult your health care provider.  Hepatitis B vaccine.** / Consult your health care provider.  Haemophilus influenzae type b (Hib) vaccine.** / Consult your health care provider. ** Family history and personal history of risk and conditions may change your health care provider's recommendations.   This information is not intended to replace advice given to you by your health care provider. Make sure you discuss any questions you have with your health care provider.   Document Released: 03/15/2001 Document Revised: 02/07/2014 Document Reviewed: 06/14/2010 Elsevier Interactive Patient Education 2016 Elsevier Inc.  

## 2015-02-10 ENCOUNTER — Encounter: Payer: Self-pay | Admitting: Internal Medicine

## 2015-02-10 ENCOUNTER — Ambulatory Visit (INDEPENDENT_AMBULATORY_CARE_PROVIDER_SITE_OTHER): Payer: 59 | Admitting: Internal Medicine

## 2015-02-10 VITALS — BP 128/92 | HR 65 | Ht 64.0 in | Wt 187.0 lb

## 2015-02-10 DIAGNOSIS — I10 Essential (primary) hypertension: Secondary | ICD-10-CM | POA: Diagnosis not present

## 2015-02-10 DIAGNOSIS — G4733 Obstructive sleep apnea (adult) (pediatric): Secondary | ICD-10-CM | POA: Insufficient documentation

## 2015-02-10 LAB — CYTOLOGY - PAP

## 2015-02-10 NOTE — Patient Instructions (Addendum)
Please see patient coordinator before you leave today  to schedule split night sleep study and I will arrange follow up with a sleep specialist once we have you set up for follow up here   Weight control is simply a matter of calorie balance which needs to be tilted in your favor by eating less and exercising more.  To get the most out of exercise, you need to be continuously aware that you are short of breath, but never out of breath, for 30 minutes daily. As you improve, it will actually be easier for you to do the same amount of exercise  in  30 minutes so always push to the level where you are short of breath.  If this does not result in gradual weight reduction then I strongly recommend you see a nutritionist with a food diary x 2 weeks so that we can work out a negative calorie balance which is universally effective in steady weight loss programs.  Think of your calorie balance like you do your bank account where in this case you want the balance to go down so you must take in less calories than you burn up.  It's just that simple:  Hard to do, but easy to understand.  Good luck!

## 2015-02-10 NOTE — Progress Notes (Signed)
Subjective:    Patient ID: Cheryl Taylor, female    DOB: 08-07-1965, 50 y.o.   MRN: [id number]  HPI  31 yowf never smoker baseline wt around 160 with wt gain x 25 lb gradually since 2014/15 > then onset snoring / hypersomnolence > referred to pulmonary clinic 02/10/2015 by Marzetta Board blythe   02/10/2015 1st [facility] office visit/ Catalena Stanhope   Chief Complaint  Patient presents with  . Sleep Consult    Referred by Dr. Vivien Rossetti. Pt c/o snoring, daytime sleepiness and waking up in the night for the past 1-2 years. She has not had PSG. Epworth score of 19.      Sleep Questionnaire:  What time do you typically go to bed?  10-12   How long does it take you to fall asleep?  1 hour  How many times during the night do you wake up?   2 What time do you get out of bed to start your day?  630an Do you drive or operate heavy machinery in your occupation?   bi  How much has your weight changed (up or down) over the past two years?   Up x 20 m  Have you ever had a sleep study before?   no    If yes, location of study?   n/a   If yes, date of study?   n/q  Do you currently use CPAP?   no If so, what pressure?   N/a    Do you wear oxygen at any time?  no Epworth score   19    No obvious  day to day or daytime variabilty or assoc sob/chronic cough or cp or chest tightness, subjective wheeze overt sinus or hb symptoms. No unusual exp hx or h/o childhood pna/ asthma or knowledge of premature birth.  Sleeping ok without nocturnal  or early am exacerbation  of respiratory  c/o's or need for noct saba. Also denies any obvious fluctuation of symptoms with weather or environmental changes or other aggravating or alleviating factors except as outlined above   Current Medications, Allergies, Complete Past Medical History, Past Surgical History, Family History, and Social History were reviewed in [employer] record.           Review of Systems  Constitutional: Negative for fever,  chills and unexpected weight change.  HENT: Negative for congestion, dental problem, ear pain, nosebleeds, postnasal drip, rhinorrhea, sinus pressure, sneezing, sore throat, trouble swallowing and voice change.   Eyes: Negative for visual disturbance.  Respiratory: Negative for cough, choking and shortness of breath.   Cardiovascular: Negative for chest pain and leg swelling.  Gastrointestinal: Negative for vomiting, abdominal pain and diarrhea.  Genitourinary: Negative for difficulty urinating.  Musculoskeletal: Negative for arthralgias.  Skin: Negative for rash.  Neurological: Negative for tremors, syncope and headaches.  Hematological: Does not bruise/bleed easily.       Objective:   Physical Exam   amb wf nad  Wt Readings from Last 3 Encounters:  02/10/15 187 lb (84.823 kg)  02/06/15 187 lb 8 oz (85.049 kg)  10/28/14 185 lb 8 oz (84.142 kg)    Vital signs reviewed   HEENT: nl dentition, turbinates, and oropharynx. Nl external ear canals without cough reflex Modified Mallampati Score = 1    NECK :  without JVD/Nodes/TM/ nl carotid upstrokes bilaterally   LUNGS: no acc muscle use,  Nl contour chest which is clear to A and P bilaterally without cough on insp or exp  maneuvers   CV:  RRR  no s3 or murmur or increase in P2, no edema   ABD:  soft and nontender with nl inspiratory excursion in the supine position. No bruits or organomegaly, bowel sounds nl  MS:  Nl gait/ ext warm without deformities, calf tenderness, cyanosis or clubbing No obvious joint restrictions   SKIN: warm and dry without lesions    NEURO:  alert, approp, nl sensorium with  no motor deficits          Assessment & Plan:

## 2015-02-10 NOTE — Assessment & Plan Note (Signed)
Complicated by hbp and osa  Body mass index is 32.08    Lab Results  Component Value Date   TSH 1.85 11/19/2014     Contributing to gerd tendency/ doe/reviewed the need and the process to achieve and maintain neg calorie balance > defer f/u primary care including intermittently monitoring thyroid status

## 2015-02-11 NOTE — Assessment & Plan Note (Addendum)
Epworth score 02/10/2015 = 19  Moderately severe hypersomnolence assoc with about 25 lb wt gain/ snoring c/w OSA > rec split night study so she gets trial of cpap and fitted for mask of choice then f/u with sleep medicine.  In meantime strongly rec work on wt  - (see separate a/p)   Total time devoted to counseling  = 25/50m review case with pt/ discussion of options/alternatives/ giving and going over instructions (see avs)

## 2015-02-11 NOTE — Assessment & Plan Note (Signed)
Should improve with wt loss/ cpap>  Follow up per Primary Care planned

## 2015-02-15 ENCOUNTER — Encounter: Payer: Self-pay | Admitting: Family Medicine

## 2015-02-15 DIAGNOSIS — R5383 Other fatigue: Secondary | ICD-10-CM

## 2015-02-15 HISTORY — DX: Other fatigue: R53.83

## 2015-02-15 NOTE — Assessment & Plan Note (Signed)
With restless sleep, extreme fatigue and snoring. Sleep study shows sleep apnea

## 2015-02-15 NOTE — Assessment & Plan Note (Signed)
Continues to abstain 

## 2015-02-15 NOTE — Assessment & Plan Note (Signed)
Improving with Lexapro continue the same

## 2015-02-15 NOTE — Assessment & Plan Note (Signed)
Added Chlorthalidone daily. Poorly controlled will alter medications, encouraged DASH diet, minimize caffeine and obtain adequate sleep. Report concerning symptoms and follow up as directed and as needed

## 2015-02-15 NOTE — Progress Notes (Signed)
Patient ID: Cheryl Taylor, female   DOB: 05-28-1965, 50 y.o.   MRN: [id number]   Subjective:    Patient ID: Cheryl Taylor, female    DOB: 05-21-1965, 50 y.o.   MRN: [id number]  Chief Complaint  Patient presents with  . Annual Exam    HPI Patient is in today for follow-up. She notes that her mood is improved with the switch to Lexapro but she continues to struggle with severe fatigue. She technologist restless sleep and snoring. No recent illness or chills. Denies CP/palp/HA/congestion/fevers/GI or GU c/o. Taking meds as prescribed  Past Medical History  Diagnosis Date  . Depression   . Anxiety   . Vitamin D deficiency   . HTN (hypertension)   . Alcohol abuse, in remission   . Raynaud's disease   . Hyperlipidemia   . Perimenopausal 11/18/2013  . Obesity 08/10/2014  . Screening for cervical cancer 02/06/2015  . Fatigue 02/15/2015    Past Surgical History  Procedure Laterality Date  . Cesarean section  2003  . Tonsillectomy  1980    Family History  Problem Relation Age of Onset  . Kidney failure Father 63    deceased  . Kidney disease Father   . Obesity Father   . Seizures Father   . Heart disease Father 63  . Schizophrenia Brother     57yo  . Alcohol abuse Brother   . Other Brother     chronic pain, 34 yo  . Alcohol abuse Sister     52yo  . Obesity Paternal Grandfather   . Stroke Sister     Social History   Social History  . Marital Status: Married    Spouse Name: N/A  . Number of Children: N/A  . Years of Education: N/A   Occupational History  . Not on file.   Social History Main Topics  . Smoking status: Never Smoker   . Smokeless tobacco: Never Used  . Alcohol Use: No     Comment: quit ETOH on 2013  . Drug Use: No     Comment: Ativan 4-5mg  daily  . Sexual Activity:    Partners: Male    Birth Control/ Protection: Pill     Comment: lives with husband and daughter, works at [employer], no dietary restrictions   Other Topics Concern  . Not on  file   Social History Narrative    Outpatient Prescriptions Prior to Visit  Medication Sig Dispense Refill  . CAMILA 0.35 MG tablet TAKE 1 TABLET BY MOUTH DAILY 28 tablet 4  . valACYclovir (VALTREX) 1000 MG tablet TAKE 1 TABLET DAILY 90 tablet 0  . Vitamin D, Ergocalciferol, (DRISDOL) 50000 UNITS CAPS capsule TAKE 1 CAPSULE (50,000 UNITS TOTAL) BY MOUTH EVERY 7 (SEVEN) DAYS. 4 capsule 4  . escitalopram (LEXAPRO) 10 MG tablet Take 1 tablet (10 mg total) by mouth 2 (two) times daily. 60 tablet 3  . metoprolol succinate (TOPROL-XL) 25 MG 24 hr tablet Take 1 tablet (25 mg total) by mouth daily. 30 tablet 3  . PARoxetine (PAXIL) 20 MG tablet TAKE 1 TABLET (20 MG TOTAL) BY MOUTH DAILY. 30 tablet 1  . predniSONE (DELTASONE) 20 MG tablet Take 1 tablet (20 mg total) by mouth 2 (two) times daily. Take for 5 days 10 tablet 0  . venlafaxine XR (EFFEXOR XR) 37.5 MG 24 hr capsule Take 1 capsule (37.5 mg total) by mouth daily with breakfast. X 7 days Hold the extra 7 days if you need to stop, can titrate  off 14 capsule 0  . venlafaxine XR (EFFEXOR XR) 75 MG 24 hr capsule Take 1 capsule (75 mg total) by mouth daily with breakfast. 30 capsule 2   No facility-administered medications prior to visit.    No Active Allergies  Review of Systems  Constitutional: Positive for malaise/fatigue. Negative for fever.  HENT: Negative for congestion.   Eyes: Negative for discharge.  Respiratory: Negative for shortness of breath.   Cardiovascular: Negative for chest pain, palpitations and leg swelling.  Gastrointestinal: Negative for nausea and abdominal pain.  Genitourinary: Negative for dysuria.  Musculoskeletal: Negative for falls.  Skin: Negative for rash.  Neurological: Negative for loss of consciousness and headaches.  Endo/Heme/Allergies: Negative for environmental allergies.  Psychiatric/Behavioral: Negative for depression. The patient has insomnia. The patient is not nervous/anxious.          Objective:    Physical Exam  Constitutional: She is oriented to person, place, and time. She appears well-developed and well-nourished. No distress.  HENT:  Head: Normocephalic and atraumatic.  Nose: Nose normal.  Eyes: Right eye exhibits no discharge. Left eye exhibits no discharge.  Neck: Normal range of motion. Neck supple.  Cardiovascular: Normal rate and regular rhythm.   No murmur heard. Pulmonary/Chest: Effort normal and breath sounds normal.  Abdominal: Soft. Bowel sounds are normal. There is no tenderness.  Musculoskeletal: She exhibits no edema.  Neurological: She is alert and oriented to person, place, and time.  Skin: Skin is warm and dry.  Psychiatric: She has a normal mood and affect.  Nursing note and vitals reviewed.   BP 150/80 mmHg  Pulse 73  Temp(Src) 98.7 F (37.1 C) (Oral)  Ht 5\' 4"  (1.626 m)  Wt 187 lb 8 oz (85.049 kg)  BMI 32.17 kg/m2  SpO2 98% Wt Readings from Last 3 Encounters:  02/10/15 187 lb (84.823 kg)  02/06/15 187 lb 8 oz (85.049 kg)  10/28/14 185 lb 8 oz (84.142 kg)     Lab Results  Component Value Date   WBC 9.3 08/01/2014   HGB 14.1 08/01/2014   HCT 42.1 08/01/2014   PLT 275.0 08/01/2014   GLUCOSE 76 08/01/2014   CHOL 193 08/01/2014   TRIG 146.0 08/01/2014   HDL 42.00 08/01/2014   LDLCALC 122* 08/01/2014   ALT 24 08/01/2014   AST 22 08/01/2014   NA 137 08/01/2014   K 3.7 08/01/2014   CL 103 08/01/2014   CREATININE 0.71 08/01/2014   BUN 13 08/01/2014   CO2 27 08/01/2014   TSH 1.85 11/19/2014    Lab Results  Component Value Date   TSH 1.85 11/19/2014   Lab Results  Component Value Date   WBC 9.3 08/01/2014   HGB 14.1 08/01/2014   HCT 42.1 08/01/2014   MCV 87.6 08/01/2014   PLT 275.0 08/01/2014   Lab Results  Component Value Date   NA 137 08/01/2014   K 3.7 08/01/2014   CO2 27 08/01/2014   GLUCOSE 76 08/01/2014   BUN 13 08/01/2014   CREATININE 0.71 08/01/2014   BILITOT 0.3 08/01/2014   ALKPHOS 57 08/01/2014    AST 22 08/01/2014   ALT 24 08/01/2014   PROT 7.8 08/01/2014   ALBUMIN 4.5 08/01/2014   CALCIUM 9.7 08/01/2014   GFR 92.07 08/01/2014   Lab Results  Component Value Date   CHOL 193 08/01/2014   Lab Results  Component Value Date   HDL 42.00 08/01/2014   Lab Results  Component Value Date   LDLCALC 122* 08/01/2014  Lab Results  Component Value Date   TRIG 146.0 08/01/2014   Lab Results  Component Value Date   CHOLHDL 5 08/01/2014   No results found for: HGBA1C     Assessment & Plan:   Problem List Items Addressed This Visit      Acute   Alcohol abuse, in remission    Continues to abstain.         Other   Depression    Improving with Lexapro continue the same      Relevant Medications   escitalopram (LEXAPRO) 20 MG tablet   Essential hypertension - Primary    Added Chlorthalidone daily. Poorly controlled will alter medications, encouraged DASH diet, minimize caffeine and obtain adequate sleep. Report concerning symptoms and follow up as directed and as needed      Relevant Medications   metoprolol succinate (TOPROL-XL) 50 MG 24 hr tablet   chlorthalidone (HYGROTON) 25 MG tablet   Fatigue    With restless sleep, extreme fatigue and snoring. Sleep study shows sleep apnea      Hyperlipidemia    Encouraged heart healthy diet, increase exercise, avoid trans fats, consider a krill oil cap daily      Relevant Medications   metoprolol succinate (TOPROL-XL) 50 MG 24 hr tablet   chlorthalidone (HYGROTON) 25 MG tablet   Screening for cervical cancer    Pap today, no concerns on exam.       Relevant Orders   Cytology - PAP (Completed)   Vitamin D deficiency    Labs reveal deficiency. Start on Vitamin D 50000 IU caps, 1 cap po weekly x 12 weeks. Disp #4 with 4 rf. Also take daily Vitamin D over the counter. If already taking a daily supplement increase by 1000 IU daily and if not start Vitamin D 2000 IU daily.        Other Visit Diagnoses    Need for  Tdap vaccination        Relevant Orders    Tdap vaccine greater than or equal to 7yo IM (Completed)       I have discontinued Ms. Mccadden's venlafaxine XR, venlafaxine XR, PARoxetine, and predniSONE. I am also having her start on chlorthalidone. Additionally, I am having her maintain her CAMILA, Vitamin D (Ergocalciferol), valACYclovir, Naproxen-Esomeprazole, metoprolol succinate, and escitalopram.  Meds ordered this encounter  Medications  . Naproxen-Esomeprazole (VIMOVO) 500-20 MG TBEC    Sig: Take 500 mg by mouth 2 (two) times daily.  . metoprolol succinate (TOPROL-XL) 50 MG 24 hr tablet    Sig: Take 50 mg by mouth daily. Take with or immediately following a meal.  . escitalopram (LEXAPRO) 20 MG tablet    Sig: Take 20 mg by mouth daily.  . chlorthalidone (HYGROTON) 25 MG tablet    Sig: Take 1 tablet (25 mg total) by mouth daily.    Dispense:  30 tablet    Refill:  3     Penni Homans, MD

## 2015-02-15 NOTE — Assessment & Plan Note (Signed)
Encouraged heart healthy diet, increase exercise, avoid trans fats, consider a krill oil cap daily 

## 2015-02-15 NOTE — Assessment & Plan Note (Signed)
Labs reveal deficiency. Start on Vitamin D 50000 IU caps, 1 cap po weekly x 12 weeks. Disp #4 with 4 rf. Also take daily Vitamin D over the counter. If already taking a daily supplement increase by 1000 IU daily and if not start Vitamin D 2000 IU daily.  

## 2015-03-02 LAB — COLOGUARD: Cologuard: NEGATIVE

## 2015-03-09 ENCOUNTER — Encounter: Payer: Self-pay | Admitting: Family Medicine

## 2015-03-13 ENCOUNTER — Other Ambulatory Visit: Payer: Self-pay | Admitting: Family Medicine

## 2015-03-13 ENCOUNTER — Encounter: Payer: Self-pay | Admitting: Family Medicine

## 2015-03-13 ENCOUNTER — Other Ambulatory Visit: Payer: Self-pay | Admitting: Internal Medicine

## 2015-03-13 ENCOUNTER — Telehealth: Payer: Self-pay | Admitting: Internal Medicine

## 2015-03-13 DIAGNOSIS — G4733 Obstructive sleep apnea (adult) (pediatric): Secondary | ICD-10-CM

## 2015-03-13 MED ORDER — CHLORTHALIDONE 25 MG PO TABS: 25.0000 mg | ORAL_TABLET | Freq: Every day | ORAL | Status: DC

## 2015-03-13 MED ORDER — ESCITALOPRAM OXALATE 20 MG PO TABS: 20.0000 mg | ORAL_TABLET | Freq: Every day | ORAL | Status: DC

## 2015-03-13 MED ORDER — VALACYCLOVIR HCL 1 G PO TABS: 1000.0000 mg | ORAL_TABLET | Freq: Every day | ORAL | Status: DC

## 2015-03-13 MED ORDER — METOPROLOL SUCCINATE ER 50 MG PO TB24: 50.0000 mg | ORAL_TABLET | Freq: Every day | ORAL | Status: DC

## 2015-03-13 MED ORDER — NORETHINDRONE 0.35 MG PO TABS: 1.0000 | ORAL_TABLET | Freq: Every day | ORAL | Status: DC

## 2015-03-13 NOTE — Telephone Encounter (Signed)
LMTCB x1 for [employer]

## 2015-03-16 ENCOUNTER — Encounter: Payer: Self-pay | Admitting: Family Medicine

## 2015-03-16 ENCOUNTER — Telehealth: Payer: Self-pay | Admitting: Family Medicine

## 2015-03-16 NOTE — Telephone Encounter (Signed)
Received Cologuard Results done on 03/02/2015 Results were negative and did abstract into chart. Patient has been informed by phone of her results. Sent result to scan.

## 2015-03-16 NOTE — Telephone Encounter (Signed)
Left message for Cheryl Taylor at [facility] to return call.

## 2015-03-17 ENCOUNTER — Other Ambulatory Visit: Payer: Self-pay | Admitting: Family Medicine

## 2015-03-17 NOTE — Telephone Encounter (Signed)
LM for [facility] to return call

## 2015-03-18 ENCOUNTER — Encounter: Payer: Self-pay | Admitting: Family Medicine

## 2015-03-18 MED ORDER — CHLORTHALIDONE 25 MG PO TABS: 25.0000 mg | ORAL_TABLET | Freq: Every day | ORAL | Status: DC

## 2015-03-26 ENCOUNTER — Encounter: Payer: Self-pay | Admitting: Family Medicine

## 2015-03-26 ENCOUNTER — Other Ambulatory Visit: Payer: Self-pay | Admitting: Family Medicine

## 2015-03-26 MED ORDER — HYDROXYZINE HCL 25 MG PO TABS: 25.0000 mg | ORAL_TABLET | Freq: Three times a day (TID) | ORAL | Status: DC | PRN

## 2015-03-27 ENCOUNTER — Other Ambulatory Visit: Payer: Self-pay | Admitting: Family Medicine

## 2015-03-27 MED ORDER — PREDNISONE 20 MG PO TABS: 40.0000 mg | ORAL_TABLET | Freq: Every day | ORAL | Status: DC

## 2015-04-01 ENCOUNTER — Encounter (HOSPITAL_BASED_OUTPATIENT_CLINIC_OR_DEPARTMENT_OTHER): Payer: Self-pay

## 2015-04-01 ENCOUNTER — Ambulatory Visit (HOSPITAL_BASED_OUTPATIENT_CLINIC_OR_DEPARTMENT_OTHER): Payer: Managed Care, Other (non HMO) | Attending: Internal Medicine | Admitting: *Deleted

## 2015-04-01 VITALS — Ht 64.0 in | Wt 185.0 lb

## 2015-04-01 DIAGNOSIS — Z79899 Other long term (current) drug therapy: Secondary | ICD-10-CM | POA: Diagnosis not present

## 2015-04-01 DIAGNOSIS — R0683 Snoring: Secondary | ICD-10-CM | POA: Insufficient documentation

## 2015-04-01 DIAGNOSIS — G4733 Obstructive sleep apnea (adult) (pediatric): Secondary | ICD-10-CM | POA: Insufficient documentation

## 2015-04-01 DIAGNOSIS — G4710 Hypersomnia, unspecified: Secondary | ICD-10-CM | POA: Insufficient documentation

## 2015-04-08 ENCOUNTER — Encounter: Payer: Self-pay | Admitting: Family Medicine

## 2015-04-08 DIAGNOSIS — G4710 Hypersomnia, unspecified: Secondary | ICD-10-CM | POA: Diagnosis not present

## 2015-04-08 NOTE — Progress Notes (Signed)
Patient Name: Cheryl Taylor, Cheryl Taylor Date: 04/01/2015 Gender: Female D.O.B: 05/21/1965 Age (years): 50 Referring Provider: Tanda Rockers Height (inches): 46 Interpreting Physician: Chesley Mires MD, ABSM Weight (lbs): 185 RPSGT: Gerhard Perches BMI: 32 MRN: [id number] Neck Size: 14.50  CLINICAL INFORMATION Sleep Study Type: NPSG   Indication for sleep study: daytime hypersomnia.   Epworth Sleepiness Score: 19   SLEEP STUDY TECHNIQUE As per the AASM Manual for the Scoring of Sleep and Associated Events v2.3 (April 2016) with a hypopnea requiring 4% desaturations. The channels recorded and monitored were frontal, central and occipital EEG, electrooculogram (EOG), submentalis EMG (chin), nasal and oral airflow, thoracic and abdominal wall motion, anterior tibialis EMG, snore microphone, electrocardiogram, and pulse oximetry.  MEDICATIONS Patient's medications include: BENADRYL. Medications self-administered by patient during sleep study : No sleep medicine administered.  SLEEP ARCHITECTURE The study was initiated at 11:06:33 PM and ended at 5:10:59 AM. Sleep onset time was 47.4 minutes and the sleep efficiency was 76.6%. The total sleep time was 279.1 minutes. Stage REM latency was 230.5 minutes. The patient spent 7.88% of the night in stage N1 sleep, 62.17% in stage N2 sleep, 19.20% in stage N3 and 10.75% in REM. Alpha intrusion was absent. Supine sleep was 54.70%.  RESPIRATORY PARAMETERS The overall apnea/hypopnea index (AHI) was 3.7 per hour. There were 7 total apneas, including 7 obstructive, 0 central and 0 mixed apneas. There were 10 hypopneas and 18 RERAs.  The respiratory disturbance index (RDI) was 7.5. The AHI during Stage REM sleep was 4.0 per hour. AHI while supine was 2.8 per hour. The mean oxygen saturation was 95.99%. The minimum SpO2 during sleep was 89.00%. Moderate snoring was noted during this study.  CARDIAC DATA The 2 lead EKG demonstrated sinus rhythm. The  mean heart rate was 65.32 beats per minute. Other EKG findings include: None.  LEG MOVEMENT DATA The total PLMS were 4 with a resulting PLMS index of 0.86. Associated arousal with leg movement index was 0.0 .  IMPRESSIONS While she did have several respiratory events, these were not frequent enough to meet diagnosis of obstructive sleep apnea.  Her AHI was 3.7 with an SpO2 low of 89%.  DIAGNOSIS - Hypersomnia (G47.10) - Snoring (R06.83)  RECOMMENDATIONS - Avoid alcohol, sedatives and other CNS depressants that may worsen sleep apnea and disrupt normal sleep architecture. - Sleep hygiene should be reviewed to assess factors that may improve sleep quality. - Weight management and regular exercise should be initiated or continued if appropriate. - Consider repeat in lab sleep study if clinical suspicion for sleep disordered breathing is high.   Chesley Mires, MD, [place], American Board of Sleep Medicine 04/08/2015, 6:04 PM  NPI: [id number]

## 2015-04-09 ENCOUNTER — Other Ambulatory Visit: Payer: Self-pay | Admitting: Family Medicine

## 2015-04-09 MED ORDER — PREDNISONE 20 MG PO TABS: 40.0000 mg | ORAL_TABLET | Freq: Every day | ORAL | Status: DC

## 2015-04-16 ENCOUNTER — Encounter: Payer: Self-pay | Admitting: Family Medicine

## 2015-04-16 ENCOUNTER — Other Ambulatory Visit: Payer: Self-pay | Admitting: Family Medicine

## 2015-04-16 MED ORDER — VITAMIN D (ERGOCALCIFEROL) 1.25 MG (50000 UNIT) PO CAPS: ORAL_CAPSULE | ORAL | Status: DC

## 2015-04-20 ENCOUNTER — Telehealth: Payer: Self-pay | Admitting: Internal Medicine

## 2015-04-20 DIAGNOSIS — G4733 Obstructive sleep apnea (adult) (pediatric): Secondary | ICD-10-CM

## 2015-04-20 NOTE — Telephone Encounter (Signed)
Called and spoke to pt. Pt is requesting the results of sleep study. Pt has appt with Dr. Tennis Must dios on 04/23/15 but is requesting the results prior to appt.   Dr. Melvyn Novas please advise. Thanks.   Patient Instructions     Please see patient coordinator before you leave today to schedule split night sleep study and I will arrange follow up with a sleep specialist once we have you set up for follow up here   Weight control is simply a matter of calorie balance which needs to be tilted in your favor by eating less and exercising more. To get the most out of exercise, you need to be continuously aware that you are short of breath, but never out of breath, for 30 minutes daily. As you improve, it will actually be easier for you to do the same amount of exercise in 30 minutes so always push to the level where you are short of breath. If this does not result in gradual weight reduction then I strongly recommend you see a nutritionist with a food diary x 2 weeks so that we can work out a negative calorie balance which is universally effective in steady weight loss programs.  Think of your calorie balance like you do your bank account where in this case you want the balance to go down so you must take in less calories than you burn up. It's just that simple: Hard to do, but easy to understand. Good luck!

## 2015-04-21 NOTE — Telephone Encounter (Signed)
AHI was 3.7 with an SpO2 low of 89%. This means she does not meed criteria for osa but needs to wait to go over the details with Dr DeDios as there are a number of specific long term recs to help her sleep better

## 2015-04-22 NOTE — Telephone Encounter (Signed)
Spoke with pt and notified of results per Dr. Melvyn Novas. Pt verbalized understanding and denied any questions. Will keep ov with AD tomorrow to discuss further

## 2015-04-23 ENCOUNTER — Ambulatory Visit (INDEPENDENT_AMBULATORY_CARE_PROVIDER_SITE_OTHER): Payer: Managed Care, Other (non HMO) | Admitting: Pulmonary Disease

## 2015-04-23 ENCOUNTER — Encounter: Payer: Self-pay | Admitting: Pulmonary Disease

## 2015-04-23 VITALS — BP 118/72 | HR 67 | Ht 64.0 in | Wt 192.6 lb

## 2015-04-23 DIAGNOSIS — R5383 Other fatigue: Secondary | ICD-10-CM | POA: Diagnosis not present

## 2015-04-23 DIAGNOSIS — F329 Major depressive disorder, single episode, unspecified: Secondary | ICD-10-CM | POA: Diagnosis not present

## 2015-04-23 DIAGNOSIS — G4710 Hypersomnia, unspecified: Secondary | ICD-10-CM | POA: Diagnosis not present

## 2015-04-23 DIAGNOSIS — E785 Hyperlipidemia, unspecified: Secondary | ICD-10-CM

## 2015-04-23 DIAGNOSIS — F32A Depression, unspecified: Secondary | ICD-10-CM

## 2015-04-23 NOTE — Assessment & Plan Note (Signed)
She is depressed but she takes Lexapro which controls her depression. She denies being said.

## 2015-04-23 NOTE — Patient Instructions (Signed)
1. We will schedule you for a home sleep study in April. Please give [place] a call if we don't call you a week or so after the sleep studies done.   Return to clinic in 3 mos.

## 2015-04-23 NOTE — Assessment & Plan Note (Signed)
Recent fatigue. Sleep study was negative. Plan to repeat sleep study. Also has depression but is controlled. Will observe for now. Hopefully, sleep study is (-).

## 2015-04-23 NOTE — Progress Notes (Signed)
Subjective:    Patient ID: Cheryl Taylor, female    DOB: Jul 28, 1965, 50 y.o.   MRN: [id number]  HPI  Patient is here for follow-up after her sleep study. She was seen by MW in January 2017 for hypersomnia. She has gained 20 pounds over the last year. Noticed to have hypersomnia, at work. Interfering with her work. She does not get sleepy driving, drives 10 minutes. Has snoring, gasping, choking. Has witnessed apneas.Sleeps 7 hours per night. Naps during the weekend.  Problem has been going on for 2 years. Initial thought process was from medicines. She denies taking opiates and benzodiazepines.  She is depressed but she takes Lexapro which controls her depression. She denies being said.  She is perimenopausal.    Review of Systems  Constitutional: Negative.   HENT: Negative.   Eyes: Negative.   Respiratory: Negative.   Cardiovascular: Negative.   Gastrointestinal: Negative.   Endocrine: Negative.   Genitourinary: Negative.   Musculoskeletal: Negative.   Allergic/Immunologic: Negative.   Neurological: Negative.   Hematological: Negative.   Psychiatric/Behavioral: Negative.   All other systems reviewed and are negative.  Past Medical History  Diagnosis Date  . Depression   . Anxiety   . Vitamin D deficiency   . HTN (hypertension)   . Alcohol abuse, in remission   . Raynaud's disease   . Hyperlipidemia   . Perimenopausal 11/18/2013  . Obesity 08/10/2014  . Screening for cervical cancer 02/06/2015  . Fatigue 02/15/2015     Family History  Problem Relation Age of Onset  . Kidney failure Father 51    deceased  . Kidney disease Father   . Obesity Father   . Seizures Father   . Heart disease Father 79  . Schizophrenia Brother     30yo  . Alcohol abuse Brother   . Other Brother     chronic pain, 30 yo  . Alcohol abuse Sister     72yo  . Obesity Paternal Grandfather   . Stroke Sister      Past Surgical History  Procedure Laterality Date  . Cesarean section   2003  . Tonsillectomy  1980    Social History   Social History  . Marital Status: Married    Spouse Name: N/A  . Number of Children: N/A  . Years of Education: N/A   Occupational History  . Not on file.   Social History Main Topics  . Smoking status: Never Smoker   . Smokeless tobacco: Never Used  . Alcohol Use: No     Comment: quit ETOH on 2013  . Drug Use: No     Comment: Ativan 4-5mg  daily  . Sexual Activity:    Partners: Male    Birth Control/ Protection: Pill     Comment: lives with husband and daughter, works at [employer], no dietary restrictions   Other Topics Concern  . Not on file   Social History Narrative     No Known Allergies   Outpatient Prescriptions Prior to Visit  Medication Sig Dispense Refill  . chlorthalidone (HYGROTON) 25 MG tablet Take 1 tablet (25 mg total) by mouth daily. 90 tablet 0  . escitalopram (LEXAPRO) 20 MG tablet Take 1 tablet (20 mg total) by mouth daily. 90 tablet 2  . escitalopram (LEXAPRO) 20 MG tablet TAKE 1/2 TABLET (10 MG TOTAL) BY MOUTH 2 (TWO) TIMES DAILY. 30 tablet 3  . metoprolol succinate (TOPROL-XL) 50 MG 24 hr tablet Take 1 tablet (50 mg total) by  mouth daily. Take with or immediately following a meal. 90 tablet 2  . Naproxen-Esomeprazole (VIMOVO) 500-20 MG TBEC Take 500 mg by mouth 2 (two) times daily.    . norethindrone (CAMILA) 0.35 MG tablet Take 1 tablet (0.35 mg total) by mouth daily. 84 tablet 2  . valACYclovir (VALTREX) 1000 MG tablet Take 1 tablet (1,000 mg total) by mouth daily. 90 tablet 2  . Vitamin D, Ergocalciferol, (DRISDOL) 50000 units CAPS capsule TAKE 1 CAPSULE (50,000 UNITS TOTAL) BY MOUTH EVERY 7 (SEVEN) DAYS. 12 capsule 0  . hydrOXYzine (ATARAX/VISTARIL) 25 MG tablet Take 1-2 tablets (25-50 mg total) by mouth 3 (three) times daily as needed for itching. 30 tablet 0  . metoprolol succinate (TOPROL-XL) 25 MG 24 hr tablet TAKE 1 TABLET (25 MG TOTAL) BY MOUTH DAILY. 30 tablet 3  . predniSONE (DELTASONE)  20 MG tablet Take 2 tablets (40 mg total) by mouth daily with breakfast. 10 tablet 0   No facility-administered medications prior to visit.   No orders of the defined types were placed in this encounter.          Objective:   Physical Exam  Vitals:  Filed Vitals:   04/23/15 1448  BP: 118/72  Pulse: 67  Height: 5\' 4"  (1.626 m)  Weight: 192 lb 9.6 oz (87.363 kg)  SpO2: 96%    Constitutional/General:  Pleasant, well-nourished, well-developed, not in any distress,  Comfortably seating.  Well kempt  Body mass index is 33.04 kg/(m^2). Wt Readings from Last 3 Encounters:  04/23/15 192 lb 9.6 oz (87.363 kg)  04/01/15 185 lb (83.915 kg)  02/10/15 187 lb (84.823 kg)      HEENT: Pupils equal and reactive to light and accommodation. Anicteric sclerae. Normal nasal mucosa.   No oral  lesions,  mouth clear,  oropharynx clear, no postnasal drip. (-) Oral thrush. No dental caries.  Airway - Mallampati class III  Neck: No masses. Midline trachea. No JVD, (-) LAD. (-) bruits appreciated.  Respiratory/Chest: Grossly normal chest. (-) deformity. (-) Accessory muscle use.  Symmetric expansion. (-) Tenderness on palpation.  Resonant on percussion.  Diminished BS on both lower lung zones. (-) wheezing, crackles, rhonchi (-) egophony  Cardiovascular: Regular rate and  rhythm, heart sounds normal, no murmur or gallops, no peripheral edema  Gastrointestinal:  Normal bowel sounds. Soft, non-tender. No hepatosplenomegaly.  (-) masses.   Musculoskeletal:  Normal muscle tone. Normal gait.   Extremities: Grossly normal. (-) clubbing, cyanosis.  (-) edema  Skin: (-) rash,lesions seen.   Neurological/Psychiatric : alert, oriented to time, place, person. Normal mood and affect            Assessment & Plan:  Hypersomnia PSG (04/2015)  AHI 4.  She was seen by MW in January 2017 for hypersomnia. She has gained 20 pounds over the last year. Noticed to have hypersomnia, at work.  Interfering with her work. She does not get sleepy driving, drives 10 minutes. Has snoring, gasping, choking. Has witnessed apneas.Sleeps 7 hours per night. Naps during the weekend.  Problem has been going on for 2 years. Initial thought process was from medicines. She denies taking opiates and benzodiazepines.  ESS 12. Hypersomnia affects fxnality.  Plan : 1. Home sleep study again. The lab study was not her best study. She had difficulty falling and staying asleep during that in lab study. 2. If positive, we'll order auto CPAP 5-15 cm water. We'll need a mask fitting session. 3. Anticipate no issues with auto CPAP. 4.  If negative sleep study, I need to see patient to address hypersomnia. May need drug screen. She states she is depressed but is controlled by Lexapro. (-) cataplexy features. (-) Abnormal behavior and sleep. 5. Advised not to drive if sleepy.    Depression She is depressed but she takes Lexapro which controls her depression. She denies being said.  Fatigue Recent fatigue. Sleep study was negative. Plan to repeat sleep study. Also has depression but is controlled. Will observe for now. Hopefully, sleep study is (-).     Monica Becton, MD 04/23/2015, 4:17 PM [facility] Pager [phone] After 3 pm or if no answer, call [phone]

## 2015-04-23 NOTE — Assessment & Plan Note (Addendum)
PSG (04/2015)  AHI 4.  She was seen by MW in January 2017 for hypersomnia. She has gained 20 pounds over the last year. Noticed to have hypersomnia, at work. Interfering with her work. She does not get sleepy driving, drives 10 minutes. Has snoring, gasping, choking. Has witnessed apneas.Sleeps 7 hours per night. Naps during the weekend.  Problem has been going on for 2 years. Initial thought process was from medicines. She denies taking opiates and benzodiazepines.  ESS 12. Hypersomnia affects fxnality.  Plan : 1. Home sleep study again. The lab study was not her best study. She had difficulty falling and staying asleep during that in lab study. 2. If positive, we'll order auto CPAP 5-15 cm water. We'll need a mask fitting session. 3. Anticipate no issues with auto CPAP. 4. If negative sleep study, I need to see patient to address hypersomnia. May need drug screen. She states she is depressed but is controlled by Lexapro. (-) cataplexy features. (-) Abnormal behavior and sleep. 5. Advised not to drive if sleepy.

## 2015-05-07 ENCOUNTER — Encounter: Payer: Self-pay | Admitting: Family Medicine

## 2015-05-07 ENCOUNTER — Ambulatory Visit (INDEPENDENT_AMBULATORY_CARE_PROVIDER_SITE_OTHER): Payer: Managed Care, Other (non HMO) | Admitting: Family Medicine

## 2015-05-07 VITALS — BP 120/78 | HR 90 | Temp 98.4°F | Ht 64.0 in | Wt 191.1 lb

## 2015-05-07 DIAGNOSIS — H547 Unspecified visual loss: Secondary | ICD-10-CM

## 2015-05-07 DIAGNOSIS — F32A Depression, unspecified: Secondary | ICD-10-CM

## 2015-05-07 DIAGNOSIS — G4733 Obstructive sleep apnea (adult) (pediatric): Secondary | ICD-10-CM | POA: Diagnosis not present

## 2015-05-07 DIAGNOSIS — F418 Other specified anxiety disorders: Secondary | ICD-10-CM

## 2015-05-07 DIAGNOSIS — I10 Essential (primary) hypertension: Secondary | ICD-10-CM

## 2015-05-07 DIAGNOSIS — R5383 Other fatigue: Secondary | ICD-10-CM

## 2015-05-07 DIAGNOSIS — H109 Unspecified conjunctivitis: Secondary | ICD-10-CM

## 2015-05-07 DIAGNOSIS — E559 Vitamin D deficiency, unspecified: Secondary | ICD-10-CM | POA: Diagnosis not present

## 2015-05-07 DIAGNOSIS — E785 Hyperlipidemia, unspecified: Secondary | ICD-10-CM | POA: Diagnosis not present

## 2015-05-07 DIAGNOSIS — F419 Anxiety disorder, unspecified: Secondary | ICD-10-CM

## 2015-05-07 DIAGNOSIS — H04123 Dry eye syndrome of bilateral lacrimal glands: Secondary | ICD-10-CM

## 2015-05-07 DIAGNOSIS — F329 Major depressive disorder, single episode, unspecified: Secondary | ICD-10-CM

## 2015-05-07 LAB — COMPREHENSIVE METABOLIC PANEL
ALT: 21 U/L (ref 0–35)
AST: 20 U/L (ref 0–37)
Albumin: 4.7 g/dL (ref 3.5–5.2)
Alkaline Phosphatase: 52 U/L (ref 39–117)
BUN: 19 mg/dL (ref 6–23)
CO2: 31 mEq/L (ref 19–32)
Calcium: 10.1 mg/dL (ref 8.4–10.5)
Chloride: 95 mEq/L — ABNORMAL LOW (ref 96–112)
Creatinine, Ser: 0.8 mg/dL (ref 0.40–1.20)
GFR: 79.99 mL/min (ref >60.00)
Glucose, Bld: 77 mg/dL (ref 70–99)
Potassium: 3.2 mEq/L — ABNORMAL LOW (ref 3.5–5.1)
Sodium: 134 mEq/L — ABNORMAL LOW (ref 135–145)
Total Bilirubin: 0.3 mg/dL (ref 0.2–1.2)
Total Protein: 7.8 g/dL (ref 6.0–8.3)

## 2015-05-07 LAB — CBC
HCT: 40.6 % (ref 36.0–46.0)
Hemoglobin: 13.4 g/dL (ref 12.0–15.0)
MCHC: 33 g/dL (ref 30.0–36.0)
MCV: 86.4 fl (ref 78.0–100.0)
Platelets: 291 10*3/uL (ref 150.0–400.0)
RBC: 4.7 Mil/uL (ref 3.87–5.11)
RDW: 12.9 % (ref 11.5–15.5)
WBC: 12.3 10*3/uL — ABNORMAL HIGH (ref 4.0–10.5)

## 2015-05-07 LAB — VITAMIN D 25 HYDROXY (VIT D DEFICIENCY, FRACTURES): VITD: 63.4 ng/mL (ref 30.00–100.00)

## 2015-05-07 LAB — TSH: TSH: 1.65 u[IU]/mL (ref 0.35–4.50)

## 2015-05-07 NOTE — Progress Notes (Signed)
Pre visit review using our clinic review tool, if applicable. No additional management support is needed unless otherwise documented below in the visit note. 

## 2015-05-07 NOTE — Patient Instructions (Signed)
NOW company 10 strain probiotic    DASH Eating Plan DASH stands for "Dietary Approaches to Stop Hypertension." The DASH eating plan is a healthy eating plan that has been shown to reduce high blood pressure (hypertension). Additional health benefits may include reducing the risk of type 2 diabetes mellitus, heart disease, and stroke. The DASH eating plan may also help with weight loss. WHAT DO I NEED TO KNOW ABOUT THE DASH EATING PLAN? For the DASH eating plan, you will follow these general guidelines:  Choose foods with a percent daily value for sodium of less than 5% (as listed on the food label).  Use salt-free seasonings or herbs instead of table salt or sea salt.  Check with your health care provider or pharmacist before using salt substitutes.  Eat lower-sodium products, often labeled as "lower sodium" or "no salt added."  Eat fresh foods.  Eat more vegetables, fruits, and low-fat dairy products.  Choose whole grains. Look for the word "whole" as the first word in the ingredient list.  Choose fish and skinless chicken or [place] more often than red meat. Limit fish, poultry, and meat to 6 oz (170 g) each day.  Limit sweets, desserts, sugars, and sugary drinks.  Choose heart-healthy fats.  Limit cheese to 1 oz (28 g) per day.  Eat more home-cooked food and less restaurant, buffet, and fast food.  Limit fried foods.  Cook foods using methods other than frying.  Limit canned vegetables. If you do use them, rinse them well to decrease the sodium.  When eating at a restaurant, ask that your food be prepared with less salt, or no salt if possible. WHAT FOODS CAN I EAT? Seek help from a dietitian for individual calorie needs. Grains Whole grain or whole wheat bread. Brown rice. Whole grain or whole wheat pasta. Quinoa, bulgur, and whole grain cereals. Low-sodium cereals. Corn or whole wheat flour tortillas. Whole grain cornbread. Whole grain crackers. Low-sodium  crackers. Vegetables Fresh or frozen vegetables (raw, steamed, roasted, or grilled). Low-sodium or reduced-sodium tomato and vegetable juices. Low-sodium or reduced-sodium tomato sauce and paste. Low-sodium or reduced-sodium canned vegetables.  Fruits All fresh, canned (in natural juice), or frozen fruits. Meat and Other Protein Products Ground beef (85% or leaner), grass-fed beef, or beef trimmed of fat. Skinless chicken or [place]. Ground chicken or [place]. Pork trimmed of fat. All fish and seafood. Eggs. Dried beans, peas, or lentils. Unsalted nuts and seeds. Unsalted canned beans. Dairy Low-fat dairy products, such as skim or 1% milk, 2% or reduced-fat cheeses, low-fat ricotta or cottage cheese, or plain low-fat yogurt. Low-sodium or reduced-sodium cheeses. Fats and Oils Tub margarines without trans fats. Light or reduced-fat mayonnaise and salad dressings (reduced sodium). Avocado. Safflower, olive, or canola oils. Natural peanut or almond butter. Other Unsalted popcorn and pretzels. The items listed above may not be a complete list of recommended foods or beverages. Contact your dietitian for more options. WHAT FOODS ARE NOT RECOMMENDED? Grains White bread. White pasta. White rice. Refined cornbread. Bagels and croissants. Crackers that contain trans fat. Vegetables Creamed or fried vegetables. Vegetables in a cheese sauce. Regular canned vegetables. Regular canned tomato sauce and paste. Regular tomato and vegetable juices. Fruits Dried fruits. Canned fruit in light or heavy syrup. Fruit juice. Meat and Other Protein Products Fatty cuts of meat. Ribs, chicken wings, bacon, sausage, bologna, salami, chitterlings, fatback, hot dogs, bratwurst, and packaged luncheon meats. Salted nuts and seeds. Canned beans with salt. Dairy Whole or 2% milk, cream, half-and-half,  and cream cheese. Whole-fat or sweetened yogurt. Full-fat cheeses or blue cheese. Nondairy creamers and whipped toppings.  Processed cheese, cheese spreads, or cheese curds. Condiments Onion and garlic salt, seasoned salt, table salt, and sea salt. Canned and packaged gravies. Worcestershire sauce. Tartar sauce. Barbecue sauce. Teriyaki sauce. Soy sauce, including reduced sodium. Steak sauce. Fish sauce. Oyster sauce. Cocktail sauce. Horseradish. Ketchup and mustard. Meat flavorings and tenderizers. Bouillon cubes. Hot sauce. Tabasco sauce. Marinades. Taco seasonings. Relishes. Fats and Oils Butter, stick margarine, lard, shortening, ghee, and bacon fat. Coconut, palm kernel, or palm oils. Regular salad dressings. Other Pickles and olives. Salted popcorn and pretzels. The items listed above may not be a complete list of foods and beverages to avoid. Contact your dietitian for more information. WHERE CAN I FIND MORE INFORMATION? National Heart, Lung, and Blood Institute: travelstabloid.com   This information is not intended to replace advice given to you by your health care provider. Make sure you discuss any questions you have with your health care provider.   Document Released: 01/06/2011 Document Revised: 02/07/2014 Document Reviewed: 11/21/2012 Elsevier Interactive Patient Education [employer].

## 2015-05-08 ENCOUNTER — Encounter: Payer: Self-pay | Admitting: Family Medicine

## 2015-05-08 ENCOUNTER — Other Ambulatory Visit: Payer: Self-pay | Admitting: Family Medicine

## 2015-05-08 ENCOUNTER — Telehealth: Payer: Self-pay | Admitting: Family Medicine

## 2015-05-08 DIAGNOSIS — E876 Hypokalemia: Secondary | ICD-10-CM

## 2015-05-08 DIAGNOSIS — E669 Obesity, unspecified: Secondary | ICD-10-CM

## 2015-05-08 MED ORDER — POTASSIUM CHLORIDE CRYS ER 20 MEQ PO TBCR: EXTENDED_RELEASE_TABLET | ORAL | Status: DC

## 2015-05-08 NOTE — Telephone Encounter (Signed)
The patient requested by mychart clarification regarding her lab results done on 05/07/2015. Did inform her of results/instructions.  The patient did agree to all information.  Please see result notes dated 05/07/2015 to refer back to if needed.

## 2015-05-11 ENCOUNTER — Telehealth: Payer: Self-pay | Admitting: Pulmonary Disease

## 2015-05-11 DIAGNOSIS — G4733 Obstructive sleep apnea (adult) (pediatric): Secondary | ICD-10-CM

## 2015-05-11 NOTE — Addendum Note (Signed)
Addended by: Raiford Noble on: 05/11/2015 10:42 AM   Modules accepted: Orders

## 2015-05-11 NOTE — Telephone Encounter (Signed)
   Please call the pt and tell the pt the [place]  showed OSA.  Pt stops breathing 27   times an hour.   Please order autoCPAP 5-15 cm H2O. Patient will need a mask fitting session. Patient will need a 1 month download.   Patient needs to be seen 4-6 weeks after obtaining the cpap machine. Let me know if you receive this.   Thanks!   J. Shirl Harris, MD 05/11/2015, 10:57 PM

## 2015-05-12 NOTE — Telephone Encounter (Signed)
Attempted to contact pt but n/a and no VM. Unable to leave message.

## 2015-05-14 ENCOUNTER — Other Ambulatory Visit: Payer: Self-pay | Admitting: *Deleted

## 2015-05-14 DIAGNOSIS — R5383 Other fatigue: Secondary | ICD-10-CM

## 2015-05-14 DIAGNOSIS — G4710 Hypersomnia, unspecified: Secondary | ICD-10-CM

## 2015-05-19 NOTE — Telephone Encounter (Signed)
Spoke with pt and gave results and recommendations. Pt would like to start CPAP therapy. Orders placed. Pt aware to schedule appt for 4-6 wks after start CPAP therapy. Nothing further needed.

## 2015-05-22 ENCOUNTER — Other Ambulatory Visit: Payer: Self-pay | Admitting: Family Medicine

## 2015-05-22 ENCOUNTER — Encounter: Payer: Self-pay | Admitting: Dietician

## 2015-05-22 ENCOUNTER — Encounter: Payer: Managed Care, Other (non HMO) | Attending: Physician Assistant | Admitting: Dietician

## 2015-05-22 ENCOUNTER — Other Ambulatory Visit (INDEPENDENT_AMBULATORY_CARE_PROVIDER_SITE_OTHER): Payer: Managed Care, Other (non HMO)

## 2015-05-22 VITALS — Ht 64.0 in | Wt 190.0 lb

## 2015-05-22 DIAGNOSIS — E876 Hypokalemia: Secondary | ICD-10-CM

## 2015-05-22 DIAGNOSIS — E669 Obesity, unspecified: Secondary | ICD-10-CM | POA: Diagnosis present

## 2015-05-22 LAB — COMPREHENSIVE METABOLIC PANEL
ALT: 24 U/L (ref 0–35)
AST: 21 U/L (ref 0–37)
Albumin: 4.2 g/dL (ref 3.5–5.2)
Alkaline Phosphatase: 44 U/L (ref 39–117)
BUN: 19 mg/dL (ref 6–23)
CO2: 30 mEq/L (ref 19–32)
Calcium: 9.5 mg/dL (ref 8.4–10.5)
Chloride: 97 mEq/L (ref 96–112)
Creatinine, Ser: 0.79 mg/dL (ref 0.40–1.20)
GFR: 81.14 mL/min (ref >60.00)
Glucose, Bld: 85 mg/dL (ref 70–99)
Potassium: 3.3 mEq/L — ABNORMAL LOW (ref 3.5–5.1)
Sodium: 135 mEq/L (ref 135–145)
Total Bilirubin: 0.3 mg/dL (ref 0.2–1.2)
Total Protein: 7 g/dL (ref 6.0–8.3)

## 2015-05-22 MED ORDER — POTASSIUM CHLORIDE CRYS ER 20 MEQ PO TBCR: 20.0000 meq | EXTENDED_RELEASE_TABLET | Freq: Two times a day (BID) | ORAL | Status: DC

## 2015-05-22 NOTE — Patient Instructions (Addendum)
Aim for 2-3 Carb Choices per meal (30-45 grams) +/- 1 either way  Aim for 0-1 Carbs per snack with a small portion of protein. Include protein in moderation with your meals and snacks Consider  increasing your activity level by walking for 30-60 minutes daily as tolerated  Calorie [employer] or other option instead of eating when not hungry. Mindful eating. Healthy choices, eat slowly stop when satisfied or full.

## 2015-05-22 NOTE — Progress Notes (Signed)
  Medical Nutrition Therapy:  Appt start time: 1330 end time:  1430. (Taken late due to not arriving in [facility].)  Assessment:  Primary concerns today: Patient is here alone.  She is here to try to lose weight.  Her fatigue increased about 2 years ago and would eat when tired or anxious.  Sugar cravings increased over the past 6 months.  She is premenopausal and started on the Lexapro for 2 years with much improvement of depression. She was just diagnosed with sleep apnea and will have an appointment for c-pap soon.  Other hx includes HTN and hyperlipidemia.  She is nervous due to siblings recently having stokes.  Her potassium has been low and she supplements occasionally.  She also has a hx of alcoholism and quitting 4 years ago.   Weight hx: Highest weight 190 lbs (today) UBW 160 lbs for years up until 2 years ago. Lowest adult weight 110 lbs.   Patient lives with her husband and daughter.  Her husband shops and cooks and is very supportive with healthy cooking and choices.  She works for [employer].  Preferred Learning Style:   No preference indicated   Learning Readiness:   Ready  MEDICATIONS: see list   DIETARY INTAKE:  Usual eating pattern includes 3 meals and 2-3 snacks per day.  Everyday foods include increased sweets.  Avoided foods include none.    24-hr recall:  B ( AM): coffee with skim milk, 2 hard boiled eggs, plain oatmeal with water and skim milk.  Snk ( AM): fruit  L (12 PM): frozen meal Snk (2-3 PM): candy bar and cookies D ( PM): milkshake on the way home and then does not want to eat the healthy dinner her husband prepares Snk ( PM): sweets, cereal, carbs "staying up to eat" Beverages: coffee with skim milk, water, milkshake 1 per day  Usual physical activity: likes to walk her dog and garden but has not been exercising much recently.  Estimated energy needs: 1600 calories 90 g protein  Progress Towards Goal(s):  In progress.   Nutritional Diagnosis:   NB-1.1 Food and nutrition-related knowledge deficit As related to balance of energy intake and expenditure.  As evidenced by diet hx and patient report.    Intervention:  Nutrition counseling/education related to mindful eating for normalization of eating habits.  Two changes that she is going to make immediately is eating whole foods and walking the dog each evening after work.  She will be getting a c-pap soon and expect this will help her feel better.  Aim for 2-3 Carb Choices per meal (30-45 grams) +/- 1 either way  Aim for 0-1 Carbs per snack with a small portion of protein. Include protein in moderation with your meals and snacks Consider  increasing your activity level by walking for 30-60 minutes daily as tolerated  Calorie [employer] or other option instead of eating when not hungry. Mindful eating. Healthy choices, eat slowly stop when satisfied or full.  Teaching Method Utilized:  Visual Auditory Hands on  Handouts given during visit include:  Snack list  Meal plan card  Hunger/fullness scale  Barriers to learning/adherence to lifestyle change: none  Demonstrated degree of understanding via:  Teach Back   Monitoring/Evaluation:  Dietary intake, exercise, label reading, and body weight prn.

## 2015-05-24 DIAGNOSIS — H109 Unspecified conjunctivitis: Secondary | ICD-10-CM | POA: Insufficient documentation

## 2015-05-24 DIAGNOSIS — H04123 Dry eye syndrome of bilateral lacrimal glands: Secondary | ICD-10-CM | POA: Insufficient documentation

## 2015-05-24 NOTE — Assessment & Plan Note (Signed)
Encouraged DASH diet, decrease po intake and increase exercise as tolerated. Needs 7-8 hours of sleep nightly. Avoid trans fats, eat small, frequent meals every 4-5 hours with lean proteins, complex carbs and healthy fats. Minimize simple carbs 

## 2015-05-24 NOTE — Assessment & Plan Note (Signed)
Improved with supplements, continue OTC supplements.

## 2015-05-24 NOTE — Assessment & Plan Note (Signed)
Has been started on Xydra by her opthamologist

## 2015-05-24 NOTE — Assessment & Plan Note (Signed)
Doing better at the present time.

## 2015-05-24 NOTE — Progress Notes (Signed)
Patient ID: Cheryl Taylor, female   DOB: [birth year], 50 y.o.   MRN: [id number]   Subjective:    Patient ID: Cheryl Taylor, female    DOB: Aug 07, 1965, 50 y.o.   MRN: [id number]  Chief Complaint  Patient presents with  . Follow-up    HPI Patient is in today for follow up. She is feeling much better, she denies any recent illness or acute complaints. She reports an improvement in her malaise and myalgias with Vitamin D supplements. Denies CP/palp/SOB/HA/congestion/fevers/GI or GU c/o. Taking meds as prescribed  Past Medical History  Diagnosis Date  . Depression   . Anxiety   . Vitamin D deficiency   . HTN (hypertension)   . Alcohol abuse, in remission   . Raynaud's disease   . Hyperlipidemia   . Perimenopausal 11/18/2013  . Obesity 08/10/2014  . Screening for cervical cancer 02/06/2015  . Fatigue 02/15/2015    Past Surgical History  Procedure Laterality Date  . Cesarean section  2003  . Tonsillectomy  1980    Family History  Problem Relation Age of Onset  . Kidney failure Father 50    deceased  . Kidney disease Father   . Obesity Father   . Seizures Father   . Heart disease Father 19  . Schizophrenia Brother     35yo  . Alcohol abuse Brother   . Other Brother     chronic pain, 68 yo  . Alcohol abuse Sister     64yo  . Obesity Paternal Grandfather   . Stroke Sister     Social History   Social History  . Marital Status: Married    Spouse Name: N/A  . Number of Children: N/A  . Years of Education: N/A   Occupational History  . Not on file.   Social History Main Topics  . Smoking status: Never Smoker   . Smokeless tobacco: Never Used  . Alcohol Use: No     Comment: quit ETOH on 2013  . Drug Use: No     Comment: Ativan 4-5mg  daily  . Sexual Activity:    Partners: Male    Birth Control/ Protection: Pill     Comment: lives with husband and daughter, works at [employer], no dietary restrictions   Other Topics Concern  . Not on file   Social History  Narrative    Outpatient Prescriptions Prior to Visit  Medication Sig Dispense Refill  . chlorthalidone (HYGROTON) 25 MG tablet Take 1 tablet (25 mg total) by mouth daily. 90 tablet 0  . escitalopram (LEXAPRO) 20 MG tablet Take 1 tablet (20 mg total) by mouth daily. 90 tablet 2  . metoprolol succinate (TOPROL-XL) 50 MG 24 hr tablet Take 1 tablet (50 mg total) by mouth daily. Take with or immediately following a meal. 90 tablet 2  . Naproxen-Esomeprazole (VIMOVO) 500-20 MG TBEC Take 500 mg by mouth 2 (two) times daily.    . norethindrone (CAMILA) 0.35 MG tablet Take 1 tablet (0.35 mg total) by mouth daily. 84 tablet 2  . valACYclovir (VALTREX) 1000 MG tablet Take 1 tablet (1,000 mg total) by mouth daily. 90 tablet 2  . Vitamin D, Ergocalciferol, (DRISDOL) 50000 units CAPS capsule TAKE 1 CAPSULE (50,000 UNITS TOTAL) BY MOUTH EVERY 7 (SEVEN) DAYS. 12 capsule 0  . escitalopram (LEXAPRO) 20 MG tablet TAKE 1/2 TABLET (10 MG TOTAL) BY MOUTH 2 (TWO) TIMES DAILY. 30 tablet 3   No facility-administered medications prior to visit.    No Known Allergies  Review of Systems  Constitutional: Negative for fever and malaise/fatigue.  HENT: Negative for congestion.   Eyes: Negative for blurred vision.  Respiratory: Negative for shortness of breath.   Cardiovascular: Negative for chest pain, palpitations and leg swelling.  Gastrointestinal: Negative for nausea, abdominal pain and blood in stool.  Genitourinary: Negative for dysuria and frequency.  Musculoskeletal: Negative for falls.  Skin: Negative for rash.  Neurological: Negative for dizziness, loss of consciousness and headaches.  Endo/Heme/Allergies: Negative for environmental allergies.  Psychiatric/Behavioral: Negative for depression. The patient is not nervous/anxious.        Objective:    Physical Exam  Constitutional: She is oriented to person, place, and time. She appears well-developed and well-nourished. No distress.  HENT:  Head:  Normocephalic and atraumatic.  Nose: Nose normal.  Eyes: Right eye exhibits no discharge. Left eye exhibits no discharge.  Neck: Normal range of motion. Neck supple.  Cardiovascular: Normal rate and regular rhythm.   No murmur heard. Pulmonary/Chest: Effort normal and breath sounds normal.  Abdominal: Soft. Bowel sounds are normal. There is no tenderness.  Musculoskeletal: She exhibits no edema.  Neurological: She is alert and oriented to person, place, and time.  Skin: Skin is warm and dry.  Psychiatric: She has a normal mood and affect.  Nursing note and vitals reviewed.   BP 120/78 mmHg  Pulse 90  Temp(Src) 98.4 F (36.9 C) (Oral)  Ht 5\' 4"  (1.626 m)  Wt 191 lb 2 oz (86.694 kg)  BMI 32.79 kg/m2  SpO2 95% Wt Readings from Last 3 Encounters:  05/22/15 190 lb (86.183 kg)  05/07/15 191 lb 2 oz (86.694 kg)  04/23/15 192 lb 9.6 oz (87.363 kg)     Lab Results  Component Value Date   WBC 12.3* 05/07/2015   HGB 13.4 05/07/2015   HCT 40.6 05/07/2015   PLT 291.0 05/07/2015   GLUCOSE 85 05/22/2015   CHOL 193 08/01/2014   TRIG 146.0 08/01/2014   HDL 42.00 08/01/2014   LDLCALC 122* 08/01/2014   ALT 24 05/22/2015   AST 21 05/22/2015   NA 135 05/22/2015   K 3.3* 05/22/2015   CL 97 05/22/2015   CREATININE 0.79 05/22/2015   BUN 19 05/22/2015   CO2 30 05/22/2015   TSH 1.65 05/07/2015    Lab Results  Component Value Date   TSH 1.65 05/07/2015   Lab Results  Component Value Date   WBC 12.3* 05/07/2015   HGB 13.4 05/07/2015   HCT 40.6 05/07/2015   MCV 86.4 05/07/2015   PLT 291.0 05/07/2015   Lab Results  Component Value Date   NA 135 05/22/2015   K 3.3* 05/22/2015   CO2 30 05/22/2015   GLUCOSE 85 05/22/2015   BUN 19 05/22/2015   CREATININE 0.79 05/22/2015   BILITOT 0.3 05/22/2015   ALKPHOS 44 05/22/2015   AST 21 05/22/2015   ALT 24 05/22/2015   PROT 7.0 05/22/2015   ALBUMIN 4.2 05/22/2015   CALCIUM 9.5 05/22/2015   GFR 81.14 05/22/2015   Lab Results    Component Value Date   CHOL 193 08/01/2014   Lab Results  Component Value Date   HDL 42.00 08/01/2014   Lab Results  Component Value Date   LDLCALC 122* 08/01/2014   Lab Results  Component Value Date   TRIG 146.0 08/01/2014   Lab Results  Component Value Date   CHOLHDL 5 08/01/2014   No results found for: HGBA1C     Assessment & Plan:   Problem List  Items Addressed This Visit    Conjunctivitis of left eye   Relevant Orders   TSH (Completed)   CBC (Completed)   Comprehensive metabolic panel (Completed)   VITAMIN D 25 Hydroxy (Vit-D Deficiency, Fractures) (Completed)   Ambulatory referral to Ophthalmology   Depression    Doing better at the present time.       Dry eyes    Has been started on Xydra by her opthamologist      Relevant Orders   TSH (Completed)   CBC (Completed)   Comprehensive metabolic panel (Completed)   VITAMIN D 25 Hydroxy (Vit-D Deficiency, Fractures) (Completed)   Ambulatory referral to Ophthalmology   Essential hypertension    Well controlled, no changes to meds. Encouraged heart healthy diet such as the DASH diet and exercise as tolerated.       Relevant Orders   TSH (Completed)   CBC (Completed)   Comprehensive metabolic panel (Completed)   VITAMIN D 25 Hydroxy (Vit-D Deficiency, Fractures) (Completed)   Fatigue   Relevant Orders   TSH (Completed)   CBC (Completed)   Comprehensive metabolic panel (Completed)   VITAMIN D 25 Hydroxy (Vit-D Deficiency, Fractures) (Completed)   Hyperlipidemia - Primary   Relevant Orders   TSH (Completed)   CBC (Completed)   Comprehensive metabolic panel (Completed)   VITAMIN D 25 Hydroxy (Vit-D Deficiency, Fractures) (Completed)   Morbid obesity ([place])    Encouraged DASH diet, decrease po intake and increase exercise as tolerated. Needs 7-8 hours of sleep nightly. Avoid trans fats, eat small, frequent meals every 4-5 hours with lean proteins, complex carbs and healthy fats. Minimize simple carbs       Relevant Orders   Ambulatory referral to Psychology   Vitamin D deficiency    Improved with supplements, continue OTC supplements.       Relevant Orders   TSH (Completed)   CBC (Completed)   Comprehensive metabolic panel (Completed)   VITAMIN D 25 Hydroxy (Vit-D Deficiency, Fractures) (Completed)    Other Visit Diagnoses    Decreased visual acuity        Relevant Orders    Ambulatory referral to Ophthalmology    Anxiety and depression        Relevant Orders    Ambulatory referral to Psychology       I am having Ms. Sorn maintain her Naproxen-Esomeprazole, escitalopram, metoprolol succinate, valACYclovir, norethindrone, chlorthalidone, and Vitamin D (Ergocalciferol).  No orders of the defined types were placed in this encounter.     Penni Homans, MD

## 2015-05-24 NOTE — Assessment & Plan Note (Signed)
Well controlled, no changes to meds. Encouraged heart healthy diet such as the DASH diet and exercise as tolerated.  °

## 2015-05-25 ENCOUNTER — Encounter: Payer: Self-pay | Admitting: Family Medicine

## 2015-05-25 ENCOUNTER — Other Ambulatory Visit: Payer: Self-pay | Admitting: Family Medicine

## 2015-05-25 MED ORDER — ESCITALOPRAM OXALATE 20 MG PO TABS: 20.0000 mg | ORAL_TABLET | Freq: Every day | ORAL | Status: DC

## 2015-06-05 ENCOUNTER — Encounter: Payer: Self-pay | Admitting: Family Medicine

## 2015-06-08 ENCOUNTER — Other Ambulatory Visit: Payer: Self-pay

## 2015-06-12 ENCOUNTER — Other Ambulatory Visit (INDEPENDENT_AMBULATORY_CARE_PROVIDER_SITE_OTHER): Payer: Managed Care, Other (non HMO)

## 2015-06-12 DIAGNOSIS — E876 Hypokalemia: Secondary | ICD-10-CM

## 2015-06-12 LAB — COMPREHENSIVE METABOLIC PANEL
ALT: 20 U/L (ref 0–35)
AST: 18 U/L (ref 0–37)
Albumin: 4.5 g/dL (ref 3.5–5.2)
Alkaline Phosphatase: 48 U/L (ref 39–117)
BUN: 18 mg/dL (ref 6–23)
CO2: 28 mEq/L (ref 19–32)
Calcium: 9.6 mg/dL (ref 8.4–10.5)
Chloride: 100 mEq/L (ref 96–112)
Creatinine, Ser: 0.73 mg/dL (ref 0.40–1.20)
GFR: 88.87 mL/min (ref >60.00)
Glucose, Bld: 77 mg/dL (ref 70–99)
Potassium: 3.6 mEq/L (ref 3.5–5.1)
Sodium: 136 mEq/L (ref 135–145)
Total Bilirubin: 0.3 mg/dL (ref 0.2–1.2)
Total Protein: 7.2 g/dL (ref 6.0–8.3)

## 2015-06-18 ENCOUNTER — Other Ambulatory Visit: Payer: Self-pay | Admitting: Family Medicine

## 2015-06-18 MED ORDER — ESCITALOPRAM OXALATE 20 MG PO TABS: 20.0000 mg | ORAL_TABLET | Freq: Every day | ORAL | Status: DC

## 2015-07-20 ENCOUNTER — Other Ambulatory Visit: Payer: Self-pay | Admitting: Family Medicine

## 2015-07-20 ENCOUNTER — Encounter: Payer: Self-pay | Admitting: Family Medicine

## 2015-07-20 MED ORDER — NORETHINDRONE 0.35 MG PO TABS: 1.0000 | ORAL_TABLET | Freq: Every day | ORAL | Status: DC

## 2015-09-04 ENCOUNTER — Ambulatory Visit: Payer: Self-pay | Admitting: Family Medicine

## 2015-10-19 ENCOUNTER — Other Ambulatory Visit: Payer: Self-pay | Admitting: Family Medicine

## 2016-01-03 ENCOUNTER — Other Ambulatory Visit: Payer: Self-pay | Admitting: Family Medicine

## 2016-01-11 ENCOUNTER — Other Ambulatory Visit: Payer: Self-pay | Admitting: Family Medicine

## 2016-02-26 ENCOUNTER — Other Ambulatory Visit: Payer: Self-pay | Admitting: Family Medicine

## 2016-04-12 ENCOUNTER — Encounter: Payer: Self-pay | Admitting: Family Medicine

## 2016-04-12 ENCOUNTER — Ambulatory Visit (INDEPENDENT_AMBULATORY_CARE_PROVIDER_SITE_OTHER): Payer: Managed Care, Other (non HMO) | Admitting: Family Medicine

## 2016-04-12 VITALS — BP 124/60 | HR 68 | Temp 98.2°F | Wt 196.2 lb

## 2016-04-12 DIAGNOSIS — E785 Hyperlipidemia, unspecified: Secondary | ICD-10-CM | POA: Diagnosis not present

## 2016-04-12 DIAGNOSIS — R0789 Other chest pain: Secondary | ICD-10-CM | POA: Diagnosis not present

## 2016-04-12 DIAGNOSIS — I10 Essential (primary) hypertension: Secondary | ICD-10-CM

## 2016-04-12 DIAGNOSIS — G4733 Obstructive sleep apnea (adult) (pediatric): Secondary | ICD-10-CM | POA: Diagnosis not present

## 2016-04-12 DIAGNOSIS — R5383 Other fatigue: Secondary | ICD-10-CM

## 2016-04-12 DIAGNOSIS — E559 Vitamin D deficiency, unspecified: Secondary | ICD-10-CM | POA: Diagnosis not present

## 2016-04-12 DIAGNOSIS — F1011 Alcohol abuse, in remission: Secondary | ICD-10-CM

## 2016-04-12 MED ORDER — RANITIDINE HCL 300 MG PO TABS: 300.0000 mg | ORAL_TABLET | Freq: Every day | ORAL | 3 refills | Status: DC

## 2016-04-12 NOTE — Patient Instructions (Signed)
Angina Pectoris Angina pectoris is a very bad feeling in the chest, neck, or arm. Your doctor may call it angina. There are four types of angina. Angina is caused by a lack of blood in the middle and thickest layer of the heart wall (myocardium). Angina may feel like a crushing or squeezing pain in the chest. It may feel like tightness or heavy pressure in the chest. Some people say it feels like gas, heartburn, or indigestion. Some people have symptoms other than pain. These include:  Shortness of breath.  Cold sweats.  Feeling sick to your stomach (nausea).  Feeling light-headed.  Many women have chest discomfort and some of the other symptoms. However, women often have different symptoms, such as:  Feeling tired (fatigue).  Feeling nervous for no reason.  Feeling weak for no reason.  Dizziness or fainting.  Women may have angina without any symptoms. Follow these instructions at home:  Take medicines only as told by your doctor.  Take care of other health issues as told by your doctor. These include: ? High blood pressure (hypertension). ? Diabetes.  Follow a heart-healthy diet. Your doctor can help you to choose healthy food options and make changes.  Talk to your doctor to learn more about healthy cooking methods and use them. These include: ? Roasting. ? Grilling. ? Broiling. ? Baking. ? Poaching. ? Steaming. ? Stir-frying.  Follow an exercise program approved by your doctor.  Keep a healthy weight. Lose weight as told by your doctor.  Rest when you are tired.  Learn to manage stress.  Do not use any tobacco, such as cigarettes, chewing tobacco, or electronic cigarettes. If you need help quitting, ask your doctor.  If you drink alcohol, and your doctor says it is okay, limit yourself to no more than 1 drink per day. One drink equals 12 ounces of beer, 5 ounces of wine, or 1 ounces of hard liquor.  Stop illegal drug use.  Keep all follow-up visits as told  by your doctor. This is important. Do not take these medicines unless your doctor says that you can:  Nonsteroidal anti-inflammatory drugs (NSAIDs). These include: ? Ibuprofen. ? Naproxen. ? Celecoxib.  Vitamin supplements that have vitamin A, vitamin E, or both.  Hormone therapy that contains estrogen with or without progestin.  Get help right away if:  You have pain in your chest, neck, arm, jaw, stomach, or back that: ? Lasts more than a few minutes. ? Comes back. ? Does not get better after you take medicine under your tongue (sublingual nitroglycerin).  You have any of these symptoms for no reason: ? Gas, heartburn, or indigestion. ? Sweating a lot. ? Shortness of breath or trouble breathing. ? Feeling sick to your stomach or throwing up. ? Feeling more tired than usual. ? Feeling nervous or worrying more than usual. ? Feeling weak. ? Diarrhea.  You are suddenly dizzy or light-headed.  You faint or pass out. These symptoms may be an emergency. Do not wait to see if the symptoms will go away. Get medical help right away. Call your local emergency services (911 in the [place] Do not drive yourself to the hospital. This information is not intended to replace advice given to you by your health care provider. Make sure you discuss any questions you have with your health care provider. Document Released: 07/06/2007 Document Revised: 06/25/2015 Document Reviewed: 05/21/2013 Elsevier Interactive Patient Education  2017 Elsevier Inc.  

## 2016-04-12 NOTE — Progress Notes (Signed)
Pre visit review using our clinic review tool, if applicable. No additional management support is needed unless otherwise documented below in the visit note. 

## 2016-04-12 NOTE — Assessment & Plan Note (Signed)
EKG and labs today. No pain today. Likely reflux start Ranitidine and continue Aspirin, referred to cardiology and Echo ordered today. She will present for care if symptoms worsen and do not resolve

## 2016-04-12 NOTE — Assessment & Plan Note (Addendum)
Encouraged DASH diet, decrease po intake and increase exercise as tolerated. Needs 7-8 hours of sleep nightly. Avoid trans fats, eat small, frequent meals every 4-5 hours with lean proteins, complex carbs and healthy fats. Minimize simple carbs Referred to bariatric program

## 2016-04-12 NOTE — Assessment & Plan Note (Signed)
Encouraged heart healthy diet, increase exercise, avoid trans fats, consider a krill oil cap daily 

## 2016-04-12 NOTE — Assessment & Plan Note (Signed)
Well controlled, no changes to meds. Encouraged heart healthy diet such as the DASH diet and exercise as tolerated.  °

## 2016-04-12 NOTE — Assessment & Plan Note (Signed)
Worse lately

## 2016-04-12 NOTE — Assessment & Plan Note (Addendum)
Follows with pulmonology, using CPAP regularly but still wakes up tired.

## 2016-04-12 NOTE — Assessment & Plan Note (Addendum)
Will check level today, encouraged daily supplements, level WNL today.

## 2016-04-12 NOTE — Progress Notes (Signed)
Patient ID: Cheryl Taylor, female   DOB: 1965/07/03, 51 y.o.   MRN: [id number]   Subjective:  I acted as a Education administrator for Penni Homans, [place], [place]   Patient ID: Cheryl Taylor, female    DOB: February 05, 1965, 51 y.o.   MRN: [id number]  Chief Complaint  Patient presents with  . Gastroesophageal Reflux    Gastroesophageal Reflux  She complains of chest pain and heartburn. She reports no coughing. This is a new problem. The current episode started more than 1 month ago. The problem occurs occasionally. The problem has been waxing and waning.    Patient is in today for an acute visit for possible indigestion. States for the past two months she has experienced a burning, stabbing sensation just below her chest. States that these feelings happen when she travels, worse in the evenings and she feels fatigued. She notes the discomfort is all across the top of her chest and there are no associated symptoms such as SOB, nausea, palpitations. No recent febrile illness or hospitalizations. Denies palp/SOB/HA/congestion/fevers/GI or GU c/o. Taking meds as prescribed  Patient Care Team: Mosie Lukes, MD as PCP - General (Family Medicine)   Past Medical History:  Diagnosis Date  . Alcohol abuse, in remission   . Anxiety   . Depression   . Fatigue 02/15/2015  . HTN (hypertension)   . Hyperlipidemia   . Obesity 08/10/2014  . Perimenopausal 11/18/2013  . Raynaud's disease   . Screening for cervical cancer 02/06/2015  . Vitamin D deficiency     Past Surgical History:  Procedure Laterality Date  . CESAREAN SECTION  2003  . TONSILLECTOMY  1980    Family History  Problem Relation Age of Onset  . Kidney failure Father 6    deceased  . Kidney disease Father   . Obesity Father   . Seizures Father   . Heart disease Father 57  . Schizophrenia Brother     42yo  . Alcohol abuse Brother   . Other Brother     chronic pain, 29 yo  . Stroke Brother   . Alcohol abuse Sister     81yo  . Obesity  Paternal Grandfather   . Stroke Sister     Social History   Social History  . Marital status: Married    Spouse name: N/A  . Number of children: N/A  . Years of education: N/A   Occupational History  . Not on file.   Social History Main Topics  . Smoking status: Never Smoker  . Smokeless tobacco: Never Used  . Alcohol use No     Comment: quit ETOH on 2013  . Drug use: No     Comment: Ativan 4-5mg  daily  . Sexual activity: Yes    Partners: Male    Birth control/ protection: Pill     Comment: lives with husband and daughter, works at [employer], no dietary restrictions   Other Topics Concern  . Not on file   Social History Narrative  . No narrative on file    Outpatient Medications Prior to Visit  Medication Sig Dispense Refill  . escitalopram (LEXAPRO) 20 MG tablet TAKE 1 TABLET DAILY 90 tablet 0  . metoprolol succinate (TOPROL-XL) 50 MG 24 hr tablet Take 1 tablet (50 mg total) by mouth daily. Take with or immediately following a meal. 90 tablet 2  . Naproxen-Esomeprazole (VIMOVO) 500-20 MG TBEC Take 500 mg by mouth 2 (two) times daily.    . potassium chloride SA (  K-DUR,KLOR-CON) 20 MEQ tablet Take 1 tablet (20 mEq total) by mouth 2 (two) times daily. 60 tablet 2  . valACYclovir (VALTREX) 1000 MG tablet TAKE 1 TABLET DAILY 90 tablet 2  . chlorthalidone (HYGROTON) 25 MG tablet TAKE 1 TABLET DAILY 90 tablet 0  . norethindrone (CAMILA) 0.35 MG tablet Take 1 tablet (0.35 mg total) by mouth daily. (Patient not taking: Reported on 04/12/2016) 84 tablet 2  . Vitamin D, Ergocalciferol, (DRISDOL) 50000 units CAPS capsule TAKE 1 CAPSULE (50,000 UNITS TOTAL) BY MOUTH EVERY 7 (SEVEN) DAYS. (Patient not taking: Reported on 04/12/2016) 12 capsule 0   No facility-administered medications prior to visit.     No Known Allergies  Review of Systems  Constitutional: Positive for malaise/fatigue. Negative for fever.  HENT: Negative for congestion.   Eyes: Negative for blurred vision.    Respiratory: Negative for cough and shortness of breath.   Cardiovascular: Positive for chest pain. Negative for palpitations and leg swelling.  Gastrointestinal: Positive for heartburn. Negative for vomiting.  Musculoskeletal: Negative for back pain.  Skin: Negative for rash.  Neurological: Negative for loss of consciousness and headaches.       Objective:    Physical Exam  Constitutional: She is oriented to person, place, and time. She appears well-developed and well-nourished. No distress.  HENT:  Head: Normocephalic and atraumatic.  Eyes: Conjunctivae are normal.  Neck: Normal range of motion. No thyromegaly present.  Cardiovascular: Normal rate and regular rhythm.   Murmur heard. Pulmonary/Chest: Effort normal and breath sounds normal. She has no wheezes.  Abdominal: Soft. Bowel sounds are normal. There is no tenderness.  Musculoskeletal: She exhibits no edema or deformity.  Neurological: She is alert and oriented to person, place, and time.  Skin: Skin is warm and dry. She is not diaphoretic.  Psychiatric: She has a normal mood and affect.    BP 124/60 (BP Location: Left Arm, Patient Position: Sitting, Cuff Size: Large)   Pulse 68   Temp 98.2 F (36.8 C) (Oral)   Wt 196 lb 3.2 oz (89 kg)   SpO2 97% Comment: RA  BMI 33.68 kg/m  Wt Readings from Last 3 Encounters:  04/12/16 196 lb 3.2 oz (89 kg)  05/22/15 190 lb (86.2 kg)  05/07/15 191 lb 2 oz (86.7 kg)     Lab Results  Component Value Date   WBC 10.1 04/12/2016   HGB 13.0 04/12/2016   HCT 39.1 04/12/2016   PLT 252.0 04/12/2016   GLUCOSE 114 (H) 04/12/2016   CHOL 218 (H) 04/12/2016   TRIG 295.0 (H) 04/12/2016   HDL 39.00 (L) 04/12/2016   LDLDIRECT 130.0 04/12/2016   LDLCALC 122 (H) 08/01/2014   ALT 36 (H) 04/12/2016   AST 35 04/12/2016   NA 140 04/12/2016   K 3.6 04/12/2016   CL 95 (L) 04/12/2016   CREATININE 0.82 04/12/2016   BUN 17 04/12/2016   CO2 34 (H) 04/12/2016   TSH 1.15 04/12/2016    Lab  Results  Component Value Date   TSH 1.15 04/12/2016   Lab Results  Component Value Date   WBC 10.1 04/12/2016   HGB 13.0 04/12/2016   HCT 39.1 04/12/2016   MCV 83.4 04/12/2016   PLT 252.0 04/12/2016   Lab Results  Component Value Date   NA 140 04/12/2016   K 3.6 04/12/2016   CO2 34 (H) 04/12/2016   GLUCOSE 114 (H) 04/12/2016   BUN 17 04/12/2016   CREATININE 0.82 04/12/2016   BILITOT 0.2 04/12/2016  ALKPHOS 53 04/12/2016   AST 35 04/12/2016   ALT 36 (H) 04/12/2016   PROT 7.4 04/12/2016   ALBUMIN 4.4 04/12/2016   CALCIUM 10.2 04/12/2016   GFR 77.46 04/12/2016   Lab Results  Component Value Date   CHOL 218 (H) 04/12/2016   Lab Results  Component Value Date   HDL 39.00 (L) 04/12/2016   Lab Results  Component Value Date   LDLCALC 122 (H) 08/01/2014   Lab Results  Component Value Date   TRIG 295.0 (H) 04/12/2016   Lab Results  Component Value Date   CHOLHDL 6 04/12/2016   No results found for: HGBA1C     Assessment & Plan:   Problem List Items Addressed This Visit      Acute   Alcohol abuse, in remission    Nearly 5 years in remission.        Other   Vitamin D deficiency    Will check level today, encouraged daily supplements, level WNL today.      Relevant Orders   VITAMIN D 25 Hydroxy (Vit-D Deficiency, Fractures) (Completed)   Essential hypertension    Well controlled, no changes to meds. Encouraged heart healthy diet such as the DASH diet and exercise as tolerated.       Relevant Medications   aspirin 81 MG chewable tablet   Other Relevant Orders   CBC with Differential/Platelet (Completed)   Comprehensive metabolic panel (Completed)   TSH (Completed)   T4, free (Completed)   Atypical chest pain - Primary    EKG and labs today. No pain today. Likely reflux start Ranitidine and continue Aspirin, referred to cardiology and Echo ordered today. She will present for care if symptoms worsen and do not resolve      Relevant Orders   EKG  12-Lead (Completed)   ECHOCARDIOGRAM COMPLETE   Hyperlipidemia    Encouraged heart healthy diet, increase exercise, avoid trans fats, consider a krill oil cap daily      Relevant Medications   aspirin 81 MG chewable tablet   Other Relevant Orders   Lipid panel (Completed)   Morbid obesity ([place])    Encouraged DASH diet, decrease po intake and increase exercise as tolerated. Needs 7-8 hours of sleep nightly. Avoid trans fats, eat small, frequent meals every 4-5 hours with lean proteins, complex carbs and healthy fats. Minimize simple carbs Referred to bariatric program      OSA (obstructive sleep apnea)    Follows with pulmonology, using CPAP regularly but still wakes up tired.       Fatigue    Worse lately      Relevant Orders   T4, free (Completed)      I have discontinued Ms. Steveson's Vitamin D (Ergocalciferol) and norethindrone. I have also changed her ranitidine. Additionally, I am having her maintain her Naproxen-Esomeprazole, metoprolol succinate, potassium chloride SA, valACYclovir, escitalopram, and aspirin.  Meds ordered this encounter  Medications  . aspirin 81 MG chewable tablet    Sig: Chew by mouth daily.  Marland Kitchen DISCONTD: ranitidine (ZANTAC) 300 MG tablet    Sig: Take 300 mg by mouth at bedtime.  . ranitidine (ZANTAC) 300 MG tablet    Sig: Take 1 tablet (300 mg total) by mouth at bedtime.    Dispense:  30 tablet    Refill:  3    CMA served as scribe during this visit. History, Physical and Plan performed by medical provider. Documentation and orders reviewed and attested to.  Penni Homans, MD

## 2016-04-12 NOTE — Assessment & Plan Note (Signed)
Nearly 5 years in remission.

## 2016-04-13 LAB — LIPID PANEL
Cholesterol: 218 mg/dL — ABNORMAL HIGH (ref 0–200)
HDL: 39 mg/dL — ABNORMAL LOW (ref >39.00)
NonHDL: 178.64
Total CHOL/HDL Ratio: 6
Triglycerides: 295 mg/dL — ABNORMAL HIGH (ref 0.0–149.0)
VLDL: 59 mg/dL — ABNORMAL HIGH (ref 0.0–40.0)

## 2016-04-13 LAB — CBC WITH DIFFERENTIAL/PLATELET
Basophils Absolute: 0.1 10*3/uL (ref 0.0–0.1)
Basophils Relative: 1.1 % (ref 0.0–3.0)
Eosinophils Absolute: 0.4 10*3/uL (ref 0.0–0.7)
Eosinophils Relative: 3.8 % (ref 0.0–5.0)
HCT: 39.1 % (ref 36.0–46.0)
Hemoglobin: 13 g/dL (ref 12.0–15.0)
Lymphocytes Relative: 27.6 % (ref 12.0–46.0)
Lymphs Abs: 2.8 10*3/uL (ref 0.7–4.0)
MCHC: 33.3 g/dL (ref 30.0–36.0)
MCV: 83.4 fl (ref 78.0–100.0)
Monocytes Absolute: 0.8 10*3/uL (ref 0.1–1.0)
Monocytes Relative: 8.1 % (ref 3.0–12.0)
Neutro Abs: 6 10*3/uL (ref 1.4–7.7)
Neutrophils Relative %: 59.4 % (ref 43.0–77.0)
Platelets: 252 10*3/uL (ref 150.0–400.0)
RBC: 4.68 Mil/uL (ref 3.87–5.11)
RDW: 13.5 % (ref 11.5–15.5)
WBC: 10.1 10*3/uL (ref 4.0–10.5)

## 2016-04-13 LAB — VITAMIN D 25 HYDROXY (VIT D DEFICIENCY, FRACTURES): VITD: 42.5 ng/mL (ref 30.00–100.00)

## 2016-04-13 LAB — T4, FREE: Free T4: 0.73 ng/dL (ref 0.60–1.60)

## 2016-04-13 LAB — COMPREHENSIVE METABOLIC PANEL
ALT: 36 U/L — ABNORMAL HIGH (ref 0–35)
AST: 35 U/L (ref 0–37)
Albumin: 4.4 g/dL (ref 3.5–5.2)
Alkaline Phosphatase: 53 U/L (ref 39–117)
BUN: 17 mg/dL (ref 6–23)
CO2: 34 mEq/L — ABNORMAL HIGH (ref 19–32)
Calcium: 10.2 mg/dL (ref 8.4–10.5)
Chloride: 95 mEq/L — ABNORMAL LOW (ref 96–112)
Creatinine, Ser: 0.82 mg/dL (ref 0.40–1.20)
GFR: 77.46 mL/min (ref >60.00)
Glucose, Bld: 114 mg/dL — ABNORMAL HIGH (ref 70–99)
Potassium: 3.6 mEq/L (ref 3.5–5.1)
Sodium: 140 mEq/L (ref 135–145)
Total Bilirubin: 0.2 mg/dL (ref 0.2–1.2)
Total Protein: 7.4 g/dL (ref 6.0–8.3)

## 2016-04-13 LAB — LDL CHOLESTEROL, DIRECT: Direct LDL: 130 mg/dL

## 2016-04-13 LAB — TSH: TSH: 1.15 u[IU]/mL (ref 0.35–4.50)

## 2016-04-14 ENCOUNTER — Other Ambulatory Visit: Payer: Self-pay | Admitting: Family Medicine

## 2016-04-18 ENCOUNTER — Ambulatory Visit (HOSPITAL_COMMUNITY)
Admission: RE | Admit: 2016-04-18 | Discharge: 2016-04-18 | Disposition: A | Discharge status: Home / Self Care | Payer: Managed Care, Other (non HMO) | Source: Ambulatory Visit | Attending: Family Medicine | Admitting: Family Medicine

## 2016-04-18 DIAGNOSIS — R0789 Other chest pain: Secondary | ICD-10-CM

## 2016-04-18 NOTE — Progress Notes (Signed)
  Echocardiogram 2D Echocardiogram has been performed.  Almer Littleton L Androw 04/18/2016, 9:56 AM

## 2016-04-21 ENCOUNTER — Encounter (INDEPENDENT_AMBULATORY_CARE_PROVIDER_SITE_OTHER): Payer: Self-pay | Admitting: Family Medicine

## 2016-05-02 ENCOUNTER — Ambulatory Visit (INDEPENDENT_AMBULATORY_CARE_PROVIDER_SITE_OTHER): Payer: Managed Care, Other (non HMO) | Admitting: Family Medicine

## 2016-05-02 ENCOUNTER — Encounter (INDEPENDENT_AMBULATORY_CARE_PROVIDER_SITE_OTHER): Payer: Self-pay | Admitting: Family Medicine

## 2016-05-02 VITALS — BP 122/76 | HR 60 | Temp 98.5°F | Ht 64.0 in | Wt 194.0 lb

## 2016-05-02 DIAGNOSIS — Z0289 Encounter for other administrative examinations: Secondary | ICD-10-CM

## 2016-05-02 DIAGNOSIS — E669 Obesity, unspecified: Secondary | ICD-10-CM | POA: Diagnosis not present

## 2016-05-02 DIAGNOSIS — R5383 Other fatigue: Secondary | ICD-10-CM | POA: Diagnosis not present

## 2016-05-02 DIAGNOSIS — E782 Mixed hyperlipidemia: Secondary | ICD-10-CM | POA: Diagnosis not present

## 2016-05-02 DIAGNOSIS — Z9189 Other specified personal risk factors, not elsewhere classified: Secondary | ICD-10-CM | POA: Diagnosis not present

## 2016-05-02 DIAGNOSIS — Z1389 Encounter for screening for other disorder: Secondary | ICD-10-CM

## 2016-05-02 DIAGNOSIS — Z1331 Encounter for screening for depression: Secondary | ICD-10-CM

## 2016-05-02 DIAGNOSIS — R0602 Shortness of breath: Secondary | ICD-10-CM | POA: Diagnosis not present

## 2016-05-02 DIAGNOSIS — Z6833 Body mass index (BMI) 33.0-33.9, adult: Secondary | ICD-10-CM | POA: Diagnosis not present

## 2016-05-02 DIAGNOSIS — R739 Hyperglycemia, unspecified: Secondary | ICD-10-CM

## 2016-05-02 NOTE — Progress Notes (Signed)
Office: [phone]  /  Fax: [phone]   HPI:   Chief Complaint: OBESITY  Cheryl Taylor (MR# [id number]) is a 51 y.o. female who presents on 05/02/2016 for obesity evaluation and treatment. Current BMI is Body mass index is 33.3 kg/m.Marland Kitchen Cheryl Taylor has struggled with obesity for years and has been unsuccessful in either losing weight or maintaining long term weight loss. Leshay was referred by Dr. Randel Pigg for a consultation and evaluation of her obesity. She attended our information session and states she is currently in the action stage of change and ready to dedicate time achieving and maintaining a healthier weight.  Cheryl Taylor states her family eats meals together she thinks her family will eat healthier with  her she started gaining weight after her drug use (Methamphetamines) 1999 her heaviest weight ever was 196 lbs. she has significant food cravings issues  she snacks frequently in the evenings she frequently makes poor food choices she frequently eats larger portions than normal  she has binge eating behaviors she struggles with emotional eating    Fatigue Cheryl Taylor feels her energy is lower than it should be. This has worsened with weight gain and has not worsened recently. Cheryl Taylor admits to daytime somnolence and  admits to waking up still tired. Patient is at risk for obstructive sleep apnea. Patent has a history of symptoms of daytime fatigue and Epworth sleepiness scale. Patient generally gets 7 or 8 hours of sleep per night, and states they generally have restless. Snoring is present. Apneic episodes are present. Epworth Sleepiness Score is 16  Dyspnea on exertion Cheryl Taylor notes increasing shortness of breath with exercising and seems to be worsening over time with weight gain. She notes getting out of breath sooner with activity than she used to. This has not gotten worse recently.  Hyperglycemia Cheryl Taylor has a history of some elevated blood glucose readings without a  diagnosis of diabetes. Her last fasting glucose was elevated at 114. She admits to polyphagia. Cheryl Taylor denies orthopnea.  Mixed Hyperlipidemia  Cheryl Taylor has hyperlipidemia and has been trying to improve her cholesterol levels with intensive lifestyle modification including a low saturated fat diet, exercise and weight loss. She is not on statin and denies any chest pain, claudication or myalgias. Triglycerides elevated but she is not certain if she was fasting at last lab.  At risk for diabetes Cheryl Taylor is at higher than average risk for developing diabetes due to her obesity, hyperglycemia and hyperlipidemia. She currently denies polyuria or polydipsia.  Depression Screen Cheryl Taylor Food and Mood (modified PHQ-9) score was  Depression screen PHQ 2/9 05/02/2016  Decreased Interest 3  Down, Depressed, Hopeless 3  PHQ - 2 Score 6  Altered sleeping 3  Tired, decreased energy 3  Change in appetite 3  Feeling bad or failure about yourself  1  Trouble concentrating 0  Moving slowly or fidgety/restless 1  Suicidal thoughts 1  PHQ-9 Score 18    ALLERGIES: No Known Allergies  MEDICATIONS: Current Outpatient Prescriptions on File Prior to Visit  Medication Sig Dispense Refill  . aspirin 81 MG chewable tablet Chew by mouth daily.    . chlorthalidone (HYGROTON) 25 MG tablet TAKE 1 TABLET DAILY 90 tablet 0  . escitalopram (LEXAPRO) 20 MG tablet TAKE 1 TABLET DAILY 90 tablet 0  . metoprolol succinate (TOPROL-XL) 50 MG 24 hr tablet Take 1 tablet (50 mg total) by mouth daily. Take with or immediately following a meal. 90 tablet 2  . Naproxen-Esomeprazole (VIMOVO) 500-20 MG TBEC Take 500 mg by  mouth 2 (two) times daily.    . ranitidine (ZANTAC) 300 MG tablet Take 1 tablet (300 mg total) by mouth at bedtime. 30 tablet 3  . valACYclovir (VALTREX) 1000 MG tablet TAKE 1 TABLET DAILY 90 tablet 2  . potassium chloride SA (K-DUR,KLOR-CON) 20 MEQ tablet Take 1 tablet (20 mEq total) by mouth 2 (two) times  daily. (Patient not taking: Reported on 05/02/2016) 60 tablet 2   No current facility-administered medications on file prior to visit.     PAST MEDICAL HISTORY: Past Medical History:  Diagnosis Date  . Alcohol abuse, in remission   . Anger   . Anxiety   . Back pain   . Chest pain   . Depression   . Drug use   . Fatigue 02/15/2015  . GERD (gastroesophageal reflux disease)   . HTN (hypertension)   . Hyperlipidemia   . Obesity 08/10/2014  . Obesity   . Pain in both feet    arthritis / Hallox Rigidus  . Perimenopausal 11/18/2013  . Raynaud's disease   . Screening for cervical cancer 02/06/2015  . Sleep apnea   . SOB (shortness of breath)   . Vitamin D deficiency     PAST SURGICAL HISTORY: Past Surgical History:  Procedure Laterality Date  . CESAREAN SECTION  2003  . TONSILLECTOMY  1980    SOCIAL HISTORY: Social History  Substance Use Topics  . Smoking status: Never Smoker  . Smokeless tobacco: Never Used  . Alcohol use 30.0 oz/week    50 Glasses of wine per week     Comment: quit ETOH on 2013    FAMILY HISTORY: Family History  Problem Relation Age of Onset  . Kidney failure Father 11    deceased  . Kidney disease Father   . Obesity Father   . Seizures Father   . Heart disease Father 25  . Hypertension Father   . Hyperlipidemia Father   . Stroke Father   . Sleep apnea Father   . Schizophrenia Brother     29yo  . Alcohol abuse Brother   . Other Brother     chronic pain, 75 yo  . Stroke Brother   . Alcohol abuse Sister     77yo  . Obesity Paternal Grandfather   . Stroke Sister   . Depression Mother   . Anxiety disorder Mother   . Obesity Mother     ROS: Review of Systems  Constitutional: Positive for malaise/fatigue.       Breasts pain "from being heavy"  Respiratory: Positive for shortness of breath (with exertion).   Cardiovascular: Positive for chest pain (chest pain/discomfort). Negative for orthopnea and claudication.  Gastrointestinal:  Positive for heartburn.  Genitourinary: Negative for frequency.  Musculoskeletal: Positive for back pain and joint pain (feet). Negative for myalgias.  Skin:       dryness  Neurological: Positive for weakness.  Endo/Heme/Allergies: Negative for polydipsia.       Polyphagia Very cold hands or feet  Psychiatric/Behavioral: The patient has insomnia.     PHYSICAL EXAM: Blood pressure 122/76, pulse 60, temperature 98.5 F (36.9 C), temperature source Oral, height 5\' 4"  (1.626 m), weight 194 lb (88 kg), last menstrual period 04/09/2016, SpO2 96 %. Body mass index is 33.3 kg/m. Physical Exam  Constitutional: She is oriented to person, place, and time. She appears well-developed and well-nourished.  Cardiovascular: Normal rate.   Pulmonary/Chest: Effort normal.  Musculoskeletal: Normal range of motion.  Neurological: She is oriented to person, place,  and time.  Skin: Skin is warm and dry.  Psychiatric: She has a normal mood and affect. Her behavior is normal.  Vitals reviewed.   RECENT LABS AND TESTS: BMET    Component Value Date/Time   NA 140 04/12/2016 1558   K 3.6 04/12/2016 1558   CL 95 (L) 04/12/2016 1558   CO2 34 (H) 04/12/2016 1558   GLUCOSE 114 (H) 04/12/2016 1558   BUN 17 04/12/2016 1558   CREATININE 0.82 04/12/2016 1558   CREATININE 0.70 11/07/2012 1009   CALCIUM 10.2 04/12/2016 1558   GFRNONAA >90 07/15/2011 0615   GFRAA >90 07/15/2011 0615   No results found for: HGBA1C No results found for: INSULIN CBC    Component Value Date/Time   WBC 10.1 04/12/2016 1558   RBC 4.68 04/12/2016 1558   HGB 13.0 04/12/2016 1558   HCT 39.1 04/12/2016 1558   PLT 252.0 04/12/2016 1558   MCV 83.4 04/12/2016 1558   MCH 28.3 11/07/2012 1009   MCHC 33.3 04/12/2016 1558   RDW 13.5 04/12/2016 1558   LYMPHSABS 2.8 04/12/2016 1558   MONOABS 0.8 04/12/2016 1558   EOSABS 0.4 04/12/2016 1558   BASOSABS 0.1 04/12/2016 1558   Iron/TIBC/Ferritin/ %Sat No results found for: IRON,  TIBC, FERRITIN, IRONPCTSAT Lipid Panel     Component Value Date/Time   CHOL 218 (H) 04/12/2016 1558   TRIG 295.0 (H) 04/12/2016 1558   HDL 39.00 (L) 04/12/2016 1558   CHOLHDL 6 04/12/2016 1558   VLDL 59.0 (H) 04/12/2016 1558   LDLCALC 122 (H) 08/01/2014 0954   LDLDIRECT 130.0 04/12/2016 1558   Hepatic Function Panel     Component Value Date/Time   PROT 7.4 04/12/2016 1558   ALBUMIN 4.4 04/12/2016 1558   AST 35 04/12/2016 1558   ALT 36 (H) 04/12/2016 1558   ALKPHOS 53 04/12/2016 1558   BILITOT 0.2 04/12/2016 1558   BILIDIR 0.0 11/15/2013 1504      Component Value Date/Time   TSH 1.15 04/12/2016 1558   TSH 1.65 05/07/2015 1123   TSH 1.85 11/19/2014 1030    ECG  shows NSR with a rate of 65 BPM INDIRECT CALORIMETER done today shows a VO2 of 257 and a REE of 1792.    ASSESSMENT AND PLAN: Other fatigue - Plan: Comprehensive metabolic panel, Lipid Panel With LDL/HDL Ratio  SOB (shortness of breath)  Hyperglycemia - Plan: Hemoglobin A1c, Insulin, random  Mixed hyperlipidemia  At risk for diabetes mellitus  Depression screening  Class 1 obesity without serious comorbidity with body mass index (BMI) of 33.0 to 33.9 in adult, unspecified obesity type  PLAN:  Fatigue Cheryl Taylor was informed that her fatigue may be related to obesity, depression or many other causes. Labs will be ordered, and in the meanwhile Shanitra has agreed to work on diet, exercise and weight loss to help with fatigue. Proper sleep hygiene was discussed including the need for 7-8 hours of quality sleep each night. A sleep study was not ordered based on symptoms and Epworth score.  Dyspnea on exertion Cheryl Taylor's shortness of breath appears to be obesity related and exercise induced. She has agreed to work on weight loss and gradually increase exercise to treat her exercise induced shortness of breath. If Cheryl Taylor follows our instructions and loses weight without improvement of her shortness of breath,  we will plan to refer to pulmonology. We will monitor this condition regularly. Cheryl Taylor agrees to this plan.  Hyperglycemia Fasting labs will be obtained and results with be discussed with Cheryl Taylor  in 2 weeks at her follow up visit. In the meanwhile Cheryl Taylor was started on a lower simple carbohydrate diet and will work on weight loss efforts.  Mixed Hyperlipidemia  Cheryl Taylor was informed of the American Heart Association Guidelines emphasizing intensive lifestyle modifications as the first line treatment for hyperlipidemia. We discussed many lifestyle modifications today in depth, and Cheryl Taylor will continue to work on decreasing saturated fats such as fatty red meat, butter and many fried foods. She will also increase vegetables and lean protein in her diet and continue to work on exercise and weight loss efforts. We will re-check labs and follow.  Diabetes risk counselling Cheryl Taylor was given extended (at least 15 minutes) diabetes prevention counseling today. She is 51 y.o. female and has risk factors for diabetes including obesity. We discussed intensive lifestyle modifications today with an emphasis on weight loss as well as increasing exercise and decreasing simple carbohydrates in her diet.  Depression Screen Cheryl Taylor had a strongly positive depression screening. Depression is commonly associated with obesity and often results in emotional eating behaviors. We will monitor this closely and work on CBT to help improve the non-hunger eating patterns. Referral to Psychology may be required if no improvement is seen as she continues in our clinic.  Obesity Consuella is an appropriate candidate for our clinic and I recommend she formally start our obesity treatment program. She is currently in the action stage of change and her goal is to continue with weight loss efforts She has agreed to follow the Category 2 plan +100 calories Anatasia has been instructed to work up to a goal of 150 minutes of  combined cardio and strengthening exercise per week for weight loss and overall health benefits. We discussed the following Behavioral Modification Stratagies today: increasing lean protein intake, increasing lower sugar fruits, work on meal planning and easy cooking plans and emotional eating strategies  Klohe has agreed to follow up with our clinic in 2 weeks. She was informed of the importance of frequent follow up visits to maximize her success with intensive lifestyle modifications for her multiple health conditions. She was informed we would discuss her lab results at her next visit unless there is a critical issue that needs to be addressed sooner. Airika agreed to keep her next visit at the agreed upon time to discuss these results.  I, Doreene Nest, am acting as scribe for Dennard Nip, MD  I have reviewed the above documentation for accuracy and completeness, and I agree with the above. -Dennard Nip, MD

## 2016-05-03 LAB — COMPREHENSIVE METABOLIC PANEL
ALT: 29 IU/L (ref 0–32)
AST: 27 IU/L (ref 0–40)
Albumin/Globulin Ratio: 1.5 (ref 1.2–2.2)
Albumin: 4.3 g/dL (ref 3.5–5.5)
Alkaline Phosphatase: 65 IU/L (ref 39–117)
BUN/Creatinine Ratio: 18 (ref 9–23)
BUN: 14 mg/dL (ref 6–24)
Bilirubin Total: 0.2 mg/dL (ref 0.0–1.2)
CO2: 27 mmol/L (ref 18–29)
Calcium: 9.6 mg/dL (ref 8.7–10.2)
Chloride: 98 mmol/L (ref 96–106)
Creatinine, Ser: 0.78 mg/dL (ref 0.57–1.00)
GFR calc Af Amer: 100 mL/min/{1.73_m2} (ref >59)
GFR calc non Af Amer: 87 mL/min/{1.73_m2} (ref >59)
Globulin, Total: 2.9 g/dL (ref 1.5–4.5)
Glucose: 84 mg/dL (ref 65–99)
Potassium: 3.8 mmol/L (ref 3.5–5.2)
Sodium: 143 mmol/L (ref 134–144)
Total Protein: 7.2 g/dL (ref 6.0–8.5)

## 2016-05-03 LAB — LIPID PANEL WITH LDL/HDL RATIO
Cholesterol, Total: 197 mg/dL (ref 100–199)
HDL: 40 mg/dL (ref >39)
LDL Calculated: 126 mg/dL — ABNORMAL HIGH (ref 0–99)
LDl/HDL Ratio: 3.2 ratio (ref 0.0–3.2)
Triglycerides: 157 mg/dL — ABNORMAL HIGH (ref 0–149)
VLDL Cholesterol Cal: 31 mg/dL (ref 5–40)

## 2016-05-03 LAB — HEMOGLOBIN A1C
Est. average glucose Bld gHb Est-mCnc: 114 mg/dL
Hgb A1c MFr Bld: 5.6 % (ref 4.8–5.6)

## 2016-05-03 LAB — INSULIN, RANDOM: INSULIN: 8.1 u[IU]/mL (ref 2.6–24.9)

## 2016-05-19 ENCOUNTER — Ambulatory Visit (INDEPENDENT_AMBULATORY_CARE_PROVIDER_SITE_OTHER): Payer: Managed Care, Other (non HMO) | Admitting: Family Medicine

## 2016-05-19 VITALS — BP 135/67 | HR 97 | Temp 98.4°F | Ht 64.0 in | Wt 187.0 lb

## 2016-05-19 DIAGNOSIS — Z6832 Body mass index (BMI) 32.0-32.9, adult: Secondary | ICD-10-CM

## 2016-05-19 DIAGNOSIS — E782 Mixed hyperlipidemia: Secondary | ICD-10-CM | POA: Diagnosis not present

## 2016-05-19 DIAGNOSIS — E559 Vitamin D deficiency, unspecified: Secondary | ICD-10-CM | POA: Diagnosis not present

## 2016-05-19 DIAGNOSIS — Z9189 Other specified personal risk factors, not elsewhere classified: Secondary | ICD-10-CM

## 2016-05-19 DIAGNOSIS — E66811 Obesity, class 1: Secondary | ICD-10-CM | POA: Insufficient documentation

## 2016-05-19 DIAGNOSIS — E8881 Metabolic syndrome: Secondary | ICD-10-CM | POA: Diagnosis not present

## 2016-05-19 DIAGNOSIS — E669 Obesity, unspecified: Secondary | ICD-10-CM | POA: Insufficient documentation

## 2016-05-19 NOTE — Progress Notes (Signed)
Office: [phone]  /  Fax: [phone]   HPI:   Chief Complaint: OBESITY Cheryl Taylor is here to discuss her progress with her obesity treatment plan. She is following her eating plan approximately 100 % of the time and states she is exercising 0 minutes 0 times per week. Cheryl Taylor has done well with weight loss on category 2 plan, hunger was mostly controlled. She did well planning meals ahead and she had good support from her husband. Her weight is 187 lb (84.8 kg) today and has had a weight loss of 7 pounds over a period of 2 weeks since her last visit. She has lost 7 lbs since starting treatment with [place].  Vitamin D deficiency Cheryl Taylor has a diagnosis of vitamin D deficiency. She is currently taking OTC vit D, last level almost at goal, fatigue is stable and denies nausea, vomiting or muscle weakness.  Mixed Hyperlipidemia Cheryl Taylor has mixed hyperlipidemia, LDL and triglycerides are elevated and HDL is low. She has not been following a low cholesterol diet previously before our first visit. She is doing well on her eating plan in the last 2 weeks and has been trying to improve her cholesterol levels with intensive lifestyle modification including a low saturated fat diet, exercise and weight loss. She denies any chest pain, claudication or myalgias.  Insulin Resistance Cheryl Taylor has a diagnosis of insulin resistance based on her slightly elevated fasting insulin level at 8 and Hgb A1c very slightly elevated at 5.6 Although Cheryl Taylor's blood glucose readings are within normal limits, insulin resistance puts her at greater risk of metabolic syndrome and diabetes. She is not taking metformin currently and continues to work on diet and exercise to decrease risk of diabetes. Cheryl Taylor notes mild polyphagia and denies hypoglycemia.  At risk for diabetes Cheryl Taylor is at higher than average risk for developing diabetes due to her obesity. She currently denies polyuria or polydipsia.  Wt Readings from  Last 500 Encounters:  05/19/16 187 lb (84.8 kg)  05/02/16 194 lb (88 kg)  04/12/16 196 lb 3.2 oz (89 kg)  05/22/15 190 lb (86.2 kg)  05/07/15 191 lb 2 oz (86.7 kg)  04/23/15 192 lb 9.6 oz (87.4 kg)  04/01/15 185 lb (83.9 kg)  02/06/15 187 lb 8 oz (85 kg)  10/28/14 185 lb 8 oz (84.1 kg)  08/01/14 182 lb 2 oz (82.6 kg)  03/20/14 179 lb 2 oz (81.3 kg)  11/15/13 181 lb 12.8 oz (82.5 kg)  04/24/13 173 lb 1.3 oz (78.5 kg)  04/10/13 172 lb 12 oz (78.4 kg)  12/10/12 180 lb 8 oz (81.9 kg)  11/07/12 176 lb 8 oz (80.1 kg)  05/18/12 180 lb 2 oz (81.7 kg)  12/15/11 172 lb 12.8 oz (78.4 kg)  02/14/11 162 lb 12.8 oz (73.8 kg)  12/09/10 162 lb (73.5 kg)  11/22/10 166 lb 6.4 oz (75.5 kg)  11/08/10 168 lb 1.9 oz (76.3 kg)  09/22/10 165 lb 6.4 oz (75 kg)  07/28/10 160 lb (72.6 kg)  07/20/10 158 lb 1.9 oz (71.7 kg)  06/21/10 161 lb (73 kg)  06/07/10 160 lb 12.8 oz (72.9 kg)  04/28/10 156 lb 6.4 oz (70.9 kg)  04/20/10 158 lb (71.7 kg)     ALLERGIES: No Known Allergies  MEDICATIONS: Current Outpatient Prescriptions on File Prior to Visit  Medication Sig Dispense Refill  . aspirin 81 MG chewable tablet Chew by mouth daily.    . chlorthalidone (HYGROTON) 25 MG tablet TAKE 1 TABLET DAILY 90 tablet 0  .  escitalopram (LEXAPRO) 20 MG tablet TAKE 1 TABLET DAILY 90 tablet 0  . Krill Oil 350 MG CAPS Take 350 mg by mouth every morning.    . Melatonin 10 MG TABS Take 10 mg by mouth at bedtime.    . metoprolol succinate (TOPROL-XL) 50 MG 24 hr tablet Take 1 tablet (50 mg total) by mouth daily. Take with or immediately following a meal. 90 tablet 2  . Naproxen-Esomeprazole (VIMOVO) 500-20 MG TBEC Take 500 mg by mouth 2 (two) times daily.    . ranitidine (ZANTAC) 300 MG tablet Take 1 tablet (300 mg total) by mouth at bedtime. 30 tablet 3  . Tryptophan 500 MG TABS Take 500 mg by mouth 2 times daily at 12 noon and 4 pm.    . valACYclovir (VALTREX) 1000 MG tablet TAKE 1 TABLET DAILY 90 tablet 2  . Vitamin  D-Vitamin K (D3 + K2 DOTS PO) Take 42 mg by mouth 2 (two) times daily.     No current facility-administered medications on file prior to visit.     PAST MEDICAL HISTORY: Past Medical History:  Diagnosis Date  . Alcohol abuse, in remission   . Anger   . Anxiety   . Back pain   . Chest pain   . Depression   . Drug use   . Fatigue 02/15/2015  . GERD (gastroesophageal reflux disease)   . HTN (hypertension)   . Hyperlipidemia   . Obesity 08/10/2014  . Obesity   . Pain in both feet    arthritis / Hallox Rigidus  . Perimenopausal 11/18/2013  . Raynaud's disease   . Screening for cervical cancer 02/06/2015  . Sleep apnea   . SOB (shortness of breath)   . Vitamin D deficiency     PAST SURGICAL HISTORY: Past Surgical History:  Procedure Laterality Date  . CESAREAN SECTION  2003  . TONSILLECTOMY  1980    SOCIAL HISTORY: Social History  Substance Use Topics  . Smoking status: Never Smoker  . Smokeless tobacco: Never Used  . Alcohol use 30.0 oz/week    50 Glasses of wine per week     Comment: quit ETOH on 2013    FAMILY HISTORY: Family History  Problem Relation Age of Onset  . Kidney failure Father 29    deceased  . Kidney disease Father   . Obesity Father   . Seizures Father   . Heart disease Father 71  . Hypertension Father   . Hyperlipidemia Father   . Stroke Father   . Sleep apnea Father   . Schizophrenia Brother     50yo  . Alcohol abuse Brother   . Other Brother     chronic pain, 77 yo  . Stroke Brother   . Alcohol abuse Sister     76yo  . Obesity Paternal Grandfather   . Stroke Sister   . Depression Mother   . Anxiety disorder Mother   . Obesity Mother     ROS: Review of Systems  Constitutional: Positive for malaise/fatigue and weight loss.  Cardiovascular: Negative for chest pain and claudication.  Gastrointestinal: Negative for nausea and vomiting.  Genitourinary: Negative for frequency.  Musculoskeletal: Negative for myalgias.       Negative  muscle weakness  Endo/Heme/Allergies: Negative for polydipsia.       Polyphagia Negative hypoglycemia    PHYSICAL EXAM: Blood pressure 135/67, pulse 97, temperature 98.4 F (36.9 C), temperature source Oral, height 5\' 4"  (1.626 m), weight 187 lb (84.8 kg), last  menstrual period 04/29/2016, SpO2 94 %. Body mass index is 32.1 kg/m. Physical Exam  Constitutional: She is oriented to person, place, and time. She appears well-developed and well-nourished.  Cardiovascular: Normal rate.   Pulmonary/Chest: Effort normal.  Musculoskeletal: Normal range of motion.  Neurological: She is oriented to person, place, and time.  Skin: Skin is warm and dry.  Psychiatric: She has a normal mood and affect. Her behavior is normal.  Vitals reviewed.   RECENT LABS AND TESTS: BMET    Component Value Date/Time   NA 143 05/02/2016 1142   K 3.8 05/02/2016 1142   CL 98 05/02/2016 1142   CO2 27 05/02/2016 1142   GLUCOSE 84 05/02/2016 1142   GLUCOSE 114 (H) 04/12/2016 1558   BUN 14 05/02/2016 1142   CREATININE 0.78 05/02/2016 1142   CREATININE 0.70 11/07/2012 1009   CALCIUM 9.6 05/02/2016 1142   GFRNONAA 87 05/02/2016 1142   GFRAA 100 05/02/2016 1142   Lab Results  Component Value Date   HGBA1C 5.6 05/02/2016   Lab Results  Component Value Date   INSULIN 8.1 05/02/2016   CBC    Component Value Date/Time   WBC 10.1 04/12/2016 1558   RBC 4.68 04/12/2016 1558   HGB 13.0 04/12/2016 1558   HCT 39.1 04/12/2016 1558   PLT 252.0 04/12/2016 1558   MCV 83.4 04/12/2016 1558   MCH 28.3 11/07/2012 1009   MCHC 33.3 04/12/2016 1558   RDW 13.5 04/12/2016 1558   LYMPHSABS 2.8 04/12/2016 1558   MONOABS 0.8 04/12/2016 1558   EOSABS 0.4 04/12/2016 1558   BASOSABS 0.1 04/12/2016 1558   Iron/TIBC/Ferritin/ %Sat No results found for: IRON, TIBC, FERRITIN, IRONPCTSAT Lipid Panel     Component Value Date/Time   CHOL 197 05/02/2016 1142   TRIG 157 (H) 05/02/2016 1142   HDL 40 05/02/2016 1142    CHOLHDL 6 04/12/2016 1558   VLDL 59.0 (H) 04/12/2016 1558   LDLCALC 126 (H) 05/02/2016 1142   LDLDIRECT 130.0 04/12/2016 1558   Hepatic Function Panel     Component Value Date/Time   PROT 7.2 05/02/2016 1142   ALBUMIN 4.3 05/02/2016 1142   AST 27 05/02/2016 1142   ALT 29 05/02/2016 1142   ALKPHOS 65 05/02/2016 1142   BILITOT 0.2 05/02/2016 1142   BILIDIR 0.0 11/15/2013 1504      Component Value Date/Time   TSH 1.15 04/12/2016 1558   TSH 1.65 05/07/2015 1123   TSH 1.85 11/19/2014 1030    ASSESSMENT AND PLAN: Mixed hyperlipidemia  Insulin resistance  Vitamin D deficiency  At risk for diabetes mellitus  Class 1 obesity without serious comorbidity with body mass index (BMI) of 32.0 to 32.9 in adult, unspecified obesity type  PLAN:  Vitamin D Deficiency Cheryl Taylor was informed that low vitamin D levels contributes to fatigue and are associated with obesity, breast, and colon cancer. She agrees to continue to take OTC Vit D and calcium daily and we will plan to re-check labs in 3 months and will follow up for routine testing of vitamin D, at least 2-3 times per year. She was informed of the risk of over-replacement of vitamin D and agrees to not increase her dose unless he discusses this with [place] first. She agrees to follow up with our clinic in 2 weeks.  Mixed Hyperlipidemia Cheryl Taylor was informed of the American Heart Association Guidelines emphasizing intensive lifestyle modifications as the first line treatment for hyperlipidemia. We discussed many lifestyle modifications today in depth, and Cheryl Taylor will continue  to work on decreasing saturated fats such as fatty red meat, butter and many fried foods. She will also increase vegetables and lean protein in her diet and continue to work on exercise and weight loss efforts. We will re-check labs in 3 months and Cheryl Taylor agrees to follow up with our clinic in 2 weeks.  Insulin Resistance Cheryl Taylor will continue to work on weight loss,  exercise, and decreasing simple carbohydrates in her diet to help decrease the risk of diabetes. We dicussed metformin including benefits and risks. She was informed that eating too many simple carbohydrates or too many calories at one sitting increases the likelihood of GI side effects. Cheryl Taylor declined metformin for now and prescription was not written today.  We will re-check labs in 3 months and Cheryl Taylor agreed to follow up with [place] as directed to monitor her progress.  Diabetes risk counselling Cheryl Taylor was given extended (at least 45 minutes) diabetes prevention counseling today. She is 51 y.o. female and has risk factors for diabetes including obesity. We discussed intensive lifestyle modifications today with an emphasis on weight loss as well as increasing exercise and decreasing simple carbohydrates in her diet.  Obesity Cheryl Taylor is currently in the action stage of change. As such, her goal is to continue with weight loss efforts She has agreed to follow the Category 2 plan +200 calories Cheryl Taylor has been instructed to work up to a goal of 150 minutes of combined cardio and strengthening exercise per week for weight loss and overall health benefits. We discussed the following Behavioral Modification Stratagies today: increasing vegetables, increasing fiber rich foods, dealing with family or coworker sabotage and ways to avoid night time snacking  Cheryl Taylor has agreed to follow up with our clinic in 2 weeks. She was informed of the importance of frequent follow up visits to maximize her success with intensive lifestyle modifications for her multiple health conditions.  I, Doreene Nest, am acting as scribe for Dennard Nip, MD  I have reviewed the above documentation for accuracy and completeness, and I agree with the above. -Dennard Nip, MD

## 2016-05-21 ENCOUNTER — Other Ambulatory Visit: Payer: Self-pay | Admitting: Family Medicine

## 2016-05-21 ENCOUNTER — Encounter: Payer: Self-pay | Admitting: Family Medicine

## 2016-05-23 ENCOUNTER — Ambulatory Visit: Payer: Self-pay | Admitting: Family Medicine

## 2016-05-23 MED ORDER — METOPROLOL SUCCINATE ER 50 MG PO TB24: ORAL_TABLET | ORAL | 6 refills | Status: DC

## 2016-05-26 ENCOUNTER — Other Ambulatory Visit: Payer: Self-pay | Admitting: Family Medicine

## 2016-06-02 ENCOUNTER — Ambulatory Visit (INDEPENDENT_AMBULATORY_CARE_PROVIDER_SITE_OTHER): Payer: Managed Care, Other (non HMO) | Admitting: Family Medicine

## 2016-06-02 ENCOUNTER — Encounter (INDEPENDENT_AMBULATORY_CARE_PROVIDER_SITE_OTHER): Payer: Self-pay | Admitting: Family Medicine

## 2016-06-02 VITALS — BP 107/69 | HR 64 | Temp 98.3°F | Ht 64.0 in | Wt 184.0 lb

## 2016-06-02 DIAGNOSIS — Z6831 Body mass index (BMI) 31.0-31.9, adult: Secondary | ICD-10-CM | POA: Diagnosis not present

## 2016-06-02 DIAGNOSIS — E669 Obesity, unspecified: Secondary | ICD-10-CM | POA: Diagnosis not present

## 2016-06-02 DIAGNOSIS — R7303 Prediabetes: Secondary | ICD-10-CM | POA: Diagnosis not present

## 2016-06-03 NOTE — Progress Notes (Signed)
Office: [phone]  /  Fax: [phone]   HPI:   Chief Complaint: OBESITY Cheryl Taylor is here to discuss her progress with her obesity treatment plan. She is on the  follow the Category 2 plan+ 100 calories end of day meal and is following her eating plan approximately 95 % of the time. She states she is exercising 0 minutes 0 times per week. Cheryl Taylor continues to do well with weight loss. She is doing well controlling her portions and calories. She is eating more at home due to decreased travel. She is planning meals ahead of time and has good family support. Her weight is 184 lb (83.5 kg) today and has had a weight loss of 3 pounds over a period of 2 weeks since her last visit. She has lost 10 lbs since starting treatment with [place].  Pre-Diabetes Cheryl Taylor has a diagnosis of prediabetes based on her elevated Hgb A1c and was informed this puts her at greater risk of developing diabetes. She is not taking metformin currently and continues to work on diet and exercise to decrease risk of diabetes. She is doing well on diet prescription and is working on portion control simple carbohydrates and increasing lean protein and vegetables. She denies nausea or hypoglycemia.   Wt Readings from Last 500 Encounters:  06/02/16 184 lb (83.5 kg)  05/19/16 187 lb (84.8 kg)  05/02/16 194 lb (88 kg)  04/12/16 196 lb 3.2 oz (89 kg)  05/22/15 190 lb (86.2 kg)  05/07/15 191 lb 2 oz (86.7 kg)  04/23/15 192 lb 9.6 oz (87.4 kg)  04/01/15 185 lb (83.9 kg)  02/06/15 187 lb 8 oz (85 kg)  10/28/14 185 lb 8 oz (84.1 kg)  08/01/14 182 lb 2 oz (82.6 kg)  03/20/14 179 lb 2 oz (81.3 kg)  11/15/13 181 lb 12.8 oz (82.5 kg)  04/24/13 173 lb 1.3 oz (78.5 kg)  04/10/13 172 lb 12 oz (78.4 kg)  12/10/12 180 lb 8 oz (81.9 kg)  11/07/12 176 lb 8 oz (80.1 kg)  05/18/12 180 lb 2 oz (81.7 kg)  12/15/11 172 lb 12.8 oz (78.4 kg)  02/14/11 162 lb 12.8 oz (73.8 kg)  12/09/10 162 lb (73.5 kg)  11/22/10 166 lb 6.4 oz (75.5 kg)    11/08/10 168 lb 1.9 oz (76.3 kg)  09/22/10 165 lb 6.4 oz (75 kg)  07/28/10 160 lb (72.6 kg)  07/20/10 158 lb 1.9 oz (71.7 kg)  06/21/10 161 lb (73 kg)  06/07/10 160 lb 12.8 oz (72.9 kg)  04/28/10 156 lb 6.4 oz (70.9 kg)  04/20/10 158 lb (71.7 kg)     ALLERGIES: No Known Allergies  MEDICATIONS: Current Outpatient Prescriptions on File Prior to Visit  Medication Sig Dispense Refill  . aspirin 81 MG chewable tablet Chew by mouth daily.    . chlorthalidone (HYGROTON) 25 MG tablet TAKE 1 TABLET DAILY 90 tablet 0  . escitalopram (LEXAPRO) 20 MG tablet TAKE 1 TABLET DAILY 90 tablet 0  . Krill Oil 350 MG CAPS Take 350 mg by mouth every morning.    . Melatonin 10 MG TABS Take 10 mg by mouth at bedtime.    . metoprolol succinate (TOPROL-XL) 50 MG 24 hr tablet TAKE 1 TABLET DAILY (TAKE WITH OR IMMEDIATELY FOLLOWING A MEAL) 30 tablet 6  . Naproxen-Esomeprazole (VIMOVO) 500-20 MG TBEC Take 500 mg by mouth 2 (two) times daily.    . ranitidine (ZANTAC) 300 MG tablet Take 1 tablet (300 mg total) by mouth at bedtime.  30 tablet 3  . Tryptophan 500 MG TABS Take 500 mg by mouth 2 times daily at 12 noon and 4 pm.    . valACYclovir (VALTREX) 1000 MG tablet TAKE 1 TABLET DAILY 90 tablet 2  . Vitamin D-Vitamin K (D3 + K2 DOTS PO) Take 42 mg by mouth 2 (two) times daily.     No current facility-administered medications on file prior to visit.     PAST MEDICAL HISTORY: Past Medical History:  Diagnosis Date  . Alcohol abuse, in remission   . Anger   . Anxiety   . Back pain   . Chest pain   . Depression   . Drug use   . Fatigue 02/15/2015  . GERD (gastroesophageal reflux disease)   . HTN (hypertension)   . Hyperlipidemia   . Obesity 08/10/2014  . Obesity   . Pain in both feet    arthritis / Hallox Rigidus  . Perimenopausal 11/18/2013  . Raynaud's disease   . Screening for cervical cancer 02/06/2015  . Sleep apnea   . SOB (shortness of breath)   . Vitamin D deficiency     PAST SURGICAL  HISTORY: Past Surgical History:  Procedure Laterality Date  . CESAREAN SECTION  2003  . TONSILLECTOMY  1980    SOCIAL HISTORY: Social History  Substance Use Topics  . Smoking status: Never Smoker  . Smokeless tobacco: Never Used  . Alcohol use 30.0 oz/week    50 Glasses of wine per week     Comment: quit ETOH on 2013    FAMILY HISTORY: Family History  Problem Relation Age of Onset  . Kidney failure Father 35    deceased  . Kidney disease Father   . Obesity Father   . Seizures Father   . Heart disease Father 76  . Hypertension Father   . Hyperlipidemia Father   . Stroke Father   . Sleep apnea Father   . Schizophrenia Brother     7yo  . Alcohol abuse Brother   . Other Brother     chronic pain, 64 yo  . Stroke Brother   . Alcohol abuse Sister     2yo  . Obesity Paternal Grandfather   . Stroke Sister   . Depression Mother   . Anxiety disorder Mother   . Obesity Mother     ROS: Review of Systems  Constitutional: Positive for weight loss.  Gastrointestinal: Negative for nausea.  Endo/Heme/Allergies:       Negative hypoglycemia    PHYSICAL EXAM: Blood pressure 107/69, pulse 64, temperature 98.3 F (36.8 C), temperature source Oral, height 5\' 4"  (1.626 m), weight 184 lb (83.5 kg), last menstrual period 05/03/2016, SpO2 97 %. Body mass index is 31.58 kg/m. Physical Exam  Constitutional: She is oriented to person, place, and time. She appears well-developed and well-nourished.  Cardiovascular: Normal rate.   Pulmonary/Chest: Effort normal.  Musculoskeletal: Normal range of motion.  Neurological: She is oriented to person, place, and time.  Skin: Skin is warm and dry.  Psychiatric: She has a normal mood and affect. Her behavior is normal.  Vitals reviewed.   RECENT LABS AND TESTS: BMET    Component Value Date/Time   NA 143 05/02/2016 1142   K 3.8 05/02/2016 1142   CL 98 05/02/2016 1142   CO2 27 05/02/2016 1142   GLUCOSE 84 05/02/2016 1142   GLUCOSE  114 (H) 04/12/2016 1558   BUN 14 05/02/2016 1142   CREATININE 0.78 05/02/2016 1142   CREATININE  0.70 11/07/2012 1009   CALCIUM 9.6 05/02/2016 1142   GFRNONAA 87 05/02/2016 1142   GFRAA 100 05/02/2016 1142   Lab Results  Component Value Date   HGBA1C 5.6 05/02/2016   Lab Results  Component Value Date   INSULIN 8.1 05/02/2016   CBC    Component Value Date/Time   WBC 10.1 04/12/2016 1558   RBC 4.68 04/12/2016 1558   HGB 13.0 04/12/2016 1558   HCT 39.1 04/12/2016 1558   PLT 252.0 04/12/2016 1558   MCV 83.4 04/12/2016 1558   MCH 28.3 11/07/2012 1009   MCHC 33.3 04/12/2016 1558   RDW 13.5 04/12/2016 1558   LYMPHSABS 2.8 04/12/2016 1558   MONOABS 0.8 04/12/2016 1558   EOSABS 0.4 04/12/2016 1558   BASOSABS 0.1 04/12/2016 1558   Iron/TIBC/Ferritin/ %Sat No results found for: IRON, TIBC, FERRITIN, IRONPCTSAT Lipid Panel     Component Value Date/Time   CHOL 197 05/02/2016 1142   TRIG 157 (H) 05/02/2016 1142   HDL 40 05/02/2016 1142   CHOLHDL 6 04/12/2016 1558   VLDL 59.0 (H) 04/12/2016 1558   LDLCALC 126 (H) 05/02/2016 1142   LDLDIRECT 130.0 04/12/2016 1558   Hepatic Function Panel     Component Value Date/Time   PROT 7.2 05/02/2016 1142   ALBUMIN 4.3 05/02/2016 1142   AST 27 05/02/2016 1142   ALT 29 05/02/2016 1142   ALKPHOS 65 05/02/2016 1142   BILITOT 0.2 05/02/2016 1142   BILIDIR 0.0 11/15/2013 1504      Component Value Date/Time   TSH 1.15 04/12/2016 1558   TSH 1.65 05/07/2015 1123   TSH 1.85 11/19/2014 1030    ASSESSMENT AND PLAN: Prediabetes  Class 1 obesity without serious comorbidity with body mass index (BMI) of 31.0 to 31.9 in adult, unspecified obesity type  PLAN:  Pre-Diabetes Jadea will continue to work on weight loss, exercise, and decreasing simple carbohydrates in her diet to help decrease the risk of diabetes. We dicussed metformin including benefits and risks. She was informed that eating too many simple carbohydrates or too many  calories at one sitting increases the likelihood of GI side effects. We will re-check labs in in 2 months Tacey agreed to follow up with [place] as directed to monitor her progress.  We spent > than 50% of the 15 minute visit on the counseling as documented in the note.  Obesity Carrol is currently in the action stage of change. As such, her goal is to continue with weight loss efforts She has agreed to follow the Category 2 plan Arriona has been instructed to work up to a goal of 150 minutes of combined cardio and strengthening exercise per week for weight loss and overall health benefits. We discussed the following Behavioral Modification Stratagies today: increasing lean protein intake and work on meal planning and easy cooking plans  Marvia has agreed to follow up with our clinic in 2 weeks. She was informed of the importance of frequent follow up visits to maximize her success with intensive lifestyle modifications for her multiple health conditions.  I, Doreene Nest, am acting as scribe for Dennard Nip, MD  I have reviewed the above documentation for accuracy and completeness, and I agree with the above. -Dennard Nip, MD

## 2016-06-10 ENCOUNTER — Other Ambulatory Visit: Payer: Self-pay | Admitting: Family Medicine

## 2016-06-10 MED ORDER — RANITIDINE HCL 300 MG PO TABS: 300.0000 mg | ORAL_TABLET | Freq: Every day | ORAL | 1 refills | Status: DC

## 2016-06-16 ENCOUNTER — Ambulatory Visit (INDEPENDENT_AMBULATORY_CARE_PROVIDER_SITE_OTHER): Payer: Managed Care, Other (non HMO) | Admitting: Family Medicine

## 2016-06-16 VITALS — BP 119/73 | HR 68 | Temp 97.9°F | Ht 64.0 in | Wt 182.0 lb

## 2016-06-16 DIAGNOSIS — E669 Obesity, unspecified: Secondary | ICD-10-CM

## 2016-06-16 DIAGNOSIS — F3289 Other specified depressive episodes: Secondary | ICD-10-CM

## 2016-06-16 DIAGNOSIS — Z6831 Body mass index (BMI) 31.0-31.9, adult: Secondary | ICD-10-CM | POA: Diagnosis not present

## 2016-06-16 NOTE — Progress Notes (Signed)
Office: [phone]  /  Fax: [phone]   HPI:   Chief Complaint: OBESITY Cheryl Taylor is here to discuss her progress with her obesity treatment plan. She is on the  follow the Category 2 plan and is following her eating plan approximately 85 % of the time. She states she is exercising 0 minutes 0 times per week. Cheryl Taylor continues to do well with weight loss. She had some extra celebration eating in the last 2 weeks. Her weight is 182 lb (82.6 kg) today and has had a weight loss of 2 pounds over a period of 2 weeks since her last visit. She has lost 10 lbs since starting treatment with [place].  Depression with emotional eating behaviors Cheryl Taylor is stable on Lexapro and notes feeling more in control of her emotional eating. She struggles with emotional eating and using food for comfort to the extent that it is negatively impacting her health. She often snacks when she is not hungry. Cheryl Taylor feels she is out of control and then feels guilty that she made poor food choices. She has been working on behavior modification techniques to help reduce her emotional eating and has been somewhat successful. Her blood pressure is stable and she denies insomnia. She shows no sign of suicidal or homicidal ideations.  Depression screen Cheryl Taylor 2/9 05/02/2016 05/22/2015  Decreased Interest 3 0  Down, Depressed, Hopeless 3 0  PHQ - 2 Score 6 0  Altered sleeping 3 -  Tired, decreased energy 3 -  Change in appetite 3 -  Feeling bad or failure about yourself  1 -  Trouble concentrating 0 -  Moving slowly or fidgety/restless 1 -  Suicidal thoughts 1 -  PHQ-9 Score 18 -      ALLERGIES: No Known Allergies  MEDICATIONS: Current Outpatient Prescriptions on File Prior to Visit  Medication Sig Dispense Refill  . aspirin 81 MG chewable tablet Chew by mouth daily.    . chlorthalidone (HYGROTON) 25 MG tablet TAKE 1 TABLET DAILY 90 tablet 0  . escitalopram (LEXAPRO) 20 MG tablet TAKE 1 TABLET DAILY 90  tablet 0  . Krill Oil 350 MG CAPS Take 350 mg by mouth every morning.    . Melatonin 10 MG TABS Take 10 mg by mouth at bedtime.    . metoprolol succinate (TOPROL-XL) 50 MG 24 hr tablet TAKE 1 TABLET DAILY (TAKE WITH OR IMMEDIATELY FOLLOWING A MEAL) 30 tablet 6  . Naproxen-Esomeprazole (VIMOVO) 500-20 MG TBEC Take 500 mg by mouth 2 (two) times daily.    . ranitidine (ZANTAC) 300 MG tablet Take 1 tablet (300 mg total) by mouth at bedtime. 90 tablet 1  . Tryptophan 500 MG TABS Take 500 mg by mouth 2 times daily at 12 noon and 4 pm.    . valACYclovir (VALTREX) 1000 MG tablet TAKE 1 TABLET DAILY 90 tablet 2  . Vitamin D-Vitamin K (D3 + K2 DOTS PO) Take 42 mg by mouth 2 (two) times daily.     No current facility-administered medications on file prior to visit.     PAST MEDICAL HISTORY: Past Medical History:  Diagnosis Date  . Alcohol abuse, in remission   . Anger   . Anxiety   . Back pain   . Chest pain   . Depression   . Drug use   . Fatigue 02/15/2015  . GERD (gastroesophageal reflux disease)   . HTN (hypertension)   . Hyperlipidemia   . Obesity 08/10/2014  . Obesity   . Pain  in both feet    arthritis / Hallox Rigidus  . Perimenopausal 11/18/2013  . Raynaud's disease   . Screening for cervical cancer 02/06/2015  . Sleep apnea   . SOB (shortness of breath)   . Vitamin D deficiency     PAST SURGICAL HISTORY: Past Surgical History:  Procedure Laterality Date  . CESAREAN SECTION  2003  . TONSILLECTOMY  1980    SOCIAL HISTORY: Social History  Substance Use Topics  . Smoking status: Never Smoker  . Smokeless tobacco: Never Used  . Alcohol use 30.0 oz/week    50 Glasses of wine per week     Comment: quit ETOH on 2013    FAMILY HISTORY: Family History  Problem Relation Age of Onset  . Kidney failure Father 21       deceased  . Kidney disease Father   . Obesity Father   . Seizures Father   . Heart disease Father 70  . Hypertension Father   . Hyperlipidemia Father   .  Stroke Father   . Sleep apnea Father   . Schizophrenia Brother        19yo  . Alcohol abuse Brother   . Other Brother        chronic pain, 25 yo  . Stroke Brother   . Alcohol abuse Sister        55yo  . Obesity Paternal Grandfather   . Stroke Sister   . Depression Mother   . Anxiety disorder Mother   . Obesity Mother     ROS: Review of Systems  Constitutional: Positive for weight loss.  Psychiatric/Behavioral: Positive for depression. Negative for suicidal ideas. The patient does not have insomnia.     PHYSICAL EXAM: Blood pressure 119/73, pulse 68, temperature 97.9 F (36.6 C), temperature source Oral, height 5\' 4"  (1.626 m), weight 182 lb (82.6 kg), last menstrual period 06/14/2016, SpO2 97 %. Body mass index is 31.24 kg/m. Physical Exam  Constitutional: She is oriented to person, place, and time. She appears well-developed and well-nourished.  Cardiovascular: Normal rate.   Pulmonary/Chest: Effort normal.  Musculoskeletal: Normal range of motion.  Neurological: She is oriented to person, place, and time.  Skin: Skin is warm and dry.  Psychiatric: She has a normal mood and affect. Her behavior is normal.  Vitals reviewed.   RECENT LABS AND TESTS: BMET    Component Value Date/Time   NA 143 05/02/2016 1142   K 3.8 05/02/2016 1142   CL 98 05/02/2016 1142   CO2 27 05/02/2016 1142   GLUCOSE 84 05/02/2016 1142   GLUCOSE 114 (H) 04/12/2016 1558   BUN 14 05/02/2016 1142   CREATININE 0.78 05/02/2016 1142   CREATININE 0.70 11/07/2012 1009   CALCIUM 9.6 05/02/2016 1142   GFRNONAA 87 05/02/2016 1142   GFRAA 100 05/02/2016 1142   Lab Results  Component Value Date   HGBA1C 5.6 05/02/2016   Lab Results  Component Value Date   INSULIN 8.1 05/02/2016   CBC    Component Value Date/Time   WBC 10.1 04/12/2016 1558   RBC 4.68 04/12/2016 1558   HGB 13.0 04/12/2016 1558   HCT 39.1 04/12/2016 1558   PLT 252.0 04/12/2016 1558   MCV 83.4 04/12/2016 1558   MCH 28.3  11/07/2012 1009   MCHC 33.3 04/12/2016 1558   RDW 13.5 04/12/2016 1558   LYMPHSABS 2.8 04/12/2016 1558   MONOABS 0.8 04/12/2016 1558   EOSABS 0.4 04/12/2016 1558   BASOSABS 0.1 04/12/2016 1558  Iron/TIBC/Ferritin/ %Sat No results found for: IRON, TIBC, FERRITIN, IRONPCTSAT Lipid Panel     Component Value Date/Time   CHOL 197 05/02/2016 1142   TRIG 157 (H) 05/02/2016 1142   HDL 40 05/02/2016 1142   CHOLHDL 6 04/12/2016 1558   VLDL 59.0 (H) 04/12/2016 1558   LDLCALC 126 (H) 05/02/2016 1142   LDLDIRECT 130.0 04/12/2016 1558   Hepatic Function Panel     Component Value Date/Time   PROT 7.2 05/02/2016 1142   ALBUMIN 4.3 05/02/2016 1142   AST 27 05/02/2016 1142   ALT 29 05/02/2016 1142   ALKPHOS 65 05/02/2016 1142   BILITOT 0.2 05/02/2016 1142   BILIDIR 0.0 11/15/2013 1504      Component Value Date/Time   TSH 1.15 04/12/2016 1558   TSH 1.65 05/07/2015 1123   TSH 1.85 11/19/2014 1030    ASSESSMENT AND PLAN: Other depression  Class 1 obesity without serious comorbidity with body mass index (BMI) of 31.0 to 31.9 in adult, unspecified obesity type  PLAN:  Depression with Emotional Eating Behaviors We discussed behavior modification techniques today to help Cheryl Taylor deal with her emotional eating and depression. She has agreed to continue Lexapro and cognitive behavioral therapy. Cheryl Taylor agreed to follow up with our clinic in 2 weeks..  We spent > than 50% of the 15 minute visit on the counseling as documented in the note.  Obesity Cheryl Taylor is currently in the action stage of change. As such, her goal is to continue with weight loss efforts She has agreed to follow the Category 2 plan Cheryl Taylor has been instructed to work up to a goal of 150 minutes of combined cardio and strengthening exercise per week for weight loss and overall health benefits. We discussed the following Behavioral Modification Stratagies today: increasing lean protein intake and emotional eating  strategies  Cheryl Taylor has agreed to follow up with our clinic in 2 weeks. She was informed of the importance of frequent follow up visits to maximize her success with intensive lifestyle modifications for her multiple health conditions.  I, Doreene Nest, am acting as scribe for Dennard Nip, MD  I have reviewed the above documentation for accuracy and completeness, and I agree with the above. -Dennard Nip, MD

## 2016-06-20 ENCOUNTER — Ambulatory Visit: Payer: Self-pay | Admitting: Family Medicine

## 2016-06-30 ENCOUNTER — Ambulatory Visit (INDEPENDENT_AMBULATORY_CARE_PROVIDER_SITE_OTHER): Payer: Managed Care, Other (non HMO) | Admitting: Family Medicine

## 2016-06-30 VITALS — BP 109/60 | HR 58 | Temp 98.1°F | Ht 64.0 in | Wt 180.0 lb

## 2016-06-30 DIAGNOSIS — E669 Obesity, unspecified: Secondary | ICD-10-CM | POA: Diagnosis not present

## 2016-06-30 DIAGNOSIS — E8881 Metabolic syndrome: Secondary | ICD-10-CM | POA: Diagnosis not present

## 2016-06-30 DIAGNOSIS — Z6831 Body mass index (BMI) 31.0-31.9, adult: Secondary | ICD-10-CM | POA: Diagnosis not present

## 2016-06-30 MED ORDER — METFORMIN HCL 500 MG PO TABS
500.0000 mg | ORAL_TABLET | Freq: Every day | ORAL | 0 refills | Status: DC
Start: 2016-06-30 — End: 2016-07-26

## 2016-06-30 NOTE — Progress Notes (Signed)
Office: [phone]  /  Fax: [phone]   HPI:   Chief Complaint: OBESITY Cheryl Taylor is here to discuss her progress with her obesity treatment plan. She is on the  follow the Category 2 plan and is following her eating plan approximately 80 % of the time. She states she is exercising 0 minutes 0 times per week. Karita continues to do well with weight loss and journaling. She is having some issues with pm hunger and dinner calories are going too high. Her weight is 180 lb (81.6 kg) today and has had a weight loss of 2 pounds over a period of 2 weeks since her last visit. She has lost 14 lbs since starting treatment with [place].  Insulin Resistance Ruey has a new diagnosis of insulin resistance based on her elevated fasting insulin level >5. Although Avital's blood glucose readings are still under good control, insulin resistance puts her at greater risk of metabolic syndrome and diabetes. She is not taking metformin currently and continues to work on diet and exercise to decrease risk of diabetes. She notes increased polyphagia, especially in the pm. She is doing well on diet prescription.   ALLERGIES: No Known Allergies  MEDICATIONS: Current Outpatient Prescriptions on File Prior to Visit  Medication Sig Dispense Refill  . aspirin 81 MG chewable tablet Chew by mouth daily.    . chlorthalidone (HYGROTON) 25 MG tablet TAKE 1 TABLET DAILY 90 tablet 0  . escitalopram (LEXAPRO) 20 MG tablet TAKE 1 TABLET DAILY 90 tablet 0  . Krill Oil 350 MG CAPS Take 350 mg by mouth every morning.    . Melatonin 10 MG TABS Take 10 mg by mouth at bedtime.    . metoprolol succinate (TOPROL-XL) 50 MG 24 hr tablet TAKE 1 TABLET DAILY (TAKE WITH OR IMMEDIATELY FOLLOWING A MEAL) 30 tablet 6  . Naproxen-Esomeprazole (VIMOVO) 500-20 MG TBEC Take 500 mg by mouth 2 (two) times daily.    . ranitidine (ZANTAC) 300 MG tablet Take 1 tablet (300 mg total) by mouth at bedtime. 90 tablet 1  . Tryptophan 500 MG TABS  Take 500 mg by mouth 2 times daily at 12 noon and 4 pm.    . valACYclovir (VALTREX) 1000 MG tablet TAKE 1 TABLET DAILY 90 tablet 2  . Vitamin D-Vitamin K (D3 + K2 DOTS PO) Take 42 mg by mouth 2 (two) times daily.     No current facility-administered medications on file prior to visit.     PAST MEDICAL HISTORY: Past Medical History:  Diagnosis Date  . Alcohol abuse, in remission   . Anger   . Anxiety   . Back pain   . Chest pain   . Depression   . Drug use   . Fatigue 02/15/2015  . GERD (gastroesophageal reflux disease)   . HTN (hypertension)   . Hyperlipidemia   . Obesity 08/10/2014  . Obesity   . Pain in both feet    arthritis / Hallox Rigidus  . Perimenopausal 11/18/2013  . Raynaud's disease   . Screening for cervical cancer 02/06/2015  . Sleep apnea   . SOB (shortness of breath)   . Vitamin D deficiency     PAST SURGICAL HISTORY: Past Surgical History:  Procedure Laterality Date  . CESAREAN SECTION  2003  . TONSILLECTOMY  1980    SOCIAL HISTORY: Social History  Substance Use Topics  . Smoking status: Never Smoker  . Smokeless tobacco: Never Used  . Alcohol use 30.0 oz/week  50 Glasses of wine per week     Comment: quit ETOH on 2013    FAMILY HISTORY: Family History  Problem Relation Age of Onset  . Kidney failure Father 42       deceased  . Kidney disease Father   . Obesity Father   . Seizures Father   . Heart disease Father 37  . Hypertension Father   . Hyperlipidemia Father   . Stroke Father   . Sleep apnea Father   . Schizophrenia Brother        73yo  . Alcohol abuse Brother   . Other Brother        chronic pain, 50 yo  . Stroke Brother   . Alcohol abuse Sister        70yo  . Obesity Paternal Grandfather   . Stroke Sister   . Depression Mother   . Anxiety disorder Mother   . Obesity Mother     ROS: Review of Systems  Constitutional: Positive for weight loss.  Endo/Heme/Allergies:       Polyphagia     PHYSICAL EXAM: Blood  pressure 109/60, pulse (!) 58, temperature 98.1 F (36.7 C), temperature source Oral, height 5\' 4"  (1.626 m), weight 180 lb (81.6 kg), last menstrual period 06/14/2016, SpO2 97 %. Body mass index is 30.9 kg/m. Physical Exam  Constitutional: She is oriented to person, place, and time. She appears well-developed and well-nourished.  Cardiovascular: Normal rate.   Pulmonary/Chest: Effort normal.  Musculoskeletal: Normal range of motion.  Neurological: She is oriented to person, place, and time.  Skin: Skin is warm and dry.  Psychiatric: She has a normal mood and affect. Her behavior is normal.  Vitals reviewed.   RECENT LABS AND TESTS: BMET    Component Value Date/Time   NA 143 05/02/2016 1142   K 3.8 05/02/2016 1142   CL 98 05/02/2016 1142   CO2 27 05/02/2016 1142   GLUCOSE 84 05/02/2016 1142   GLUCOSE 114 (H) 04/12/2016 1558   BUN 14 05/02/2016 1142   CREATININE 0.78 05/02/2016 1142   CREATININE 0.70 11/07/2012 1009   CALCIUM 9.6 05/02/2016 1142   GFRNONAA 87 05/02/2016 1142   GFRAA 100 05/02/2016 1142   Lab Results  Component Value Date   HGBA1C 5.6 05/02/2016   Lab Results  Component Value Date   INSULIN 8.1 05/02/2016   CBC    Component Value Date/Time   WBC 10.1 04/12/2016 1558   RBC 4.68 04/12/2016 1558   HGB 13.0 04/12/2016 1558   HCT 39.1 04/12/2016 1558   PLT 252.0 04/12/2016 1558   MCV 83.4 04/12/2016 1558   MCH 28.3 11/07/2012 1009   MCHC 33.3 04/12/2016 1558   RDW 13.5 04/12/2016 1558   LYMPHSABS 2.8 04/12/2016 1558   MONOABS 0.8 04/12/2016 1558   EOSABS 0.4 04/12/2016 1558   BASOSABS 0.1 04/12/2016 1558   Iron/TIBC/Ferritin/ %Sat No results found for: IRON, TIBC, FERRITIN, IRONPCTSAT Lipid Panel     Component Value Date/Time   CHOL 197 05/02/2016 1142   TRIG 157 (H) 05/02/2016 1142   HDL 40 05/02/2016 1142   CHOLHDL 6 04/12/2016 1558   VLDL 59.0 (H) 04/12/2016 1558   LDLCALC 126 (H) 05/02/2016 1142   LDLDIRECT 130.0 04/12/2016 1558    Hepatic Function Panel     Component Value Date/Time   PROT 7.2 05/02/2016 1142   ALBUMIN 4.3 05/02/2016 1142   AST 27 05/02/2016 1142   ALT 29 05/02/2016 1142   ALKPHOS 65 05/02/2016  1142   BILITOT 0.2 05/02/2016 1142   BILIDIR 0.0 11/15/2013 1504      Component Value Date/Time   TSH 1.15 04/12/2016 1558   TSH 1.65 05/07/2015 1123   TSH 1.85 11/19/2014 1030    ASSESSMENT AND PLAN: Insulin resistance - Plan: metFORMIN (GLUCOPHAGE) 500 MG tablet  Class 1 obesity without serious comorbidity with body mass index (BMI) of 31.0 to 31.9 in adult, unspecified obesity type  PLAN:  Insulin Resistance Pegi will continue to work on weight loss, exercise, and decreasing simple carbohydrates in her diet to help decrease the risk of diabetes. We dicussed metformin including benefits and risks. She was informed that eating too many simple carbohydrates or too many calories at one sitting increases the likelihood of GI side effects. Melannie requested metformin for now and prescription was written today for metformin 500 mg every morning #30 with no refills. Koraima agreed to follow up with [place] as directed to monitor her progress.  Obesity Glori is currently in the action stage of change. As such, her goal is to continue with weight loss efforts She has agreed to follow the Category 2 plan Loany has been instructed to work up to a goal of 150 minutes of combined cardio and strengthening exercise per week for weight loss and overall health benefits. We discussed the following Behavioral Modification Strategies today: increasing lean protein intake, work on meal planning and easy cooking plans and decrease junk food  Yesika has agreed to follow up with our clinic in 2 weeks. She was informed of the importance of frequent follow up visits to maximize her success with intensive lifestyle modifications for her multiple health conditions.  I, Doreene Nest, am acting as scribe for  Dennard Nip, MD  I have reviewed the above documentation for accuracy and completeness, and I agree with the above. -Dennard Nip, MD  OBESITY BEHAVIORAL INTERVENTION VISIT  Today's visit was # 5 out of 22.  Starting weight: 194 lbs Starting date: 05/02/16 Today's weight : 180 lbs  Today's date: 06/30/2016 Total lbs lost to date: 14 (Patients must lose 7 lbs in the first 6 months to continue with counseling)   ASK: We discussed the diagnosis of obesity with Lolita Patella today and Johana agreed to give [place] permission to discuss obesity behavioral modification therapy today.  ASSESS: Smrithi has the diagnosis of obesity and her BMI today is 51 Alleigh is in the action stage of change   ADVISE: Devora was educated on the multiple health risks of obesity as well as the benefit of weight loss to improve her health. She was advised of the need for long term treatment and the importance of lifestyle modifications.  AGREE: Multiple dietary modification options and treatment options were discussed and  Oumou agreed to follow the Category 2 plan We discussed the following Behavioral Modification Strategies today: increasing lean protein intake, work on meal planning and easy cooking plans and decrease junk food

## 2016-07-13 ENCOUNTER — Other Ambulatory Visit: Payer: Self-pay | Admitting: Family Medicine

## 2016-07-15 ENCOUNTER — Encounter (INDEPENDENT_AMBULATORY_CARE_PROVIDER_SITE_OTHER): Payer: Self-pay | Admitting: Family Medicine

## 2016-07-19 ENCOUNTER — Encounter (INDEPENDENT_AMBULATORY_CARE_PROVIDER_SITE_OTHER): Payer: Self-pay

## 2016-07-19 ENCOUNTER — Ambulatory Visit (INDEPENDENT_AMBULATORY_CARE_PROVIDER_SITE_OTHER): Payer: Managed Care, Other (non HMO) | Admitting: Family Medicine

## 2016-07-22 ENCOUNTER — Other Ambulatory Visit (INDEPENDENT_AMBULATORY_CARE_PROVIDER_SITE_OTHER): Payer: Self-pay | Admitting: Family Medicine

## 2016-07-22 DIAGNOSIS — E8881 Metabolic syndrome: Secondary | ICD-10-CM

## 2016-07-26 ENCOUNTER — Other Ambulatory Visit (INDEPENDENT_AMBULATORY_CARE_PROVIDER_SITE_OTHER): Payer: Self-pay

## 2016-07-26 DIAGNOSIS — E8881 Metabolic syndrome: Secondary | ICD-10-CM

## 2016-07-26 MED ORDER — METFORMIN HCL 500 MG PO TABS: 500.0000 mg | ORAL_TABLET | Freq: Every day | ORAL | 0 refills | Status: DC

## 2016-07-27 ENCOUNTER — Encounter (INDEPENDENT_AMBULATORY_CARE_PROVIDER_SITE_OTHER): Payer: Self-pay

## 2016-07-27 ENCOUNTER — Telehealth (INDEPENDENT_AMBULATORY_CARE_PROVIDER_SITE_OTHER): Payer: Self-pay

## 2016-07-28 ENCOUNTER — Other Ambulatory Visit (INDEPENDENT_AMBULATORY_CARE_PROVIDER_SITE_OTHER): Payer: Self-pay

## 2016-07-28 DIAGNOSIS — E8881 Metabolic syndrome: Secondary | ICD-10-CM

## 2016-07-28 MED ORDER — METFORMIN HCL 500 MG PO TABS: 500.0000 mg | ORAL_TABLET | Freq: Every day | ORAL | 0 refills | Status: DC

## 2016-08-08 ENCOUNTER — Ambulatory Visit (INDEPENDENT_AMBULATORY_CARE_PROVIDER_SITE_OTHER): Payer: Managed Care, Other (non HMO) | Admitting: Family Medicine

## 2016-08-15 ENCOUNTER — Ambulatory Visit (INDEPENDENT_AMBULATORY_CARE_PROVIDER_SITE_OTHER): Payer: Managed Care, Other (non HMO) | Admitting: Physician Assistant

## 2016-08-15 VITALS — BP 113/61 | HR 52 | Temp 98.3°F | Ht 64.0 in | Wt 179.0 lb

## 2016-08-15 DIAGNOSIS — I10 Essential (primary) hypertension: Secondary | ICD-10-CM | POA: Diagnosis not present

## 2016-08-15 DIAGNOSIS — Z6830 Body mass index (BMI) 30.0-30.9, adult: Secondary | ICD-10-CM

## 2016-08-15 DIAGNOSIS — E669 Obesity, unspecified: Secondary | ICD-10-CM | POA: Diagnosis not present

## 2016-08-15 DIAGNOSIS — E8881 Metabolic syndrome: Secondary | ICD-10-CM | POA: Diagnosis not present

## 2016-08-15 DIAGNOSIS — E119 Type 2 diabetes mellitus without complications: Secondary | ICD-10-CM | POA: Insufficient documentation

## 2016-08-15 MED ORDER — METFORMIN HCL 500 MG PO TABS: 500.0000 mg | ORAL_TABLET | Freq: Two times a day (BID) | ORAL | 0 refills | Status: DC

## 2016-08-15 NOTE — Progress Notes (Signed)
Office: [phone]  /  Fax: [phone]   HPI:   Chief Complaint: OBESITY Cheryl Taylor is here to discuss her progress with her obesity treatment plan. She is on the  follow the Category 2 plan and is following her eating plan approximately 0 % of the time. She states she is exercising 0 minutes 0 times per week. Cheryl Taylor continues to do well with weight loss.  She has had increase in emotional eating and is eating out more. She is taking care of her sick husband and has had a challenge planning ahead her meals.    Her weight is 179 lb (81.2 kg) today and has had a weight loss of 2 pounds over a period of 2 weeks since her last visit. She has lost 15 lbs since starting treatment with [place].  Insulin Resistance Cheryl Taylor has a diagnosis of insulin resistance based on her elevated fasting insulin level >5. Although Cheryl Taylor's blood glucose readings are still under good control, insulin resistance puts her at greater risk of metabolic syndrome and diabetes. She has increase in polyphagia in the afternoon. She is taking metformin currently and continues to work on diet and exercise to decrease risk of diabetes.  Hypertension Cheryl Taylor is a 51 y.o. female with hypertension.  Cheryl Taylor denies chest pain or shortness of breath on exertion. She is working weight loss to help control her blood Taylor with the goal of decreasing her risk of heart attack and stroke. Cheryl Taylor is currently controlled.   ALLERGIES: No Known Allergies  MEDICATIONS: Current Outpatient Prescriptions on File Prior to Visit  Medication Sig Dispense Refill  . aspirin 81 MG chewable tablet Chew by mouth daily.    . chlorthalidone (HYGROTON) 25 MG tablet TAKE 1 TABLET DAILY 90 tablet 1  . escitalopram (LEXAPRO) 20 MG tablet TAKE 1 TABLET DAILY 90 tablet 0  . Krill Oil 350 MG CAPS Take 350 mg by mouth every morning.    . Melatonin 10 MG TABS Take 10 mg by mouth at bedtime.    . metoprolol succinate  (TOPROL-XL) 50 MG 24 hr tablet TAKE 1 TABLET DAILY (TAKE WITH OR IMMEDIATELY FOLLOWING A MEAL) 30 tablet 6  . Naproxen-Esomeprazole (VIMOVO) 500-20 MG TBEC Take 500 mg by mouth 2 (two) times daily.    . ranitidine (ZANTAC) 300 MG tablet Take 1 tablet (300 mg total) by mouth at bedtime. 90 tablet 1  . Tryptophan 500 MG TABS Take 500 mg by mouth 2 times daily at 12 noon and 4 pm.    . valACYclovir (VALTREX) 1000 MG tablet TAKE 1 TABLET DAILY 90 tablet 2  . Vitamin D-Vitamin K (D3 + K2 DOTS PO) Take 42 mg by mouth 2 (two) times daily.     No current facility-administered medications on file prior to visit.     PAST MEDICAL HISTORY: Past Medical History:  Diagnosis Date  . Alcohol abuse, in remission   . Anger   . Anxiety   . Back pain   . Chest pain   . Depression   . Drug use   . Fatigue 02/15/2015  . GERD (gastroesophageal reflux disease)   . HTN (hypertension)   . Hyperlipidemia   . Obesity 08/10/2014  . Obesity   . Pain in both feet    arthritis / Hallox Rigidus  . Perimenopausal 11/18/2013  . Raynaud's disease   . Screening for cervical cancer 02/06/2015  . Sleep apnea   . SOB (shortness of breath)   . Vitamin  D deficiency     PAST SURGICAL HISTORY: Past Surgical History:  Procedure Laterality Date  . CESAREAN SECTION  2003  . TONSILLECTOMY  1980    SOCIAL HISTORY: Social History  Substance Use Topics  . Smoking status: Never Smoker  . Smokeless tobacco: Never Used  . Alcohol use 30.0 oz/week    50 Glasses of wine per week     Comment: quit ETOH on 2013    FAMILY HISTORY: Family History  Problem Relation Age of Onset  . Kidney failure Father 64       deceased  . Kidney disease Father   . Obesity Father   . Seizures Father   . Heart disease Father 93  . Hypertension Father   . Hyperlipidemia Father   . Stroke Father   . Sleep apnea Father   . Schizophrenia Brother        30yo  . Alcohol abuse Brother   . Other Brother        chronic pain, 50 yo  .  Stroke Brother   . Alcohol abuse Sister        44yo  . Obesity Paternal Grandfather   . Stroke Sister   . Depression Mother   . Anxiety disorder Mother   . Obesity Mother     ROS: Review of Systems  Constitutional: Positive for weight loss.  Respiratory: Negative for shortness of breath.   Cardiovascular: Negative for chest pain.  Neurological: Negative for headaches.  Endo/Heme/Allergies:       Positive polyphagia    PHYSICAL EXAM: Blood Taylor 113/61, pulse (!) 52, temperature 98.3 F (36.8 C), temperature source Oral, height 5\' 4"  (1.626 m), weight 179 lb (81.2 kg), SpO2 97 %. Body mass index is 30.73 kg/m. Physical Exam  Constitutional: She is oriented to person, place, and time. She appears well-developed and well-nourished.  Cardiovascular: Normal rate.   Pulmonary/Chest: Effort normal.  Musculoskeletal: Normal range of motion.  Neurological: She is alert and oriented to person, place, and time.  Skin: Skin is warm and dry.    RECENT LABS AND TESTS: BMET    Component Value Date/Time   NA 143 05/02/2016 1142   K 3.8 05/02/2016 1142   CL 98 05/02/2016 1142   CO2 27 05/02/2016 1142   GLUCOSE 84 05/02/2016 1142   GLUCOSE 114 (H) 04/12/2016 1558   BUN 14 05/02/2016 1142   CREATININE 0.78 05/02/2016 1142   CREATININE 0.70 11/07/2012 1009   CALCIUM 9.6 05/02/2016 1142   GFRNONAA 87 05/02/2016 1142   GFRAA 100 05/02/2016 1142   Lab Results  Component Value Date   HGBA1C 5.6 05/02/2016   Lab Results  Component Value Date   INSULIN 8.1 05/02/2016   CBC    Component Value Date/Time   WBC 10.1 04/12/2016 1558   RBC 4.68 04/12/2016 1558   HGB 13.0 04/12/2016 1558   HCT 39.1 04/12/2016 1558   PLT 252.0 04/12/2016 1558   MCV 83.4 04/12/2016 1558   MCH 28.3 11/07/2012 1009   MCHC 33.3 04/12/2016 1558   RDW 13.5 04/12/2016 1558   LYMPHSABS 2.8 04/12/2016 1558   MONOABS 0.8 04/12/2016 1558   EOSABS 0.4 04/12/2016 1558   BASOSABS 0.1 04/12/2016 1558    Iron/TIBC/Ferritin/ %Sat No results found for: IRON, TIBC, FERRITIN, IRONPCTSAT Lipid Panel     Component Value Date/Time   CHOL 197 05/02/2016 1142   TRIG 157 (H) 05/02/2016 1142   HDL 40 05/02/2016 1142   CHOLHDL 6 04/12/2016 1558  VLDL 59.0 (H) 04/12/2016 1558   LDLCALC 126 (H) 05/02/2016 1142   LDLDIRECT 130.0 04/12/2016 1558   Hepatic Function Panel     Component Value Date/Time   PROT 7.2 05/02/2016 1142   ALBUMIN 4.3 05/02/2016 1142   AST 27 05/02/2016 1142   ALT 29 05/02/2016 1142   ALKPHOS 65 05/02/2016 1142   BILITOT 0.2 05/02/2016 1142   BILIDIR 0.0 11/15/2013 1504      Component Value Date/Time   TSH 1.15 04/12/2016 1558   TSH 1.65 05/07/2015 1123   TSH 1.85 11/19/2014 1030    ASSESSMENT AND PLAN: Insulin resistance - Plan: metFORMIN (GLUCOPHAGE) 500 MG tablet  Essential hypertension  Class 1 obesity with serious comorbidity and body mass index (BMI) of 30.0 to 30.9 in adult, unspecified obesity type  PLAN:  Insulin Resistance Cheryl Taylor will continue to work on weight loss, exercise, and decreasing simple carbohydrates in her diet to help decrease the risk of diabetes. We dicussed metformin including benefits and risks. She was informed that eating too many simple carbohydrates or too many calories at one sitting increases the likelihood of GI side effects. Cheryl Taylor requested metformin and a prescription was written today. We will increase Metformin to 500 BID to better help with polyphagia. Cheryl Taylor agreed to follow up with [place] as directed to monitor her progress.  Hypertension We discussed sodium restriction, working on healthy weight loss, and a regular exercise program as the means to achieve improved blood Taylor control. Cheryl Taylor agreed with this plan and agreed to follow up as directed. We will continue to monitor her blood Taylor as well as her progress with the above lifestyle modifications. She will continue her medications as prescribed and  will watch for signs of hypotension as she continues her lifestyle modifications.  Obesity Cheryl Taylor is currently in the action stage of change. As such, her goal is to continue with weight loss efforts She has agreed to follow the Category 2 plan Cheryl Taylor has been instructed to work up to a goal of 150 minutes of combined cardio and strengthening exercise per week for weight loss and overall health benefits. We discussed the following Behavioral Modification Stratagies today: increasing lean protein intake and decrease eating out   Cheryl Taylor has agreed to follow up with our clinic in 2 weeks. She was informed of the importance of frequent follow up visits to maximize her success with intensive lifestyle modifications for her multiple health conditions.   Office: [phone]  /  Fax: [phone]  OBESITY BEHAVIORAL INTERVENTION VISIT  Today's visit was # 6 out of 22.  Starting weight: 194 Starting date: 05/02/16 Today's weight : Weight: 179 lb (81.2 kg)  Today's date: 08/18/2016 Total lbs lost to date: 15 (Patients must lose 7 lbs in the first 6 months to continue with counseling)   ASK: We discussed the diagnosis of obesity with Cheryl Taylor today and Cheryl Taylor agreed to give [place] permission to discuss obesity behavioral modification therapy today.  ASSESS: Cheryl Taylor has the diagnosis of obesity and her BMI today is 30.8 Cheryl Taylor is in the action stage of change   ADVISE: Cheryl Taylor was educated on the multiple health risks of obesity as well as the benefit of weight loss to improve her health. She was advised of the need for long term treatment and the importance of lifestyle modifications.  AGREE: Multiple dietary modification options and treatment options were discussed and  Cheryl Taylor agreed to follow the Category 2 plan We discussed the following Behavioral Modification Stratagies today: increasing  lean protein intake and decrease eating out.   I have reviewed the above  documentation for accuracy and completeness, and I agree with the above. -Lacy Duverney, PA-C  I have reviewed the above note and agree with the plan. -Dennard Nip, MD

## 2016-08-24 ENCOUNTER — Other Ambulatory Visit: Payer: Self-pay | Admitting: Family Medicine

## 2016-08-25 NOTE — Telephone Encounter (Signed)
Filling for Dr. Charlett Blake   Requesting Lexapro Contract: NO UDS: No Last OV: 08/15/16  Last Refill: 05/26/16 #[id number]  Please Advise

## 2016-08-29 ENCOUNTER — Ambulatory Visit (INDEPENDENT_AMBULATORY_CARE_PROVIDER_SITE_OTHER): Payer: Managed Care, Other (non HMO) | Admitting: Physician Assistant

## 2016-08-29 VITALS — BP 112/69 | HR 72 | Temp 98.5°F | Ht 64.0 in | Wt 177.0 lb

## 2016-08-29 DIAGNOSIS — E782 Mixed hyperlipidemia: Secondary | ICD-10-CM

## 2016-08-29 DIAGNOSIS — Z6830 Body mass index (BMI) 30.0-30.9, adult: Secondary | ICD-10-CM | POA: Diagnosis not present

## 2016-08-29 DIAGNOSIS — Z9189 Other specified personal risk factors, not elsewhere classified: Secondary | ICD-10-CM

## 2016-08-29 DIAGNOSIS — I10 Essential (primary) hypertension: Secondary | ICD-10-CM | POA: Diagnosis not present

## 2016-08-29 DIAGNOSIS — E8881 Metabolic syndrome: Secondary | ICD-10-CM

## 2016-08-29 DIAGNOSIS — E559 Vitamin D deficiency, unspecified: Secondary | ICD-10-CM

## 2016-08-29 DIAGNOSIS — E669 Obesity, unspecified: Secondary | ICD-10-CM

## 2016-08-29 NOTE — Progress Notes (Signed)
Office: [phone]  /  Fax: [phone]   HPI:   Chief Complaint: OBESITY Cheryl Taylor is here to discuss her progress with her obesity treatment plan. She is on the  follow the Category 2 plan and is following her eating plan approximately 65 % of the time. She states she is exercising 0 minutes 0 times per week. Cheryl Taylor continues to do well with weight loss. She does well with planning meals ahead of time. Cheryl Taylor will be going on vacation for a week and requests eating strategies. Her weight is 177 lb (80.3 kg) today and has had a weight loss of 2 pounds over a period of 2 weeks since her last visit. She has lost 17 lbs since starting treatment with [place].  Vitamin D deficiency Cheryl Taylor has a diagnosis of vitamin D deficiency. She is currently taking vit D-vit K 42 mg bid and she denies nausea, vomiting or muscle weakness.  Insulin Resistance Cheryl Taylor has a diagnosis of insulin resistance based on her elevated fasting insulin level >5. Although Cheryl Taylor's blood glucose readings are still under good control, insulin resistance puts her at greater risk of metabolic syndrome and diabetes. She is taking metformin currently and continues to work on diet and exercise to decrease risk of diabetes.  Hypertension Cheryl Taylor is a 51 y.o. female with hypertension.  Cheryl Taylor denies chest pain or shortness of breath on exertion. She is working weight loss to help control her blood pressure with the goal of decreasing her risk of heart attack and stroke. Cheryl Taylor blood pressure is currently controlled.  Mixed Hyperlipidemia Cheryl Taylor has mixed hyperlipidemia and is not currently on a statin. She has been trying to improve her cholesterol levels with intensive lifestyle modification including a low saturated fat diet, exercise and weight loss. She denies any chest pain, claudication or myalgias.  At risk for cardiovascular disease Cheryl Taylor is at a higher than average risk for cardiovascular  disease due to obesity, hypertension and mixed hyperlipidemia. She currently denies any chest pain.  ALLERGIES: No Known Allergies  MEDICATIONS: Current Outpatient Prescriptions on File Prior to Visit  Medication Sig Dispense Refill  . aspirin 81 MG chewable tablet Chew by mouth daily.    . chlorthalidone (HYGROTON) 25 MG tablet TAKE 1 TABLET DAILY 90 tablet 1  . escitalopram (LEXAPRO) 20 MG tablet TAKE 1 TABLET DAILY 90 tablet 0  . Krill Oil 350 MG CAPS Take 350 mg by mouth every morning.    . Melatonin 10 MG TABS Take 10 mg by mouth at bedtime.    . metFORMIN (GLUCOPHAGE) 500 MG tablet Take 1 tablet (500 mg total) by mouth 2 (two) times daily with a meal. 60 tablet 0  . metoprolol succinate (TOPROL-XL) 50 MG 24 hr tablet TAKE 1 TABLET DAILY (TAKE WITH OR IMMEDIATELY FOLLOWING A MEAL) 30 tablet 6  . Naproxen-Esomeprazole (VIMOVO) 500-20 MG TBEC Take 500 mg by mouth 2 (two) times daily.    . ranitidine (ZANTAC) 300 MG tablet Take 1 tablet (300 mg total) by mouth at bedtime. 90 tablet 1  . Tryptophan 500 MG TABS Take 500 mg by mouth 2 times daily at 12 noon and 4 pm.    . valACYclovir (VALTREX) 1000 MG tablet TAKE 1 TABLET DAILY 90 tablet 2  . Vitamin D-Vitamin K (D3 + K2 DOTS PO) Take 42 mg by mouth 2 (two) times daily.     No current facility-administered medications on file prior to visit.     PAST MEDICAL HISTORY: Past Medical  History:  Diagnosis Date  . Alcohol abuse, in remission   . Anger   . Anxiety   . Back pain   . Chest pain   . Depression   . Drug use   . Fatigue 02/15/2015  . GERD (gastroesophageal reflux disease)   . HTN (hypertension)   . Hyperlipidemia   . Obesity 08/10/2014  . Obesity   . Pain in both feet    arthritis / Hallox Rigidus  . Perimenopausal 11/18/2013  . Raynaud's disease   . Screening for cervical cancer 02/06/2015  . Sleep apnea   . SOB (shortness of breath)   . Vitamin D deficiency     PAST SURGICAL HISTORY: Past Surgical History:    Procedure Laterality Date  . CESAREAN SECTION  2003  . TONSILLECTOMY  1980    SOCIAL HISTORY: Social History  Substance Use Topics  . Smoking status: Never Smoker  . Smokeless tobacco: Never Used  . Alcohol use 30.0 oz/week    50 Glasses of wine per week     Comment: quit ETOH on 2013    FAMILY HISTORY: Family History  Problem Relation Age of Onset  . Kidney failure Father 22       deceased  . Kidney disease Father   . Obesity Father   . Seizures Father   . Heart disease Father 36  . Hypertension Father   . Hyperlipidemia Father   . Stroke Father   . Sleep apnea Father   . Schizophrenia Brother        53yo  . Alcohol abuse Brother   . Other Brother        chronic pain, 74 yo  . Stroke Brother   . Alcohol abuse Sister        53yo  . Obesity Paternal Grandfather   . Stroke Sister   . Depression Mother   . Anxiety disorder Mother   . Obesity Mother     ROS: Review of Systems  Constitutional: Positive for weight loss.  Respiratory: Negative for shortness of breath (on exertion).   Cardiovascular: Negative for chest pain and claudication.  Gastrointestinal: Negative for nausea and vomiting.  Musculoskeletal: Negative for myalgias.       Negative muscle weakness    PHYSICAL EXAM: Blood pressure 112/69, pulse 72, temperature 98.5 F (36.9 C), temperature source Oral, height 5\' 4"  (1.626 m), weight 177 lb (80.3 kg), SpO2 96 %. Body mass index is 30.38 kg/m. Physical Exam  Constitutional: She is oriented to person, place, and time. She appears well-developed and well-nourished.  Cardiovascular: Normal rate.   Pulmonary/Chest: Effort normal.  Musculoskeletal: Normal range of motion.  Neurological: She is oriented to person, place, and time.  Skin: Skin is warm and dry.  Psychiatric: She has a normal mood and affect. Her behavior is normal.  Vitals reviewed.   RECENT LABS AND TESTS: BMET    Component Value Date/Time   NA 143 05/02/2016 1142   K 3.8  05/02/2016 1142   CL 98 05/02/2016 1142   CO2 27 05/02/2016 1142   GLUCOSE 84 05/02/2016 1142   GLUCOSE 114 (H) 04/12/2016 1558   BUN 14 05/02/2016 1142   CREATININE 0.78 05/02/2016 1142   CREATININE 0.70 11/07/2012 1009   CALCIUM 9.6 05/02/2016 1142   GFRNONAA 87 05/02/2016 1142   GFRAA 100 05/02/2016 1142   Lab Results  Component Value Date   HGBA1C 5.6 05/02/2016   Lab Results  Component Value Date   INSULIN 8.1 05/02/2016  CBC    Component Value Date/Time   WBC 10.1 04/12/2016 1558   RBC 4.68 04/12/2016 1558   HGB 13.0 04/12/2016 1558   HCT 39.1 04/12/2016 1558   PLT 252.0 04/12/2016 1558   MCV 83.4 04/12/2016 1558   MCH 28.3 11/07/2012 1009   MCHC 33.3 04/12/2016 1558   RDW 13.5 04/12/2016 1558   LYMPHSABS 2.8 04/12/2016 1558   MONOABS 0.8 04/12/2016 1558   EOSABS 0.4 04/12/2016 1558   BASOSABS 0.1 04/12/2016 1558   Iron/TIBC/Ferritin/ %Sat No results found for: IRON, TIBC, FERRITIN, IRONPCTSAT Lipid Panel     Component Value Date/Time   CHOL 197 05/02/2016 1142   TRIG 157 (H) 05/02/2016 1142   HDL 40 05/02/2016 1142   CHOLHDL 6 04/12/2016 1558   VLDL 59.0 (H) 04/12/2016 1558   LDLCALC 126 (H) 05/02/2016 1142   LDLDIRECT 130.0 04/12/2016 1558   Hepatic Function Panel     Component Value Date/Time   PROT 7.2 05/02/2016 1142   ALBUMIN 4.3 05/02/2016 1142   AST 27 05/02/2016 1142   ALT 29 05/02/2016 1142   ALKPHOS 65 05/02/2016 1142   BILITOT 0.2 05/02/2016 1142   BILIDIR 0.0 11/15/2013 1504      Component Value Date/Time   TSH 1.15 04/12/2016 1558   TSH 1.65 05/07/2015 1123   TSH 1.85 11/19/2014 1030    ASSESSMENT AND PLAN: Essential hypertension - Plan: Comprehensive metabolic panel  Mixed hyperlipidemia - Plan: Lipid Panel With LDL/HDL Ratio  Insulin resistance - Plan: Hemoglobin A1c, Insulin, random  Vitamin D deficiency - Plan: VITAMIN D 25 Hydroxy (Vit-D Deficiency, Fractures)  At risk for heart disease  Class 1 obesity with  serious comorbidity and body mass index (BMI) of 30.0 to 30.9 in adult, unspecified obesity type  PLAN:  Vitamin D Deficiency Cheryl Taylor was informed that low vitamin D levels contributes to fatigue and are associated with obesity, breast, and colon cancer. She agrees to continue to take vit D-vit K 42 mg bid and we will check labs and will follow up for routine testing of vitamin D, at least 2-3 times per year. She was informed of the risk of over-replacement of vitamin D and agrees to not increase her dose unless he discusses this with [place] first.  Insulin Resistance Cheryl Taylor will continue to work on weight loss, exercise, and decreasing simple carbohydrates in her diet to help decrease the risk of diabetes. We dicussed metformin including benefits and risks. She was informed that eating too many simple carbohydrates or too many calories at one sitting increases the likelihood of GI side effects. Cheryl Taylor agrees to continue metformin for now and prescription was not written today. We will check labs and Cheryl Taylor agreed to follow up with [place] as directed to monitor her progress.  Hypertension We discussed sodium restriction, working on healthy weight loss, and a regular exercise program as the means to achieve improved blood pressure control. Cheryl Taylor agreed with this plan and agreed to follow up as directed. We will check labs and continue to monitor her blood pressure as well as her progress with the above lifestyle modifications. She will continue her medications as prescribed and will watch for signs of hypotension as she continues her lifestyle modifications.  Mixed Hyperlipidemia Cheryl Taylor was informed of the American Heart Association Guidelines emphasizing intensive lifestyle modifications as the first line treatment for hyperlipidemia. We discussed many lifestyle modifications today in depth, and Cheryl Taylor will continue to work on decreasing saturated fats such as fatty red meat, butter  and many  fried foods. She will also increase vegetables and lean protein in her diet and continue to work on exercise and weight loss efforts. We will check labs and Cheryl Taylor agrees to follow up with our clinic in 2 weeks.  Cardiovascular risk counselling Cheryl Taylor was given extended (15 minutes) coronary artery disease prevention counseling today. She is 51 y.o. female and has risk factors for heart disease including obesity, hypertension and mixed hyperlipidemia. We discussed intensive lifestyle modifications today with an emphasis on specific weight loss instructions and strategies. Pt was also informed of the importance of increasing exercise and decreasing saturated fats to help prevent heart disease.  Obesity Cheryl Taylor is currently in the action stage of change. As such, her goal is to continue with weight loss efforts She has agreed to follow the Category 2 plan Cheryl Taylor has been instructed to work up to a goal of 150 minutes of combined cardio and strengthening exercise per week for weight loss and overall health benefits. We discussed the following Behavioral Modification Strategies today: increasing lean protein intake and travel eating strategies  Mina has agreed to follow up with our clinic in 2 weeks. She was informed of the importance of frequent follow up visits to maximize her success with intensive lifestyle modifications for her multiple health conditions.  I, Cheryl Taylor, am acting as transcriptionist for Lacy Duverney, PA-C  I have reviewed the above documentation for accuracy and completeness, and I agree with the above. -Lacy Duverney, PA-C  I have reviewed the above note and agree with the plan. -Dennard Nip, MD   OBESITY BEHAVIORAL INTERVENTION VISIT  Today's visit was # 7 out of 22.  Starting weight: 194 lbs Starting date: 05/02/16 Today's weight : 177 lbs Today's date: 08/29/2016 Total lbs lost to date: 17 (Patients must lose 7 lbs in the first 6 months to continue with  counseling)   ASK: We discussed the diagnosis of obesity with Cheryl Taylor today and Cheryl Taylor agreed to give [place] permission to discuss obesity behavioral modification therapy today.  ASSESS: Cheryl Taylor has the diagnosis of obesity and her BMI today is 30.4 Elinora is in the action stage of change   ADVISE: Lailie was educated on the multiple health risks of obesity as well as the benefit of weight loss to improve her health. She was advised of the need for long term treatment and the importance of lifestyle modifications.  AGREE: Multiple dietary modification options and treatment options were discussed and  Anni agreed to follow the Category 2 plan We discussed the following Behavioral Modification Strategies today: increasing lean protein intake and travel eating strategies

## 2016-08-31 LAB — LIPID PANEL WITH LDL/HDL RATIO
Cholesterol, Total: 190 mg/dL (ref 100–199)
HDL: 47 mg/dL (ref >39)
LDL Calculated: 123 mg/dL — ABNORMAL HIGH (ref 0–99)
LDl/HDL Ratio: 2.6 ratio (ref 0.0–3.2)
Triglycerides: 102 mg/dL (ref 0–149)
VLDL Cholesterol Cal: 20 mg/dL (ref 5–40)

## 2016-08-31 LAB — COMPREHENSIVE METABOLIC PANEL
ALT: 22 IU/L (ref 0–32)
AST: 21 IU/L (ref 0–40)
Albumin/Globulin Ratio: 1.8 (ref 1.2–2.2)
Albumin: 4.6 g/dL (ref 3.5–5.5)
Alkaline Phosphatase: 65 IU/L (ref 39–117)
BUN/Creatinine Ratio: 28 — ABNORMAL HIGH (ref 9–23)
BUN: 20 mg/dL (ref 6–24)
Bilirubin Total: <0.2 mg/dL (ref 0.0–1.2)
CO2: 24 mmol/L (ref 20–29)
Calcium: 9.8 mg/dL (ref 8.7–10.2)
Chloride: 98 mmol/L (ref 96–106)
Creatinine, Ser: 0.71 mg/dL (ref 0.57–1.00)
GFR calc Af Amer: 112 mL/min/{1.73_m2} (ref >59)
GFR calc non Af Amer: 98 mL/min/{1.73_m2} (ref >59)
Globulin, Total: 2.6 g/dL (ref 1.5–4.5)
Glucose: 88 mg/dL (ref 65–99)
Potassium: 3.7 mmol/L (ref 3.5–5.2)
Sodium: 139 mmol/L (ref 134–144)
Total Protein: 7.2 g/dL (ref 6.0–8.5)

## 2016-08-31 LAB — VITAMIN D 25 HYDROXY (VIT D DEFICIENCY, FRACTURES): Vit D, 25-Hydroxy: 55.3 ng/mL (ref 30.0–100.0)

## 2016-08-31 LAB — HEMOGLOBIN A1C
Est. average glucose Bld gHb Est-mCnc: 111 mg/dL
Hgb A1c MFr Bld: 5.5 % (ref 4.8–5.6)

## 2016-08-31 LAB — INSULIN, RANDOM: INSULIN: 8.8 u[IU]/mL (ref 2.6–24.9)

## 2016-09-12 ENCOUNTER — Ambulatory Visit (INDEPENDENT_AMBULATORY_CARE_PROVIDER_SITE_OTHER): Payer: Managed Care, Other (non HMO) | Admitting: Physician Assistant

## 2016-09-12 VITALS — BP 121/78 | HR 68 | Temp 98.7°F | Ht 64.0 in | Wt 180.0 lb

## 2016-09-12 DIAGNOSIS — E669 Obesity, unspecified: Secondary | ICD-10-CM | POA: Insufficient documentation

## 2016-09-12 DIAGNOSIS — Z6831 Body mass index (BMI) 31.0-31.9, adult: Secondary | ICD-10-CM

## 2016-09-12 DIAGNOSIS — Z9189 Other specified personal risk factors, not elsewhere classified: Secondary | ICD-10-CM | POA: Diagnosis not present

## 2016-09-12 DIAGNOSIS — E782 Mixed hyperlipidemia: Secondary | ICD-10-CM | POA: Diagnosis not present

## 2016-09-12 DIAGNOSIS — E8881 Metabolic syndrome: Secondary | ICD-10-CM

## 2016-09-12 MED ORDER — METFORMIN HCL 500 MG PO TABS: 500.0000 mg | ORAL_TABLET | Freq: Two times a day (BID) | ORAL | 0 refills | Status: DC

## 2016-09-12 MED ORDER — ATORVASTATIN CALCIUM 10 MG PO TABS: 10.0000 mg | ORAL_TABLET | Freq: Every day | ORAL | 0 refills | Status: DC

## 2016-09-12 NOTE — Progress Notes (Signed)
Office: [phone]  /  Fax: [phone]   HPI:   Chief Complaint: OBESITY Cheryl Taylor is here to discuss her progress with her obesity treatment plan. She is on the Category 2 plan and is following her eating plan approximately 50 % of the time. She states she is exercising 0 minutes 0 times per week. Cheryl Taylor was on vacation and had a harder time planning ahead and ate out more. She is ready to get back on track. Her weight is 180 lb (81.6 kg) today and has had a weight gain of 3 pounds over a period of 2 weeks since her last visit. She has lost 14 lbs since starting treatment with [place].  Mixed Hyperlipidemia Cheryl Taylor has hyperlipidemia, repeat LDL was >100. She has a family history of stroke in both siblings. She has been trying to improve her cholesterol levels with intensive lifestyle modification including a low saturated fat diet, exercise and weight loss. She denies any chest pain, claudication or myalgias.  Insulin Resistance Cheryl Taylor has a diagnosis of insulin resistance based on her elevated fasting insulin level >5. Although Cheryl Taylor's blood glucose readings are still under good control, insulin resistance puts her at greater risk of metabolic syndrome and diabetes. She is taking metformin currently and continues to work on diet and exercise to decrease risk of diabetes. She denies polyphagia.  At risk for cardiovascular disease Cheryl Taylor is at a higher than average risk for cardiovascular disease due to obesity and insulin resistance. She currently denies any chest pain.   ALLERGIES: No Known Allergies  MEDICATIONS: Current Outpatient Prescriptions on File Prior to Visit  Medication Sig Dispense Refill  . aspirin 81 MG chewable tablet Chew by mouth daily.    . chlorthalidone (HYGROTON) 25 MG tablet TAKE 1 TABLET DAILY 90 tablet 1  . escitalopram (LEXAPRO) 20 MG tablet TAKE 1 TABLET DAILY 90 tablet 0  . Krill Oil 350 MG CAPS Take 350 mg by mouth every morning.    . Melatonin  10 MG TABS Take 10 mg by mouth at bedtime.    . metoprolol succinate (TOPROL-XL) 50 MG 24 hr tablet TAKE 1 TABLET DAILY (TAKE WITH OR IMMEDIATELY FOLLOWING A MEAL) 30 tablet 6  . Naproxen-Esomeprazole (VIMOVO) 500-20 MG TBEC Take 500 mg by mouth 2 (two) times daily.    . ranitidine (ZANTAC) 300 MG tablet Take 1 tablet (300 mg total) by mouth at bedtime. 90 tablet 1  . Tryptophan 500 MG TABS Take 500 mg by mouth 2 times daily at 12 noon and 4 pm.    . valACYclovir (VALTREX) 1000 MG tablet TAKE 1 TABLET DAILY 90 tablet 2  . Vitamin D-Vitamin K (D3 + K2 DOTS PO) Take 42 mg by mouth 2 (two) times daily.     No current facility-administered medications on file prior to visit.     PAST MEDICAL HISTORY: Past Medical History:  Diagnosis Date  . Alcohol abuse, in remission   . Anger   . Anxiety   . Back pain   . Chest pain   . Depression   . Drug use   . Fatigue 02/15/2015  . GERD (gastroesophageal reflux disease)   . HTN (hypertension)   . Hyperlipidemia   . Obesity 08/10/2014  . Obesity   . Pain in both feet    arthritis / Hallox Rigidus  . Perimenopausal 11/18/2013  . Raynaud's disease   . Screening for cervical cancer 02/06/2015  . Sleep apnea   . SOB (shortness of breath)   .  Vitamin D deficiency     PAST SURGICAL HISTORY: Past Surgical History:  Procedure Laterality Date  . CESAREAN SECTION  2003  . TONSILLECTOMY  1980    SOCIAL HISTORY: Social History  Substance Use Topics  . Smoking status: Never Smoker  . Smokeless tobacco: Never Used  . Alcohol use 30.0 oz/week    50 Glasses of wine per week     Comment: quit ETOH on 2013    FAMILY HISTORY: Family History  Problem Relation Age of Onset  . Kidney failure Father 60       deceased  . Kidney disease Father   . Obesity Father   . Seizures Father   . Heart disease Father 2  . Hypertension Father   . Hyperlipidemia Father   . Stroke Father   . Sleep apnea Father   . Schizophrenia Brother        19yo  .  Alcohol abuse Brother   . Other Brother        chronic pain, 32 yo  . Stroke Brother   . Alcohol abuse Sister        58yo  . Obesity Paternal Grandfather   . Stroke Sister   . Depression Mother   . Anxiety disorder Mother   . Obesity Mother     ROS: Review of Systems  Constitutional: Negative for weight loss.  Cardiovascular: Negative for chest pain and claudication.  Musculoskeletal: Negative for myalgias.  Endo/Heme/Allergies:       Negative polyphagia    PHYSICAL EXAM: Blood pressure 121/78, pulse 68, temperature 98.7 F (37.1 C), temperature source Oral, height 5\' 4"  (1.626 m), weight 180 lb (81.6 kg), SpO2 99 %. Body mass index is 30.9 kg/m. Physical Exam  Constitutional: She is oriented to person, place, and time. She appears well-developed and well-nourished.  Cardiovascular: Normal rate.   Pulmonary/Chest: Effort normal.  Musculoskeletal: Normal range of motion.  Neurological: She is oriented to person, place, and time.  Skin: Skin is warm and dry.  Psychiatric: She has a normal mood and affect. Her behavior is normal.  Vitals reviewed.   RECENT LABS AND TESTS: BMET    Component Value Date/Time   NA 139 08/29/2016 1110   K 3.7 08/29/2016 1110   CL 98 08/29/2016 1110   CO2 24 08/29/2016 1110   GLUCOSE 88 08/29/2016 1110   GLUCOSE 114 (H) 04/12/2016 1558   BUN 20 08/29/2016 1110   CREATININE 0.71 08/29/2016 1110   CREATININE 0.70 11/07/2012 1009   CALCIUM 9.8 08/29/2016 1110   GFRNONAA 98 08/29/2016 1110   GFRAA 112 08/29/2016 1110   Lab Results  Component Value Date   HGBA1C 5.5 08/29/2016   HGBA1C 5.6 05/02/2016   Lab Results  Component Value Date   INSULIN 8.8 08/29/2016   INSULIN 8.1 05/02/2016   CBC    Component Value Date/Time   WBC 10.1 04/12/2016 1558   RBC 4.68 04/12/2016 1558   HGB 13.0 04/12/2016 1558   HCT 39.1 04/12/2016 1558   PLT 252.0 04/12/2016 1558   MCV 83.4 04/12/2016 1558   MCH 28.3 11/07/2012 1009   MCHC 33.3  04/12/2016 1558   RDW 13.5 04/12/2016 1558   LYMPHSABS 2.8 04/12/2016 1558   MONOABS 0.8 04/12/2016 1558   EOSABS 0.4 04/12/2016 1558   BASOSABS 0.1 04/12/2016 1558   Iron/TIBC/Ferritin/ %Sat No results found for: IRON, TIBC, FERRITIN, IRONPCTSAT Lipid Panel     Component Value Date/Time   CHOL 190 08/29/2016 1110  TRIG 102 08/29/2016 1110   HDL 47 08/29/2016 1110   CHOLHDL 6 04/12/2016 1558   VLDL 59.0 (H) 04/12/2016 1558   LDLCALC 123 (H) 08/29/2016 1110   LDLDIRECT 130.0 04/12/2016 1558   Hepatic Function Panel     Component Value Date/Time   PROT 7.2 08/29/2016 1110   ALBUMIN 4.6 08/29/2016 1110   AST 21 08/29/2016 1110   ALT 22 08/29/2016 1110   ALKPHOS 65 08/29/2016 1110   BILITOT <0.2 08/29/2016 1110   BILIDIR 0.0 11/15/2013 1504      Component Value Date/Time   TSH 1.15 04/12/2016 1558   TSH 1.65 05/07/2015 1123   TSH 1.85 11/19/2014 1030    ASSESSMENT AND PLAN: Mixed hyperlipidemia - Plan: atorvastatin (LIPITOR) 10 MG tablet  Insulin resistance - Plan: metFORMIN (GLUCOPHAGE) 500 MG tablet  At risk for heart disease  Class 1 obesity with serious comorbidity and body mass index (BMI) of 31.0 to 31.9 in adult, unspecified obesity type  PLAN:  Mixed Hyperlipidemia Cheryl Taylor was informed of the American Heart Association Guidelines emphasizing intensive lifestyle modifications as the first line treatment for hyperlipidemia. We discussed many lifestyle modifications today in depth, and Cheryl Taylor will continue to work on decreasing saturated fats such as fatty red meat, butter and many fried foods. She will also increase vegetables and lean protein in her diet and continue to work on exercise and weight loss efforts. Cheryl Taylor agrees to start Lipitor 10 mg qd #30 with no refills and will follow up with our clinic in 2 weeks.  Insulin Resistance Cheryl Taylor will continue to work on weight loss, exercise, and decreasing simple carbohydrates in her diet to help decrease  the risk of diabetes. We dicussed metformin including benefits and risks. She was informed that eating too many simple carbohydrates or too many calories at one sitting increases the likelihood of GI side effects. Cheryl Taylor agrees to continue to take metformin for now and prescription was written today for metformin 500 mg bid #60 with no refills. Cheryl Taylor agreed to follow up with [place] as directed to monitor her progress.  Cardiovascular risk counselling Cheryl Taylor was given extended (15 minutes) coronary artery disease prevention counseling today. She is 51 y.o. female and has risk factors for heart disease including obesity and insulin resistance. We discussed intensive lifestyle modifications today with an emphasis on specific weight loss instructions and strategies. Pt was also informed of the importance of increasing exercise and decreasing saturated fats to help prevent heart disease.  Obesity Cheryl Taylor is currently in the action stage of change. As such, her goal is to continue with weight loss efforts She has agreed to follow the Category 2 plan Cheryl Taylor has been instructed to work up to a goal of 150 minutes of combined cardio and strengthening exercise per week for weight loss and overall health benefits. We discussed the following Behavioral Modification Strategies today: increasing lean protein intake and emotional eating strategies  Joyice has agreed to follow up with our clinic in 2 weeks. She was informed of the importance of frequent follow up visits to maximize her success with intensive lifestyle modifications for her multiple health conditions.  I, Doreene Nest, am acting as transcriptionist for Lacy Duverney, PA-C  I have reviewed the above documentation for accuracy and completeness, and I agree with the above. -Lacy Duverney, PA-C  I have reviewed the above note and agree with the plan. -Dennard Nip, MD  OBESITY BEHAVIORAL INTERVENTION VISIT  Today's visit was # 8 out of  22.  Starting weight: 194 lbs Starting date: 05/02/16 Today's weight : 180 lbs Today's date: 09/12/2016 Total lbs lost to date: 14 (Patients must lose 7 lbs in the first 6 months to continue with counseling)   ASK: We discussed the diagnosis of obesity with Cheryl Taylor today and Jadon agreed to give [place] permission to discuss obesity behavioral modification therapy today.  ASSESS: Layia has the diagnosis of obesity and her BMI today is @31  Keiaira is in the action stage of change   ADVISE: Teniya was educated on the multiple health risks of obesity as well as the benefit of weight loss to improve her health. She was advised of the need for long term treatment and the importance of lifestyle modifications.  AGREE: Multiple dietary modification options and treatment options were discussed and  Chiante agreed to follow the Category 2 plan We discussed the following Behavioral Modification Strategies today: increasing lean protein intake and emotional eating strategies

## 2016-09-21 NOTE — Telephone Encounter (Signed)
Message on patients phone says her mailbox is not set up yet

## 2016-09-26 ENCOUNTER — Ambulatory Visit (INDEPENDENT_AMBULATORY_CARE_PROVIDER_SITE_OTHER): Payer: Managed Care, Other (non HMO) | Admitting: Physician Assistant

## 2016-09-26 VITALS — BP 123/76 | HR 67 | Temp 98.1°F | Ht 64.0 in | Wt 181.0 lb

## 2016-09-26 DIAGNOSIS — Z6831 Body mass index (BMI) 31.0-31.9, adult: Secondary | ICD-10-CM | POA: Diagnosis not present

## 2016-09-26 DIAGNOSIS — E669 Obesity, unspecified: Secondary | ICD-10-CM | POA: Diagnosis not present

## 2016-09-26 DIAGNOSIS — E785 Hyperlipidemia, unspecified: Secondary | ICD-10-CM

## 2016-09-27 NOTE — Progress Notes (Signed)
Office: [phone]  /  Fax: [phone]   HPI:   Chief Complaint: OBESITY Cheryl Taylor is here to discuss her progress with her obesity treatment plan. She is on the Category 2 plan and is following her eating plan approximately 50 % of the time. She states she is exercising 0 minutes 0 times per week. Cheryl Taylor has increased stress at work and has more emotional eating. She is controlling her portions and making smarter food choices. Her weight is 181 lb (82.1 kg) today and has had a weight gain of 1 pound over a period of 2 weeks since her last visit. She has lost 13 lbs since starting treatment with [place].  Hyperlipidemia Cheryl Taylor has hyperlipidemia and she has been trying to improve her cholesterol levels with intensive lifestyle modification including a low saturated fat diet, exercise and weight loss. She started Lipitor and tolerated this well. She denies any chest pain, claudication or myalgias.   ALLERGIES: No Known Allergies  MEDICATIONS: Current Outpatient Prescriptions on File Prior to Visit  Medication Sig Dispense Refill  . aspirin 81 MG chewable tablet Chew by mouth daily.    Marland Kitchen atorvastatin (LIPITOR) 10 MG tablet Take 1 tablet (10 mg total) by mouth daily. 30 tablet 0  . chlorthalidone (HYGROTON) 25 MG tablet TAKE 1 TABLET DAILY 90 tablet 1  . escitalopram (LEXAPRO) 20 MG tablet TAKE 1 TABLET DAILY 90 tablet 0  . Krill Oil 350 MG CAPS Take 350 mg by mouth every morning.    . Melatonin 10 MG TABS Take 10 mg by mouth at bedtime.    . metFORMIN (GLUCOPHAGE) 500 MG tablet Take 1 tablet (500 mg total) by mouth 2 (two) times daily with a meal. 60 tablet 0  . metoprolol succinate (TOPROL-XL) 50 MG 24 hr tablet TAKE 1 TABLET DAILY (TAKE WITH OR IMMEDIATELY FOLLOWING A MEAL) 30 tablet 6  . Naproxen-Esomeprazole (VIMOVO) 500-20 MG TBEC Take 500 mg by mouth 2 (two) times daily.    . ranitidine (ZANTAC) 300 MG tablet Take 1 tablet (300 mg total) by mouth at bedtime. 90 tablet 1  .  Tryptophan 500 MG TABS Take 500 mg by mouth 2 times daily at 12 noon and 4 pm.    . valACYclovir (VALTREX) 1000 MG tablet TAKE 1 TABLET DAILY 90 tablet 2  . Vitamin D-Vitamin K (D3 + K2 DOTS PO) Take 42 mg by mouth 2 (two) times daily.     No current facility-administered medications on file prior to visit.     PAST MEDICAL HISTORY: Past Medical History:  Diagnosis Date  . Alcohol abuse, in remission   . Anger   . Anxiety   . Back pain   . Chest pain   . Depression   . Drug use   . Fatigue 02/15/2015  . GERD (gastroesophageal reflux disease)   . HTN (hypertension)   . Hyperlipidemia   . Obesity 08/10/2014  . Obesity   . Pain in both feet    arthritis / Hallox Rigidus  . Perimenopausal 11/18/2013  . Raynaud's disease   . Screening for cervical cancer 02/06/2015  . Sleep apnea   . SOB (shortness of breath)   . Vitamin D deficiency     PAST SURGICAL HISTORY: Past Surgical History:  Procedure Laterality Date  . CESAREAN SECTION  2003  . TONSILLECTOMY  1980    SOCIAL HISTORY: Social History  Substance Use Topics  . Smoking status: Never Smoker  . Smokeless tobacco: Never Used  . Alcohol  use 30.0 oz/week    50 Glasses of wine per week     Comment: quit ETOH on 2013    FAMILY HISTORY: Family History  Problem Relation Age of Onset  . Kidney failure Father 63       deceased  . Kidney disease Father   . Obesity Father   . Seizures Father   . Heart disease Father 78  . Hypertension Father   . Hyperlipidemia Father   . Stroke Father   . Sleep apnea Father   . Schizophrenia Brother        68yo  . Alcohol abuse Brother   . Other Brother        chronic pain, 71 yo  . Stroke Brother   . Alcohol abuse Sister        45yo  . Obesity Paternal Grandfather   . Stroke Sister   . Depression Mother   . Anxiety disorder Mother   . Obesity Mother     ROS: Review of Systems  Constitutional: Negative for weight loss.  Cardiovascular: Negative for chest pain and  claudication.  Musculoskeletal: Negative for myalgias.    PHYSICAL EXAM: Blood pressure 123/76, pulse 67, temperature 98.1 F (36.7 C), temperature source Oral, height 5\' 4"  (1.626 m), weight 181 lb (82.1 kg), last menstrual period 09/25/2016, SpO2 97 %. Body mass index is 31.07 kg/m. Physical Exam  Constitutional: She is oriented to person, place, and time. She appears well-developed and well-nourished.  Cardiovascular: Normal rate.   Pulmonary/Chest: Effort normal.  Musculoskeletal: Normal range of motion.  Neurological: She is oriented to person, place, and time.  Skin: Skin is warm and dry.  Psychiatric: She has a normal mood and affect. Her behavior is normal.  Vitals reviewed.   RECENT LABS AND TESTS: BMET    Component Value Date/Time   NA 139 08/29/2016 1110   K 3.7 08/29/2016 1110   CL 98 08/29/2016 1110   CO2 24 08/29/2016 1110   GLUCOSE 88 08/29/2016 1110   GLUCOSE 114 (H) 04/12/2016 1558   BUN 20 08/29/2016 1110   CREATININE 0.71 08/29/2016 1110   CREATININE 0.70 11/07/2012 1009   CALCIUM 9.8 08/29/2016 1110   GFRNONAA 98 08/29/2016 1110   GFRAA 112 08/29/2016 1110   Lab Results  Component Value Date   HGBA1C 5.5 08/29/2016   HGBA1C 5.6 05/02/2016   Lab Results  Component Value Date   INSULIN 8.8 08/29/2016   INSULIN 8.1 05/02/2016   CBC    Component Value Date/Time   WBC 10.1 04/12/2016 1558   RBC 4.68 04/12/2016 1558   HGB 13.0 04/12/2016 1558   HCT 39.1 04/12/2016 1558   PLT 252.0 04/12/2016 1558   MCV 83.4 04/12/2016 1558   MCH 28.3 11/07/2012 1009   MCHC 33.3 04/12/2016 1558   RDW 13.5 04/12/2016 1558   LYMPHSABS 2.8 04/12/2016 1558   MONOABS 0.8 04/12/2016 1558   EOSABS 0.4 04/12/2016 1558   BASOSABS 0.1 04/12/2016 1558   Iron/TIBC/Ferritin/ %Sat No results found for: IRON, TIBC, FERRITIN, IRONPCTSAT Lipid Panel     Component Value Date/Time   CHOL 190 08/29/2016 1110   TRIG 102 08/29/2016 1110   HDL 47 08/29/2016 1110   CHOLHDL  6 04/12/2016 1558   VLDL 59.0 (H) 04/12/2016 1558   LDLCALC 123 (H) 08/29/2016 1110   LDLDIRECT 130.0 04/12/2016 1558   Hepatic Function Panel     Component Value Date/Time   PROT 7.2 08/29/2016 1110   ALBUMIN 4.6 08/29/2016 1110  AST 21 08/29/2016 1110   ALT 22 08/29/2016 1110   ALKPHOS 65 08/29/2016 1110   BILITOT <0.2 08/29/2016 1110   BILIDIR 0.0 11/15/2013 1504      Component Value Date/Time   TSH 1.15 04/12/2016 1558   TSH 1.65 05/07/2015 1123   TSH 1.85 11/19/2014 1030    ASSESSMENT AND PLAN: Hyperlipidemia, unspecified hyperlipidemia type  Class 1 obesity with serious comorbidity and body mass index (BMI) of 31.0 to 31.9 in adult, unspecified obesity type  PLAN:  Hyperlipidemia Milayah was informed of the American Heart Association Guidelines emphasizing intensive lifestyle modifications as the first line treatment for hyperlipidemia. We discussed many lifestyle modifications today in depth, and Aylee will continue to work on decreasing saturated fats such as fatty red meat, butter and many fried foods. She will also increase vegetables and lean protein in her diet and continue to work on exercise and weight loss efforts. Blayke agrees to continue Lipitor and will follow up with our clinic in 2 weeks.  We spent > than 50% of the 15 minute visit on the counseling as documented in the note.  Obesity Demoni is currently in the action stage of change. As such, her goal is to continue with weight loss efforts She has agreed to follow the Category 2 plan Licet has been instructed to work up to a goal of 150 minutes of combined cardio and strengthening exercise per week for weight loss and overall health benefits. We discussed the following Behavioral Modification Strategies today: increasing lean protein intake and keeping healthy foods in the home  Farrell has agreed to follow up with our clinic in 2 weeks. She was informed of the importance of frequent  follow up visits to maximize her success with intensive lifestyle modifications for her multiple health conditions.  I, Doreene Nest, am acting as transcriptionist for Lacy Duverney, PA-C  I have reviewed the above documentation for accuracy and completeness, and I agree with the above. -Lacy Duverney, PA-C  I have reviewed the above note and agree with the plan. -Dennard Nip, MD   OBESITY BEHAVIORAL INTERVENTION VISIT  Today's visit was # 9 out of 22.  Starting weight: 194 lbs Starting date: 05/02/16 Today's weight : 181 lbs Today's date: 09/26/2016 Total lbs lost to date: 13 (Patients must lose 7 lbs in the first 6 months to continue with counseling)   ASK: We discussed the diagnosis of obesity with Lolita Patella today and Thatiana agreed to give [place] permission to discuss obesity behavioral modification therapy today.  ASSESS: Shaliah has the diagnosis of obesity and her BMI today is 31.05 Jamillah is in the action stage of change   ADVISE: Addalyne was educated on the multiple health risks of obesity as well as the benefit of weight loss to improve her health. She was advised of the need for long term treatment and the importance of lifestyle modifications.  AGREE: Multiple dietary modification options and treatment options were discussed and  Luanne agreed to follow the Category 2 plan We discussed the following Behavioral Modification Strategies today: increasing lean protein intake and keeping healthy foods in the home

## 2016-10-06 ENCOUNTER — Encounter: Payer: Self-pay | Admitting: Family Medicine

## 2016-10-10 NOTE — Telephone Encounter (Signed)
Called patient to follow up.  Pt states she's feeling much better. No chest or shortness of breath.  She stated that the symptoms went away after a few days.  No complaints today.  Pt was encouraged not to use Mychart for symptoms such as chest pain or shortness of breath.  That symptoms need to be triaged immediately, and therefore it is recommended to that she call the office.  Pt stated understanding and agreed not to use Mychart for those kinds of things.

## 2016-10-12 ENCOUNTER — Ambulatory Visit (INDEPENDENT_AMBULATORY_CARE_PROVIDER_SITE_OTHER): Payer: Managed Care, Other (non HMO) | Admitting: Physician Assistant

## 2016-10-12 VITALS — BP 121/70 | HR 69 | Temp 98.3°F | Ht 64.0 in | Wt 184.0 lb

## 2016-10-12 DIAGNOSIS — E8881 Metabolic syndrome: Secondary | ICD-10-CM | POA: Diagnosis not present

## 2016-10-12 DIAGNOSIS — Z6831 Body mass index (BMI) 31.0-31.9, adult: Secondary | ICD-10-CM | POA: Diagnosis not present

## 2016-10-12 DIAGNOSIS — F3289 Other specified depressive episodes: Secondary | ICD-10-CM | POA: Diagnosis not present

## 2016-10-12 DIAGNOSIS — Z9189 Other specified personal risk factors, not elsewhere classified: Secondary | ICD-10-CM | POA: Diagnosis not present

## 2016-10-12 DIAGNOSIS — E669 Obesity, unspecified: Secondary | ICD-10-CM | POA: Diagnosis not present

## 2016-10-12 MED ORDER — METFORMIN HCL 500 MG PO TABS: 500.0000 mg | ORAL_TABLET | Freq: Two times a day (BID) | ORAL | 0 refills | Status: DC

## 2016-10-12 MED ORDER — BUPROPION HCL ER (SR) 150 MG PO TB12: 150.0000 mg | ORAL_TABLET | Freq: Every day | ORAL | 0 refills | Status: DC

## 2016-10-12 NOTE — Telephone Encounter (Signed)
Called patient. She stated that symptoms had not returned, but agreed to come in tomorrow (10/13/16) to follow up with Dr. Charlett Blake at 3:15pm.

## 2016-10-12 NOTE — Telephone Encounter (Signed)
Per Dr. Libby Maw should probably have a follow up appointment to discuss more fully and do some testing. Please see if she is willing.

## 2016-10-13 ENCOUNTER — Ambulatory Visit (INDEPENDENT_AMBULATORY_CARE_PROVIDER_SITE_OTHER): Payer: Managed Care, Other (non HMO) | Admitting: Family Medicine

## 2016-10-13 VITALS — BP 122/73 | HR 63 | Temp 98.7°F | Resp 18 | Wt 188.2 lb

## 2016-10-13 DIAGNOSIS — E119 Type 2 diabetes mellitus without complications: Secondary | ICD-10-CM

## 2016-10-13 DIAGNOSIS — E559 Vitamin D deficiency, unspecified: Secondary | ICD-10-CM

## 2016-10-13 DIAGNOSIS — I10 Essential (primary) hypertension: Secondary | ICD-10-CM | POA: Diagnosis not present

## 2016-10-13 DIAGNOSIS — R079 Chest pain, unspecified: Secondary | ICD-10-CM

## 2016-10-13 DIAGNOSIS — E785 Hyperlipidemia, unspecified: Secondary | ICD-10-CM

## 2016-10-13 DIAGNOSIS — F3289 Other specified depressive episodes: Secondary | ICD-10-CM

## 2016-10-13 MED ORDER — ASPIRIN EC 81 MG PO TBEC: 81.0000 mg | DELAYED_RELEASE_TABLET | Freq: Every day | ORAL | Status: DC

## 2016-10-13 MED ORDER — NITROGLYCERIN 0.4 MG SL SUBL: 0.4000 mg | SUBLINGUAL_TABLET | SUBLINGUAL | 3 refills | Status: DC | PRN

## 2016-10-13 NOTE — Progress Notes (Signed)
Office: [phone]  /  Fax: [phone]   HPI:   Chief Complaint: OBESITY Cheryl Taylor is here to discuss her progress with her obesity treatment plan. She is on the Category 2 plan and is following her eating plan approximately 50 % of the time. She states she is exercising 0 minutes 0 times per week. Cheryl Taylor has been snacking more and she states she is having a hard time meeting the protein goal. She is making smarter food choices and she is controlling her portions.  Her weight is 184 lb (83.5 kg) today and has gained 3 pounds since her last visit. She has lost 10 lbs since starting treatment with [place].  Insulin Resistance Cheryl Taylor has a diagnosis of insulin resistance based on her elevated fasting insulin level >5. Although Cheryl Taylor's blood glucose readings are still under good control, insulin resistance puts her at greater risk of metabolic syndrome and diabetes. She is taking metformin currently and continues to work on diet and exercise to decrease risk of diabetes.  At risk for diabetes Cheryl Taylor is at higher than average risk for developing diabetes due to her obesity and insulin resistance. She currently denies polyuria or polydipsia.  Depression with emotional eating behaviors Cheryl Taylor is struggling with emotional eating and using food for comfort to the extent that it is negatively impacting her health. She often snacks when she is not hungry. Cheryl Taylor sometimes feels she is out of control and then feels guilty that she made poor food choices. She has been working on behavior modification techniques to help reduce her emotional eating and has been somewhat successful. Her mood is stable and she shows no sign of suicidal or homicidal ideations.  Depression screen Cheryl Taylor 2/9 05/02/2016 05/22/2015  Decreased Interest 3 0  Down, Depressed, Hopeless 3 0  PHQ - 2 Score 6 0  Altered sleeping 3 -  Tired, decreased energy 3 -  Change in appetite 3 -  Feeling bad or failure about yourself  1  -  Trouble concentrating 0 -  Moving slowly or fidgety/restless 1 -  Suicidal thoughts 1 -  PHQ-9 Score 18 -   ALLERGIES: No Known Allergies  MEDICATIONS: Current Outpatient Prescriptions on File Prior to Visit  Medication Sig Dispense Refill  . atorvastatin (LIPITOR) 10 MG tablet Take 1 tablet (10 mg total) by mouth daily. 30 tablet 0  . escitalopram (LEXAPRO) 20 MG tablet TAKE 1 TABLET DAILY 90 tablet 0  . Krill Oil 350 MG CAPS Take 350 mg by mouth every morning.    . Melatonin 10 MG TABS Take 10 mg by mouth at bedtime.    . metoprolol succinate (TOPROL-XL) 50 MG 24 hr tablet TAKE 1 TABLET DAILY (TAKE WITH OR IMMEDIATELY FOLLOWING A MEAL) 30 tablet 6  . Naproxen-Esomeprazole (VIMOVO) 500-20 MG TBEC Take 500 mg by mouth 2 (two) times daily.    . ranitidine (ZANTAC) 300 MG tablet Take 1 tablet (300 mg total) by mouth at bedtime. 90 tablet 1  . Tryptophan 500 MG TABS Take 500 mg by mouth 2 times daily at 12 noon and 4 pm.    . valACYclovir (VALTREX) 1000 MG tablet TAKE 1 TABLET DAILY 90 tablet 2  . Vitamin D-Vitamin K (D3 + K2 DOTS PO) Take 42 mg by mouth 2 (two) times daily.     No current facility-administered medications on file prior to visit.     PAST MEDICAL HISTORY: Past Medical History:  Diagnosis Date  . Alcohol abuse, in remission   .  Anger   . Anxiety   . Back pain   . Chest pain   . Depression   . Drug use   . Fatigue 02/15/2015  . GERD (gastroesophageal reflux disease)   . HTN (hypertension)   . Hyperlipidemia   . Obesity 08/10/2014  . Obesity   . Pain in both feet    arthritis / Hallox Rigidus  . Perimenopausal 11/18/2013  . Raynaud's disease   . Screening for cervical cancer 02/06/2015  . Sleep apnea   . SOB (shortness of breath)   . Vitamin D deficiency     PAST SURGICAL HISTORY: Past Surgical History:  Procedure Laterality Date  . CESAREAN SECTION  2003  . TONSILLECTOMY  1980    SOCIAL HISTORY: Social History  Substance Use Topics  . Smoking  status: Never Smoker  . Smokeless tobacco: Never Used  . Alcohol use 30.0 oz/week    50 Glasses of wine per week     Comment: quit ETOH on 2013    FAMILY HISTORY: Family History  Problem Relation Age of Onset  . Kidney failure Father 31       deceased  . Kidney disease Father   . Obesity Father   . Seizures Father   . Heart disease Father 104  . Hypertension Father   . Hyperlipidemia Father   . Stroke Father   . Sleep apnea Father   . Schizophrenia Brother        52yo  . Alcohol abuse Brother   . Other Brother        chronic pain, 70 yo  . Stroke Brother   . Alcohol abuse Sister        67yo  . Obesity Paternal Grandfather   . Stroke Sister   . Depression Mother   . Anxiety disorder Mother   . Obesity Mother     ROS: Review of Systems  Constitutional: Negative for weight loss.  Genitourinary: Negative for frequency.  Endo/Heme/Allergies: Negative for polydipsia.  Psychiatric/Behavioral: Positive for depression. Negative for suicidal ideas.    PHYSICAL EXAM: Blood pressure 121/70, pulse 69, temperature 98.3 F (36.8 C), temperature source Oral, height 5\' 4"  (1.626 m), weight 184 lb (83.5 kg), last menstrual period 09/25/2016, SpO2 97 %. Body mass index is 31.58 kg/m. Physical Exam  Constitutional: She is oriented to person, place, and time. She appears well-developed and well-nourished.  Cardiovascular: Normal rate.   Pulmonary/Chest: Effort normal.  Musculoskeletal: Normal range of motion.  Neurological: She is oriented to person, place, and time.  Skin: Skin is warm and dry.  Psychiatric: She has a normal mood and affect. Her behavior is normal.  Vitals reviewed.   RECENT LABS AND TESTS: BMET    Component Value Date/Time   NA 139 08/29/2016 1110   K 3.7 08/29/2016 1110   CL 98 08/29/2016 1110   CO2 24 08/29/2016 1110   GLUCOSE 88 08/29/2016 1110   GLUCOSE 114 (H) 04/12/2016 1558   BUN 20 08/29/2016 1110   CREATININE 0.71 08/29/2016 1110    CREATININE 0.70 11/07/2012 1009   CALCIUM 9.8 08/29/2016 1110   GFRNONAA 98 08/29/2016 1110   GFRAA 112 08/29/2016 1110   Lab Results  Component Value Date   HGBA1C 5.5 08/29/2016   HGBA1C 5.6 05/02/2016   Lab Results  Component Value Date   INSULIN 8.8 08/29/2016   INSULIN 8.1 05/02/2016   CBC    Component Value Date/Time   WBC 10.1 04/12/2016 1558   RBC 4.68 04/12/2016  1558   HGB 13.0 04/12/2016 1558   HCT 39.1 04/12/2016 1558   PLT 252.0 04/12/2016 1558   MCV 83.4 04/12/2016 1558   MCH 28.3 11/07/2012 1009   MCHC 33.3 04/12/2016 1558   RDW 13.5 04/12/2016 1558   LYMPHSABS 2.8 04/12/2016 1558   MONOABS 0.8 04/12/2016 1558   EOSABS 0.4 04/12/2016 1558   BASOSABS 0.1 04/12/2016 1558   Iron/TIBC/Ferritin/ %Sat No results found for: IRON, TIBC, FERRITIN, IRONPCTSAT Lipid Panel     Component Value Date/Time   CHOL 190 08/29/2016 1110   TRIG 102 08/29/2016 1110   HDL 47 08/29/2016 1110   CHOLHDL 6 04/12/2016 1558   VLDL 59.0 (H) 04/12/2016 1558   LDLCALC 123 (H) 08/29/2016 1110   LDLDIRECT 130.0 04/12/2016 1558   Hepatic Function Panel     Component Value Date/Time   PROT 7.2 08/29/2016 1110   ALBUMIN 4.6 08/29/2016 1110   AST 21 08/29/2016 1110   ALT 22 08/29/2016 1110   ALKPHOS 65 08/29/2016 1110   BILITOT <0.2 08/29/2016 1110   BILIDIR 0.0 11/15/2013 1504      Component Value Date/Time   TSH 1.15 04/12/2016 1558   TSH 1.65 05/07/2015 1123   TSH 1.85 11/19/2014 1030    ASSESSMENT AND PLAN: Other depression - With emotional eating - Plan: buPROPion (WELLBUTRIN SR) 150 MG 12 hr tablet  Insulin resistance - Plan: metFORMIN (GLUCOPHAGE) 500 MG tablet  At risk for diabetes mellitus  Class 1 obesity with serious comorbidity and body mass index (BMI) of 31.0 to 31.9 in adult, unspecified obesity type  PLAN:  Insulin Resistance Cheryl Taylor will continue to work on weight loss, exercise, and decreasing simple carbohydrates in her diet to help decrease the  risk of diabetes. We dicussed metformin including benefits and risks. She was informed that eating too many simple carbohydrates or too many calories at one sitting increases the likelihood of GI side effects. Cheryl Taylor agrees to continue taking metformin. We will refill metformin 500 mg BID #60 with no refills. Cheryl Taylor agrees to follow up with our clinic in 3 weeks as directed to monitor her progress.  Diabetes risk counselling Cheryl Taylor was given extended (15 minutes) diabetes prevention counseling today. She is 51 y.o. female and has risk factors for diabetes including obesity and insulin resistance. We discussed intensive lifestyle modifications today with an emphasis on weight loss as well as increasing exercise and decreasing simple carbohydrates in her diet.  Depression with Emotional Eating Behaviors We discussed behavior modification techniques today to help Cheryl Taylor deal with her emotional eating and depression. Cheryl Taylor has agrees to start taking Wellbutrin SR 150 mg qd #30 with no refills and she will follow up with our clinic in 3 weeks.  Obesity Cheryl Taylor is currently in the action stage of change. As such, her goal is to continue with weight loss efforts She has agreed to follow the Category 2 plan Anju has been instructed to work up to a goal of 150 minutes of combined cardio and strengthening exercise per week for weight loss and overall health benefits. We discussed the following Behavioral Modification Strategies today: increasing lean protein intake and work on meal planning and easy cooking plans   Jakiah has agreed to follow up with our clinic in 3 weeks. She was informed of the importance of frequent follow up visits to maximize her success with intensive lifestyle modifications for her multiple health conditions.  Cheryl Taylor, am acting as transcriptionist for Cheryl Duverney, PA-C  I have reviewed  the above documentation for accuracy and completeness, and I agree  with the above. -Cheryl Duverney, PA-C  I have reviewed the above note and agree with the plan. -Cheryl Nip, MD   OBESITY BEHAVIORAL INTERVENTION VISIT  Today's visit was # 10 out of 22.  Starting weight: 194 lbs Starting date: 05/02/16 Today's weight: 184 lbs Today's date: 10/12/16 Total lbs lost to date: 10 (Patients must lose 7 lbs in the first 6 months to continue with counseling)   ASK: We discussed the diagnosis of obesity with Cheryl Taylor today and Cheryl Taylor agreed to give [place] permission to discuss obesity behavioral modification therapy today.  ASSESS: Cheryl Taylor has the diagnosis of obesity and her BMI today is 25 Cheryl Taylor is in the action stage of change   ADVISE: Cheryl Taylor was educated on the multiple health risks of obesity as well as the benefit of weight loss to improve her health. She was advised of the need for long term treatment and the importance of lifestyle modifications.  AGREE: Multiple dietary modification options and treatment options were discussed and  Cheryl Taylor agreed to follow the Category 2 plan We discussed the following Behavioral Modification Strategies today: increasing lean protein intake and work on meal planning and easy cooking plans

## 2016-10-13 NOTE — Patient Instructions (Addendum)
Mylanta as needed   Chest Wall Pain Chest wall pain is pain in or around the bones and muscles of your chest. Sometimes, an injury causes this pain. Sometimes, the cause may not be known. This pain may take several weeks or longer to get better. Follow these instructions at home: Pay attention to any changes in your symptoms. Take these actions to help with your pain:  Rest as told by your health care provider.  Avoid activities that cause pain. These include any activities that use your chest muscles or your abdominal and side muscles to lift heavy items.  If directed, apply ice to the painful area: ? Put ice in a plastic bag. ? Place a towel between your skin and the bag. ? Leave the ice on for 20 minutes, 2-3 times per day.  Take over-the-counter and prescription medicines only as told by your health care provider.  Do not use tobacco products, including cigarettes, chewing tobacco, and e-cigarettes. If you need help quitting, ask your health care provider.  Keep all follow-up visits as told by your health care provider. This is important.  Contact a health care provider if:  You have a fever.  Your chest pain becomes worse.  You have new symptoms. Get help right away if:  You have nausea or vomiting.  You feel sweaty or light-headed.  You have a cough with phlegm (sputum) or you cough up blood.  You develop shortness of breath. This information is not intended to replace advice given to you by your health care provider. Make sure you discuss any questions you have with your health care provider. Document Released: 01/17/2005 Document Revised: 05/28/2015 Document Reviewed: 04/14/2014 Elsevier Interactive Patient Education  2017 [employer].

## 2016-10-13 NOTE — Progress Notes (Signed)
Subjective:  I acted as a Education administrator for Dr. Charlett Blake. [place], [place]  Patient ID: Cheryl Taylor, female    DOB: Sep 14, 1965, 51 y.o.   MRN: [id number]  No chief complaint on file.   HPI  Patient is in today for a follow up on her chest discomfort. She states she is no currently having chest discomfort. No recent febrile illness or acute hospitalizations. Denies CP/palp/SOB/HA/congestion/fevers/GI or GU c/o. Taking meds as prescribed. She reports a total of 3 episodes of chest discomfort over past 6 weeks. Never wakes up with the pain. No obvious pattern of pain occurring. She had an episode of chest pressure last week that resolved spontaneously and lasted about an hour and she was left with heavy fatigue. No SOB, nausea, vomiting, diaphoresis. Denies palp/SOB/HA/congestion/fevers/GI or GU c/o. Taking meds as prescribed    Patient Care Team: Mosie Lukes, MD as PCP - General (Family Medicine)   Past Medical History:  Diagnosis Date  . Alcohol abuse, in remission   . Anger   . Anxiety   . Back pain   . Chest pain   . Depression   . Drug use   . Fatigue 02/15/2015  . GERD (gastroesophageal reflux disease)   . HTN (hypertension)   . Hyperlipidemia   . Obesity 08/10/2014  . Obesity   . Pain in both feet    arthritis / Hallox Rigidus  . Perimenopausal 11/18/2013  . Raynaud's disease   . Screening for cervical cancer 02/06/2015  . Sleep apnea   . SOB (shortness of breath)   . Vitamin D deficiency     Past Surgical History:  Procedure Laterality Date  . CESAREAN SECTION  2003  . TONSILLECTOMY  1980    Family History  Problem Relation Age of Onset  . Kidney failure Father 48       deceased  . Kidney disease Father   . Obesity Father   . Seizures Father   . Heart disease Father 21  . Hypertension Father   . Hyperlipidemia Father   . Stroke Father   . Sleep apnea Father   . Schizophrenia Brother        18yo  . Alcohol abuse Brother   . Other Brother        chronic pain,  71 yo  . Stroke Brother   . Alcohol abuse Sister        74yo  . Obesity Paternal Grandfather   . Stroke Sister   . Depression Mother   . Anxiety disorder Mother   . Obesity Mother     Social History   Social History  . Marital status: Married    Spouse name: Joe  . Number of children: 1  . Years of education: N/A   Occupational History  . 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 Smoking status: Never Smoker  . Smokeless tobacco: Never Used  . Alcohol use 30.0 oz/week    50 Glasses of wine per week     Comment: quit ETOH on 2013  . Drug use: Yes    Types: Amphetamines     Comment: Ativan 4-5mg  daily  . Sexual activity: Yes    Partners: Male    Birth control/ protection: Pill     Comment: lives with husband and daughter, works at [employer], no dietary restrictions   Other Topics Concern  . Not on file   Social History Narrative  . No  narrative on file    Outpatient Medications Prior to Visit  Medication Sig Dispense Refill  . atorvastatin (LIPITOR) 10 MG tablet Take 1 tablet (10 mg total) by mouth daily. 30 tablet 0  . buPROPion (WELLBUTRIN SR) 150 MG 12 hr tablet Take 1 tablet (150 mg total) by mouth daily. 30 tablet 0  . escitalopram (LEXAPRO) 20 MG tablet TAKE 1 TABLET DAILY 90 tablet 0  . Krill Oil 350 MG CAPS Take 350 mg by mouth every morning.    . Melatonin 10 MG TABS Take 10 mg by mouth at bedtime.    . metFORMIN (GLUCOPHAGE) 500 MG tablet Take 1 tablet (500 mg total) by mouth 2 (two) times daily with a meal. 60 tablet 0  . metoprolol succinate (TOPROL-XL) 50 MG 24 hr tablet TAKE 1 TABLET DAILY (TAKE WITH OR IMMEDIATELY FOLLOWING A MEAL) 30 tablet 6  . Naproxen-Esomeprazole (VIMOVO) 500-20 MG TBEC Take 500 mg by mouth 2 (two) times daily.    . ranitidine (ZANTAC) 300 MG tablet Take 1 tablet (300 mg total) by mouth at bedtime. 90 tablet 1  . Tryptophan 500 MG TABS Take 500 mg by mouth 2 times daily at 12 noon and 4 pm.    .  valACYclovir (VALTREX) 1000 MG tablet TAKE 1 TABLET DAILY 90 tablet 2  . Vitamin D-Vitamin K (D3 + K2 DOTS PO) Take 42 mg by mouth 2 (two) times daily.     No facility-administered medications prior to visit.     No Known Allergies  Review of Systems  Constitutional: Positive for malaise/fatigue. Negative for fever.  HENT: Negative for congestion.   Eyes: Negative for blurred vision.  Respiratory: Negative for cough and shortness of breath.   Cardiovascular: Positive for chest pain. Negative for palpitations and leg swelling.  Gastrointestinal: Negative for vomiting.  Musculoskeletal: Negative for back pain.  Skin: Negative for rash.  Neurological: Positive for headaches. Negative for loss of consciousness.  Psychiatric/Behavioral: Negative for depression. The patient is not nervous/anxious.        Objective:    Physical Exam  Constitutional: She is oriented to person, place, and time. She appears well-developed and well-nourished. No distress.  HENT:  Head: Normocephalic and atraumatic.  Eyes: Conjunctivae are normal.  Neck: Normal range of motion. No thyromegaly present.  Cardiovascular: Normal rate and regular rhythm.   Pulmonary/Chest: Effort normal and breath sounds normal. She has no wheezes.  Abdominal: Soft. Bowel sounds are normal. There is no tenderness.  Musculoskeletal: Normal range of motion. She exhibits no edema or deformity.  Neurological: She is alert and oriented to person, place, and time.  Skin: Skin is warm and dry. She is not diaphoretic.  Psychiatric: She has a normal mood and affect.    BP 122/73 (BP Location: Left Arm, Patient Position: Sitting, Cuff Size: Normal)   Pulse 63   Temp 98.7 F (37.1 C) (Oral)   Resp 18   Wt 188 lb 3.2 oz (85.4 kg)   LMP 09/25/2016 (Exact Date)   SpO2 99%   BMI 32.30 kg/m  Wt Readings from Last 3 Encounters:  10/13/16 188 lb 3.2 oz (85.4 kg)  10/12/16 184 lb (83.5 kg)  09/26/16 181 lb (82.1 kg)   BP Readings  from Last 3 Encounters:  10/13/16 122/73  10/12/16 121/70  09/26/16 123/76     Immunization History  Administered Date(s) Administered  . Influenza Split 11/01/2011  . Influenza Whole 11/08/2010  . Influenza,inj,Quad PF,6+ Mos 10/28/2014  . Influenza-Unspecified 12/01/2013  .  Tdap 02/06/2015    Health Maintenance  Topic Date Due  . Hepatitis C Screening  23-Sep-1965  . PNEUMOCOCCAL POLYSACCHARIDE VACCINE (1) 02/11/1965  . FOOT EXAM  02/11/1973  . OPHTHALMOLOGY EXAM  02/11/1973  . URINE MICROALBUMIN  02/11/1973  . MAMMOGRAM  02/07/2016  . INFLUENZA VACCINE  08/31/2016  . HEMOGLOBIN A1C  03/01/2017  . PAP SMEAR  02/05/2018  . Fecal DNA (Cologuard)  03/01/2018  . TETANUS/TDAP  02/05/2025  . HIV Screening  Completed    Lab Results  Component Value Date   WBC 10.1 04/12/2016   HGB 13.0 04/12/2016   HCT 39.1 04/12/2016   PLT 252.0 04/12/2016   GLUCOSE 88 08/29/2016   CHOL 190 08/29/2016   TRIG 102 08/29/2016   HDL 47 08/29/2016   LDLDIRECT 130.0 04/12/2016   LDLCALC 123 (H) 08/29/2016   ALT 22 08/29/2016   AST 21 08/29/2016   NA 139 08/29/2016   K 3.7 08/29/2016   CL 98 08/29/2016   CREATININE 0.71 08/29/2016   BUN 20 08/29/2016   CO2 24 08/29/2016   TSH 1.15 04/12/2016   HGBA1C 5.5 08/29/2016    Lab Results  Component Value Date   TSH 1.15 04/12/2016   Lab Results  Component Value Date   WBC 10.1 04/12/2016   HGB 13.0 04/12/2016   HCT 39.1 04/12/2016   MCV 83.4 04/12/2016   PLT 252.0 04/12/2016   Lab Results  Component Value Date   NA 139 08/29/2016   K 3.7 08/29/2016   CO2 24 08/29/2016   GLUCOSE 88 08/29/2016   BUN 20 08/29/2016   CREATININE 0.71 08/29/2016   BILITOT <0.2 08/29/2016   ALKPHOS 65 08/29/2016   AST 21 08/29/2016   ALT 22 08/29/2016   PROT 7.2 08/29/2016   ALBUMIN 4.6 08/29/2016   CALCIUM 9.8 08/29/2016   GFR 77.46 04/12/2016   Lab Results  Component Value Date   CHOL 190 08/29/2016   Lab Results  Component Value Date    HDL 47 08/29/2016   Lab Results  Component Value Date   LDLCALC 123 (H) 08/29/2016   Lab Results  Component Value Date   TRIG 102 08/29/2016   Lab Results  Component Value Date   CHOLHDL 6 04/12/2016   Lab Results  Component Value Date   HGBA1C 5.5 08/29/2016         Assessment & Plan:   Problem List Items Addressed This Visit    Depression    Doing well with current meds no changes      Vitamin D deficiency    Take Vitamin D daily      Essential hypertension    Well controlled, no changes to meds. Encouraged heart healthy diet such as the DASH diet and exercise as tolerated.       Relevant Medications   nitroGLYCERIN (NITROSTAT) 0.4 MG SL tablet   aspirin EC 81 MG tablet   Chest pain - Primary    Had an episode of chest pressure last week which has resolved. She has had other less intesnse episodes totaling roughlty 3 in past 6 weeks. She has no EKG changes but will proceed with cardiology referral. Take daily ECASA 81 mg daily and given NTG to use prn.       Relevant Orders   Ambulatory referral to Cardiology   EKG 12-Lead (Completed)   Hyperlipidemia    Encouraged heart healthy diet, increase exercise, avoid trans fats, consider a krill oil cap daily  Relevant Medications   nitroGLYCERIN (NITROSTAT) 0.4 MG SL tablet   aspirin EC 81 MG tablet   Morbid obesity (HCC)    Encouraged DASH diet, decrease po intake and increase exercise as tolerated. Needs 7-8 hours of sleep nightly. Avoid trans fats, eat small, frequent meals every 4-5 hours with lean proteins, complex carbs and healthy fats. Minimize simple carbs      Controlled type 2 diabetes mellitus without complication, without long-term current use of insulin (HCC)    hgba1c acceptable, minimize simple carbs. Increase exercise as tolerated.       Relevant Medications   aspirin EC 81 MG tablet      I am having Ms. Perret start on nitroGLYCERIN and aspirin EC. I am also having her maintain her  Naproxen-Esomeprazole, valACYclovir, Tryptophan, Melatonin, Vitamin D-Vitamin K (D3 + K2 DOTS PO), Krill Oil, metoprolol succinate, ranitidine, escitalopram, atorvastatin, metFORMIN, and buPROPion.  Meds ordered this encounter  Medications  . nitroGLYCERIN (NITROSTAT) 0.4 MG SL tablet    Sig: Place 1 tablet (0.4 mg total) under the tongue every 5 (five) minutes as needed for chest pain.    Dispense:  50 tablet    Refill:  3  . aspirin EC 81 MG tablet    Sig: Take 1 tablet (81 mg total) by mouth daily.    CMA served as Education administrator during this visit. History, Physical and Plan performed by medical provider. Documentation and orders reviewed and attested to.  Penni Homans, MD

## 2016-10-16 NOTE — Assessment & Plan Note (Signed)
Take Vitamin D daily °

## 2016-10-16 NOTE — Assessment & Plan Note (Signed)
hgba1c acceptable, minimize simple carbs. Increase exercise as tolerated.  

## 2016-10-16 NOTE — Assessment & Plan Note (Signed)
Well controlled, no changes to meds. Encouraged heart healthy diet such as the DASH diet and exercise as tolerated.  °

## 2016-10-16 NOTE — Assessment & Plan Note (Signed)
Had an episode of chest pressure last week which has resolved. She has had other less intesnse episodes totaling roughlty 3 in past 6 weeks. She has no EKG changes but will proceed with cardiology referral. Take daily ECASA 81 mg daily and given NTG to use prn.

## 2016-10-16 NOTE — Assessment & Plan Note (Signed)
Encouraged heart healthy diet, increase exercise, avoid trans fats, consider a krill oil cap daily 

## 2016-10-16 NOTE — Assessment & Plan Note (Signed)
Encouraged DASH diet, decrease po intake and increase exercise as tolerated. Needs 7-8 hours of sleep nightly. Avoid trans fats, eat small, frequent meals every 4-5 hours with lean proteins, complex carbs and healthy fats. Minimize simple carbs 

## 2016-10-16 NOTE — Assessment & Plan Note (Signed)
Doing well with current meds no changes.  

## 2016-10-17 ENCOUNTER — Encounter: Payer: Self-pay | Admitting: Family Medicine

## 2016-10-18 ENCOUNTER — Encounter: Payer: Self-pay | Admitting: Cardiology

## 2016-10-18 ENCOUNTER — Ambulatory Visit (INDEPENDENT_AMBULATORY_CARE_PROVIDER_SITE_OTHER): Payer: Managed Care, Other (non HMO) | Admitting: Cardiology

## 2016-10-18 ENCOUNTER — Encounter: Payer: Self-pay | Admitting: Family Medicine

## 2016-10-18 VITALS — BP 120/72 | HR 76 | Resp 10 | Ht 64.0 in | Wt 186.1 lb

## 2016-10-18 DIAGNOSIS — I10 Essential (primary) hypertension: Secondary | ICD-10-CM | POA: Diagnosis not present

## 2016-10-18 DIAGNOSIS — E782 Mixed hyperlipidemia: Secondary | ICD-10-CM | POA: Diagnosis not present

## 2016-10-18 DIAGNOSIS — R079 Chest pain, unspecified: Secondary | ICD-10-CM

## 2016-10-18 DIAGNOSIS — R0989 Other specified symptoms and signs involving the circulatory and respiratory systems: Secondary | ICD-10-CM | POA: Diagnosis not present

## 2016-10-18 DIAGNOSIS — G4733 Obstructive sleep apnea (adult) (pediatric): Secondary | ICD-10-CM

## 2016-10-18 MED ORDER — ATORVASTATIN CALCIUM 20 MG PO TABS: 20.0000 mg | ORAL_TABLET | Freq: Every day | ORAL | 1 refills | Status: DC

## 2016-10-18 NOTE — Progress Notes (Signed)
Cardiology Office Note:    Date:  10/18/2016   ID:  Cheryl Taylor, DOB [birth year], MRN [id number]  PCP:  Mosie Lukes, MD  Cardiologist:  Jenean Lindau, MD   Referring MD: Mosie Lukes, MD    ASSESSMENT:    1. Chest pain, unspecified type   2. Essential hypertension   3. OSA (obstructive sleep apnea)   4. Bilateral carotid bruits   5. Mixed dyslipidemia    PLAN:    In order of problems listed above:  1. Primary prevention stressed to the patient. Importance of compliance with diet and medications stressed and she localized understanding.In view of her symptoms we'll schedule her for a exercise stress echo. 2. Her blood pressure stable and is monitored carefully and closely by her primary care physician. 3. Sleep health issues were discussed 4. Patient has bilateral soft carotid bruits and in view of family history of stroke and we'll do a carotid bilateral Doppler. 5. In view of her significant history of stroke I told her that I would rather be more aggressive with her lipids and increased atorvastatin to 20 mg a day. Diet was discussed. She'll be back in 6 weeks for liver lipid check. 6. Risks of obesity explained and diet was discussed to lose weight and she verbalized understanding. She'll be seen in follow-up appointment in 2 months or earlier if she has any concerns. She knows to go to the nearest emergency room for any concerning symptoms.   Medication Adjustments/Labs and Tests Ordered: Current medicines are reviewed at length with the patient today.  Concerns regarding medicines are outlined above.  No orders of the defined types were placed in this encounter.  No orders of the defined types were placed in this encounter.    History of Present Illness:    Cheryl Taylor is a 51 y.o. female who is being seen today for the evaluation of chest pain at the request of Mosie Lukes, MD. Patient is a pleasant 51 year old female. She has past medical history of  essential hypertension, sleep apnea and dyslipidemia. She has strong family history of stroke. She mentions to me that she occasionally has chest tightness. No radiation of the symptoms to the neck or to the arms. They do not occur upon exertion or sexual activity. At the time of my evaluation she is alert awake oriented and in no distress. She is concerned about the symptoms. She also mentions to me that she takes medications like metformin for weight loss. Medical records revealed her to have insulin resistance.  Past Medical History:  Diagnosis Date  . Alcohol abuse, in remission   . Anger   . Anxiety   . Back pain   . Chest pain   . Depression   . Diabetes mellitus without complication ([place])   . Drug use   . Fatigue 02/15/2015  . GERD (gastroesophageal reflux disease)   . HTN (hypertension)   . Hyperlipidemia   . Obesity 08/10/2014  . Obesity   . Pain in both feet    arthritis / Hallox Rigidus  . Perimenopausal 11/18/2013  . Raynaud's disease   . Screening for cervical cancer 02/06/2015  . Sleep apnea   . SOB (shortness of breath)   . Vitamin D deficiency     Past Surgical History:  Procedure Laterality Date  . CESAREAN SECTION  2003  . TONSILLECTOMY  1980    Current Medications: Current Meds  Medication Sig  . aspirin EC 81 MG tablet Take  1 tablet (81 mg total) by mouth daily.  Marland Kitchen atorvastatin (LIPITOR) 10 MG tablet Take 1 tablet (10 mg total) by mouth daily.  Marland Kitchen buPROPion (WELLBUTRIN SR) 150 MG 12 hr tablet Take 1 tablet (150 mg total) by mouth daily.  Marland Kitchen escitalopram (LEXAPRO) 20 MG tablet TAKE 1 TABLET DAILY  . Krill Oil 350 MG CAPS Take 350 mg by mouth every morning.  . Melatonin 10 MG TABS Take 10 mg by mouth at bedtime.  . metFORMIN (GLUCOPHAGE) 500 MG tablet Take 1 tablet (500 mg total) by mouth 2 (two) times daily with a meal.  . metoprolol succinate (TOPROL-XL) 50 MG 24 hr tablet TAKE 1 TABLET DAILY (TAKE WITH OR IMMEDIATELY FOLLOWING A MEAL)  .  Naproxen-Esomeprazole (VIMOVO) 500-20 MG TBEC Take 500 mg by mouth 2 (two) times daily.  . nitroGLYCERIN (NITROSTAT) 0.4 MG SL tablet Place 1 tablet (0.4 mg total) under the tongue every 5 (five) minutes as needed for chest pain.  . ranitidine (ZANTAC) 300 MG tablet Take 1 tablet (300 mg total) by mouth at bedtime.  . Tryptophan 500 MG TABS Take 500 mg by mouth 2 times daily at 12 noon and 4 pm.  . valACYclovir (VALTREX) 1000 MG tablet TAKE 1 TABLET DAILY  . Vitamin D-Vitamin K (D3 + K2 DOTS PO) Take 42 mg by mouth 2 (two) times daily.     Allergies:   Patient has no known allergies.   Social History   Social History  . Marital status: Married    Spouse name: Joe  . Number of children: 1  . Years of education: N/A   Occupational History  . 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 Smoking status: Never Smoker  . Smokeless tobacco: Never Used  . Alcohol use No     Comment: quit ETOH on 2013  . Drug use: No     Comment: Ativan 4-5mg  daily, no longer taking  . Sexual activity: Yes    Partners: Male    Birth control/ protection: Pill     Comment: lives with husband and daughter, works at [employer], no dietary restrictions   Other Topics Concern  . None   Social History Narrative  . None     Family History: The patient's family history includes Alcohol abuse in her brother and sister; Anxiety disorder in her mother; Depression in her mother; Heart disease (age of onset: 43) in her father; Hyperlipidemia in her father; Hypertension in her father; Kidney disease in her father; Kidney failure (age of onset: 10) in her father; Obesity in her father, mother, and paternal grandfather; Other in her brother; Schizophrenia in her brother; Seizures in her father; Sleep apnea in her father; Stroke in her brother, father, and sister.  ROS:   Please see the history of present illness.    All other systems reviewed and are negative.  EKGs/Labs/Other Studies  Reviewed:    The following studies were reviewed today: I reviewed records from primary care physician's office and discuss this with the patientat extensive length.   Recent Labs: 04/12/2016: Hemoglobin 13.0; Platelets 252.0; TSH 1.15 08/29/2016: ALT 22; BUN 20; Creatinine, Ser 0.71; Potassium 3.7; Sodium 139  Recent Lipid Panel    Component Value Date/Time   CHOL 190 08/29/2016 1110   TRIG 102 08/29/2016 1110   HDL 47 08/29/2016 1110   CHOLHDL 6 04/12/2016 1558   VLDL 59.0 (H) 04/12/2016 1558   LDLCALC 123 (H)  08/29/2016 1110   LDLDIRECT 130.0 04/12/2016 1558    Physical Exam:    VS:  BP 120/72   Pulse 76   Resp 10   Ht 5\' 4"  (1.626 m)   Wt 186 lb 1.9 oz (84.4 kg)   LMP 09/25/2016 (Exact Date)   BMI 31.95 kg/m     Wt Readings from Last 3 Encounters:  10/18/16 186 lb 1.9 oz (84.4 kg)  10/13/16 188 lb 3.2 oz (85.4 kg)  10/12/16 184 lb (83.5 kg)     GEN: Patient is in no acute distress HEENT: Normal NECK: No JVD; Bil soft carotid bruits LYMPHATICS: No lymphadenopathy CARDIAC: S1 S2 regular, 2/6 systolic murmur at the apex. RESPIRATORY:  Clear to auscultation without rales, wheezing or rhonchi  ABDOMEN: Soft, non-tender, non-distended MUSCULOSKELETAL:  No edema; No deformity  SKIN: Warm and dry NEUROLOGIC:  Alert and oriented x 3 PSYCHIATRIC:  Normal affect    Signed, Jenean Lindau, MD  10/18/2016 10:14 AM    [place]

## 2016-10-18 NOTE — Addendum Note (Signed)
Addended by: Orland Penman on: 10/18/2016 10:36 AM   Modules accepted: Orders

## 2016-10-18 NOTE — Patient Instructions (Signed)
Medication Instructions:   Your physician has recommended you make the following change in your medication:   CHANGE: Atorvastatin from 10 mg to 20 mg once daily.    Labwork:  NONE  Testing/Procedures:  Your physician has requested that you have a carotid duplex. This test is an ultrasound of the carotid arteries in your neck. It looks at blood flow through these arteries that supply the brain with blood. Allow one hour for this exam. There are no restrictions or special instructions.  Your physician has requested that you have a stress echocardiogram. For further information please visit [email or web address]. Please follow instruction sheet as given.    Follow-Up:  Your physician recommends that you schedule a follow-up appointment in: 2 months with Dr. Geraldo Pitter.    Any Other Special Instructions Will Be Listed Below (If Applicable).     If you need a refill on your cardiac medications before your next appointment, please call your pharmacy.

## 2016-10-21 IMAGING — MG MM SCREENING BREAST TOMO BILATERAL
6 of 9 series · 6 of 25 positions shown · non-contrast
Comparison: Previous exam(s).

CLINICAL DATA: Screening.

EXAM:
DIGITAL SCREENING BILATERAL MAMMOGRAM WITH 3D TOMO WITH CAD

[R CC (1 of 2)]
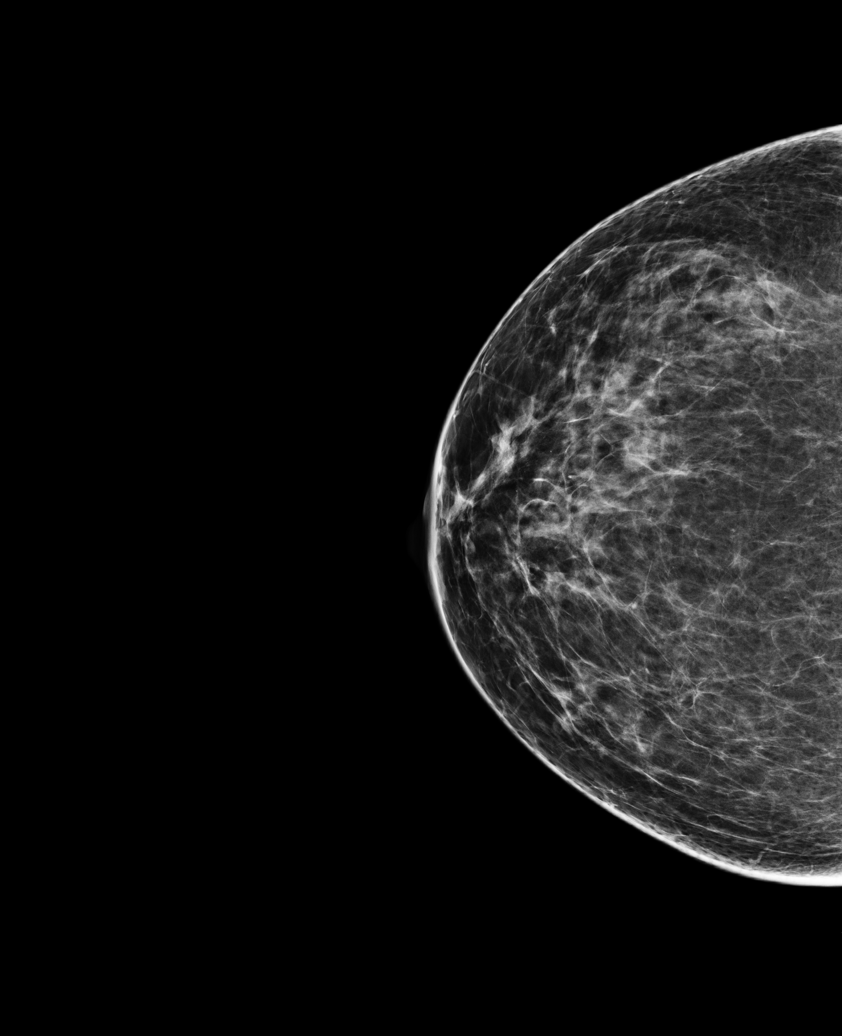

[L MLO]
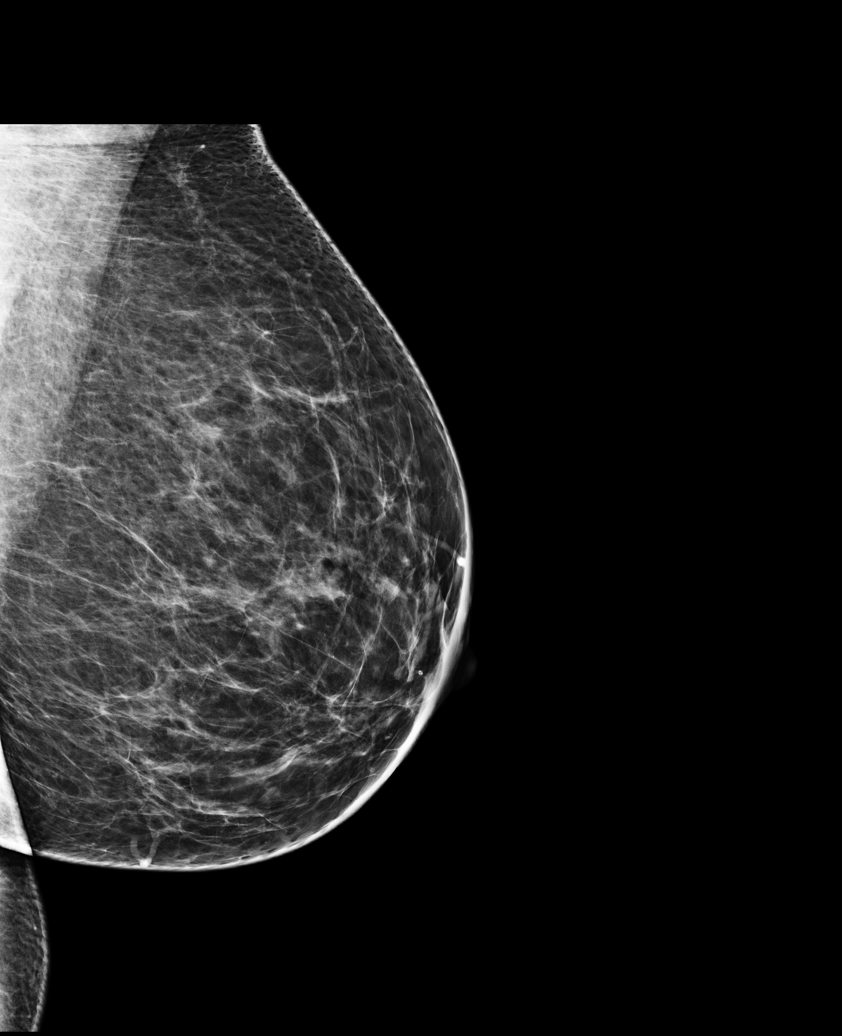

[R MLO]
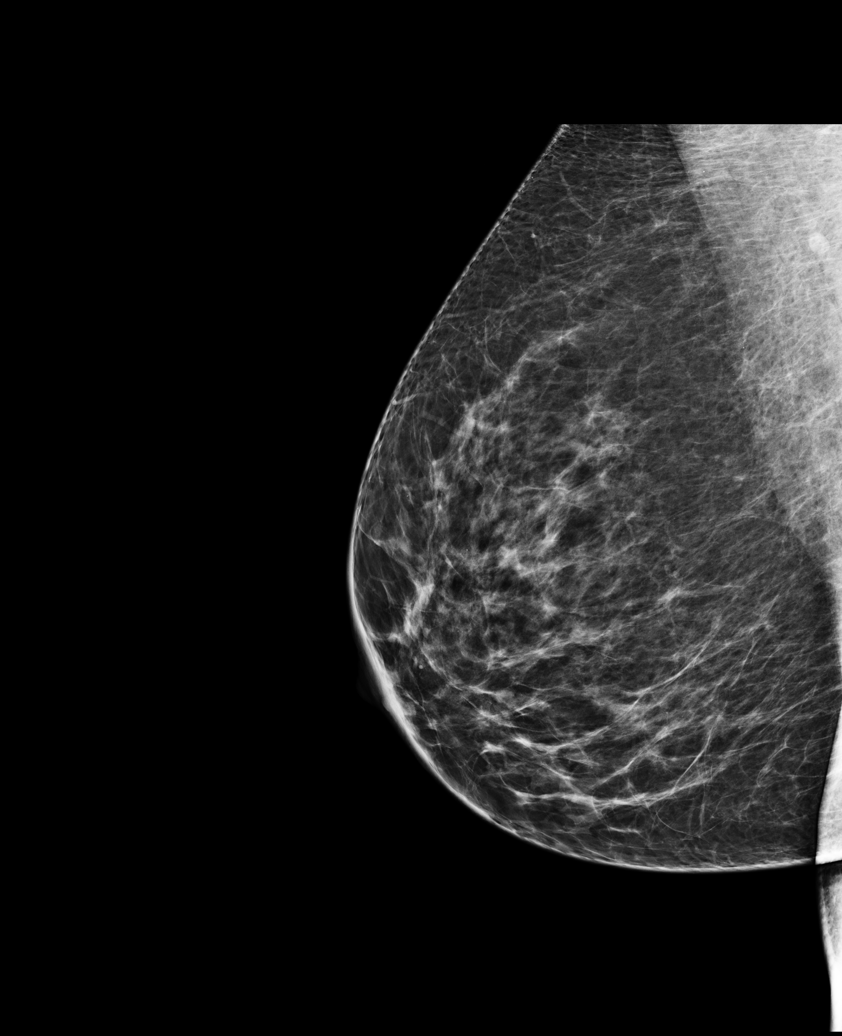

[L CC]
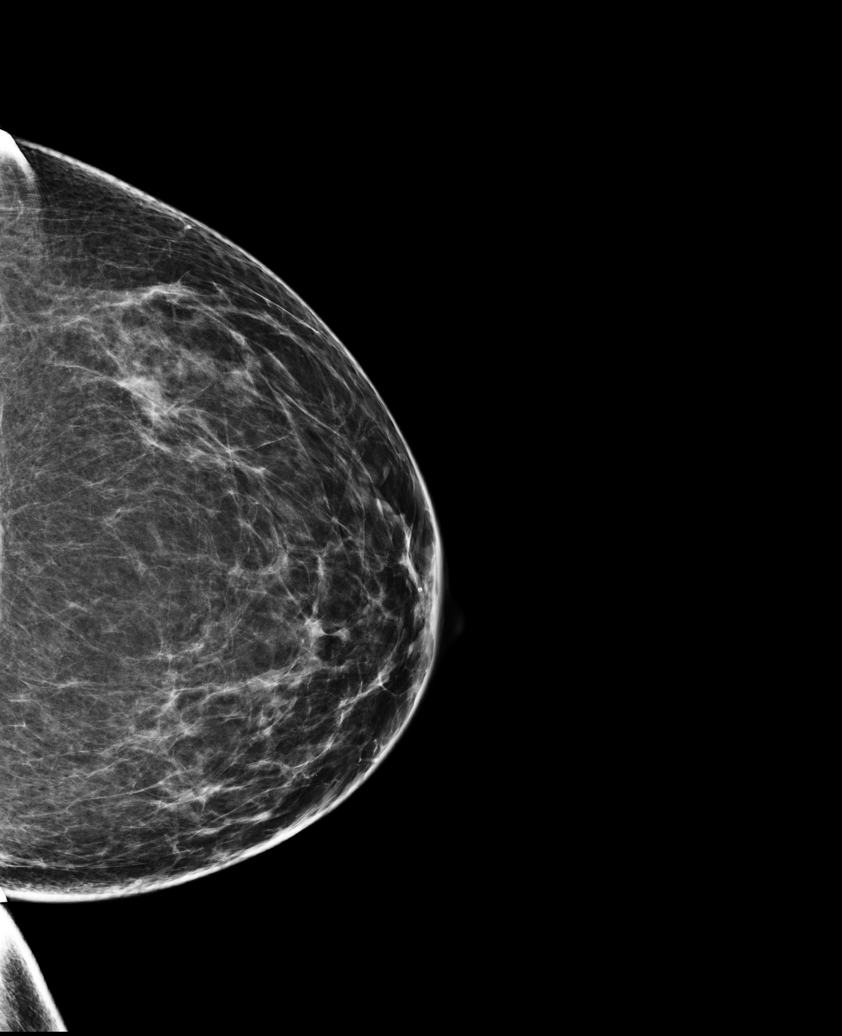

[R CC (2 of 2)]
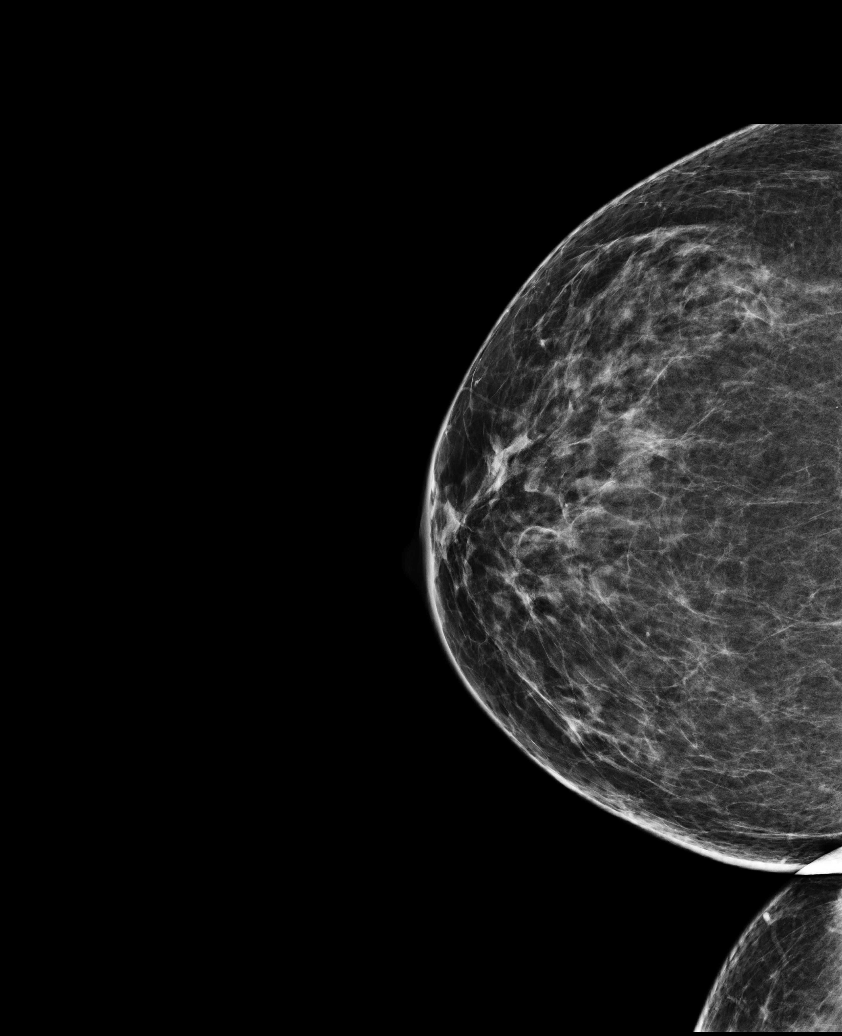

[R CC tomo · tomo slice 39/78.0]
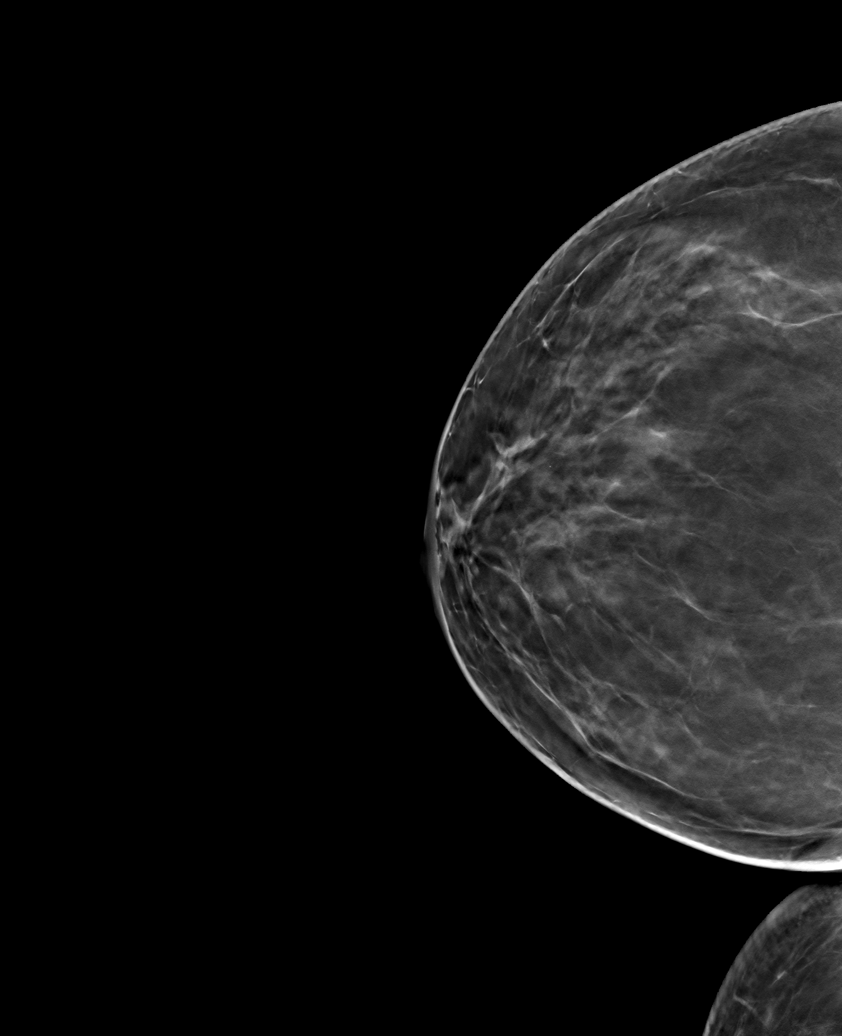

[6 of 25 positions shown; findings below may reference images not displayed]

ACR Breast Density Category b: There are scattered areas of
fibroglandular density.
FINDINGS: There has been no significant interval change and there are no
findings suspicious for malignancy. Images were processed with CAD.
IMPRESSION: No mammographic evidence of malignancy. A result letter of this
screening mammogram will be mailed directly to the patient.

RECOMMENDATION:
Screening mammogram in one year. (Code:TP-G-A98)

BI-RADS CATEGORY  1: Negative.

## 2016-10-26 ENCOUNTER — Ambulatory Visit (HOSPITAL_BASED_OUTPATIENT_CLINIC_OR_DEPARTMENT_OTHER): Payer: Managed Care, Other (non HMO)

## 2016-10-27 ENCOUNTER — Ambulatory Visit (HOSPITAL_BASED_OUTPATIENT_CLINIC_OR_DEPARTMENT_OTHER): Admission: RE | Admit: 2016-10-27 | Payer: Managed Care, Other (non HMO) | Source: Ambulatory Visit

## 2016-10-27 ENCOUNTER — Ambulatory Visit (HOSPITAL_BASED_OUTPATIENT_CLINIC_OR_DEPARTMENT_OTHER): Payer: Managed Care, Other (non HMO)

## 2016-11-02 ENCOUNTER — Ambulatory Visit (INDEPENDENT_AMBULATORY_CARE_PROVIDER_SITE_OTHER): Payer: Managed Care, Other (non HMO) | Admitting: Physician Assistant

## 2016-11-02 VITALS — BP 105/71 | HR 69 | Temp 97.9°F | Ht 64.0 in | Wt 180.0 lb

## 2016-11-02 DIAGNOSIS — Z6830 Body mass index (BMI) 30.0-30.9, adult: Secondary | ICD-10-CM

## 2016-11-02 DIAGNOSIS — E7849 Other hyperlipidemia: Secondary | ICD-10-CM

## 2016-11-02 DIAGNOSIS — F3289 Other specified depressive episodes: Secondary | ICD-10-CM | POA: Diagnosis not present

## 2016-11-02 DIAGNOSIS — E669 Obesity, unspecified: Secondary | ICD-10-CM

## 2016-11-02 DIAGNOSIS — Z9189 Other specified personal risk factors, not elsewhere classified: Secondary | ICD-10-CM

## 2016-11-02 MED ORDER — BUPROPION HCL ER (SR) 200 MG PO TB12: 200.0000 mg | ORAL_TABLET | Freq: Every day | ORAL | 0 refills | Status: DC

## 2016-11-03 NOTE — Progress Notes (Signed)
Office: [phone]  /  Fax: [phone]   HPI:   Chief Complaint: OBESITY Cheryl Taylor is here to discuss her progress with her obesity treatment plan. She is on the  follow the Category 2 plan and is following her eating plan approximately 50 % of the time. She states she is exercising 0 minutes 0 times per week. Cheryl Taylor continues to do well with weight loss. She is following the meal plan and she adds variety to her meals. Hunger is well controlled. Her weight is 180 lb (81.6 kg) today and has had a weight loss of 4 pounds over a period of 3 weeks since her last visit. She has lost 14 lbs since starting treatment with Cheryl Taylor.  Hyperlipidemia Cheryl Taylor has hyperlipidemia and has been trying to improve her cholesterol levels with intensive lifestyle modification including a low saturated fat diet, exercise and weight loss. She follows up with her cardiologist and Lipitor is increased to 20 mg. She does complain of occasional chest pain. She denies claudication or myalgias.  At risk for cardiovascular disease Cheryl Taylor is at a higher than average risk for cardiovascular disease due to obesity and hyperlipidemia.   Depression with emotional eating behaviors Cheryl Taylor is struggling with emotional eating and using food for comfort to the extent that it is negatively impacting her health. She often snacks when she is not hungry. Cheryl Taylor sometimes feels she is out of control and then feels guilty that she made poor food choices. She has been working on behavior modification techniques to help reduce her emotional eating and has been somewhat successful. Her mood is stable and she shows no sign of suicidal or homicidal ideations.   Depression screen Cheryl Taylor 2/9 05/02/2016 05/22/2015  Decreased Interest 3 0  Down, Depressed, Hopeless 3 0  PHQ - 2 Score 6 0  Altered sleeping 3 -  Tired, decreased energy 3 -  Change in appetite 3 -  Feeling bad or failure about yourself  1 -  Trouble concentrating 0 -    Moving slowly or fidgety/restless 1 -  Suicidal thoughts 1 -  PHQ-9 Score 18 -     ALLERGIES: No Known Allergies  MEDICATIONS: Current Outpatient Prescriptions on File Prior to Visit  Medication Sig Dispense Refill  . aspirin EC 81 MG tablet Take 1 tablet (81 mg total) by mouth daily.    Marland Kitchen atorvastatin (LIPITOR) 20 MG tablet Take 1 tablet (20 mg total) by mouth daily. 90 tablet 1  . escitalopram (LEXAPRO) 20 MG tablet TAKE 1 TABLET DAILY 90 tablet 0  . Krill Oil 350 MG CAPS Take 350 mg by mouth every morning.    . Melatonin 10 MG TABS Take 10 mg by mouth at bedtime.    . metFORMIN (GLUCOPHAGE) 500 MG tablet Take 1 tablet (500 mg total) by mouth 2 (two) times daily with a meal. 60 tablet 0  . metoprolol succinate (TOPROL-XL) 50 MG 24 hr tablet TAKE 1 TABLET DAILY (TAKE WITH OR IMMEDIATELY FOLLOWING A MEAL) 30 tablet 6  . Naproxen-Esomeprazole (VIMOVO) 500-20 MG TBEC Take 500 mg by mouth 2 (two) times daily.    . nitroGLYCERIN (NITROSTAT) 0.4 MG SL tablet Place 1 tablet (0.4 mg total) under the tongue every 5 (five) minutes as needed for chest pain. 50 tablet 3  . ranitidine (ZANTAC) 300 MG tablet Take 1 tablet (300 mg total) by mouth at bedtime. 90 tablet 1  . Tryptophan 500 MG TABS Take 500 mg by mouth 2 times daily at 12 noon  and 4 pm.    . valACYclovir (VALTREX) 1000 MG tablet TAKE 1 TABLET DAILY 90 tablet 2  . Vitamin D-Vitamin K (D3 + K2 DOTS PO) Take 42 mg by mouth 2 (two) times daily.     No current facility-administered medications on file prior to visit.     PAST MEDICAL HISTORY: Past Medical History:  Diagnosis Date  . Alcohol abuse, in remission   . Anger   . Anxiety   . Back pain   . Chest pain   . Depression   . Diabetes mellitus without complication (Cheryl Taylor)   . Drug use   . Fatigue 02/15/2015  . GERD (gastroesophageal reflux disease)   . HTN (hypertension)   . Hyperlipidemia   . Obesity 08/10/2014  . Obesity   . Pain in both feet    arthritis / Hallox Rigidus   . Perimenopausal 11/18/2013  . Raynaud's disease   . Screening for cervical cancer 02/06/2015  . Sleep apnea   . SOB (shortness of breath)   . Vitamin D deficiency     PAST SURGICAL HISTORY: Past Surgical History:  Procedure Laterality Date  . CESAREAN SECTION  2003  . TONSILLECTOMY  1980    SOCIAL HISTORY: Social History  Substance Use Topics  . Smoking status: Never Smoker  . Smokeless tobacco: Never Used  . Alcohol use No     Comment: quit ETOH on 2013    FAMILY HISTORY: Family History  Problem Relation Age of Onset  . Kidney failure Father 71       deceased  . Kidney disease Father   . Obesity Father   . Seizures Father   . Heart disease Father 72  . Hypertension Father   . Hyperlipidemia Father   . Stroke Father   . Sleep apnea Father   . Schizophrenia Brother        75yo  . Alcohol abuse Brother   . Other Brother        chronic pain, 15 yo  . Stroke Brother   . Alcohol abuse Sister        42yo  . Obesity Paternal Grandfather   . Stroke Sister   . Depression Mother   . Anxiety disorder Mother   . Obesity Mother     ROS: Review of Systems  Constitutional: Positive for weight loss.  Cardiovascular: Positive for chest pain. Negative for claudication.  Musculoskeletal: Negative for myalgias.  Psychiatric/Behavioral: Positive for depression. Negative for suicidal ideas.    PHYSICAL EXAM: Blood pressure 105/71, pulse 69, temperature 97.9 F (36.6 C), height 5\' 4"  (1.626 m), weight 180 lb (81.6 kg), SpO2 98 %. Body mass index is 30.9 kg/m. Physical Exam  Constitutional: She is oriented to person, place, and time. She appears well-developed and well-nourished.  Cardiovascular: Normal rate.   Pulmonary/Chest: Effort normal.  Musculoskeletal: Normal range of motion.  Neurological: She is oriented to person, place, and time.  Skin: Skin is warm and dry.  Psychiatric: She has a normal mood and affect. Her behavior is normal.  Vitals  reviewed.   RECENT LABS AND TESTS: BMET    Component Value Date/Time   NA 139 08/29/2016 1110   K 3.7 08/29/2016 1110   CL 98 08/29/2016 1110   CO2 24 08/29/2016 1110   GLUCOSE 88 08/29/2016 1110   GLUCOSE 114 (H) 04/12/2016 1558   BUN 20 08/29/2016 1110   CREATININE 0.71 08/29/2016 1110   CREATININE 0.70 11/07/2012 1009   CALCIUM 9.8 08/29/2016 1110  GFRNONAA 98 08/29/2016 1110   GFRAA 112 08/29/2016 1110   Lab Results  Component Value Date   HGBA1C 5.5 08/29/2016   HGBA1C 5.6 05/02/2016   Lab Results  Component Value Date   INSULIN 8.8 08/29/2016   INSULIN 8.1 05/02/2016   CBC    Component Value Date/Time   WBC 10.1 04/12/2016 1558   RBC 4.68 04/12/2016 1558   HGB 13.0 04/12/2016 1558   HCT 39.1 04/12/2016 1558   PLT 252.0 04/12/2016 1558   MCV 83.4 04/12/2016 1558   MCH 28.3 11/07/2012 1009   MCHC 33.3 04/12/2016 1558   RDW 13.5 04/12/2016 1558   LYMPHSABS 2.8 04/12/2016 1558   MONOABS 0.8 04/12/2016 1558   EOSABS 0.4 04/12/2016 1558   BASOSABS 0.1 04/12/2016 1558   Iron/TIBC/Ferritin/ %Sat No results found for: IRON, TIBC, FERRITIN, IRONPCTSAT Lipid Panel     Component Value Date/Time   CHOL 190 08/29/2016 1110   TRIG 102 08/29/2016 1110   HDL 47 08/29/2016 1110   CHOLHDL 6 04/12/2016 1558   VLDL 59.0 (H) 04/12/2016 1558   LDLCALC 123 (H) 08/29/2016 1110   LDLDIRECT 130.0 04/12/2016 1558   Hepatic Function Panel     Component Value Date/Time   PROT 7.2 08/29/2016 1110   ALBUMIN 4.6 08/29/2016 1110   AST 21 08/29/2016 1110   ALT 22 08/29/2016 1110   ALKPHOS 65 08/29/2016 1110   BILITOT <0.2 08/29/2016 1110   BILIDIR 0.0 11/15/2013 1504      Component Value Date/Time   TSH 1.15 04/12/2016 1558   TSH 1.65 05/07/2015 1123   TSH 1.85 11/19/2014 1030    ASSESSMENT AND PLAN: Other hyperlipidemia  Other depression - with emotional eating - Plan: buPROPion (WELLBUTRIN SR) 200 MG 12 hr tablet  At risk for heart disease  Class 1 obesity  with serious comorbidity and body mass index (BMI) of 30.0 to 30.9 in adult, unspecified obesity type  PLAN:  Hyperlipidemia Cheryl Taylor was informed of the American Heart Association Guidelines emphasizing intensive lifestyle modifications as the first line treatment for hyperlipidemia. We discussed many lifestyle modifications today in depth, and Cheryl Taylor will continue to work on decreasing saturated fats such as fatty red meat, butter and many fried foods. She will also increase vegetables and lean protein in her diet and continue to work on exercise and weight loss efforts. Cheryl Taylor will continue her medications as prescribed and will follow up as directed.  Cardiovascular risk counseling Cheryl Taylor was given extended (15 minutes) coronary artery disease prevention counseling today. She is 52 y.o. female and has risk factors for heart disease including obesity and hyperlipidemia. We discussed intensive lifestyle modifications today with an emphasis on specific weight loss instructions and strategies. Pt was also informed of the importance of increasing exercise and decreasing saturated fats to help prevent heart disease.  Depression with Emotional Eating Behaviors We discussed behavior modification techniques today to help Cheryl Taylor deal with her emotional eating and depression. She has agreed to increase Wellbutrin SR to 200 mg qd at noon #30 with no refills and will follow up as directed.  Obesity Cheryl Taylor is currently in the action stage of change. As such, her goal is to continue with weight loss efforts She has agreed to follow the Category 2 plan Cheryl Taylor has been instructed to work up to a goal of 150 minutes of combined cardio and strengthening exercise per week for weight loss and overall health benefits. We discussed the following Behavioral Modification Strategies today: increasing lean protein intake  and work on meal planning and easy cooking plans  Cheryl Taylor has agreed to follow up with  our clinic in 3 weeks. She was informed of the importance of frequent follow up visits to maximize her success with intensive lifestyle modifications for her multiple health conditions.  I, Doreene Nest, am acting as transcriptionist for Lacy Duverney, PA-C  I have reviewed the above documentation for accuracy and completeness, and I agree with the above. -Lacy Duverney, PA-C  I have reviewed the above note and agree with the plan. -Dennard Nip, MD   OBESITY BEHAVIORAL INTERVENTION VISIT  Today's visit was # 11 out of 22.  Starting weight: 194 lbs Starting date: 05/02/16 Today's weight : 180 lbs Today's date: 11/02/2016 Total lbs lost to date: 14 (Patients must lose 7 lbs in the first 6 months to continue with counseling)   ASK: We discussed the diagnosis of obesity with Cheryl Taylor today and Cheryl Taylor agreed to give Cheryl Taylor permission to discuss obesity behavioral modification therapy today.  ASSESS: Cheryl Taylor has the diagnosis of obesity and her BMI today is 30.88 Cheryl Taylor is in the action stage of change   ADVISE: Cheryl Taylor was educated on the multiple health risks of obesity as well as the benefit of weight loss to improve her health. She was advised of the need for long term treatment and the importance of lifestyle modifications.  AGREE: Multiple dietary modification options and treatment options were discussed and  Cheryl Taylor agreed to follow the Category 2 plan We discussed the following Behavioral Modification Strategies today: increasing lean protein intake and work on meal planning and easy cooking plans

## 2016-11-16 ENCOUNTER — Ambulatory Visit (HOSPITAL_BASED_OUTPATIENT_CLINIC_OR_DEPARTMENT_OTHER): Payer: Managed Care, Other (non HMO)

## 2016-11-23 ENCOUNTER — Ambulatory Visit (INDEPENDENT_AMBULATORY_CARE_PROVIDER_SITE_OTHER): Payer: Managed Care, Other (non HMO) | Admitting: Physician Assistant

## 2016-11-23 VITALS — BP 127/73 | HR 79 | Temp 98.8°F | Ht 64.0 in | Wt 181.0 lb

## 2016-11-23 DIAGNOSIS — E669 Obesity, unspecified: Secondary | ICD-10-CM | POA: Diagnosis not present

## 2016-11-23 DIAGNOSIS — Z6831 Body mass index (BMI) 31.0-31.9, adult: Secondary | ICD-10-CM | POA: Diagnosis not present

## 2016-11-23 DIAGNOSIS — K5909 Other constipation: Secondary | ICD-10-CM

## 2016-11-23 DIAGNOSIS — Z9189 Other specified personal risk factors, not elsewhere classified: Secondary | ICD-10-CM | POA: Diagnosis not present

## 2016-11-23 DIAGNOSIS — R079 Chest pain, unspecified: Secondary | ICD-10-CM

## 2016-11-24 ENCOUNTER — Other Ambulatory Visit: Payer: Self-pay | Admitting: Family Medicine

## 2016-11-24 MED ORDER — POLYETHYLENE GLYCOL 3350 17 GM/SCOOP PO POWD: 17.0000 g | Freq: Every day | ORAL | 0 refills | Status: DC

## 2016-11-24 NOTE — Progress Notes (Signed)
Office: [phone]  /  Fax: [phone]   HPI:   Chief Complaint: OBESITY Cheryl Taylor is here to discuss her progress with her obesity treatment plan. She is on the Category 2 plan and is following her eating plan approximately 50 % of the time. She states she is exercising 0 minutes 0 times per week. Cheryl Taylor is mindful of her food choices but has been eating out more. She is motivated to get back on track and continue weight loss efforts.  Her weight is 181 lb (82.1 kg) today and has gained 1 pound since her last visit. She has lost 13 lbs since starting treatment with [place].  Constipation Cheryl Taylor notes constipation for the last few weeks, worse since attempting weight loss. She states BM are less frequent and are not hard and painful. She denies hematochezia or melena. She denies drinking less H20 recently.  Chest pain Patient denies any current chest pain. She has had an episode of chest pain 5 weeks ago. She was evaluated by Cardiologist, who according to chart review,  increased her Lipitor to 20 mg and recommended carotid duplex for further evaluation of carotid bruits found on exam.   Pt currently on BB, ASA, Nitroglycerin, and statin.  At risk for cardiovascular disease Cheryl Taylor is at a higher than average risk for cardiovascular disease due to obesity. She currently denies any chest pain.    Depression screen Cheryl Taylor 2/9 05/02/2016 05/22/2015  Decreased Interest 3 0  Down, Depressed, Hopeless 3 0  PHQ - 2 Score 6 0  Altered sleeping 3 -  Tired, decreased energy 3 -  Change in appetite 3 -  Feeling bad or failure about yourself  1 -  Trouble concentrating 0 -  Moving slowly or fidgety/restless 1 -  Suicidal thoughts 1 -  PHQ-9 Score 18 -       ALLERGIES: No Known Allergies  MEDICATIONS: Current Outpatient Prescriptions on File Prior to Visit  Medication Sig Dispense Refill  . aspirin EC 81 MG tablet Take 1 tablet (81 mg total) by mouth daily.    Marland Kitchen atorvastatin  (LIPITOR) 20 MG tablet Take 1 tablet (20 mg total) by mouth daily. 90 tablet 1  . buPROPion (WELLBUTRIN SR) 200 MG 12 hr tablet Take 1 tablet (200 mg total) by mouth daily at 12 noon. 30 tablet 0  . Krill Oil 350 MG CAPS Take 350 mg by mouth every morning.    . Melatonin 10 MG TABS Take 10 mg by mouth at bedtime.    . metFORMIN (GLUCOPHAGE) 500 MG tablet Take 1 tablet (500 mg total) by mouth 2 (two) times daily with a meal. 60 tablet 0  . metoprolol succinate (TOPROL-XL) 50 MG 24 hr tablet TAKE 1 TABLET DAILY (TAKE WITH OR IMMEDIATELY FOLLOWING A MEAL) 30 tablet 6  . Naproxen-Esomeprazole (VIMOVO) 500-20 MG TBEC Take 500 mg by mouth 2 (two) times daily.    . nitroGLYCERIN (NITROSTAT) 0.4 MG SL tablet Place 1 tablet (0.4 mg total) under the tongue every 5 (five) minutes as needed for chest pain. 50 tablet 3  . ranitidine (ZANTAC) 300 MG tablet Take 1 tablet (300 mg total) by mouth at bedtime. 90 tablet 1  . Tryptophan 500 MG TABS Take 500 mg by mouth 2 times daily at 12 noon and 4 pm.    . valACYclovir (VALTREX) 1000 MG tablet TAKE 1 TABLET DAILY 90 tablet 2  . Vitamin D-Vitamin K (D3 + K2 DOTS PO) Take 42 mg by mouth 2 (two)  times daily.     No current facility-administered medications on file prior to visit.     PAST MEDICAL HISTORY: Past Medical History:  Diagnosis Date  . Alcohol abuse, in remission   . Anger   . Anxiety   . Back pain   . Chest pain   . Depression   . Diabetes mellitus without complication ([place])   . Drug use   . Fatigue 02/15/2015  . GERD (gastroesophageal reflux disease)   . HTN (hypertension)   . Hyperlipidemia   . Obesity 08/10/2014  . Obesity   . Pain in both feet    arthritis / Hallox Rigidus  . Perimenopausal 11/18/2013  . Raynaud's disease   . Screening for cervical cancer 02/06/2015  . Sleep apnea   . SOB (shortness of breath)   . Vitamin D deficiency     PAST SURGICAL HISTORY: Past Surgical History:  Procedure Laterality Date  . CESAREAN SECTION   2003  . TONSILLECTOMY  1980    SOCIAL HISTORY: Social History  Substance Use Topics  . Smoking status: Never Smoker  . Smokeless tobacco: Never Used  . Alcohol use No     Comment: quit ETOH on 2013    FAMILY HISTORY: Family History  Problem Relation Age of Onset  . Kidney failure Father 33       deceased  . Kidney disease Father   . Obesity Father   . Seizures Father   . Heart disease Father 72  . Hypertension Father   . Hyperlipidemia Father   . Stroke Father   . Sleep apnea Father   . Schizophrenia Brother        67yo  . Alcohol abuse Brother   . Other Brother        chronic pain, 33 yo  . Stroke Brother   . Alcohol abuse Sister        44yo  . Obesity Paternal Grandfather   . Stroke Sister   . Depression Mother   . Anxiety disorder Mother   . Obesity Mother     ROS: Review of Systems  Constitutional: Negative for weight loss.  Respiratory: Negative for shortness of breath.   Cardiovascular: Negative for chest pain.  Gastrointestinal: Positive for constipation. Negative for melena.       Negative hard or painful Negative hematochezia  Psychiatric/Behavioral: Positive for depression. Negative for suicidal ideas.    PHYSICAL EXAM: Blood pressure 127/73, pulse 79, temperature 98.8 F (37.1 C), temperature source Oral, height 5\' 4"  (1.626 m), weight 181 lb (82.1 kg), last menstrual period 10/26/2016, SpO2 98 %. Body mass index is 31.07 kg/m. Physical Exam  Constitutional: She is oriented to person, place, and time. She appears well-developed and well-nourished.  Cardiovascular: Normal rate.   Pulmonary/Chest: Effort normal.  Musculoskeletal: Normal range of motion.  Neurological: She is oriented to person, place, and time.  Skin: Skin is warm and dry.  Psychiatric: She has a normal mood and affect. Her behavior is normal.  Vitals reviewed.   RECENT LABS AND TESTS: BMET    Component Value Date/Time   NA 139 08/29/2016 1110   K 3.7 08/29/2016 1110     CL 98 08/29/2016 1110   CO2 24 08/29/2016 1110   GLUCOSE 88 08/29/2016 1110   GLUCOSE 114 (H) 04/12/2016 1558   BUN 20 08/29/2016 1110   CREATININE 0.71 08/29/2016 1110   CREATININE 0.70 11/07/2012 1009   CALCIUM 9.8 08/29/2016 1110   GFRNONAA 98 08/29/2016 1110  GFRAA 112 08/29/2016 1110   Lab Results  Component Value Date   HGBA1C 5.5 08/29/2016   HGBA1C 5.6 05/02/2016   Lab Results  Component Value Date   INSULIN 8.8 08/29/2016   INSULIN 8.1 05/02/2016   CBC    Component Value Date/Time   WBC 10.1 04/12/2016 1558   RBC 4.68 04/12/2016 1558   HGB 13.0 04/12/2016 1558   HCT 39.1 04/12/2016 1558   PLT 252.0 04/12/2016 1558   MCV 83.4 04/12/2016 1558   MCH 28.3 11/07/2012 1009   MCHC 33.3 04/12/2016 1558   RDW 13.5 04/12/2016 1558   LYMPHSABS 2.8 04/12/2016 1558   MONOABS 0.8 04/12/2016 1558   EOSABS 0.4 04/12/2016 1558   BASOSABS 0.1 04/12/2016 1558   Iron/TIBC/Ferritin/ %Sat No results found for: IRON, TIBC, FERRITIN, IRONPCTSAT Lipid Panel     Component Value Date/Time   CHOL 190 08/29/2016 1110   TRIG 102 08/29/2016 1110   HDL 47 08/29/2016 1110   CHOLHDL 6 04/12/2016 1558   VLDL 59.0 (H) 04/12/2016 1558   LDLCALC 123 (H) 08/29/2016 1110   LDLDIRECT 130.0 04/12/2016 1558   Hepatic Function Panel     Component Value Date/Time   PROT 7.2 08/29/2016 1110   ALBUMIN 4.6 08/29/2016 1110   AST 21 08/29/2016 1110   ALT 22 08/29/2016 1110   ALKPHOS 65 08/29/2016 1110   BILITOT <0.2 08/29/2016 1110   BILIDIR 0.0 11/15/2013 1504      Component Value Date/Time   TSH 1.15 04/12/2016 1558   TSH 1.65 05/07/2015 1123   TSH 1.85 11/19/2014 1030    ASSESSMENT AND PLAN: Other constipation  Chest pain, unspecified type  At risk for heart disease  Class 1 obesity with serious comorbidity and body mass index (BMI) of 31.0 to 31.9 in adult, unspecified obesity type  PLAN:  Constipation Cheryl Taylor was informed decrease bowel movement frequency is normal  while losing weight, but stools should not be hard or painful. She was advised to increase her H20 intake and work on increasing her fiber intake. High fiber foods were discussed today. Cheryl Taylor agrees to start miralax 1 capful daily and she will follow up with our clinic in 3 weeks.  Chest pain Patient is counseled on her significant risk of stroke giving her medial history of DMII, HTN, Hyperlipidemia, and obesity.  Patient is advised to continue her current medications, to follow up with Cardiologist as recommended and to have Carotid duplex for further evaluation of bruit.   Cardiovascular risk counselling Cheryl Taylor was given extended (15 minutes) coronary artery disease prevention counseling today. She is 51 y.o. female and has risk factors for heart disease including obesity. We discussed intensive lifestyle modifications today with an emphasis on specific weight loss instructions and strategies. Pt was also informed of the importance of increasing exercise and decreasing saturated fats to help prevent heart disease.  Obesity Cheryl Taylor is currently in the action stage of change. As such, her goal is to continue with weight loss efforts She has agreed to follow the Category 2 plan Cheryl Taylor has been instructed to work up to a goal of 150 minutes of combined cardio and strengthening exercise per week for weight loss and overall health benefits. We discussed the following Behavioral Modification Strategies today: increasing lean protein intake and work on meal planning and easy cooking plans    Cheryl Taylor has agreed to follow up with our clinic in 3 weeks. She was informed of the importance of frequent follow up visits to maximize her  success with intensive lifestyle modifications for her multiple health conditions.  I, Trixie Dredge, am acting as transcriptionist for Cheryl Duverney, PA-C  I have reviewed the above documentation for accuracy and completeness, and I agree with the above. -Cheryl Duverney,  PA-C  I have reviewed the above note and agree with the plan. -Dennard Nip, MD     Today's visit was # 12 out of 22.  Starting weight: 194 lbs Starting date: 05/02/16  Today's weight : 181 lbs  Today's date: 11/23/2016 Total lbs lost to date: 13 (Patients must lose 7 lbs in the first 6 months to continue with counseling)   ASK: We discussed the diagnosis of obesity with Cheryl Taylor today and Cheryl Taylor agreed to give [place] permission to discuss obesity behavioral modification therapy today.  ASSESS: Cheryl Taylor has the diagnosis of obesity and her BMI today is 31.05 Cheryl Taylor is in the action stage of change   ADVISE: Cheryl Taylor was educated on the multiple health risks of obesity as well as the benefit of weight loss to improve her health. She was advised of the need for long term treatment and the importance of lifestyle modifications.  AGREE: Multiple dietary modification options and treatment options were discussed and  Cheryl Taylor agreed to follow the Category 2 plan We discussed the following Behavioral Modification Strategies today: increasing lean protein intake and work on meal planning and easy cooking plans

## 2016-12-04 ENCOUNTER — Other Ambulatory Visit (INDEPENDENT_AMBULATORY_CARE_PROVIDER_SITE_OTHER): Payer: Self-pay | Admitting: Physician Assistant

## 2016-12-04 DIAGNOSIS — F3289 Other specified depressive episodes: Secondary | ICD-10-CM

## 2016-12-05 MED ORDER — BUPROPION HCL ER (SR) 200 MG PO TB12: 200.0000 mg | ORAL_TABLET | Freq: Every day | ORAL | 0 refills | Status: DC

## 2016-12-15 ENCOUNTER — Ambulatory Visit (INDEPENDENT_AMBULATORY_CARE_PROVIDER_SITE_OTHER): Payer: Managed Care, Other (non HMO) | Admitting: Physician Assistant

## 2016-12-15 VITALS — BP 117/62 | HR 61 | Temp 98.0°F | Ht 64.0 in | Wt 179.0 lb

## 2016-12-15 DIAGNOSIS — E7849 Other hyperlipidemia: Secondary | ICD-10-CM | POA: Diagnosis not present

## 2016-12-15 DIAGNOSIS — Z9189 Other specified personal risk factors, not elsewhere classified: Secondary | ICD-10-CM

## 2016-12-15 DIAGNOSIS — Z6830 Body mass index (BMI) 30.0-30.9, adult: Secondary | ICD-10-CM | POA: Diagnosis not present

## 2016-12-15 DIAGNOSIS — E8881 Metabolic syndrome: Secondary | ICD-10-CM | POA: Diagnosis not present

## 2016-12-15 DIAGNOSIS — E669 Obesity, unspecified: Secondary | ICD-10-CM | POA: Diagnosis not present

## 2016-12-15 DIAGNOSIS — F3289 Other specified depressive episodes: Secondary | ICD-10-CM

## 2016-12-15 MED ORDER — METFORMIN HCL 500 MG PO TABS: 500.0000 mg | ORAL_TABLET | Freq: Two times a day (BID) | ORAL | 0 refills | Status: DC

## 2016-12-15 MED ORDER — BUPROPION HCL ER (SR) 200 MG PO TB12: 200.0000 mg | ORAL_TABLET | Freq: Every day | ORAL | 0 refills | Status: DC

## 2016-12-15 MED ORDER — ATORVASTATIN CALCIUM 20 MG PO TABS
20.0000 mg | ORAL_TABLET | Freq: Every day | ORAL | 0 refills | Status: DC
Start: 2016-12-15 — End: 2016-12-29

## 2016-12-15 NOTE — Progress Notes (Signed)
Office: [phone]  /  Fax: [phone]   HPI:   Chief Complaint: OBESITY Cheryl Taylor is here to discuss her progress with her obesity treatment plan. She is on the Category 2 plan and is following her eating plan approximately 60 % of the time. She states she is exercising 0 minutes 0 times per week. Cheryl Taylor continues to do well with weight loss. She would like more variety with her meals and more convenience options. Her weight is 179 lb (81.2 kg) today and has had a weight loss of 2 pounds over a period of 3 weeks since her last visit. She has lost 15 lbs since starting treatment with [place].  Hyperlipidemia Cheryl Taylor has hyperlipidemia and she is currently on Lipitor. She has been trying to improve her cholesterol levels with intensive lifestyle modification including a low saturated fat diet, exercise and weight loss. She denies any chest pain, claudication or myalgias.  Insulin Resistance Cheryl Taylor has a diagnosis of insulin resistance based on her elevated fasting insulin level >5. Although Cheryl Taylor's blood glucose readings are still under good control, insulin resistance puts her at greater risk of metabolic syndrome and diabetes. She is taking metformin currently and continues to work on diet and exercise to decrease risk of diabetes. Cris denies polyphagia.  At risk for diabetes Cheryl Taylor is at higher than average risk for developing diabetes due to her obesity and insulin resistance. She currently denies polyuria or polydipsia.  Depression with emotional eating behaviors Cheryl Taylor is struggling with emotional eating and using food for comfort to the extent that it is negatively impacting her health. She often snacks when she is not hungry. Cheryl Taylor sometimes feels she is out of control and then feels guilty that she made poor food choices. She has been working on behavior modification techniques to help reduce her emotional eating and has been somewhat successful. Her mood is stable  and she shows no sign of suicidal or homicidal ideations.  Depression screen Cheryl Taylor 2/9 05/02/2016 05/22/2015  Decreased Interest 3 0  Down, Depressed, Hopeless 3 0  PHQ - 2 Score 6 0  Altered sleeping 3 -  Tired, decreased energy 3 -  Change in appetite 3 -  Feeling bad or failure about yourself  1 -  Trouble concentrating 0 -  Moving slowly or fidgety/restless 1 -  Suicidal thoughts 1 -  PHQ-9 Score 18 -      ALLERGIES: No Known Allergies  MEDICATIONS: Current Outpatient Medications on File Prior to Visit  Medication Sig Dispense Refill  . aspirin EC 81 MG tablet Take 1 tablet (81 mg total) by mouth daily.    Marland Kitchen atorvastatin (LIPITOR) 20 MG tablet Take 1 tablet (20 mg total) by mouth daily. 90 tablet 1  . buPROPion (WELLBUTRIN SR) 200 MG 12 hr tablet Take 1 tablet (200 mg total) daily at 12 noon by mouth. 30 tablet 0  . escitalopram (LEXAPRO) 20 MG tablet TAKE 1 TABLET DAILY 90 tablet 0  . Krill Oil 350 MG CAPS Take 350 mg by mouth every morning.    . Melatonin 10 MG TABS Take 10 mg by mouth at bedtime.    . metFORMIN (GLUCOPHAGE) 500 MG tablet Take 1 tablet (500 mg total) by mouth 2 (two) times daily with a meal. 60 tablet 0  . metoprolol succinate (TOPROL-XL) 50 MG 24 hr tablet TAKE 1 TABLET DAILY (TAKE WITH OR IMMEDIATELY FOLLOWING A MEAL) 30 tablet 6  . Naproxen-Esomeprazole (VIMOVO) 500-20 MG TBEC Take 500 mg by mouth 2 (  two) times daily.    . nitroGLYCERIN (NITROSTAT) 0.4 MG SL tablet Place 1 tablet (0.4 mg total) under the tongue every 5 (five) minutes as needed for chest pain. 50 tablet 3  . polyethylene glycol powder (GLYCOLAX/MIRALAX) powder Take 17 g by mouth daily. 3350 g 0  . ranitidine (ZANTAC) 300 MG tablet Take 1 tablet (300 mg total) by mouth at bedtime. 90 tablet 1  . Tryptophan 500 MG TABS Take 500 mg by mouth 2 times daily at 12 noon and 4 pm.    . valACYclovir (VALTREX) 1000 MG tablet TAKE 1 TABLET DAILY 90 tablet 2  . Vitamin D-Vitamin K (D3 + K2 DOTS PO) Take  42 mg by mouth 2 (two) times daily.     No current facility-administered medications on file prior to visit.     PAST MEDICAL HISTORY: Past Medical History:  Diagnosis Date  . Alcohol abuse, in remission   . Anger   . Anxiety   . Back pain   . Chest pain   . Depression   . Diabetes mellitus without complication ([place])   . Drug use   . Fatigue 02/15/2015  . GERD (gastroesophageal reflux disease)   . HTN (hypertension)   . Hyperlipidemia   . Obesity 08/10/2014  . Obesity   . Pain in both feet    arthritis / Hallox Rigidus  . Perimenopausal 11/18/2013  . Raynaud's disease   . Screening for cervical cancer 02/06/2015  . Sleep apnea   . SOB (shortness of breath)   . Vitamin D deficiency     PAST SURGICAL HISTORY: Past Surgical History:  Procedure Laterality Date  . CESAREAN SECTION  2003  . TONSILLECTOMY  1980    SOCIAL HISTORY: Social History   Tobacco Use  . Smoking status: Never Smoker  . Smokeless tobacco: Never Used  Substance Use Topics  . Alcohol use: No    Alcohol/week: 30.0 oz    Types: 50 Glasses of wine per week    Comment: quit ETOH on 2013  . Drug use: No    Comment: Ativan 4-5mg  daily, no longer taking    FAMILY HISTORY: Family History  Problem Relation Age of Onset  . Kidney failure Father 26       deceased  . Kidney disease Father   . Obesity Father   . Seizures Father   . Heart disease Father 59  . Hypertension Father   . Hyperlipidemia Father   . Stroke Father   . Sleep apnea Father   . Schizophrenia Brother        69yo  . Alcohol abuse Brother   . Other Brother        chronic pain, 71 yo  . Stroke Brother   . Alcohol abuse Sister        30yo  . Obesity Paternal Grandfather   . Stroke Sister   . Depression Mother   . Anxiety disorder Mother   . Obesity Mother     ROS: Review of Systems  Constitutional: Positive for weight loss.  Cardiovascular: Negative for chest pain and claudication.  Genitourinary: Negative for  frequency.  Musculoskeletal: Negative for myalgias.  Endo/Heme/Allergies: Negative for polydipsia.       Negative polyphagia  Psychiatric/Behavioral: Positive for depression. Negative for suicidal ideas.    PHYSICAL EXAM: Blood pressure 117/62, pulse 61, temperature 98 F (36.7 C), height 5\' 4"  (1.626 m), weight 179 lb (81.2 kg), SpO2 100 %. Body mass index is 30.73 kg/m. Physical  Exam  Constitutional: She is oriented to person, place, and time. She appears well-developed and well-nourished.  Cardiovascular: Normal rate.  Pulmonary/Chest: Effort normal.  Musculoskeletal: Normal range of motion.  Neurological: She is oriented to person, place, and time.  Skin: Skin is warm and dry.  Psychiatric: She has a normal mood and affect. Her behavior is normal.  Vitals reviewed.   RECENT LABS AND TESTS: BMET    Component Value Date/Time   NA 139 08/29/2016 1110   K 3.7 08/29/2016 1110   CL 98 08/29/2016 1110   CO2 24 08/29/2016 1110   GLUCOSE 88 08/29/2016 1110   GLUCOSE 114 (H) 04/12/2016 1558   BUN 20 08/29/2016 1110   CREATININE 0.71 08/29/2016 1110   CREATININE 0.70 11/07/2012 1009   CALCIUM 9.8 08/29/2016 1110   GFRNONAA 98 08/29/2016 1110   GFRAA 112 08/29/2016 1110   Lab Results  Component Value Date   HGBA1C 5.5 08/29/2016   HGBA1C 5.6 05/02/2016   Lab Results  Component Value Date   INSULIN 8.8 08/29/2016   INSULIN 8.1 05/02/2016   CBC    Component Value Date/Time   WBC 10.1 04/12/2016 1558   RBC 4.68 04/12/2016 1558   HGB 13.0 04/12/2016 1558   HCT 39.1 04/12/2016 1558   PLT 252.0 04/12/2016 1558   MCV 83.4 04/12/2016 1558   MCH 28.3 11/07/2012 1009   MCHC 33.3 04/12/2016 1558   RDW 13.5 04/12/2016 1558   LYMPHSABS 2.8 04/12/2016 1558   MONOABS 0.8 04/12/2016 1558   EOSABS 0.4 04/12/2016 1558   BASOSABS 0.1 04/12/2016 1558   Iron/TIBC/Ferritin/ %Sat No results found for: IRON, TIBC, FERRITIN, IRONPCTSAT Lipid Panel     Component Value Date/Time     CHOL 190 08/29/2016 1110   TRIG 102 08/29/2016 1110   HDL 47 08/29/2016 1110   CHOLHDL 6 04/12/2016 1558   VLDL 59.0 (H) 04/12/2016 1558   LDLCALC 123 (H) 08/29/2016 1110   LDLDIRECT 130.0 04/12/2016 1558   Hepatic Function Panel     Component Value Date/Time   PROT 7.2 08/29/2016 1110   ALBUMIN 4.6 08/29/2016 1110   AST 21 08/29/2016 1110   ALT 22 08/29/2016 1110   ALKPHOS 65 08/29/2016 1110   BILITOT <0.2 08/29/2016 1110   BILIDIR 0.0 11/15/2013 1504      Component Value Date/Time   TSH 1.15 04/12/2016 1558   TSH 1.65 05/07/2015 1123   TSH 1.85 11/19/2014 1030    ASSESSMENT AND PLAN: Other hyperlipidemia - Plan: atorvastatin (LIPITOR) 20 MG tablet  Insulin resistance - Plan: metFORMIN (GLUCOPHAGE) 500 MG tablet  Other depression - with emotional eating - Plan: buPROPion (WELLBUTRIN SR) 200 MG 12 hr tablet  At risk for diabetes mellitus  Class 1 obesity with serious comorbidity and body mass index (BMI) of 30.0 to 30.9 in adult, unspecified obesity type  PLAN:  Hyperlipidemia Cheryl Taylor was informed of the American Heart Association Guidelines emphasizing intensive lifestyle modifications as the first line treatment for hyperlipidemia. We discussed many lifestyle modifications today in depth, and Cheryl Taylor will continue to work on decreasing saturated fats such as fatty red meat, butter and many fried foods. She will also increase vegetables and lean protein in her diet and continue to work on exercise and weight loss efforts. Ajee agrees to continue Lipitor 20 mg qd #30 with no refills and will follow up with our clinic in 2 weeks.  Insulin Resistance Cheryl Taylor will continue to work on weight loss, exercise, and decreasing simple carbohydrates in  her diet to help decrease the risk of diabetes. We dicussed metformin including benefits and risks. She was informed that eating too many simple carbohydrates or too many calories at one sitting increases the likelihood of GI  side effects. Cheryl Taylor requested metformin for now and prescription was written today for 1 month refill. Cheryl Taylor agreed to follow up with [place] as directed to monitor her progress.  Diabetes risk counseling Cheryl Taylor was given extended (15 minutes) diabetes prevention counseling today. She is 51 y.o. female and has risk factors for diabetes including obesity and insulin resistance. We discussed intensive lifestyle modifications today with an emphasis on weight loss as well as increasing exercise and decreasing simple carbohydrates in her diet.  Depression with Emotional Eating Behaviors We discussed behavior modification techniques today to help Maty deal with her emotional eating and depression. She has agreed to take Wellbutrin SR 200 mg qd #30 with no refills and will follow up as directed.  Obesity Cheryl Taylor is currently in the action stage of change. As such, her goal is to continue with weight loss efforts She has agreed to keep a food journal with 1200 calories and 85 grams of protein daily Cheryl Taylor has been instructed to work up to a goal of 150 minutes of combined cardio and strengthening exercise per week for weight loss and overall health benefits. We discussed the following Behavioral Modification Strategies today: increasing lean protein intake and work on meal planning and easy cooking plans  Cheryl Taylor has agreed to follow up with our clinic in 2 weeks. She was informed of the importance of frequent follow up visits to maximize her success with intensive lifestyle modifications for her multiple health conditions.  I, Doreene Nest, am acting as transcriptionist for Cheryl Duverney, PA-C  I have reviewed the above documentation for accuracy and completeness, and I agree with the above. -Cheryl Duverney, PA-C  I have reviewed the above note and agree with the plan. -Cheryl Nip, MD   OBESITY BEHAVIORAL INTERVENTION VISIT  Today's visit was # 13 out of 22.  Starting weight: 194  lbs Starting date: 05/02/16 Today's weight : 179 lbs  Today's date: 12/15/2016 Total lbs lost to date: 15 (Patients must lose 7 lbs in the first 6 months to continue with counseling)   ASK: We discussed the diagnosis of obesity with Cheryl Taylor today and Cheryl Taylor agreed to give [place] permission to discuss obesity behavioral modification therapy today.  ASSESS: Cheryl Taylor has the diagnosis of obesity and her BMI today is 30.71 Cheryl Taylor is in the action stage of change   ADVISE: Cheryl Taylor was educated on the multiple health risks of obesity as well as the benefit of weight loss to improve her health. She was advised of the need for long term treatment and the importance of lifestyle modifications.  AGREE: Multiple dietary modification options and treatment options were discussed and  Cheryl Taylor agreed to keep a food journal with 1200 calories and 85 grams of protein daily We discussed the following Behavioral Modification Strategies today: increasing lean protein intake and work on meal planning and easy cooking plans

## 2016-12-16 ENCOUNTER — Other Ambulatory Visit: Payer: Self-pay | Admitting: Family Medicine

## 2016-12-19 ENCOUNTER — Ambulatory Visit: Payer: Self-pay | Admitting: Cardiology

## 2016-12-29 ENCOUNTER — Ambulatory Visit (INDEPENDENT_AMBULATORY_CARE_PROVIDER_SITE_OTHER): Payer: Managed Care, Other (non HMO) | Admitting: Physician Assistant

## 2016-12-29 ENCOUNTER — Encounter (INDEPENDENT_AMBULATORY_CARE_PROVIDER_SITE_OTHER): Payer: Self-pay

## 2016-12-29 ENCOUNTER — Ambulatory Visit (INDEPENDENT_AMBULATORY_CARE_PROVIDER_SITE_OTHER): Payer: Managed Care, Other (non HMO) | Admitting: Family Medicine

## 2016-12-29 DIAGNOSIS — I10 Essential (primary) hypertension: Secondary | ICD-10-CM | POA: Diagnosis not present

## 2016-12-29 DIAGNOSIS — Z6831 Body mass index (BMI) 31.0-31.9, adult: Secondary | ICD-10-CM | POA: Diagnosis not present

## 2016-12-29 DIAGNOSIS — Z9189 Other specified personal risk factors, not elsewhere classified: Secondary | ICD-10-CM | POA: Diagnosis not present

## 2016-12-29 DIAGNOSIS — E8881 Metabolic syndrome: Secondary | ICD-10-CM

## 2016-12-29 DIAGNOSIS — F3289 Other specified depressive episodes: Secondary | ICD-10-CM

## 2016-12-29 DIAGNOSIS — E7849 Other hyperlipidemia: Secondary | ICD-10-CM | POA: Diagnosis not present

## 2016-12-29 DIAGNOSIS — E669 Obesity, unspecified: Secondary | ICD-10-CM

## 2016-12-29 MED ORDER — METOPROLOL SUCCINATE ER 50 MG PO TB24: ORAL_TABLET | ORAL | 6 refills | Status: DC

## 2016-12-29 MED ORDER — METFORMIN HCL 500 MG PO TABS: 500.0000 mg | ORAL_TABLET | Freq: Two times a day (BID) | ORAL | 0 refills | Status: DC

## 2016-12-29 MED ORDER — BUPROPION HCL ER (SR) 200 MG PO TB12: 200.0000 mg | ORAL_TABLET | Freq: Every day | ORAL | 0 refills | Status: DC

## 2016-12-29 MED ORDER — ATORVASTATIN CALCIUM 20 MG PO TABS: 20.0000 mg | ORAL_TABLET | Freq: Every day | ORAL | 0 refills | Status: DC

## 2016-12-29 NOTE — Progress Notes (Signed)
Office: [phone]  /  Fax: [phone]   HPI:   Chief Complaint: OBESITY Capucine is here to discuss her progress with her obesity treatment plan. She is on the keep a food journal with 1200 calories and 85 grams of protein daily and is following her eating plan approximately 20 % of the time. She states she is exercising 0 minutes 0 times per week. Kila was off track over Thanksgiving. She was not journaling and she increased snacking. Lillyth is now not working and she has extra time at home. Her weight is 182 lb (82.6 kg) today and has had a weight gain of 3 pounds over a period of 2 weeks since her last visit. She has lost 12 lbs since starting treatment with [place].  Insulin Resistance Romesha has a diagnosis of insulin resistance based on her elevated fasting insulin level >5. Although Jeriah's blood glucose readings are still under good control, insulin resistance puts her at greater risk of metabolic syndrome and diabetes. She is stable on metformin currently. Khamil is not following her diet prescription closely. She continues to work on diet and exercise to decrease risk of diabetes.  At risk for diabetes [place]una is at higher than average risk for developing diabetes due to her obesity and insulin resistance. She currently denies polyuria or polydipsia.  Hypertension Rachella Basden is a 51 y.o. female with hypertension.  Tyreonna Czaplicki denies chest pain or shortness of breath on exertion. She is working weight loss to help control her blood pressure with the goal of decreasing her risk of heart attack and stroke. Jennifers blood pressure is currently stable on Metoprolol.  Hyperlipidemia Francy has hyperlipidemia and has been trying to improve her cholesterol levels with intensive lifestyle modification including a low saturated fat diet, exercise and weight loss. She denies any chest pain, headache, claudication or myalgias.  Depression with emotional eating  behaviors Kima is struggling with emotional eating and using food for comfort to the extent that it is negatively impacting her health. She often snacks when she is not hungry. Jessel sometimes feels she is out of control and then feels guilty that she made poor food choices. She has been working on behavior modification techniques to help reduce her emotional eating and has been somewhat successful. She shows no sign of suicidal or homicidal ideations.  Depression screen [facility] 2/9 05/02/2016 05/22/2015  Decreased Interest 3 0  Down, Depressed, Hopeless 3 0  PHQ - 2 Score 6 0  Altered sleeping 3 -  Tired, decreased energy 3 -  Change in appetite 3 -  Feeling bad or failure about yourself  1 -  Trouble concentrating 0 -  Moving slowly or fidgety/restless 1 -  Suicidal thoughts 1 -  PHQ-9 Score 18 -      ALLERGIES: No Known Allergies  MEDICATIONS: Current Outpatient Medications on File Prior to Visit  Medication Sig Dispense Refill  . aspirin EC 81 MG tablet Take 1 tablet (81 mg total) by mouth daily.    Marland Kitchen escitalopram (LEXAPRO) 20 MG tablet TAKE 1 TABLET DAILY 90 tablet 0  . Krill Oil 350 MG CAPS Take 350 mg by mouth every morning.    . Melatonin 10 MG TABS Take 10 mg by mouth at bedtime.    . Naproxen-Esomeprazole (VIMOVO) 500-20 MG TBEC Take 500 mg by mouth 2 (two) times daily.    . nitroGLYCERIN (NITROSTAT) 0.4 MG SL tablet Place 1 tablet (0.4 mg total) under the tongue every 5 (five) minutes as  needed for chest pain. 50 tablet 3  . polyethylene glycol powder (GLYCOLAX/MIRALAX) powder Take 17 g by mouth daily. 3350 g 0  . ranitidine (ZANTAC) 300 MG tablet TAKE 1 TABLET AT BEDTIME 90 tablet 1  . Tryptophan 500 MG TABS Take 500 mg by mouth 2 times daily at 12 noon and 4 pm.    . valACYclovir (VALTREX) 1000 MG tablet TAKE 1 TABLET DAILY 90 tablet 2  . Vitamin D-Vitamin K (D3 + K2 DOTS PO) Take 42 mg by mouth 2 (two) times daily.     No current facility-administered medications on  file prior to visit.     PAST MEDICAL HISTORY: Past Medical History:  Diagnosis Date  . Alcohol abuse, in remission   . Anger   . Anxiety   . Back pain   . Chest pain   . Depression   . Diabetes mellitus without complication ([place])   . Drug use   . Fatigue 02/15/2015  . GERD (gastroesophageal reflux disease)   . HTN (hypertension)   . Hyperlipidemia   . Obesity 08/10/2014  . Obesity   . Pain in both feet    arthritis / Hallox Rigidus  . Perimenopausal 11/18/2013  . Raynaud's disease   . Screening for cervical cancer 02/06/2015  . Sleep apnea   . SOB (shortness of breath)   . Vitamin D deficiency     PAST SURGICAL HISTORY: Past Surgical History:  Procedure Laterality Date  . CESAREAN SECTION  2003  . TONSILLECTOMY  1980    SOCIAL HISTORY: Social History   Tobacco Use  . Smoking status: Never Smoker  . Smokeless tobacco: Never Used  Substance Use Topics  . Alcohol use: No    Alcohol/week: 30.0 oz    Types: 50 Glasses of wine per week    Comment: quit ETOH on 2013  . Drug use: No    Comment: Ativan 4-5mg  daily, no longer taking    FAMILY HISTORY: Family History  Problem Relation Age of Onset  . Kidney failure Father 30       deceased  . Kidney disease Father   . Obesity Father   . Seizures Father   . Heart disease Father 39  . Hypertension Father   . Hyperlipidemia Father   . Stroke Father   . Sleep apnea Father   . Schizophrenia Brother        78yo  . Alcohol abuse Brother   . Other Brother        chronic pain, 20 yo  . Stroke Brother   . Alcohol abuse Sister        63yo  . Obesity Paternal Grandfather   . Stroke Sister   . Depression Mother   . Anxiety disorder Mother   . Obesity Mother     ROS: Review of Systems  Constitutional: Negative for weight loss.  Respiratory: Negative for shortness of breath (on exertion).   Cardiovascular: Negative for chest pain and claudication.  Genitourinary: Negative for frequency.  Musculoskeletal:  Negative for myalgias.  Neurological: Negative for headaches.  Endo/Heme/Allergies: Negative for polydipsia.  Psychiatric/Behavioral: Positive for depression. Negative for suicidal ideas.    PHYSICAL EXAM: Last menstrual period 12/01/2016. There is no height or weight on file to calculate BMI. Physical Exam  Constitutional: She is oriented to person, place, and time. She appears well-developed and well-nourished.  Cardiovascular: Normal rate.  Pulmonary/Chest: Effort normal.  Musculoskeletal: Normal range of motion.  Neurological: She is oriented to person, place, and  time.  Skin: Skin is warm and dry.  Psychiatric: She has a normal mood and affect. Her behavior is normal.  Vitals reviewed.   RECENT LABS AND TESTS: BMET    Component Value Date/Time   NA 139 08/29/2016 1110   K 3.7 08/29/2016 1110   CL 98 08/29/2016 1110   CO2 24 08/29/2016 1110   GLUCOSE 88 08/29/2016 1110   GLUCOSE 114 (H) 04/12/2016 1558   BUN 20 08/29/2016 1110   CREATININE 0.71 08/29/2016 1110   CREATININE 0.70 11/07/2012 1009   CALCIUM 9.8 08/29/2016 1110   GFRNONAA 98 08/29/2016 1110   GFRAA 112 08/29/2016 1110   Lab Results  Component Value Date   HGBA1C 5.5 08/29/2016   HGBA1C 5.6 05/02/2016   Lab Results  Component Value Date   INSULIN 8.8 08/29/2016   INSULIN 8.1 05/02/2016   CBC    Component Value Date/Time   WBC 10.1 04/12/2016 1558   RBC 4.68 04/12/2016 1558   HGB 13.0 04/12/2016 1558   HCT 39.1 04/12/2016 1558   PLT 252.0 04/12/2016 1558   MCV 83.4 04/12/2016 1558   MCH 28.3 11/07/2012 1009   MCHC 33.3 04/12/2016 1558   RDW 13.5 04/12/2016 1558   LYMPHSABS 2.8 04/12/2016 1558   MONOABS 0.8 04/12/2016 1558   EOSABS 0.4 04/12/2016 1558   BASOSABS 0.1 04/12/2016 1558   Iron/TIBC/Ferritin/ %Sat No results found for: IRON, TIBC, FERRITIN, IRONPCTSAT Lipid Panel     Component Value Date/Time   CHOL 190 08/29/2016 1110   TRIG 102 08/29/2016 1110   HDL 47 08/29/2016 1110    CHOLHDL 6 04/12/2016 1558   VLDL 59.0 (H) 04/12/2016 1558   LDLCALC 123 (H) 08/29/2016 1110   LDLDIRECT 130.0 04/12/2016 1558   Hepatic Function Panel     Component Value Date/Time   PROT 7.2 08/29/2016 1110   ALBUMIN 4.6 08/29/2016 1110   AST 21 08/29/2016 1110   ALT 22 08/29/2016 1110   ALKPHOS 65 08/29/2016 1110   BILITOT <0.2 08/29/2016 1110   BILIDIR 0.0 11/15/2013 1504      Component Value Date/Time   TSH 1.15 04/12/2016 1558   TSH 1.65 05/07/2015 1123   TSH 1.85 11/19/2014 1030    ASSESSMENT AND PLAN: Other hyperlipidemia - Plan: atorvastatin (LIPITOR) 20 MG tablet  Essential hypertension - Plan: metoprolol succinate (TOPROL-XL) 50 MG 24 hr tablet  Insulin resistance - Plan: metFORMIN (GLUCOPHAGE) 500 MG tablet  Other depression - with emotional eating - Plan: buPROPion (WELLBUTRIN SR) 200 MG 12 hr tablet  At risk for diabetes mellitus  Class 1 obesity with serious comorbidity and body mass index (BMI) of 31.0 to 31.9 in adult, unspecified obesity type  PLAN:  Insulin Resistance Masie will continue to work on weight loss, exercise, and decreasing simple carbohydrates in her diet to help decrease the risk of diabetes. We dicussed metformin including benefits and risks. She was informed that eating too many simple carbohydrates or too many calories at one sitting increases the likelihood of GI side effects. Anderson Malta requested metformin for now and prescription was written today for 1 month refill. Glenette agreed to follow up with [place] as directed to monitor her progress.  Diabetes risk counseling Stephaine was given extended (15 minutes) diabetes prevention counseling today. She is 51 y.o. female and has risk factors for diabetes including obesity and insulin resistance. We discussed intensive lifestyle modifications today with an emphasis on weight loss as well as increasing exercise and decreasing simple carbohydrates in her diet.  Hypertension We discussed sodium  restriction, working on healthy weight loss, and a regular exercise program as the means to achieve improved blood pressure control. Mauriah agreed with this plan and agreed to follow up as directed. We will continue to monitor her blood pressure as well as her progress with the above lifestyle modifications. She agrees to continue metoprolol 50 mg qd #30 with no refills and will watch for signs of hypotension as she continues her lifestyle modifications.  Hyperlipidemia Skylynn was informed of the American Heart Association Guidelines emphasizing intensive lifestyle modifications as the first line treatment for hyperlipidemia. We discussed many lifestyle modifications today in depth, and Zamyiah will continue to work on decreasing saturated fats such as fatty red meat, butter and many fried foods. She will also increase vegetables and lean protein in her diet and continue to work on exercise and weight loss efforts. Magaline agrees to continue Lipitor 20 mg qd #30 with no refills and will follow up with our clinic in 2 weeks.  Depression with Emotional Eating Behaviors We discussed behavior modification techniques today to help Penelope deal with her emotional eating and depression. She has agreed to continue Bupropion, we will refill for 1 month and she will  follow up as directed.  Obesity Shirla is currently in the action stage of change. As such, her goal is to continue with weight loss efforts She has agreed to keep a food journal with 1200 calories and 85+ grams of protein daily Elvi has been instructed to work up to a goal of 150 minutes of combined cardio and strengthening exercise per week for weight loss and overall health benefits. We discussed the following Behavioral Modification Strategies today: increasing lean protein intake, decrease eating out, dealing with family or coworker sabotage and emotional eating strategies  Renette has agreed to follow up with our clinic in 2  weeks fasting. She was informed of the importance of frequent follow up visits to maximize her success with intensive lifestyle modifications for her multiple health conditions.  I, Doreene Nest, am acting as transcriptionist for Dennard Nip, MD  I have reviewed the above documentation for accuracy and completeness, and I agree with the above. -Dennard Nip, MD    OBESITY BEHAVIORAL INTERVENTION VISIT  Today's visit was # 14 out of 22.  Starting weight: 194 lbs Starting date: 05/02/16 Today's weight : 182 lbs  Today's date: 12/29/2016 Total lbs lost to date: 12 (Patients must lose 7 lbs in the first 6 months to continue with counseling)   ASK: We discussed the diagnosis of obesity with Lolita Patella today and Fruma agreed to give [place] permission to discuss obesity behavioral modification therapy today.  ASSESS: Yvett has the diagnosis of obesity and her BMI today is 31.22 Sung is in the action stage of change   ADVISE: Annamarie was educated on the multiple health risks of obesity as well as the benefit of weight loss to improve her health. She was advised of the need for long term treatment and the importance of lifestyle modifications.  AGREE: Multiple dietary modification options and treatment options were discussed and  Dineen agreed to keep a food journal with 1200 calories and 85+ grams of protein daily We discussed the following Behavioral Modification Strategies today: increasing lean protein intake, decrease eating out, dealing with family or coworker sabotage and emotional eating strategies

## 2017-01-12 ENCOUNTER — Encounter (INDEPENDENT_AMBULATORY_CARE_PROVIDER_SITE_OTHER): Payer: Self-pay

## 2017-01-12 ENCOUNTER — Ambulatory Visit (INDEPENDENT_AMBULATORY_CARE_PROVIDER_SITE_OTHER): Payer: Managed Care, Other (non HMO) | Admitting: Family Medicine

## 2017-01-20 ENCOUNTER — Other Ambulatory Visit: Payer: Self-pay | Admitting: Family Medicine

## 2017-01-22 ENCOUNTER — Encounter: Payer: Self-pay | Admitting: Family Medicine

## 2017-01-24 ENCOUNTER — Other Ambulatory Visit: Payer: Self-pay | Admitting: Family Medicine

## 2017-01-24 MED ORDER — CHLORTHALIDONE 25 MG PO TABS: 25.0000 mg | ORAL_TABLET | Freq: Every day | ORAL | 1 refills | Status: DC

## 2017-02-24 ENCOUNTER — Other Ambulatory Visit: Payer: Self-pay | Admitting: Family Medicine

## 2017-02-26 ENCOUNTER — Other Ambulatory Visit: Payer: Self-pay | Admitting: Family Medicine

## 2017-02-26 DIAGNOSIS — I10 Essential (primary) hypertension: Secondary | ICD-10-CM

## 2017-04-14 ENCOUNTER — Other Ambulatory Visit (INDEPENDENT_AMBULATORY_CARE_PROVIDER_SITE_OTHER): Payer: Self-pay | Admitting: Family Medicine

## 2017-04-14 DIAGNOSIS — E7849 Other hyperlipidemia: Secondary | ICD-10-CM

## 2017-04-18 NOTE — Telephone Encounter (Signed)
Please advise pt to come in for an appt, med will be refilled then

## 2017-04-26 ENCOUNTER — Other Ambulatory Visit: Payer: Self-pay

## 2017-04-26 DIAGNOSIS — E7849 Other hyperlipidemia: Secondary | ICD-10-CM

## 2017-04-26 MED ORDER — ATORVASTATIN CALCIUM 20 MG PO TABS: 20.0000 mg | ORAL_TABLET | Freq: Every day | ORAL | 0 refills | Status: DC

## 2017-05-15 ENCOUNTER — Ambulatory Visit: Payer: Managed Care, Other (non HMO) | Admitting: Family Medicine

## 2017-05-16 ENCOUNTER — Ambulatory Visit (HOSPITAL_BASED_OUTPATIENT_CLINIC_OR_DEPARTMENT_OTHER): Payer: Managed Care, Other (non HMO)

## 2017-05-30 ENCOUNTER — Other Ambulatory Visit: Payer: Self-pay | Admitting: Family Medicine

## 2017-06-12 ENCOUNTER — Other Ambulatory Visit (HOSPITAL_BASED_OUTPATIENT_CLINIC_OR_DEPARTMENT_OTHER): Payer: Managed Care, Other (non HMO)

## 2017-06-23 ENCOUNTER — Telehealth: Payer: Managed Care, Other (non HMO) | Admitting: Family Medicine

## 2017-06-23 DIAGNOSIS — L259 Unspecified contact dermatitis, unspecified cause: Secondary | ICD-10-CM

## 2017-06-23 MED ORDER — TRIAMCINOLONE ACETONIDE 0.1 % EX CREA: 1.0000 "application " | TOPICAL_CREAM | Freq: Two times a day (BID) | CUTANEOUS | 0 refills | Status: DC

## 2017-06-23 MED ORDER — HYDROXYZINE HCL 25 MG PO TABS: 25.0000 mg | ORAL_TABLET | Freq: Three times a day (TID) | ORAL | 0 refills | Status: AC | PRN

## 2017-06-23 NOTE — Progress Notes (Signed)
We are sorry that you are not feeling well. Here is how we plan to help!  Based on what you shared with me it looks like you have contact dermatitis.  Contact dermatitis is a skin rash caused by something that touches the skin and causes irritation or inflammation.  Your skin may be red, swollen, dry, cracked, and itch.  The rash should go away in a few days but can last a few weeks.  If you get a rash, it's important to figure out what caused it so the irritant can be avoided in the future.  This is hard to diagnose based on the picture you provided I will give you something topical (steroid BID x up to 14 days- apply a very thin layer) and an oral antihistamine for itching- if your are not achieving any relief in the next 48 hours I really suggest a face to face encounter for evaluation.  1. Contact dermatitis, unspecified contact dermatitis type, unspecified trigger - triamcinolone cream (KENALOG) 0.1 %; Apply 1 application topically 2 (two) times daily. - hydrOXYzine (ATARAX/VISTARIL) 25 MG tablet; Take 1 tablet (25 mg total) by mouth every 8 (eight) hours as needed for up to 5 days for itching.   HOME CARE:   Take cool showers and avoid direct sunlight.  Apply cool compress or wet dressings.  Take a bath in an oatmeal bath.  Sprinkle content of one Aveeno packet under running faucet with comfortably warm water.  Bathe for 15-20 minutes, 1-2 times daily.  Pat dry with a towel. Do not rub the rash.  Use hydrocortisone cream.  Take an antihistamine like Benadryl for widespread rashes that itch.  The adult dose of Benadryl is 25-50 mg by mouth 4 times daily.  Caution:  This type of medication may cause sleepiness.  Do not drink alcohol, drive, or operate dangerous machinery while taking antihistamines.  Do not take these medications if you have prostate enlargement.  Read package instructions thoroughly on all medications that you take.  GET HELP RIGHT AWAY IF:   Symptoms don't go away  after treatment.  Severe itching that persists.  If you rash spreads or swells.  If you rash begins to smell.  If it blisters and opens or develops a yellow-brown crust.  You develop a fever.  You have a sore throat.  You become short of breath.  MAKE SURE YOU:  Understand these instructions. Will watch your condition. Will get help right away if you are not doing well or get worse.  Thank you for choosing an e-visit. Your e-visit answers were reviewed by a board certified advanced clinical practitioner to complete your personal care plan. Depending upon the condition, your plan could have included both over the counter or prescription medications. Please review your pharmacy choice. Be sure that the pharmacy you have chosen is open so that you can pick up your prescription now.  If there is a problem you may message your provider in [place] to have the prescription routed to another pharmacy. Your safety is important to [place]. If you have drug allergies check your prescription carefully.  For the next 24 hours, you can use MyChart to ask questions about today's visit, request a non-urgent call back, or ask for a work or school excuse from your e-visit provider. You will get an email in the next two days asking about your experience. I hope that your e-visit has been valuable and will speed your recovery.

## 2017-06-30 ENCOUNTER — Other Ambulatory Visit: Payer: Self-pay | Admitting: Family Medicine

## 2017-07-22 ENCOUNTER — Other Ambulatory Visit: Payer: Self-pay | Admitting: Family Medicine

## 2017-07-22 DIAGNOSIS — E7849 Other hyperlipidemia: Secondary | ICD-10-CM

## 2017-08-11 ENCOUNTER — Telehealth: Payer: Self-pay | Admitting: Internal Medicine

## 2017-08-11 NOTE — Telephone Encounter (Signed)
Appointment scheduled with Dr Ronnald Ramp on Monday at 11:00am.  Called patient and left message to inform of appointment time. If this does not work for her, please transfer to office to reschedule.

## 2017-08-11 NOTE — Telephone Encounter (Signed)
Dr Ronnald Ramp, can you confirm that it is okay for this patient to establish care with you?

## 2017-08-11 NOTE — Telephone Encounter (Signed)
Copied from [place] [phone]. Topic: General - Other >> Aug 11, 2017 12:11 PM Neva Seat wrote: Pt said she spoke w/ Dr. Ronnald Ramp on the phone and he told her to go ahead and make a new pt appt for Mon. 08-14-17.\ Once ok w/ Dr. Ronnald Ramp please call pt to schedule New Pt appt.

## 2017-08-11 NOTE — Telephone Encounter (Signed)
Yes, you can work her in on Monday

## 2017-08-14 ENCOUNTER — Encounter: Payer: Self-pay | Admitting: Internal Medicine

## 2017-08-14 ENCOUNTER — Ambulatory Visit: Payer: Managed Care, Other (non HMO) | Admitting: Internal Medicine

## 2017-08-14 VITALS — BP 130/80 | HR 65 | Temp 98.1°F | Resp 16 | Ht 64.0 in | Wt 192.0 lb

## 2017-08-14 DIAGNOSIS — H60311 Diffuse otitis externa, right ear: Secondary | ICD-10-CM

## 2017-08-14 DIAGNOSIS — M26621 Arthralgia of right temporomandibular joint: Secondary | ICD-10-CM

## 2017-08-14 DIAGNOSIS — H6504 Acute serous otitis media, recurrent, right ear: Secondary | ICD-10-CM | POA: Insufficient documentation

## 2017-08-14 DIAGNOSIS — H9201 Otalgia, right ear: Secondary | ICD-10-CM

## 2017-08-14 MED ORDER — FLUCONAZOLE 200 MG PO TABS: 200.0000 mg | ORAL_TABLET | Freq: Every day | ORAL | 0 refills | Status: AC

## 2017-08-14 MED ORDER — AMOXICILLIN-POT CLAVULANATE 875-125 MG PO TABS: 1.0000 | ORAL_TABLET | Freq: Two times a day (BID) | ORAL | 0 refills | Status: AC

## 2017-08-14 NOTE — Patient Instructions (Signed)

## 2017-08-14 NOTE — Progress Notes (Signed)
Subjective:  Patient ID: Cheryl Taylor, female    DOB: 11/09/1965  Age: 52 y.o. MRN: [id number]  CC: Otalgia  NEW TO ME  HPI Cheryl Taylor presents for a 1 month hx of right ear pain.  About 2 weeks ago the symptoms worsened so she was seen at an urgent care center and was diagnosed with cerumen impaction and otitis externa.  She tells me the earwax was removed and she has been using a fluoroquinolone eardrop.  She has not noticed any improvement in her symptoms.  Outpatient Medications Prior to Visit  Medication Sig Dispense Refill  . aspirin EC 81 MG tablet Take 1 tablet (81 mg total) by mouth daily.    Marland Kitchen atorvastatin (LIPITOR) 20 MG tablet TAKE 1 TABLET DAILY 90 tablet 0  . chlorthalidone (HYGROTON) 25 MG tablet TAKE 1 TABLET DAILY 90 tablet 1  . escitalopram (LEXAPRO) 20 MG tablet TAKE 1 TABLET DAILY 90 tablet 0  . Krill Oil 350 MG CAPS Take 350 mg by mouth every morning.    . Melatonin 10 MG TABS Take 10 mg by mouth at bedtime.    . metoprolol succinate (TOPROL-XL) 50 MG 24 hr tablet TAKE 1 TABLET DAILY (TAKE WITH OR IMMEDIATELY FOLLOWING A MEAL) 90 tablet 2  . Naproxen-Esomeprazole (VIMOVO) 500-20 MG TBEC Take 500 mg by mouth 2 (two) times daily.    . polyethylene glycol powder (GLYCOLAX/MIRALAX) powder Take 17 g by mouth daily. 3350 g 0  . Tryptophan 500 MG TABS Take 500 mg by mouth 2 times daily at 12 noon and 4 pm.    . valACYclovir (VALTREX) 1000 MG tablet Take 1 tablet (1,000 mg total) by mouth daily. 90 tablet 1  . buPROPion (WELLBUTRIN SR) 200 MG 12 hr tablet Take 1 tablet (200 mg total) by mouth daily at 12 noon. (Patient not taking: Reported on 08/14/2017) 30 tablet 0  . metFORMIN (GLUCOPHAGE) 500 MG tablet Take 1 tablet (500 mg total) by mouth 2 (two) times daily with a meal. (Patient not taking: Reported on 08/14/2017) 60 tablet 0  . nitroGLYCERIN (NITROSTAT) 0.4 MG SL tablet Place 1 tablet (0.4 mg total) under the tongue every 5 (five) minutes as needed for chest pain.  (Patient not taking: Reported on 08/14/2017) 50 tablet 3  . ranitidine (ZANTAC) 300 MG tablet TAKE 1 TABLET AT BEDTIME (Patient not taking: Reported on 08/14/2017) 90 tablet 1  . triamcinolone cream (KENALOG) 0.1 % Apply 1 application topically 2 (two) times daily. (Patient not taking: Reported on 08/14/2017) 30 g 0  . Vitamin D-Vitamin K (D3 + K2 DOTS PO) Take 42 mg by mouth 2 (two) times daily.     No facility-administered medications prior to visit.     ROS Review of Systems  Constitutional: Negative for chills, fatigue and fever.  HENT: Positive for ear pain. Negative for congestion, ear discharge, hearing loss, sinus pressure, sore throat and trouble swallowing.   Eyes: Negative.  Negative for visual disturbance.  Respiratory: Negative.   Cardiovascular: Negative for chest pain, palpitations and leg swelling.  Gastrointestinal: Negative for abdominal pain, diarrhea, nausea and vomiting.  Endocrine: Negative.   Genitourinary: Negative.   Musculoskeletal: Negative.   Skin: Negative.  Negative for rash.  Neurological: Negative.  Negative for dizziness, weakness, light-headedness and headaches.  Hematological: Negative for adenopathy. Does not bruise/bleed easily.  Psychiatric/Behavioral: Negative.     Objective:  BP 130/80 (BP Location: Left Arm, Patient Position: Sitting, Cuff Size: Large)   Pulse 65  Temp 98.1 F (36.7 C) (Oral)   Resp 16   Ht 5\' 4"  (1.626 m)   Wt 192 lb (87.1 kg)   SpO2 98%   BMI 32.96 kg/m   BP Readings from Last 3 Encounters:  08/14/17 130/80  12/29/16 120/76  12/15/16 117/62    Wt Readings from Last 3 Encounters:  08/14/17 192 lb (87.1 kg)  12/29/16 182 lb (82.6 kg)  12/15/16 179 lb (81.2 kg)    Physical Exam  Constitutional: She is oriented to person, place, and time. No distress.  HENT:  Right Ear: There is swelling. No drainage or tenderness. No foreign bodies. No mastoid tenderness. Tympanic membrane is injected, erythematous and  retracted. Tympanic membrane is not scarred, not perforated and not bulging. Tympanic membrane mobility is normal. A middle ear effusion is present. No hemotympanum. No decreased hearing is noted.  Left Ear: Hearing, tympanic membrane, external ear and ear canal normal.  Mouth/Throat: Oropharynx is clear and moist. No oropharyngeal exudate.  There is a scant amount of exudate coating the external auditory canal on the right side.  There is also a smooth elevation in the floor of the right EAC.  There is no cerumen impaction.  Eyes: Conjunctivae are normal. No scleral icterus.  Neck: Normal range of motion. Neck supple. No JVD present. No thyromegaly present.  Cardiovascular: Normal rate, regular rhythm and normal heart sounds.  No murmur heard. Pulmonary/Chest: Effort normal and breath sounds normal. She has no wheezes. She has no rales.  Abdominal: Soft. Bowel sounds are normal. She exhibits no mass. There is no tenderness.  Musculoskeletal: Normal range of motion. She exhibits no edema, tenderness or deformity.  Lymphadenopathy:    She has no cervical adenopathy.  Neurological: She is alert and oriented to person, place, and time.  Skin: Skin is warm and dry. She is not diaphoretic. No pallor.  Psychiatric: She has a normal mood and affect. Her behavior is normal. Judgment and thought content normal.  Vitals reviewed.   Lab Results  Component Value Date   WBC 10.1 04/12/2016   HGB 13.0 04/12/2016   HCT 39.1 04/12/2016   PLT 252.0 04/12/2016   GLUCOSE 88 08/29/2016   CHOL 190 08/29/2016   TRIG 102 08/29/2016   HDL 47 08/29/2016   LDLDIRECT 130.0 04/12/2016   LDLCALC 123 (H) 08/29/2016   ALT 22 08/29/2016   AST 21 08/29/2016   NA 139 08/29/2016   K 3.7 08/29/2016   CL 98 08/29/2016   CREATININE 0.71 08/29/2016   BUN 20 08/29/2016   CO2 24 08/29/2016   TSH 1.15 04/12/2016   HGBA1C 5.5 08/29/2016    No results found.  Assessment & Plan:   Cheryl Taylor was seen today for  otalgia.  Diagnoses and all orders for this visit:  Recurrent acute serous otitis media of right ear-I will treat this infection with Augmentin. -     amoxicillin-clavulanate (AUGMENTIN) 875-125 MG tablet; Take 1 tablet by mouth 2 (two) times daily for 10 days.  Acute diffuse otitis externa of right ear-there may be a component of fungal otitis externa so I have asked her to take a 5-day course of fluconazole. -     fluconazole (DIFLUCAN) 200 MG tablet; Take 1 tablet (200 mg total) by mouth daily for 5 days.  Earache, referred, right- she has a one-month history of right-sided earache with some abnormalities in the middle ear, the TM, as well as the EAC.  I have asked her to undergo an MRI  to see if there is a mass lesion, abscess, tumor, or obstruction of the eustachian tube.   I am having Cheryl Taylor start on amoxicillin-clavulanate and fluconazole. I am also having her maintain her Naproxen-Esomeprazole, Tryptophan, Melatonin, Vitamin D-Vitamin K (D3 + K2 DOTS PO), Krill Oil, nitroGLYCERIN, aspirin EC, polyethylene glycol powder, ranitidine, buPROPion, metFORMIN, valACYclovir, metoprolol succinate, escitalopram, triamcinolone cream, chlorthalidone, and atorvastatin.  Meds ordered this encounter  Medications  . amoxicillin-clavulanate (AUGMENTIN) 875-125 MG tablet    Sig: Take 1 tablet by mouth 2 (two) times daily for 10 days.    Dispense:  20 tablet    Refill:  0  . fluconazole (DIFLUCAN) 200 MG tablet    Sig: Take 1 tablet (200 mg total) by mouth daily for 5 days.    Dispense:  5 tablet    Refill:  0     Follow-up: Return in about 3 weeks (around 09/04/2017).  Cheryl Calico, MD

## 2017-08-20 ENCOUNTER — Other Ambulatory Visit: Payer: Self-pay

## 2017-08-25 ENCOUNTER — Telehealth: Payer: Self-pay

## 2017-08-25 NOTE — Telephone Encounter (Signed)
Copied from [place] [id number]. Topic: Referral - Question >> Aug 25, 2017 10:39 AM Lennox Solders wrote: Reason for CRM: rhonda mri tech from [facility] is calling and would like to know if md could add  MRI with contrast  due to ruling out lesion or mass, tumor per IAC protocol after speaking with dr Lawrence Santiago . Pt is schedule for MRI on 08/27/17 >> Aug 25, 2017 11:32 AM Lennox Solders wrote: Raye Sorrow is chief radiologist

## 2017-08-25 NOTE — Telephone Encounter (Signed)
Re entered to reflect [facility] contract per radiologist request.

## 2017-08-25 NOTE — Addendum Note (Signed)
Addended by: Aviva Signs M on: 08/25/2017 11:46 AM   Modules accepted: Orders

## 2017-08-27 ENCOUNTER — Inpatient Hospital Stay: Admission: RE | Admit: 2017-08-27 | Payer: Self-pay | Source: Ambulatory Visit

## 2017-08-28 ENCOUNTER — Other Ambulatory Visit: Payer: Self-pay | Admitting: Family Medicine

## 2017-08-28 NOTE — Telephone Encounter (Signed)
Please advise 

## 2017-09-02 ENCOUNTER — Other Ambulatory Visit: Payer: Self-pay | Admitting: Family Medicine

## 2017-09-10 ENCOUNTER — Other Ambulatory Visit: Payer: Self-pay | Admitting: Internal Medicine

## 2017-09-10 ENCOUNTER — Encounter: Payer: Self-pay | Admitting: Internal Medicine

## 2017-09-10 ENCOUNTER — Ambulatory Visit
Admission: RE | Admit: 2017-09-10 | Discharge: 2017-09-10 | Disposition: A | Discharge status: Home / Self Care | Payer: Managed Care, Other (non HMO) | Source: Ambulatory Visit | Attending: Internal Medicine | Admitting: Internal Medicine

## 2017-09-10 DIAGNOSIS — H9201 Otalgia, right ear: Secondary | ICD-10-CM

## 2017-09-10 DIAGNOSIS — H6504 Acute serous otitis media, recurrent, right ear: Secondary | ICD-10-CM

## 2017-09-10 DIAGNOSIS — M26621 Arthralgia of right temporomandibular joint: Secondary | ICD-10-CM

## 2017-09-30 ENCOUNTER — Encounter (INDEPENDENT_AMBULATORY_CARE_PROVIDER_SITE_OTHER): Payer: Self-pay | Admitting: Physician Assistant

## 2017-10-22 ENCOUNTER — Other Ambulatory Visit: Payer: Self-pay | Admitting: Family Medicine

## 2017-10-22 DIAGNOSIS — E7849 Other hyperlipidemia: Secondary | ICD-10-CM

## 2017-11-04 ENCOUNTER — Ambulatory Visit: Payer: Managed Care, Other (non HMO) | Admitting: Family Medicine

## 2017-11-04 ENCOUNTER — Encounter: Payer: Self-pay | Admitting: Family Medicine

## 2017-11-04 VITALS — BP 110/72 | HR 73 | Temp 98.4°F | Wt 197.1 lb

## 2017-11-04 DIAGNOSIS — M542 Cervicalgia: Secondary | ICD-10-CM

## 2017-11-04 MED ORDER — MELOXICAM 15 MG PO TABS: 15.0000 mg | ORAL_TABLET | Freq: Every day | ORAL | 0 refills | Status: DC

## 2017-11-04 NOTE — Patient Instructions (Signed)
We are going to refer you to orthopedics for your neck Please get a copy of your neck films from your chiropractor to take with you In the meantime let's try mobic once a day for about 2 weeks, then as needed  Let us know if any worsening in the meantime or if any other concerns

## 2017-11-04 NOTE — Progress Notes (Signed)
[place] [facility] [address] [place], [place] [address] [phone]  Date:  11/04/2017   Name:  Cheryl Taylor   DOB:  06/13/1965   MRN:  [id number]  PCP:  Janith Lima, MD    Chief Complaint: Neck Pain (pain in neck since august--has done yoga, massage, chiro, stretches and this has not helped.  pain worse in the last couple of days. )   History of Present Illness:  Cheryl Taylor is a 52 y.o. very pleasant female patient who presents with the following: Pt of D. 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- she thinks that traveling may be contributing to her neck isues She has noted worsening neck pain for about 6 weeks now She has tried yoga, massage, etc She did go to an UC while out of town last week and was given an rx for tizantidine This makes her sleepy but does not otherwise really help all that much  She has not had any neck issues in the more distant past, no injury, no numbness/ weakness/ pain in her arms  She also tried some OTC meds- aleve- but they do not seem to help   She did go and see a chiropractor and had some x-rays, saw them for about 8 sessions but did not seem to be getting better so she stopped going.   The pain is located at the base of her head, in the muscles of her neck on both sides. Perhaps worse on the left She is otherwise feeling fine   Patient Active Problem List   Diagnosis Date Noted  . Recurrent acute serous otitis media of right ear 08/14/2017  . Acute diffuse otitis externa of right ear 08/14/2017  . Earache, referred, right 08/14/2017  . Bilateral carotid bruits 10/18/2016  . Mixed dyslipidemia 10/18/2016  . Insulin resistance 05/19/2016  . Class 1 obesity without serious comorbidity with body mass index (BMI) of 32.0 to 32.9 in adult 05/19/2016  . Hypersomnia 04/23/2015  . OSA (obstructive sleep  apnea) 02/10/2015  . Screening for cervical cancer 02/06/2015  . Morbid obesity ([place]) 08/10/2014  . Perimenopausal 11/18/2013  . Ovarian cyst, left 11/07/2012  . Hepatic hemangioma 11/07/2012  . Preventative health care 12/15/2011  . Raynaud's disease 12/15/2011  . Hyperlipidemia 12/15/2011  . 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 Insomnia 11/14/2010  . Essential hypertension 06/07/2010  . Depression 04/20/2010  . Vitamin D deficiency 04/20/2010    Past Medical History:  Diagnosis Date  . Alcohol abuse, in remission   . Anger   . Anxiety   . Back pain   . Chest pain   . Depression   . Diabetes mellitus without complication ([place])   . Drug use   . Fatigue 02/15/2015  . GERD (gastroesophageal reflux disease)   . HTN (hypertension)   . Hyperlipidemia   . Obesity 08/10/2014  . Obesity   . Pain in both feet    arthritis / [place] Rigidus  . Perimenopausal 11/18/2013  . Raynaud's disease   . Screening for cervical cancer 02/06/2015  . Sleep apnea   . SOB (shortness of breath)   . Vitamin D deficiency     Past Surgical History:  Procedure Laterality Date  . CESAREAN SECTION  2003  . TONSILLECTOMY  1980  Social History   Tobacco Use  . Smoking status: Never Smoker  . Smokeless tobacco: Never Used  Substance Use Topics  . Alcohol use: No    Alcohol/week: 50.0 standard drinks    Types: 50 Glasses of wine per week    Comment: quit ETOH on 2013  . Drug use: No    Types: Amphetamines    Comment: Ativan 4-5mg  daily, no longer taking    Family History  Problem Relation Age of Onset  . Kidney failure Father 55       deceased  . Kidney disease Father   . Obesity Father   . Seizures Father   . Heart disease Father 6  . Hypertension Father   . Hyperlipidemia Father   . Stroke Father   . Sleep apnea Father   . Schizophrenia Brother        55yo  . Alcohol abuse Brother   . Other Brother        chronic pain, 40 yo  . Stroke Brother   .  Alcohol abuse Sister        17yo  . Obesity Paternal Grandfather   . Stroke Sister   . Depression Mother   . Anxiety disorder Mother   . Obesity Mother     No Known Allergies  Medication list has been reviewed and updated.  Current Outpatient Medications on File Prior to Visit  Medication Sig Dispense Refill  . aspirin EC 81 MG tablet Take 1 tablet (81 mg total) by mouth daily.    Marland Kitchen atorvastatin (LIPITOR) 20 MG tablet TAKE 1 TABLET DAILY 90 tablet 0  . buPROPion (WELLBUTRIN SR) 200 MG 12 hr tablet Take 1 tablet (200 mg total) by mouth daily at 12 noon. 30 tablet 0  . chlorthalidone (HYGROTON) 25 MG tablet TAKE 1 TABLET DAILY 90 tablet 1  . escitalopram (LEXAPRO) 20 MG tablet TAKE 1 TABLET DAILY 90 tablet 1  . Krill Oil 350 MG CAPS Take 350 mg by mouth every morning.    . Melatonin 10 MG TABS Take 10 mg by mouth at bedtime.    . metFORMIN (GLUCOPHAGE) 500 MG tablet Take 1 tablet (500 mg total) by mouth 2 (two) times daily with a meal. 60 tablet 0  . metoprolol succinate (TOPROL-XL) 50 MG 24 hr tablet TAKE 1 TABLET DAILY (TAKE WITH OR IMMEDIATELY FOLLOWING A MEAL) 90 tablet 2  . Naproxen-Esomeprazole (VIMOVO) 500-20 MG TBEC Take 500 mg by mouth 2 (two) times daily.    . nitroGLYCERIN (NITROSTAT) 0.4 MG SL tablet Place 1 tablet (0.4 mg total) under the tongue every 5 (five) minutes as needed for chest pain. 50 tablet 3  . polyethylene glycol powder (GLYCOLAX/MIRALAX) powder Take 17 g by mouth daily. 3350 g 0  . ranitidine (ZANTAC) 300 MG tablet TAKE 1 TABLET AT BEDTIME 90 tablet 1  . triamcinolone cream (KENALOG) 0.1 % Apply 1 application topically 2 (two) times daily. 30 g 0  . Tryptophan 500 MG TABS Take 500 mg by mouth 2 times daily at 12 noon and 4 pm.    . valACYclovir (VALTREX) 1000 MG tablet TAKE 1 TABLET DAILY 90 tablet 1  . Vitamin D-Vitamin K (D3 + K2 DOTS PO) Take 42 mg by mouth 2 (two) times daily.     No current facility-administered medications on file prior to visit.      Review of Systems:  As per HPI- otherwise negative. No fever or chills No CP or SOB  Physical Examination: Vitals:   11/04/17 0917  BP: 110/72  Pulse: 73  Temp: 98.4 F (36.9 C)  SpO2: 96%   Vitals:   11/04/17 0917  Weight: 197 lb 2 oz (89.4 kg)   Body mass index is 33.84 kg/m. Ideal Body Weight:    GEN: WDWN, NAD, Non-toxic, A & O x 3, looks well, obese  HEENT: Atraumatic, Normocephalic. Neck supple. No masses, No LAD.  Bilateral TM wnl, oropharynx normal.  PEERL,EOMI.   Ears and Nose: No external deformity. CV: RRR, No M/G/R. No JVD. No thrill. No extra heart sounds. PULM: CTA B, no wheezes, crackles, rhonchi. No retractions. No resp. distress. No accessory muscle use. EXTR: No c/c/e NEURO Normal gait.  PSYCH: Normally interactive. Conversant. Not depressed or anxious appearing.  Calm demeanor.  She notes tenderness to palpation of the muscles about the cervical spine. ROM is intact but slightly limited in rotation to the left/ normal BUE strength, sensation and DTR  Assessment and Plan: Neck pain - Plan: meloxicam (MOBIC) 15 MG tablet, Ambulatory referral to Orthopedic Surgery  Here today with neck pain- present for 6-8 weeks She has seen a chiropractor and did about 8 sessions, had x-rays Also went to an UC and got zanaflex I do not have access to her chiropractic records at this time She would like to consult with ortho which is reasonable Placed referral and also rx for mobic to use prn for pain Cautioned not to use with other NSAIDs  Signed Lamar Blinks, MD

## 2017-11-24 ENCOUNTER — Other Ambulatory Visit: Payer: Self-pay | Admitting: Family Medicine

## 2017-11-24 DIAGNOSIS — I10 Essential (primary) hypertension: Secondary | ICD-10-CM

## 2017-11-24 NOTE — Telephone Encounter (Signed)
Please advise 

## 2017-11-25 DIAGNOSIS — S161XXA Strain of muscle, fascia and tendon at neck level, initial encounter: Secondary | ICD-10-CM | POA: Insufficient documentation

## 2017-11-25 DIAGNOSIS — G8929 Other chronic pain: Secondary | ICD-10-CM | POA: Insufficient documentation

## 2017-12-02 ENCOUNTER — Other Ambulatory Visit: Payer: Self-pay | Admitting: Family Medicine

## 2017-12-02 DIAGNOSIS — M542 Cervicalgia: Secondary | ICD-10-CM

## 2017-12-05 ENCOUNTER — Encounter: Payer: Self-pay | Admitting: Internal Medicine

## 2017-12-05 ENCOUNTER — Other Ambulatory Visit: Payer: Self-pay | Admitting: Internal Medicine

## 2017-12-05 DIAGNOSIS — M542 Cervicalgia: Secondary | ICD-10-CM

## 2017-12-05 MED ORDER — MELOXICAM 15 MG PO TABS: 15.0000 mg | ORAL_TABLET | Freq: Every day | ORAL | 1 refills | Status: DC

## 2017-12-15 ENCOUNTER — Encounter (INDEPENDENT_AMBULATORY_CARE_PROVIDER_SITE_OTHER): Payer: Self-pay | Admitting: Family Medicine

## 2017-12-21 ENCOUNTER — Encounter: Payer: Self-pay | Admitting: Internal Medicine

## 2017-12-21 ENCOUNTER — Ambulatory Visit: Payer: Managed Care, Other (non HMO) | Admitting: Internal Medicine

## 2017-12-21 VITALS — BP 120/72 | HR 73 | Temp 98.2°F | Resp 16 | Ht 64.0 in | Wt 191.8 lb

## 2017-12-21 DIAGNOSIS — H60311 Diffuse otitis externa, right ear: Secondary | ICD-10-CM | POA: Diagnosis not present

## 2017-12-21 MED ORDER — CIPROFLOXACIN-DEXAMETHASONE 0.3-0.1 % OT SUSP: 4.0000 [drp] | Freq: Two times a day (BID) | OTIC | 2 refills | Status: DC

## 2017-12-21 NOTE — Patient Instructions (Signed)

## 2017-12-21 NOTE — Progress Notes (Signed)
Subjective:  Patient ID: Lolita Patella, female    DOB: [birth year]  Age: 52 y.o. MRN: [id number]  CC: Otalgia   HPI Siren Porrata presents for a several week hx of right-sided decreased level of hearing and right ear discomfort.  Outpatient Medications Prior to Visit  Medication Sig Dispense Refill  . aspirin EC 81 MG tablet Take 1 tablet (81 mg total) by mouth daily.    Marland Kitchen atorvastatin (LIPITOR) 20 MG tablet TAKE 1 TABLET DAILY 90 tablet 0  . chlorthalidone (HYGROTON) 25 MG tablet TAKE 1 TABLET DAILY 90 tablet 1  . escitalopram (LEXAPRO) 20 MG tablet TAKE 1 TABLET DAILY 90 tablet 1  . Krill Oil 350 MG CAPS Take 350 mg by mouth every morning.    . Melatonin 10 MG TABS Take 10 mg by mouth at bedtime.    . meloxicam (MOBIC) 15 MG tablet Take 1 tablet (15 mg total) by mouth daily. Use daily for 2 weeks then as needed 90 tablet 1  . metoprolol succinate (TOPROL-XL) 50 MG 24 hr tablet TAKE 1 TABLET DAILY (TAKE WITH OR IMMEDIATELY FOLLOWING A MEAL) 90 tablet 1  . polyethylene glycol powder (GLYCOLAX/MIRALAX) powder Take 17 g by mouth daily. 3350 g 0  . Tryptophan 500 MG TABS Take 500 mg by mouth 2 times daily at 12 noon and 4 pm.    . valACYclovir (VALTREX) 1000 MG tablet TAKE 1 TABLET DAILY 90 tablet 1  . buPROPion (WELLBUTRIN SR) 200 MG 12 hr tablet Take 1 tablet (200 mg total) by mouth daily at 12 noon. 30 tablet 0  . metFORMIN (GLUCOPHAGE) 500 MG tablet Take 1 tablet (500 mg total) by mouth 2 (two) times daily with a meal. 60 tablet 0  . nitroGLYCERIN (NITROSTAT) 0.4 MG SL tablet Place 1 tablet (0.4 mg total) under the tongue every 5 (five) minutes as needed for chest pain. 50 tablet 3  . ranitidine (ZANTAC) 300 MG tablet TAKE 1 TABLET AT BEDTIME 90 tablet 1  . triamcinolone cream (KENALOG) 0.1 % Apply 1 application topically 2 (two) times daily. 30 g 0  . Vitamin D-Vitamin K (D3 + K2 DOTS PO) Take 42 mg by mouth 2 (two) times daily.     No facility-administered medications prior to  visit.     ROS Review of Systems  Constitutional: Negative for chills, fatigue and fever.  HENT: Positive for ear pain and hearing loss. Negative for ear discharge, facial swelling, sinus pressure, sore throat and trouble swallowing.   Respiratory: Negative.  Negative for cough, chest tightness, shortness of breath and wheezing.   Cardiovascular: Negative for chest pain, palpitations and leg swelling.  Gastrointestinal: Negative for abdominal pain, constipation, diarrhea, nausea and vomiting.  Endocrine: Negative.   Genitourinary: Negative for difficulty urinating.  Musculoskeletal: Negative.  Negative for arthralgias and myalgias.  Skin: Negative.  Negative for color change and rash.  Hematological: Negative for adenopathy. Does not bruise/bleed easily.  Psychiatric/Behavioral: Negative.     Objective:  BP 120/72 (BP Location: Left Arm, Patient Position: Sitting, Cuff Size: Normal)   Pulse 73   Temp 98.2 F (36.8 C) (Oral)   Resp 16   Ht 5\' 4"  (1.626 m)   Wt 191 lb 12 oz (87 kg)   SpO2 97%   BMI 32.91 kg/m   BP Readings from Last 3 Encounters:  12/21/17 120/72  11/04/17 110/72  08/14/17 130/80    Wt Readings from Last 3 Encounters:  12/21/17 191 lb 12 oz (87 kg)  11/04/17 197 lb 2 oz (89.4 kg)  08/14/17 192 lb (87.1 kg)    Physical Exam  Constitutional: She is oriented to person, place, and time. No distress.  HENT:  Right Ear: Tympanic membrane normal. No drainage, swelling or tenderness. A foreign body (cerumen impaction) is present. No mastoid tenderness. Tympanic membrane is not injected, not scarred, not perforated, not erythematous, not retracted and not bulging. No middle ear effusion. No hemotympanum. Decreased hearing is noted.  Left Ear: Hearing, tympanic membrane, external ear and ear canal normal.  Mouth/Throat: Oropharynx is clear and moist. No oropharyngeal exudate.  Right ear cerumen impaction- The ear was soaked in [place] and then I used an ear pick to  remove the cerumen.  She tolerated this well.  Afterwards it is evident that there is a small erosion, erythema, and mild swelling on the superior medial aspect of the EAC.  Eyes: Conjunctivae are normal. No scleral icterus.  Neck: Normal range of motion. Neck supple. No JVD present. No thyromegaly present.  Cardiovascular: Normal rate, regular rhythm and normal heart sounds. Exam reveals no gallop.  No murmur heard. Pulmonary/Chest: Effort normal and breath sounds normal. No stridor. She has no rales.  Abdominal: Soft. Bowel sounds are normal. She exhibits no mass. There is no tenderness.  Musculoskeletal: Normal range of motion. She exhibits no edema, tenderness or deformity.  Lymphadenopathy:    She has no cervical adenopathy.  Neurological: She is alert and oriented to person, place, and time.  Skin: Skin is warm and dry. She is not diaphoretic. No pallor.  Vitals reviewed.   Lab Results  Component Value Date   WBC 10.1 04/12/2016   HGB 13.0 04/12/2016   HCT 39.1 04/12/2016   PLT 252.0 04/12/2016   GLUCOSE 88 08/29/2016   CHOL 190 08/29/2016   TRIG 102 08/29/2016   HDL 47 08/29/2016   LDLDIRECT 130.0 04/12/2016   LDLCALC 123 (H) 08/29/2016   ALT 22 08/29/2016   AST 21 08/29/2016   NA 139 08/29/2016   K 3.7 08/29/2016   CL 98 08/29/2016   CREATININE 0.71 08/29/2016   BUN 20 08/29/2016   CO2 24 08/29/2016   TSH 1.15 04/12/2016   HGBA1C 5.5 08/29/2016    Mr Brain Wo Contrast  Result Date: 09/10/2017 CLINICAL DATA:  Right ear pain with hearing loss for 2.5 months Recurrent acute serous otitis media right ear. Arthralgia of right temporomandibular joint. EXAM: MRI HEAD WITHOUT CONTRAST TECHNIQUE: Multiplanar, multiecho pulse sequences of the brain and surrounding structures were obtained without intravenous contrast. Creatinine was obtained on site at [place] at [address] Results: Creatinine 0.8 mg/dL. COMPARISON:  None. FINDINGS: Brain: Normal appearance  of the brainstem, cisterns, temporal bones. No infarction, hemorrhage, hydrocephalus, extra-axial collection or mass lesion. Vascular: Major flow voids and vascular enhancements are preserved Skull and upper cervical spine: C3-4 facet spurring on the left. Normal appearance of the temporomandibular joints on sagittal T1 weighted imaging. Normal regional inflammation. Sinuses/Orbits: Negative. The mastoid and middle ear spaces are clear. The nasopharynx is clear. IMPRESSION: 1. Normal noncontrast exam. No mastoid or middle ear opacification currently; the nasopharynx is clear. Unremarkable appearance of the right temporomandibular joint and regional soft tissues. 2. IV access could not be obtained and contrast was not administered. Electronically Signed   By: Monte Fantasia M.D.   On: 09/10/2017 11:06    Assessment & Plan:   Baleigh was seen today for otalgia.  Diagnoses and all orders for this visit:  Chronic diffuse otitis externa of right ear -     ciprofloxacin-dexamethasone (CIPRODEX) OTIC suspension; Place 4 drops into the right ear 2 (two) times daily.   I have discontinued [place] Aver Vitamin D-Vitamin K (D3 + K2 DOTS PO), nitroGLYCERIN, ranitidine, buPROPion, metFORMIN, and triamcinolone cream. I am also having her start on ciprofloxacin-dexamethasone. Additionally, I am having her maintain her Tryptophan, Melatonin, Krill Oil, aspirin EC, polyethylene glycol powder, chlorthalidone, escitalopram, valACYclovir, atorvastatin, metoprolol succinate, and meloxicam.  Meds ordered this encounter  Medications  . ciprofloxacin-dexamethasone (CIPRODEX) OTIC suspension    Sig: Place 4 drops into the right ear 2 (two) times daily.    Dispense:  7.5 mL    Refill:  2     Follow-up: Return if symptoms worsen or fail to improve.  [name], MD

## 2018-01-09 ENCOUNTER — Encounter: Payer: Self-pay | Admitting: Internal Medicine

## 2018-01-17 ENCOUNTER — Other Ambulatory Visit: Payer: Self-pay | Admitting: Family Medicine

## 2018-01-23 ENCOUNTER — Other Ambulatory Visit: Payer: Self-pay | Admitting: Internal Medicine

## 2018-01-23 DIAGNOSIS — E7849 Other hyperlipidemia: Secondary | ICD-10-CM

## 2018-01-27 ENCOUNTER — Encounter: Payer: Self-pay | Admitting: Internal Medicine

## 2018-01-29 ENCOUNTER — Other Ambulatory Visit: Payer: Self-pay | Admitting: Internal Medicine

## 2018-01-29 DIAGNOSIS — E7849 Other hyperlipidemia: Secondary | ICD-10-CM

## 2018-01-29 MED ORDER — ATORVASTATIN CALCIUM 20 MG PO TABS: 20.0000 mg | ORAL_TABLET | Freq: Every day | ORAL | 1 refills | Status: DC

## 2018-02-06 ENCOUNTER — Ambulatory Visit (INDEPENDENT_AMBULATORY_CARE_PROVIDER_SITE_OTHER): Payer: BLUE CROSS/BLUE SHIELD | Admitting: Internal Medicine

## 2018-02-06 ENCOUNTER — Encounter: Payer: Self-pay | Admitting: Internal Medicine

## 2018-02-06 ENCOUNTER — Other Ambulatory Visit (INDEPENDENT_AMBULATORY_CARE_PROVIDER_SITE_OTHER): Payer: BLUE CROSS/BLUE SHIELD

## 2018-02-06 VITALS — BP 124/74 | HR 61 | Temp 97.8°F | Ht 64.0 in | Wt 193.0 lb

## 2018-02-06 DIAGNOSIS — D1803 Hemangioma of intra-abdominal structures: Secondary | ICD-10-CM | POA: Diagnosis not present

## 2018-02-06 DIAGNOSIS — Z0000 Encounter for general adult medical examination without abnormal findings: Secondary | ICD-10-CM | POA: Diagnosis not present

## 2018-02-06 DIAGNOSIS — Z124 Encounter for screening for malignant neoplasm of cervix: Secondary | ICD-10-CM

## 2018-02-06 DIAGNOSIS — E8881 Metabolic syndrome: Secondary | ICD-10-CM

## 2018-02-06 DIAGNOSIS — Z1211 Encounter for screening for malignant neoplasm of colon: Secondary | ICD-10-CM

## 2018-02-06 DIAGNOSIS — I10 Essential (primary) hypertension: Secondary | ICD-10-CM

## 2018-02-06 DIAGNOSIS — Z1239 Encounter for other screening for malignant neoplasm of breast: Secondary | ICD-10-CM

## 2018-02-06 LAB — LIPID PANEL
Cholesterol: 145 mg/dL (ref 0–200)
HDL: 41.7 mg/dL (ref >39.00)
LDL Cholesterol: 78 mg/dL (ref 0–99)
NonHDL: 103.43
Total CHOL/HDL Ratio: 3
Triglycerides: 127 mg/dL (ref 0.0–149.0)
VLDL: 25.4 mg/dL (ref 0.0–40.0)

## 2018-02-06 LAB — CBC WITH DIFFERENTIAL/PLATELET
Basophils Absolute: 0.1 10*3/uL (ref 0.0–0.1)
Basophils Relative: 1.1 % (ref 0.0–3.0)
Eosinophils Absolute: 0.2 10*3/uL (ref 0.0–0.7)
Eosinophils Relative: 2.5 % (ref 0.0–5.0)
HCT: 36.6 % (ref 36.0–46.0)
Hemoglobin: 11.9 g/dL — ABNORMAL LOW (ref 12.0–15.0)
Lymphocytes Relative: 25.4 % (ref 12.0–46.0)
Lymphs Abs: 2.4 10*3/uL (ref 0.7–4.0)
MCHC: 32.6 g/dL (ref 30.0–36.0)
MCV: 79.2 fl (ref 78.0–100.0)
Monocytes Absolute: 0.8 10*3/uL (ref 0.1–1.0)
Monocytes Relative: 7.9 % (ref 3.0–12.0)
Neutro Abs: 6 10*3/uL (ref 1.4–7.7)
Neutrophils Relative %: 63.1 % (ref 43.0–77.0)
Platelets: 276 10*3/uL (ref 150.0–400.0)
RBC: 4.62 Mil/uL (ref 3.87–5.11)
RDW: 14.9 % (ref 11.5–15.5)
WBC: 9.6 10*3/uL (ref 4.0–10.5)

## 2018-02-06 LAB — COMPREHENSIVE METABOLIC PANEL
ALT: 26 U/L (ref 0–35)
AST: 23 U/L (ref 0–37)
Albumin: 4.2 g/dL (ref 3.5–5.2)
Alkaline Phosphatase: 66 U/L (ref 39–117)
BUN: 18 mg/dL (ref 6–23)
CO2: 31 mEq/L (ref 19–32)
Calcium: 9.4 mg/dL (ref 8.4–10.5)
Chloride: 94 mEq/L — ABNORMAL LOW (ref 96–112)
Creatinine, Ser: 0.77 mg/dL (ref 0.40–1.20)
GFR: 82.72 mL/min (ref >60.00)
Glucose, Bld: 83 mg/dL (ref 70–99)
Potassium: 3 mEq/L — ABNORMAL LOW (ref 3.5–5.1)
Sodium: 132 mEq/L — ABNORMAL LOW (ref 135–145)
Total Bilirubin: 0.3 mg/dL (ref 0.2–1.2)
Total Protein: 7.1 g/dL (ref 6.0–8.3)

## 2018-02-06 LAB — URINALYSIS, ROUTINE W REFLEX MICROSCOPIC
Bilirubin Urine: NEGATIVE
Ketones, ur: NEGATIVE
Leukocytes, UA: NEGATIVE
Nitrite: NEGATIVE
Specific Gravity, Urine: <=1.005 — AB (ref 1.000–1.030)
Total Protein, Urine: NEGATIVE
Urine Glucose: NEGATIVE
Urobilinogen, UA: 0.2 (ref 0.0–1.0)
pH: 7 (ref 5.0–8.0)

## 2018-02-06 LAB — TSH: TSH: 1.41 u[IU]/mL (ref 0.35–4.50)

## 2018-02-06 LAB — HEMOGLOBIN A1C: Hgb A1c MFr Bld: 5.8 % (ref 4.6–6.5)

## 2018-02-06 NOTE — Progress Notes (Signed)
Subjective:  Patient ID: Cheryl Taylor, female    DOB: October 19, 1965  Age: 53 y.o. MRN: [id number]  CC: Annual Exam   HPI Ila Landowski presents for a CPX.  She complains of fatigue and lightheadedness.  She is active and denies any episodes of CP, DOE, palpitations, edema, or near syncope.  Outpatient Medications Prior to Visit  Medication Sig Dispense Refill  . atorvastatin (LIPITOR) 20 MG tablet Take 1 tablet (20 mg total) by mouth daily. 90 tablet 1  . escitalopram (LEXAPRO) 20 MG tablet TAKE 1 TABLET DAILY 90 tablet 1  . Melatonin 10 MG TABS Take 10 mg by mouth at bedtime.    . meloxicam (MOBIC) 15 MG tablet Take 1 tablet (15 mg total) by mouth daily. Use daily for 2 weeks then as needed 90 tablet 1  . metoprolol succinate (TOPROL-XL) 50 MG 24 hr tablet TAKE 1 TABLET DAILY (TAKE WITH OR IMMEDIATELY FOLLOWING A MEAL) 90 tablet 1  . valACYclovir (VALTREX) 1000 MG tablet TAKE 1 TABLET DAILY 90 tablet 1  . aspirin EC 81 MG tablet Take 1 tablet (81 mg total) by mouth daily.    . chlorthalidone (HYGROTON) 25 MG tablet TAKE 1 TABLET DAILY 90 tablet 4  . Krill Oil 350 MG CAPS Take 350 mg by mouth every morning.    . Tryptophan 500 MG TABS Take 500 mg by mouth 2 times daily at 12 noon and 4 pm.    . polyethylene glycol powder (GLYCOLAX/MIRALAX) powder Take 17 g by mouth daily. 3350 g 0   No facility-administered medications prior to visit.     ROS Review of Systems  Constitutional: Positive for fatigue. Negative for diaphoresis and unexpected weight change.  HENT: Negative.   Eyes: Negative for visual disturbance.  Respiratory: Negative for apnea, cough, chest tightness, shortness of breath and wheezing.   Cardiovascular: Negative for chest pain, palpitations and leg swelling.  Gastrointestinal: Negative for abdominal pain, constipation, diarrhea, nausea and vomiting.  Genitourinary: Negative.  Negative for decreased urine volume, difficulty urinating, dysuria, frequency, hematuria  and urgency.  Musculoskeletal: Negative.  Negative for arthralgias, back pain, myalgias and neck pain.  Skin: Negative.  Negative for color change, pallor and rash.  Neurological: Positive for light-headedness. Negative for dizziness, weakness and numbness.  Hematological: Negative for adenopathy. Does not bruise/bleed easily.  Psychiatric/Behavioral: Negative.  Negative for dysphoric mood and sleep disturbance. The patient is not nervous/anxious.     Objective:  BP 124/74 (BP Location: Left Arm, Patient Position: Sitting, Cuff Size: Large)   Pulse 61   Temp 97.8 F (36.6 C) (Oral)   Ht 5\' 4"  (1.626 m)   Wt 193 lb (87.5 kg)   SpO2 98%   BMI 33.13 kg/m   BP Readings from Last 3 Encounters:  02/06/18 124/74  12/21/17 120/72  11/04/17 110/72    Wt Readings from Last 3 Encounters:  02/06/18 193 lb (87.5 kg)  12/21/17 191 lb 12 oz (87 kg)  11/04/17 197 lb 2 oz (89.4 kg)    Physical Exam Vitals signs reviewed.  Constitutional:      General: She is not in acute distress.    Appearance: She is obese. She is not diaphoretic.  HENT:     Nose: Nose normal. No congestion.     Mouth/Throat:     Mouth: Mucous membranes are moist.     Pharynx: No posterior oropharyngeal erythema.  Eyes:     General: No scleral icterus.    Conjunctiva/sclera: Conjunctivae normal.  Neck:     Musculoskeletal: Normal range of motion and neck supple. No muscular tenderness.  Cardiovascular:     Rate and Rhythm: Normal rate and regular rhythm.     Heart sounds: No murmur. No gallop.   Pulmonary:     Effort: Pulmonary effort is normal.     Breath sounds: No stridor. No wheezing, rhonchi or rales.  Abdominal:     General: Bowel sounds are normal.     Palpations: There is no hepatomegaly, splenomegaly or mass.     Tenderness: There is no abdominal tenderness. There is no guarding.     Hernia: No hernia is present.  Musculoskeletal: Normal range of motion.        General: No swelling.     Right  lower leg: No edema.     Left lower leg: No edema.  Lymphadenopathy:     Cervical: No cervical adenopathy.  Skin:    General: Skin is warm.     Coloration: Skin is not pale.     Findings: No rash.  Neurological:     General: No focal deficit present.     Mental Status: She is oriented to person, place, and time. Mental status is at baseline.  Psychiatric:        Mood and Affect: Mood normal.        Behavior: Behavior normal.        Thought Content: Thought content normal.        Judgment: Judgment normal.     Lab Results  Component Value Date   WBC 9.6 02/06/2018   HGB 11.9 (L) 02/06/2018   HCT 36.6 02/06/2018   PLT 276.0 02/06/2018   GLUCOSE 83 02/06/2018   CHOL 145 02/06/2018   TRIG 127.0 02/06/2018   HDL 41.70 02/06/2018   LDLDIRECT 130.0 04/12/2016   LDLCALC 78 02/06/2018   ALT 26 02/06/2018   AST 23 02/06/2018   NA 132 (L) 02/06/2018   K 3.0 (L) 02/06/2018   CL 94 (L) 02/06/2018   CREATININE 0.77 02/06/2018   BUN 18 02/06/2018   CO2 31 02/06/2018   TSH 1.41 02/06/2018   HGBA1C 5.8 02/06/2018    Mr Brain Wo Contrast  Result Date: 09/10/2017 CLINICAL DATA:  Right ear pain with hearing loss for 2.5 months Recurrent acute serous otitis media right ear. Arthralgia of right temporomandibular joint. EXAM: MRI HEAD WITHOUT CONTRAST TECHNIQUE: Multiplanar, multiecho pulse sequences of the brain and surrounding structures were obtained without intravenous contrast. Creatinine was obtained on site at [facility] at [address] Results: Creatinine 0.8 mg/dL. COMPARISON:  None. FINDINGS: Brain: Normal appearance of the brainstem, cisterns, temporal bones. No infarction, hemorrhage, hydrocephalus, extra-axial collection or mass lesion. Vascular: Major flow voids and vascular enhancements are preserved Skull and upper cervical spine: C3-4 facet spurring on the left. Normal appearance of the temporomandibular joints on sagittal T1 weighted imaging. Normal regional  inflammation. Sinuses/Orbits: Negative. The mastoid and middle ear spaces are clear. The nasopharynx is clear. IMPRESSION: 1. Normal noncontrast exam. No mastoid or middle ear opacification currently; the nasopharynx is clear. Unremarkable appearance of the right temporomandibular joint and regional soft tissues. 2. IV access could not be obtained and contrast was not administered. Electronically Signed   By: Monte Fantasia M.D.   On: 09/10/2017 11:06    Assessment & Plan:   Tomeeka was seen today for annual exam.  Diagnoses and all orders for this visit:  Essential hypertension- Her blood pressure is well controlled  but she has developed significant electrolyte abnormalities so I have asked her to stop taking chlorthalidone.  I have asked her return in about 4 to 6 weeks for me to recheck her blood pressure and her electrolytes. -     CBC with Differential/Platelet; Future -     TSH; Future -     Urinalysis, Routine w reflex microscopic; Future  Preventative health care- Exam completed, labs reviewed, vaccines reviewed, she prefers to see a female GYN doctor for her Pap smear, mammogram and colon cancer screening are ordered. -     Lipid panel; Future -     Hepatitis C antibody; Future  Hepatic hemangioma- Her LFTs are normal.  There are no complications noted related to this. -     Comprehensive metabolic panel; Future  Colon cancer screening -     Ambulatory referral to Gastroenterology  Breast cancer screening -     MM Digital Screening; Future  Cervical cancer screening -     Ambulatory referral to Obstetrics / Gynecology  Insulin resistance- Her A1c is at 5.8%.  Medical therapy is not indicated.  She was encouraged to improve her lifestyle modifications. -     Hemoglobin A1c; Future   I have discontinued Anderson Malta Kinkade's Tryptophan, Krill Oil, aspirin EC, polyethylene glycol powder, and chlorthalidone. I am also having her maintain her Melatonin, escitalopram, valACYclovir,  metoprolol succinate, meloxicam, and atorvastatin.  No orders of the defined types were placed in this encounter.    Follow-up: Return in about 6 months (around 08/07/2018).  [name], MD

## 2018-02-06 NOTE — Patient Instructions (Signed)

## 2018-02-07 ENCOUNTER — Encounter: Payer: Self-pay | Admitting: Gastroenterology

## 2018-02-07 ENCOUNTER — Encounter: Payer: Self-pay | Admitting: Internal Medicine

## 2018-02-07 LAB — HEPATITIS C ANTIBODY
Hepatitis C Ab: NONREACTIVE
SIGNAL TO CUT-OFF: 0.18 (ref <1.00)

## 2018-02-19 ENCOUNTER — Encounter: Payer: Self-pay | Admitting: Gastroenterology

## 2018-02-19 ENCOUNTER — Ambulatory Visit (AMBULATORY_SURGERY_CENTER): Payer: Self-pay | Admitting: *Deleted

## 2018-02-19 VITALS — Ht 64.0 in | Wt 190.0 lb

## 2018-02-19 DIAGNOSIS — Z1211 Encounter for screening for malignant neoplasm of colon: Secondary | ICD-10-CM

## 2018-02-19 MED ORDER — NA SULFATE-K SULFATE-MG SULF 17.5-3.13-1.6 GM/177ML PO SOLN: 1.0000 | Freq: Once | ORAL | 0 refills | Status: AC

## 2018-02-19 NOTE — Progress Notes (Signed)
No egg or soy allergy known to patient  No issues with past sedation with any surgeries  or procedures, no intubation problems  No diet pills per patient No home 02 use per patient  No blood thinners per patient  Pt denies issues with constipation  No A fib or A flutter  EMMI video sent to pt's e mail  Suprep $15 coupon to pt

## 2018-02-24 ENCOUNTER — Encounter: Payer: Self-pay | Admitting: Internal Medicine

## 2018-02-26 ENCOUNTER — Other Ambulatory Visit: Payer: Self-pay | Admitting: Internal Medicine

## 2018-02-26 DIAGNOSIS — L247 Irritant contact dermatitis due to plants, except food: Secondary | ICD-10-CM | POA: Insufficient documentation

## 2018-02-26 MED ORDER — CLOBETASOL PROPIONATE 0.05 % EX OINT: 1.0000 "application " | TOPICAL_OINTMENT | Freq: Two times a day (BID) | CUTANEOUS | 1 refills | Status: AC

## 2018-02-26 MED ORDER — METHYLPREDNISOLONE 4 MG PO TBPK: ORAL_TABLET | ORAL | 0 refills | Status: AC

## 2018-03-05 ENCOUNTER — Encounter: Payer: Self-pay | Admitting: Gastroenterology

## 2018-03-08 ENCOUNTER — Telehealth: Payer: Self-pay | Admitting: Gastroenterology

## 2018-03-08 DIAGNOSIS — J209 Acute bronchitis, unspecified: Secondary | ICD-10-CM | POA: Diagnosis not present

## 2018-03-08 NOTE — Telephone Encounter (Signed)
Noted thank you

## 2018-03-09 ENCOUNTER — Encounter: Payer: Self-pay | Admitting: Gastroenterology

## 2018-03-14 ENCOUNTER — Telehealth: Payer: Self-pay | Admitting: Gastroenterology

## 2018-03-14 NOTE — Telephone Encounter (Signed)
ok 

## 2018-03-14 NOTE — Telephone Encounter (Signed)
Patient cx procedure for tomorrow states that she has bronchitis, will cb to reschedule

## 2018-03-15 ENCOUNTER — Encounter: Payer: Self-pay | Admitting: Gastroenterology

## 2018-03-16 ENCOUNTER — Ambulatory Visit (INDEPENDENT_AMBULATORY_CARE_PROVIDER_SITE_OTHER): Payer: BLUE CROSS/BLUE SHIELD | Admitting: Internal Medicine

## 2018-03-16 ENCOUNTER — Encounter: Payer: Self-pay | Admitting: Internal Medicine

## 2018-03-16 ENCOUNTER — Ambulatory Visit (INDEPENDENT_AMBULATORY_CARE_PROVIDER_SITE_OTHER)
Admission: RE | Admit: 2018-03-16 | Discharge: 2018-03-16 | Disposition: A | Discharge status: Home / Self Care | Payer: BLUE CROSS/BLUE SHIELD | Source: Ambulatory Visit | Attending: Internal Medicine | Admitting: Internal Medicine

## 2018-03-16 ENCOUNTER — Telehealth: Payer: Self-pay

## 2018-03-16 ENCOUNTER — Other Ambulatory Visit (INDEPENDENT_AMBULATORY_CARE_PROVIDER_SITE_OTHER): Payer: BLUE CROSS/BLUE SHIELD

## 2018-03-16 VITALS — BP 132/80 | HR 62 | Temp 98.0°F | Resp 16 | Wt 192.0 lb

## 2018-03-16 DIAGNOSIS — I10 Essential (primary) hypertension: Secondary | ICD-10-CM

## 2018-03-16 DIAGNOSIS — R05 Cough: Secondary | ICD-10-CM

## 2018-03-16 DIAGNOSIS — R059 Cough, unspecified: Secondary | ICD-10-CM

## 2018-03-16 DIAGNOSIS — J069 Acute upper respiratory infection, unspecified: Secondary | ICD-10-CM

## 2018-03-16 LAB — BASIC METABOLIC PANEL
BUN: 13 mg/dL (ref 6–23)
CO2: 31 mEq/L (ref 19–32)
Calcium: 9.2 mg/dL (ref 8.4–10.5)
Chloride: 102 mEq/L (ref 96–112)
Creatinine, Ser: 0.74 mg/dL (ref 0.40–1.20)
GFR: 81.45 mL/min (ref >60.00)
Glucose, Bld: 78 mg/dL (ref 70–99)
Potassium: 3.9 mEq/L (ref 3.5–5.1)
Sodium: 139 mEq/L (ref 135–145)

## 2018-03-16 NOTE — Progress Notes (Signed)
Subjective:  Patient ID: Cheryl Taylor, female    DOB: December 08, 1965  Age: 53 y.o. MRN: [id number]  CC: URI   HPI Cheryl Taylor presents for concerns about a 3-week history of cough that started with sore throat.  The cough is nonproductive.  She was seen a week ago at an urgent care center and was given a course of steroids and an antibiotic.  She said neither one have helped very much.  Her main symptoms now are chills, night sweats, fatigue, myalgias, and arthralgias.  Outpatient Medications Prior to Visit  Medication Sig Dispense Refill  . atorvastatin (LIPITOR) 20 MG tablet Take 1 tablet (20 mg total) by mouth daily. 90 tablet 1  . escitalopram (LEXAPRO) 20 MG tablet Take 1 tablet (20 mg total) by mouth daily. 90 tablet 1  . Krill Oil 1000 MG CAPS Take by mouth daily.    Marland Kitchen L-Tryptophan 500 MG TABS Take by mouth.    . Melatonin 10 MG TABS Take 10 mg by mouth at bedtime.    . meloxicam (MOBIC) 15 MG tablet Take 1 tablet (15 mg total) by mouth daily. Use daily for 2 weeks then as needed 90 tablet 1  . metoprolol succinate (TOPROL-XL) 50 MG 24 hr tablet TAKE 1 TABLET DAILY (TAKE WITH OR IMMEDIATELY FOLLOWING A MEAL) 90 tablet 1  . omeprazole (PRILOSEC) 20 MG capsule Take 20 mg by mouth daily.    . valACYclovir (VALTREX) 1000 MG tablet TAKE 1 TABLET DAILY 90 tablet 1   No facility-administered medications prior to visit.     ROS Review of Systems  Constitutional: Positive for chills and fatigue. Negative for diaphoresis and fever.  HENT: Positive for sore throat.   Respiratory: Positive for cough. Negative for chest tightness, shortness of breath and wheezing.   Cardiovascular: Negative for chest pain, palpitations and leg swelling.  Gastrointestinal: Negative for abdominal pain, constipation, diarrhea, nausea and vomiting.  Genitourinary: Negative.  Negative for difficulty urinating and dysuria.  Musculoskeletal: Positive for arthralgias and myalgias. Negative for neck pain and neck  stiffness.  Skin: Negative for color change, pallor and rash.  Neurological: Negative.  Negative for dizziness, weakness, light-headedness and headaches.  Hematological: Negative for adenopathy. Does not bruise/bleed easily.  Psychiatric/Behavioral: Negative.     Objective:  BP 132/80   Pulse 62   Temp 98 F (36.7 C) (Oral)   Resp 16   Wt 192 lb (87.1 kg)   SpO2 98%   BMI 32.96 kg/m   BP Readings from Last 3 Encounters:  03/16/18 132/80  02/06/18 124/74  12/21/17 120/72    Wt Readings from Last 3 Encounters:  03/16/18 192 lb (87.1 kg)  02/19/18 190 lb (86.2 kg)  02/06/18 193 lb (87.5 kg)    Physical Exam Vitals signs reviewed.  Constitutional:      Appearance: She is not ill-appearing or diaphoretic.  HENT:     Nose: Nose normal. No congestion or rhinorrhea.     Mouth/Throat:     Mouth: Mucous membranes are moist.     Pharynx: Oropharynx is clear. No oropharyngeal exudate or posterior oropharyngeal erythema.  Eyes:     General: No scleral icterus.    Conjunctiva/sclera: Conjunctivae normal.  Neck:     Musculoskeletal: Normal range of motion and neck supple. No neck rigidity or muscular tenderness.  Cardiovascular:     Rate and Rhythm: Normal rate and regular rhythm.     Heart sounds: Normal heart sounds. No murmur. No friction rub. No gallop.  Pulmonary:     Effort: Pulmonary effort is normal. No respiratory distress.     Breath sounds: Normal breath sounds. No stridor. No wheezing, rhonchi or rales.  Chest:     Chest wall: No tenderness.  Abdominal:     General: Abdomen is flat. Bowel sounds are normal. There is no distension.     Palpations: There is no mass.     Tenderness: There is no abdominal tenderness. There is no guarding.  Musculoskeletal: Normal range of motion.        General: No swelling.     Right lower leg: No edema.     Left lower leg: No edema.  Lymphadenopathy:     Cervical: No cervical adenopathy.  Skin:    General: Skin is warm and  dry.     Coloration: Skin is not jaundiced or pale.     Findings: No erythema or rash.  Neurological:     General: No focal deficit present.     Mental Status: She is oriented to person, place, and time. Mental status is at baseline.  Psychiatric:        Mood and Affect: Mood normal.        Behavior: Behavior normal.     Lab Results  Component Value Date   WBC 9.6 02/06/2018   HGB 11.9 (L) 02/06/2018   HCT 36.6 02/06/2018   PLT 276.0 02/06/2018   GLUCOSE 78 03/16/2018   CHOL 145 02/06/2018   TRIG 127.0 02/06/2018   HDL 41.70 02/06/2018   LDLDIRECT 130.0 04/12/2016   LDLCALC 78 02/06/2018   ALT 26 02/06/2018   AST 23 02/06/2018   NA 139 03/16/2018   K 3.9 03/16/2018   CL 102 03/16/2018   CREATININE 0.74 03/16/2018   BUN 13 03/16/2018   CO2 31 03/16/2018   TSH 1.41 02/06/2018   HGBA1C 5.8 02/06/2018    Dg Chest 2 View  Result Date: 03/16/2018 CLINICAL DATA:  Bronchitis, persistent cough EXAM: CHEST - 2 VIEW COMPARISON:  None. FINDINGS: The heart size and mediastinal contours are within normal limits. Both lungs are clear. Disc degenerative disease of the thoracic spine. IMPRESSION: No acute abnormality of the lungs.  No focal airspace opacity. Electronically Signed   By: Eddie Candle M.D.   On: 03/16/2018 09:11    Assessment & Plan:   Cheryl Taylor was seen today for uri.  Diagnoses and all orders for this visit:  Essential hypertension- Her blood pressure is adequately well controlled.  Electrolytes and renal function are normal. -     Basic metabolic panel; Future  Viral upper respiratory tract infection- Her chest x-ray is normal.  Her symptoms are vague and nonspecific and her exam is nonlocalizing.  I think she is experiencing a post viral syndrome.  I have asked her to continue symptom relief with NyQuil as needed and to get plenty of rest and to stay well-hydrated.   I am having Cheryl Taylor maintain her Melatonin, valACYclovir, metoprolol succinate, meloxicam,  atorvastatin, Krill Oil, omeprazole, L-Tryptophan, and escitalopram.  No orders of the defined types were placed in this encounter.    Follow-up: No follow-ups on file.  [name], MD

## 2018-03-16 NOTE — Telephone Encounter (Signed)
Pt contacted and informed to come upstairs.

## 2018-03-16 NOTE — Telephone Encounter (Signed)
Per PCP - ordered chest xray 2 V for dx cough.

## 2018-03-18 ENCOUNTER — Encounter: Payer: Self-pay | Admitting: Internal Medicine

## 2018-03-18 NOTE — Patient Instructions (Signed)
Viral Respiratory Infection A respiratory infection is an illness that affects part of the respiratory system, such as the lungs, nose, or throat. A respiratory infection that is caused by a virus is called a viral respiratory infection. Common types of viral respiratory infections include:  A cold.  The flu (influenza).  A respiratory syncytial virus (RSV) infection. What are the causes? This condition is caused by a virus. What are the signs or symptoms? Symptoms of this condition include:  A stuffy or runny nose.  Yellow or green nasal discharge.  A cough.  Sneezing.  Fatigue.  Achy muscles.  A sore throat.  Sweating or chills.  A fever.  A headache. How is this diagnosed? This condition may be diagnosed based on:  Your symptoms.  A physical exam.  Testing of nasal swabs. How is this treated? This condition may be treated with medicines, such as:  Antiviral medicine. This may shorten the length of time a person has symptoms.  Expectorants. These make it easier to cough up mucus.  Decongestant nasal sprays.  Acetaminophen or NSAIDs to relieve fever and pain. Antibiotic medicines are not prescribed for viral infections. This is because antibiotics are designed to kill bacteria. They are not effective against viruses. Follow these instructions at home:  Managing pain and congestion  Take over-the-counter and prescription medicines only as told by your health care provider.  If you have a sore throat, gargle with a salt-water mixture 3-4 times a day or as needed. To make a salt-water mixture, completely dissolve -1 tsp of salt in 1 cup of warm water.  Use nose drops made from salt water to ease congestion and soften raw skin around your nose.  Drink enough fluid to keep your urine pale yellow. This helps prevent dehydration and helps loosen up mucus. General instructions  Rest as much as possible.  Do not drink alcohol.  Do not use any products  that contain nicotine or tobacco, such as cigarettes and e-cigarettes. If you need help quitting, ask your health care provider.  Keep all follow-up visits as told by your health care provider. This is important. How is this prevented?   Get an annual flu shot. You may get the flu shot in late summer, fall, or winter. Ask your health care provider when you should get your flu shot.  Avoid exposing others to your respiratory infection. ? Stay home from work or school as told by your health care provider. ? Wash your hands with soap and water often, especially after you cough or sneeze. If soap and water are not available, use alcohol-based hand sanitizer.  Avoid contact with people who are sick during cold and flu season. This is generally fall and winter. Contact a health care provider if:  Your symptoms last for 10 days or longer.  Your symptoms get worse over time.  You have a fever.  You have severe sinus pain in your face or forehead.  The glands in your jaw or neck become very swollen. Get help right away if you:  Feel pain or pressure in your chest.  Have shortness of breath.  Faint or feel like you will faint.  Have severe and persistent vomiting.  Feel confused or disoriented. Summary  A respiratory infection is an illness that affects part of the respiratory system, such as the lungs, nose, or throat. A respiratory infection that is caused by a virus is called a viral respiratory infection.  Common types of viral respiratory infections are a   you will faint.  · Have severe and persistent vomiting.  · Feel confused or disoriented.  Summary  · A respiratory infection is an illness that affects part of the respiratory system, such as the lungs, nose, or throat. A respiratory infection that is caused by a virus is called a viral respiratory infection.  · Common types of viral respiratory infections are a cold, influenza, and respiratory syncytial virus (RSV) infection.  · Symptoms of this condition include a stuffy or runny nose, cough, sneezing, fatigue, achy muscles, sore throat, and fevers or chills.  · Antibiotic medicines are not prescribed for viral infections. This is because antibiotics are designed to kill bacteria. They are not effective against viruses.  This information is not intended to replace advice given to you by  your health care provider. Make sure you discuss any questions you have with your health care provider.  Document Released: 10/27/2004 Document Revised: 02/27/2017 Document Reviewed: 02/27/2017  Elsevier Interactive Patient Education © 2019 [employer]

## 2018-03-19 ENCOUNTER — Other Ambulatory Visit: Payer: Self-pay | Admitting: Internal Medicine

## 2018-03-19 DIAGNOSIS — H6981 Other specified disorders of Eustachian tube, right ear: Secondary | ICD-10-CM

## 2018-03-19 MED ORDER — TRIAMCINOLONE ACETONIDE 55 MCG/ACT NA AERO: 2.0000 | INHALATION_SPRAY | Freq: Every day | NASAL | 3 refills | Status: DC

## 2018-03-19 MED ORDER — LEVOCETIRIZINE DIHYDROCHLORIDE 5 MG PO TABS: 5.0000 mg | ORAL_TABLET | Freq: Every evening | ORAL | 1 refills | Status: DC

## 2018-03-19 MED ORDER — METHYLPREDNISOLONE 4 MG PO TBPK: ORAL_TABLET | ORAL | 0 refills | Status: AC

## 2018-03-26 ENCOUNTER — Other Ambulatory Visit: Payer: Self-pay | Admitting: Internal Medicine

## 2018-03-26 DIAGNOSIS — H66006 Acute suppurative otitis media without spontaneous rupture of ear drum, recurrent, bilateral: Secondary | ICD-10-CM | POA: Insufficient documentation

## 2018-03-26 MED ORDER — AMOXICILLIN-POT CLAVULANATE 875-125 MG PO TABS: 1.0000 | ORAL_TABLET | Freq: Two times a day (BID) | ORAL | 0 refills | Status: AC

## 2018-03-28 ENCOUNTER — Other Ambulatory Visit: Payer: Self-pay | Admitting: Family Medicine

## 2018-03-29 NOTE — Telephone Encounter (Signed)
Please advise 

## 2018-04-27 ENCOUNTER — Other Ambulatory Visit: Payer: Self-pay | Admitting: Family

## 2018-04-27 DIAGNOSIS — E7849 Other hyperlipidemia: Secondary | ICD-10-CM

## 2018-05-24 ENCOUNTER — Other Ambulatory Visit: Payer: Self-pay | Admitting: Internal Medicine

## 2018-05-24 DIAGNOSIS — I10 Essential (primary) hypertension: Secondary | ICD-10-CM

## 2018-07-04 ENCOUNTER — Other Ambulatory Visit: Payer: Self-pay | Admitting: Internal Medicine

## 2018-07-04 DIAGNOSIS — M436 Torticollis: Secondary | ICD-10-CM | POA: Insufficient documentation

## 2018-07-04 MED ORDER — CYCLOBENZAPRINE HCL 5 MG PO TABS: 5.0000 mg | ORAL_TABLET | Freq: Three times a day (TID) | ORAL | 1 refills | Status: DC | PRN

## 2018-08-02 ENCOUNTER — Other Ambulatory Visit: Payer: Self-pay | Admitting: Internal Medicine

## 2018-08-02 DIAGNOSIS — L03032 Cellulitis of left toe: Secondary | ICD-10-CM | POA: Insufficient documentation

## 2018-08-02 MED ORDER — AMOXICILLIN-POT CLAVULANATE 875-125 MG PO TABS: 1.0000 | ORAL_TABLET | Freq: Two times a day (BID) | ORAL | 0 refills | Status: AC

## 2018-08-06 ENCOUNTER — Other Ambulatory Visit: Payer: Self-pay | Admitting: Internal Medicine

## 2018-08-06 ENCOUNTER — Encounter: Payer: Self-pay | Admitting: Internal Medicine

## 2018-08-06 DIAGNOSIS — I10 Essential (primary) hypertension: Secondary | ICD-10-CM

## 2018-08-06 DIAGNOSIS — A6009 Herpesviral infection of other urogenital tract: Secondary | ICD-10-CM

## 2018-08-06 DIAGNOSIS — E7849 Other hyperlipidemia: Secondary | ICD-10-CM

## 2018-08-06 DIAGNOSIS — F418 Other specified anxiety disorders: Secondary | ICD-10-CM

## 2018-08-06 MED ORDER — ESCITALOPRAM OXALATE 20 MG PO TABS: 20.0000 mg | ORAL_TABLET | Freq: Every day | ORAL | 1 refills | Status: DC

## 2018-08-06 MED ORDER — ATORVASTATIN CALCIUM 20 MG PO TABS: 20.0000 mg | ORAL_TABLET | Freq: Every day | ORAL | 1 refills | Status: DC

## 2018-08-06 MED ORDER — METOPROLOL SUCCINATE ER 50 MG PO TB24: ORAL_TABLET | ORAL | 1 refills | Status: DC

## 2018-08-06 MED ORDER — VALACYCLOVIR HCL 1 G PO TABS: 1000.0000 mg | ORAL_TABLET | Freq: Every day | ORAL | 1 refills | Status: DC

## 2018-08-25 ENCOUNTER — Other Ambulatory Visit: Payer: Self-pay | Admitting: Internal Medicine

## 2018-08-25 DIAGNOSIS — F418 Other specified anxiety disorders: Secondary | ICD-10-CM

## 2018-10-15 DIAGNOSIS — M542 Cervicalgia: Secondary | ICD-10-CM | POA: Diagnosis not present

## 2018-11-06 ENCOUNTER — Other Ambulatory Visit: Payer: Self-pay | Admitting: Internal Medicine

## 2018-11-06 DIAGNOSIS — M159 Polyosteoarthritis, unspecified: Secondary | ICD-10-CM | POA: Insufficient documentation

## 2018-11-06 DIAGNOSIS — M8949 Other hypertrophic osteoarthropathy, multiple sites: Secondary | ICD-10-CM | POA: Insufficient documentation

## 2018-11-06 MED ORDER — NAPROXEN-ESOMEPRAZOLE 500-20 MG PO TBEC: 1.0000 | DELAYED_RELEASE_TABLET | Freq: Two times a day (BID) | ORAL | 0 refills | Status: DC | PRN

## 2019-01-21 ENCOUNTER — Other Ambulatory Visit: Payer: Self-pay | Admitting: Internal Medicine

## 2019-01-21 DIAGNOSIS — A6009 Herpesviral infection of other urogenital tract: Secondary | ICD-10-CM

## 2019-01-21 DIAGNOSIS — I10 Essential (primary) hypertension: Secondary | ICD-10-CM

## 2019-01-21 DIAGNOSIS — L600 Ingrowing nail: Secondary | ICD-10-CM | POA: Insufficient documentation

## 2019-01-21 DIAGNOSIS — F418 Other specified anxiety disorders: Secondary | ICD-10-CM

## 2019-01-21 MED ORDER — VALACYCLOVIR HCL 1 G PO TABS: 1000.0000 mg | ORAL_TABLET | Freq: Every day | ORAL | 1 refills | Status: DC

## 2019-01-21 MED ORDER — METOPROLOL SUCCINATE ER 50 MG PO TB24: ORAL_TABLET | ORAL | 1 refills | Status: DC

## 2019-01-21 MED ORDER — ESCITALOPRAM OXALATE 20 MG PO TABS: 20.0000 mg | ORAL_TABLET | Freq: Every day | ORAL | 1 refills | Status: DC

## 2019-01-28 ENCOUNTER — Telehealth: Payer: Self-pay

## 2019-01-28 NOTE — Telephone Encounter (Signed)
Per PCP pt is due for appointment soon. Left VM informing pt of same.

## 2019-01-30 NOTE — Telephone Encounter (Signed)
Left another VM informing pt of same.

## 2019-02-05 ENCOUNTER — Encounter: Payer: Self-pay | Admitting: Internal Medicine

## 2019-02-05 ENCOUNTER — Ambulatory Visit (INDEPENDENT_AMBULATORY_CARE_PROVIDER_SITE_OTHER): Payer: BC Managed Care – PPO | Admitting: Internal Medicine

## 2019-02-05 ENCOUNTER — Other Ambulatory Visit: Payer: Self-pay

## 2019-02-05 VITALS — BP 130/78 | HR 70 | Temp 98.8°F | Resp 16 | Ht 64.0 in | Wt 202.0 lb

## 2019-02-05 DIAGNOSIS — Z1231 Encounter for screening mammogram for malignant neoplasm of breast: Secondary | ICD-10-CM | POA: Insufficient documentation

## 2019-02-05 DIAGNOSIS — I10 Essential (primary) hypertension: Secondary | ICD-10-CM

## 2019-02-05 DIAGNOSIS — G4733 Obstructive sleep apnea (adult) (pediatric): Secondary | ICD-10-CM | POA: Diagnosis not present

## 2019-02-05 DIAGNOSIS — Z1211 Encounter for screening for malignant neoplasm of colon: Secondary | ICD-10-CM | POA: Insufficient documentation

## 2019-02-05 DIAGNOSIS — G4710 Hypersomnia, unspecified: Secondary | ICD-10-CM | POA: Diagnosis not present

## 2019-02-05 DIAGNOSIS — Z124 Encounter for screening for malignant neoplasm of cervix: Secondary | ICD-10-CM

## 2019-02-05 DIAGNOSIS — L600 Ingrowing nail: Secondary | ICD-10-CM

## 2019-02-05 MED ORDER — ARMODAFINIL 50 MG PO TABS: 100.0000 mg | ORAL_TABLET | Freq: Every day | ORAL | 0 refills | Status: DC

## 2019-02-05 NOTE — Progress Notes (Signed)
Subjective:  Patient ID: Cheryl Taylor, female    DOB: [birth year]  Age: 54 y.o. MRN: [id number]  CC: Hypertension  This visit occurred during the SARS-CoV-2 public health emergency.  Safety protocols were in place, including screening questions prior to the visit, additional usage of staff PPE, and extensive cleaning of exam room while observing appropriate contact time as indicated for disinfecting solutions.    HPI Cheryl Taylor presents for f/up - She tells me that her blood pressure has been well controlled but she has not recently been working on her lifestyle modifications.  She complains of chronic fatigue, excessive daytime sleepiness, and frequent napping.  She has a history of sleep apnea and tells me she is compliant with CPAP usage.  Outpatient Medications Prior to Visit  Medication Sig Dispense Refill  . atorvastatin (LIPITOR) 20 MG tablet Take 1 tablet (20 mg total) by mouth daily. 90 tablet 1  . escitalopram (LEXAPRO) 20 MG tablet Take 1 tablet (20 mg total) by mouth daily. 90 tablet 1  . L-Tryptophan 500 MG TABS Take by mouth.    . levocetirizine (XYZAL) 5 MG tablet Take 1 tablet (5 mg total) by mouth every evening. 90 tablet 1  . metoprolol succinate (TOPROL-XL) 50 MG 24 hr tablet Take with or immediately following a meal. 90 tablet 1  . Naproxen-Esomeprazole (VIMOVO) 500-20 MG TBEC Take 1 tablet by mouth 2 (two) times daily as needed. 180 tablet 0  . triamcinolone (NASACORT) 55 MCG/ACT AERO nasal inhaler Place 2 sprays into the nose daily. 32.4 mL 3  . valACYclovir (VALTREX) 1000 MG tablet Take 1 tablet (1,000 mg total) by mouth daily. 90 tablet 1   No facility-administered medications prior to visit.    ROS Review of Systems  Constitutional: Positive for fatigue and unexpected weight change (wt gain). Negative for diaphoresis.  HENT: Negative.   Eyes: Negative.   Respiratory: Positive for apnea. Negative for choking, shortness of breath and wheezing.     Cardiovascular: Negative for chest pain, palpitations and leg swelling.  Gastrointestinal: Negative for abdominal pain, constipation, nausea and vomiting.  Endocrine: Negative.  Negative for cold intolerance and heat intolerance.  Genitourinary: Negative.  Negative for difficulty urinating.  Musculoskeletal: Negative.   Skin: Negative.   Neurological: Negative.  Negative for dizziness, weakness and light-headedness.  Hematological: Negative.   Psychiatric/Behavioral: Negative for behavioral problems, decreased concentration, hallucinations, sleep disturbance and suicidal ideas. The patient is not nervous/anxious.     Objective:  BP 130/78 (BP Location: Left Arm, Patient Position: Sitting, Cuff Size: Large)   Pulse 70   Temp 98.8 F (37.1 C) (Oral)   Resp 16   Ht 5\' 4"  (1.626 m)   Wt 202 lb (91.6 kg)   SpO2 97%   BMI 34.67 kg/m   BP Readings from Last 3 Encounters:  02/05/19 130/78  03/16/18 132/80  02/06/18 124/74    Wt Readings from Last 3 Encounters:  02/05/19 202 lb (91.6 kg)  03/16/18 192 lb (87.1 kg)  02/19/18 190 lb (86.2 kg)    Physical Exam Vitals reviewed.  Constitutional:      Appearance: She is obese.  HENT:     Nose: Nose normal.     Mouth/Throat:     Mouth: Mucous membranes are moist.  Eyes:     General: No scleral icterus.    Conjunctiva/sclera: Conjunctivae normal.  Cardiovascular:     Rate and Rhythm: Normal rate and regular rhythm.     Heart sounds: No murmur.  Pulmonary:     Effort: Pulmonary effort is normal.     Breath sounds: No stridor. No wheezing, rhonchi or rales.  Abdominal:     General: Abdomen is flat. Bowel sounds are normal. There is no distension.     Palpations: Abdomen is soft. There is no hepatomegaly, splenomegaly or mass.     Tenderness: There is no abdominal tenderness.  Musculoskeletal:        General: Normal range of motion.     Cervical back: Neck supple.     Right lower leg: No edema.     Left lower leg: No edema.   Lymphadenopathy:     Cervical: No cervical adenopathy.  Skin:    General: Skin is warm and dry.  Neurological:     General: No focal deficit present.  Psychiatric:        Mood and Affect: Mood normal.        Behavior: Behavior normal.     Lab Results  Component Value Date   WBC 9.6 02/06/2018   HGB 11.9 (L) 02/06/2018   HCT 36.6 02/06/2018   PLT 276.0 02/06/2018   GLUCOSE 78 03/16/2018   CHOL 145 02/06/2018   TRIG 127.0 02/06/2018   HDL 41.70 02/06/2018   LDLDIRECT 130.0 04/12/2016   LDLCALC 78 02/06/2018   ALT 26 02/06/2018   AST 23 02/06/2018   NA 139 03/16/2018   K 3.9 03/16/2018   CL 102 03/16/2018   CREATININE 0.74 03/16/2018   BUN 13 03/16/2018   CO2 31 03/16/2018   TSH 1.41 02/06/2018   HGBA1C 5.8 02/06/2018    DG Chest 2 View  Result Date: 03/16/2018 CLINICAL DATA:  Bronchitis, persistent cough EXAM: CHEST - 2 VIEW COMPARISON:  None. FINDINGS: The heart size and mediastinal contours are within normal limits. Both lungs are clear. Disc degenerative disease of the thoracic spine. IMPRESSION: No acute abnormality of the lungs.  No focal airspace opacity. Electronically Signed   By: Eddie Candle M.D.   On: 03/16/2018 09:11    Assessment & Plan:   Cheryl Taylor was seen today for hypertension.  Diagnoses and all orders for this visit:  Excessive somnolence disorder- She may be on the verge of developing narcolepsy.  I recommended that she treat this with armodafinil.  She will start at 50 to 100 mg a day and we will increase the dose over the next few weeks and months based on her response. -     Armodafinil 50 MG tablet; Take 2 tablets (100 mg total) by mouth daily.  OSA (obstructive sleep apnea) -     Armodafinil 50 MG tablet; Take 2 tablets (100 mg total) by mouth daily.  Screening for cervical cancer -     Ambulatory referral to Gynecology  Ingrown toenail  Visit for screening mammogram -     MM Digital Screening; Future  Screen for colon cancer -      Ambulatory referral to Gastroenterology  Essential hypertension- Her blood pressure is adequately well controlled.   I am having Cheryl Taylor start on Armodafinil. I am also having her maintain her L-Tryptophan, levocetirizine, triamcinolone, atorvastatin, Naproxen-Esomeprazole, valACYclovir, metoprolol succinate, and escitalopram.  Meds ordered this encounter  Medications  . Armodafinil 50 MG tablet    Sig: Take 2 tablets (100 mg total) by mouth daily.    Dispense:  60 tablet    Refill:  0     Follow-up: Return in about 3 months (around 05/06/2019).  [name], MD

## 2019-02-05 NOTE — Patient Instructions (Signed)
Narcolepsy Narcolepsy is a neurological disorder that causes people to fall asleep suddenly and without control (have sleep attacks) during the daytime. It is a lifelong disorder. Narcolepsy disrupts the sleep cycle at night, which then causes daytime sleepiness. What are the causes? The cause of narcolepsy is not fully understood, but it may be related to:  Low levels of hypocretin, a chemical (neurotransmitter) in the brain that controls sleep and wake cycles. Hypocretin imbalance may be caused by: ? Abnormal genes that are passed from parent to child (inherited). ? An autoimmune disease in which the body's defense system (immune system) attacks the brain cells that make hypocretin.  Infection, tumor, or injury in the area of the brain that controls sleep.  Exposure to poisons (toxins), such as heavy metals, pesticides, and secondhand smoke. What are the signs or symptoms? Symptoms of this condition include:  Excessive daytime sleepiness. This is the most common symptom and is usually the first symptom you will notice. This may affect your performance at work or school.  Sleep attacks. You may fall asleep in the middle of an activity, especially low-energy activities like reading or watching TV.  Feeling like you cannot think clearly and trouble focusing or remembering things. You may also feel depressed.  Sudden muscle weakness (cataplexy). When this occurs, your speech may become slurred, or your knees may buckle. Cataplexy is usually triggered by surprise, anger, fear, or laughter.  Losing the ability to speak or move (sleep paralysis). This may occur just as you start to fall asleep or wake up. You will be aware of the paralysis. It usually lasts for just a few seconds or minutes.  Seeing, hearing, tasting, smelling, or feeling things that are not real (hallucinations). Hallucinations may occur with sleep paralysis. They can happen when you are falling asleep, waking up, or  dozing.  Trouble staying asleep at night (insomnia) and restless sleep. How is this diagnosed? This condition may be diagnosed based on:  A physical exam to rule out any other problems that may be causing your symptoms. You may be asked to write down your sleeping patterns for several weeks in a sleep diary. This will help your health care provider make a diagnosis.  Sleep studies that measure how well your REM sleep is regulated. These tests also measure your heart rate, breathing, movement, and brain waves. These tests include: ? An overnight sleep study (polysomnogram). ? A daytime sleep study that is done while you take several naps during the day (multiple sleep latency test, MSLT). This test measures how quickly you fall asleep and how quickly you enter REM sleep.  Removal of spinal fluid to measure hypocretin levels. How is this treated? There is no cure for this condition, but treatment can help relieve symptoms. Treatment may include:  Lifestyle and sleeping strategies to help you cope with the condition, such as: ? Exercising regularly. ? Maintaining a regular sleep schedule. ? Avoiding caffeine and large meals before bed.  Medicines. These may include: ? Medicines that help keep you awake and alert (stimulants) to fight daytime sleepiness. ? Medicines that treat depression (antidepressants). These may be used to treat cataplexy. ? Sodium oxybate. This is a strong medicine to help you relax (sedative) that you may take at night. It can help control daytime sleepiness and cataplexy.  Other treatments may include mental health counseling or joining a support group. Follow these instructions at home: Sleeping habits   Get about 8 hours of sleep every night.  Go to   sleep and get up at about the same time every day.  Keep your bedroom dark, quiet, and comfortable.  When you feel very tired, take short naps. Schedule naps so that you take them at about the same time every  day.  Before bedtime: ? Avoid bright lights and screens. ? Relax. Try activities like reading or taking a warm bath. Activity  Get at least 20 minutes of exercise every day. This will help you sleep better at night and reduce daytime sleepiness.  Avoid exercising within 3 hours of bedtime.  Do not drive or use heavy machinery if you are sleepy. If possible, take a nap before driving.  Do not swim or go out on the water without a life jacket. Eating and drinking  Do not drink alcohol or caffeinated beverages within 4-5 hours of bedtime.  Do not eat a large meal before bedtime.  Eat meals at about the same times every day. General instructions   Take over-the-counter and prescription medicines only as told by your health care provider.  Keep a sleep diary as told by your health care provider.  Tell your employer or teachers that you have narcolepsy. You may be able to adjust your schedule to include time for naps.  Do not use any products that contain nicotine or tobacco, such as cigarettes, e-cigarettes, and chewing tobacco. If you need help quitting, ask your health care provider.  Keep all follow-up visits as told by your health care provider. This is important. Where to find more information  National Institute of Neurological Disorders: www.ninds.nih.gov Contact a health care provider if:  Your symptoms are not getting better.  You have increasingly high blood pressure (hypertension).  You have changes in your heart rhythm.  You are having a hard time determining what is real and what is not (psychosis). Get help right away if you:  Hurt yourself during a sleep attack or an attack of cataplexy.  Have chest pain.  Have trouble breathing. These symptoms may represent a serious problem that is an emergency. Do not wait to see if the symptoms will go away. Get medical help right away. Call your local emergency services (911 in the [place] Do not drive yourself to the  hospital. Summary  Narcolepsy is a neurological disorder that causes people to fall asleep suddenly, and without control, during the daytime (sleep attacks). It is a lifelong disorder.  There is no cure for this condition, but treatment can help relieve symptoms.  Go to sleep and get up at about the same time every day. Follow instructions about sleep and activities as told by your health care provider.  Take over-the-counter and prescription medicines only as told by your health care provider. This information is not intended to replace advice given to you by your health care provider. Make sure you discuss any questions you have with your health care provider. Document Revised: 08/29/2018 Document Reviewed: 08/29/2018 Elsevier Patient Education  2020 [employer]  

## 2019-02-06 ENCOUNTER — Encounter: Payer: Self-pay | Admitting: Internal Medicine

## 2019-02-12 ENCOUNTER — Ambulatory Visit (INDEPENDENT_AMBULATORY_CARE_PROVIDER_SITE_OTHER): Payer: BC Managed Care – PPO | Admitting: Sports Medicine

## 2019-02-12 ENCOUNTER — Other Ambulatory Visit: Payer: Self-pay

## 2019-02-12 ENCOUNTER — Encounter: Payer: Self-pay | Admitting: Sports Medicine

## 2019-02-12 DIAGNOSIS — M79675 Pain in left toe(s): Secondary | ICD-10-CM

## 2019-02-12 DIAGNOSIS — L600 Ingrowing nail: Secondary | ICD-10-CM

## 2019-02-12 DIAGNOSIS — M79674 Pain in right toe(s): Secondary | ICD-10-CM

## 2019-02-12 MED ORDER — NEOMYCIN-POLYMYXIN-HC 3.5-10000-1 OT SOLN: OTIC | 0 refills | Status: DC

## 2019-02-12 NOTE — Patient Instructions (Signed)

## 2019-02-12 NOTE — Progress Notes (Addendum)
Subjective: Cheryl Taylor is a 54 y.o. female patient presents to office today complaining of a moderately painful incurvated, red, hot, swollen lateral nail border of the 1st toe on the Left and Right foot. This has been present since before christmas. Patient has treated this by soaking and trimming. Patient denies fever/chills/nausea/vomitting/any other related constitutional symptoms at this time.  Review of Systems  All other systems reviewed and are negative.    Patient Active Problem List   Diagnosis Date Noted  . Excessive somnolence disorder 02/05/2019  . Visit for screening mammogram 02/05/2019  . Screen for colon cancer 02/05/2019  . Ingrown toenail 01/21/2019  . Primary osteoarthritis involving multiple joints 11/06/2018  . Insulin resistance 05/19/2016  . Class 1 obesity without serious comorbidity with body mass index (BMI) of 32.0 to 32.9 in adult 05/19/2016  . OSA (obstructive sleep apnea) 02/10/2015  . Screening for cervical cancer 02/06/2015  . Ovarian cyst, left 11/07/2012  . Hepatic hemangioma 11/07/2012  . Preventative health care 12/15/2011  . Raynaud's disease 12/15/2011  . Hyperlipidemia 12/15/2011  . 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 Essential hypertension 06/07/2010  . Depression 04/20/2010  . Vitamin D deficiency 04/20/2010    Current Outpatient Medications on File Prior to Visit  Medication Sig Dispense Refill  . Armodafinil 50 MG tablet Take 2 tablets (100 mg total) by mouth daily. 60 tablet 0  . atorvastatin (LIPITOR) 20 MG tablet Take 1 tablet (20 mg total) by mouth daily. 90 tablet 1  . escitalopram (LEXAPRO) 20 MG tablet Take 1 tablet (20 mg total) by mouth daily. 90 tablet 1  . L-Tryptophan 500 MG TABS Take by mouth.    . levocetirizine (XYZAL) 5 MG tablet Take 1 tablet (5 mg total) by mouth every evening. 90 tablet 1  . metoprolol succinate (TOPROL-XL) 50 MG 24 hr tablet Take with or immediately following a meal. 90  tablet 1  . Naproxen-Esomeprazole (VIMOVO) 500-20 MG TBEC Take 1 tablet by mouth 2 (two) times daily as needed. 180 tablet 0  . triamcinolone (NASACORT) 55 MCG/ACT AERO nasal inhaler Place 2 sprays into the nose daily. 32.4 mL 3  . valACYclovir (VALTREX) 1000 MG tablet Take 1 tablet (1,000 mg total) by mouth daily. 90 tablet 1   No current facility-administered medications on file prior to visit.    No Known Allergies  Objective:  There were no vitals filed for this visit.  General: Well developed, nourished, in no acute distress, alert and oriented x3   Dermatology: Skin is warm, dry and supple bilateral. Left and Right hallux nail appears to be severely incurvated with hyperkeratosis formation at the distal aspects of the lateral nail border. (+) Erythema. (+) Edema. (-) serosanguous  drainage present. The remaining nails appear unremarkable at this time. There are no open sores, lesions or other signs of infection  present.  Vascular: Dorsalis Pedis artery and Posterior Tibial artery pedal pulses are 2/4 bilateral with immedate capillary fill time. Pedal hair growth present. No lower extremity edema.   Neruologic: Grossly intact via light touch bilateral.  Musculoskeletal: Tenderness to palpation of the Left and right hallux lateral nail fold(s). Muscular strength within normal limits in all groups bilateral.   Assesement and Plan: Problem List Items Addressed This Visit    None    Visit Diagnoses    Ingrown nail    -  Primary   Toe pain, bilateral          -Discussed  treatment alternatives and plan of care; Explained permanent/temporary nail avulsion and post procedure course to patient. Patient elects for PNA Right and left hallux lateral margins - After a verbal and written consent, injected 3 ml of a 50:50 mixture of 2% plain  lidocaine and 0.5% plain marcaine in a normal hallux block fashion. Next, a  betadine prep was performed. Anesthesia was tested and found to be  appropriate.  The offending Left and Right hallux lateral nail borders were then incised from the hyponychium to the epinychium. The offending nail border was removed and cleared from the field. The area was curretted for any remaining nail or spicules. Phenol application performed and the area was then flushed with alcohol and dressed with antibiotic cream and a dry sterile dressing. -Patient was instructed to leave the dressing intact for today and begin soaking in a weak solution of betadine or Epsom salt and water tomorrow. Patient was instructed to soak for 15-20 minutes each day and apply neosporin/corticosporin and a gauze or bandaid dressing each day. -Patient was instructed to monitor the toe for signs of infection and return to office if toe becomes red, hot or swollen. -Advised ice, elevation, and tylenol or motrin if needed for pain.  -Patient is to return in 2 weeks for follow up care/nail check or sooner if problems arise.  Landis Martins, DPM

## 2019-02-17 ENCOUNTER — Other Ambulatory Visit: Payer: Self-pay | Admitting: Internal Medicine

## 2019-02-17 DIAGNOSIS — G4733 Obstructive sleep apnea (adult) (pediatric): Secondary | ICD-10-CM

## 2019-02-17 DIAGNOSIS — G4710 Hypersomnia, unspecified: Secondary | ICD-10-CM

## 2019-02-17 DIAGNOSIS — I10 Essential (primary) hypertension: Secondary | ICD-10-CM

## 2019-02-17 MED ORDER — METOPROLOL SUCCINATE ER 25 MG PO TB24: 25.0000 mg | ORAL_TABLET | Freq: Every day | ORAL | 0 refills | Status: DC

## 2019-02-17 MED ORDER — ARMODAFINIL 250 MG PO TABS: 250.0000 mg | ORAL_TABLET | Freq: Every day | ORAL | 1 refills | Status: DC

## 2019-02-18 ENCOUNTER — Other Ambulatory Visit: Payer: Self-pay | Admitting: Internal Medicine

## 2019-02-18 DIAGNOSIS — G4733 Obstructive sleep apnea (adult) (pediatric): Secondary | ICD-10-CM

## 2019-02-18 DIAGNOSIS — G4710 Hypersomnia, unspecified: Secondary | ICD-10-CM

## 2019-02-26 ENCOUNTER — Ambulatory Visit: Payer: BC Managed Care – PPO | Admitting: Sports Medicine

## 2019-02-26 ENCOUNTER — Institutional Professional Consult (permissible substitution): Payer: BC Managed Care – PPO | Admitting: Neurology

## 2019-02-28 ENCOUNTER — Encounter: Payer: Self-pay | Admitting: Neurology

## 2019-02-28 ENCOUNTER — Ambulatory Visit: Payer: BC Managed Care – PPO | Admitting: Neurology

## 2019-02-28 ENCOUNTER — Other Ambulatory Visit: Payer: Self-pay

## 2019-02-28 VITALS — BP 124/84 | HR 74 | Temp 97.3°F | Ht 64.0 in | Wt 200.0 lb

## 2019-02-28 DIAGNOSIS — R635 Abnormal weight gain: Secondary | ICD-10-CM

## 2019-02-28 DIAGNOSIS — I10 Essential (primary) hypertension: Secondary | ICD-10-CM | POA: Diagnosis not present

## 2019-02-28 DIAGNOSIS — E6609 Other obesity due to excess calories: Secondary | ICD-10-CM | POA: Diagnosis not present

## 2019-02-28 DIAGNOSIS — G4733 Obstructive sleep apnea (adult) (pediatric): Secondary | ICD-10-CM

## 2019-02-28 DIAGNOSIS — G4719 Other hypersomnia: Secondary | ICD-10-CM | POA: Insufficient documentation

## 2019-02-28 DIAGNOSIS — Z9989 Dependence on other enabling machines and devices: Secondary | ICD-10-CM

## 2019-02-28 DIAGNOSIS — R6889 Other general symptoms and signs: Secondary | ICD-10-CM | POA: Insufficient documentation

## 2019-02-28 DIAGNOSIS — G4710 Hypersomnia, unspecified: Secondary | ICD-10-CM

## 2019-02-28 DIAGNOSIS — Z6834 Body mass index (BMI) 34.0-34.9, adult: Secondary | ICD-10-CM

## 2019-02-28 NOTE — Progress Notes (Addendum)
SLEEP MEDICINE CLINIC    Provider:  Larey Seat, MD  Primary Care Physician:  Janith Lima, MD [place] [address]     Referring Provider: Janith Lima, [place],  [place] [address]          Chief Complaint according to patient   Patient presents with:    . New Patient (Initial Visit)     RN : pt started CPAP in 2017. she is no longer established with a sleep MD. DME Huey Romans. she complains of extreme fatigue during the day. last week she was started on armodafinil. she has felt that has helped with the daytime fatigue      HISTORY OF PRESENT ILLNESS:  Cheryl Taylor is a 54 y.o. year old Caucasian female patient seen here on 02/28/2019. She is referred for persistent daytime sleepiness while compliant with CPAP.  Chief concern according to patient : [name] has been a moderately compliant CPAP user over the last 3 years about, but during the pandemic she has gained weight about 40 pounds she reports and her level of fatigue has just exacerbated.  CPAP never made her feel as if she was too much better rested but it helped her snoring which and return made her husband's night more restful.   I have the pleasure of seeing Cheryl Taylor today, a right -handed White or Caucasian female with a possible sleep disorder.  She has a medical history of  Anxiety, Arthritis, Back pain, Chest pain, Depression, Alcohol and Drug use, GERD (gastroesophageal reflux disease), HTN (hypertension), Hyperlipidemia, Obesity (08/10/2014), Obesity, Pain in both feet, Perimenopausal (11/18/2013), Raynaud's disease, Screening for cervical cancer (02/06/2015), OSA/ Sleep apnea on CPAP , SOB (shortness of breath), and Vitamin D deficiency The patient had the first sleep study in the year 2017.  Her sleep study was performed on 01 April 2015 as an attended sleep study under the guidance of Dr. Halford Chessman.  She took Benadryl at night to help her sleep, the respiratory  parameters for this attended sleep study showed an AHI of only 3.7/h and RDI of 7.5/h and REM sleep latency of 230 minutes.  It took her well over 40 minutes to fall asleep and her sleep efficiency was only 76%.  The diagnosis was hypersomnia and snoring.  She then underwent a home sleep test hoping to get a better sleep efficiency this was repeated on 07 May 2015, again the main reason for the study was hypersomnia this time she slept much better and was diagnosed with an AHI of 27.4/h of sleep there was no central apnea noted but looking back on the breakdown she did actually have 12 central apneas 68 obstructive apneas 7 mixed apneas and 96 hypopneas.  The diagnosis of sleep apnea in a more complex way was actually made but he was diagnosed as having obstructive sleep apnea moderate severe started on an auto titration CPAP 5 through 15 cmH2O on which she has remained since.  My nurse was able to download the data from the current machine over the last 25 days the patient has used the machine 20 days equal to a compliance rate of 80 % , the average user time on days used was 7 hours 12 minutes, the minimum and maximum pressure are equal to the original recommendations 5 through 15 cmH2O with 3 cm EPR-expiratory pressure relief.  Residual AHI is higher than I like 6.7/h the 95th percentile pressure used is  12.4 cmH2O.  Few central apneas have emerged there is a moderate air leak.  The patient continues to report an Epworth Sleepiness Scale endorsed at 17 out of 24 points.  Family medical /sleep history: no other family member on CPAP with OSA, insomnia, sleep walkers.    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  The patient currently works in IT , used to work for [employer]. Pets are present. Tobacco use; never.  ETOH use; quit in 2013-  ,  Caffeine  intake in form of Coffee( 3 cup ) Soda( none) Tea ( none ) - nor energy drinks. Regular exercise in form of none during the pandemic.   Hobbies : gardening, vegetable.    Sleep habits are as follows:  The patient's dinner time is between 6 PM. The patient goes to bed at 10 PM  Since Armodafinil - 10 days ago and continues to sleep for several hours, wakes for 1-2 bathroom breaks, the first time at 2 AM.   The preferred sleep position is supine and lateral-, with the support of 2 pillows.  Dreams are reportedly frequent/vivid. 9  AM is the usual rise time. The patient wakes up with an alarm.   She reports not feeling refreshed or restored in AM, with symptoms such as dry mouth- only when not using CPAP , but no morning headaches-  There is residual fatigue. Naps are taken frequently, and she dreams in her naps.  The dreams are not always refreshing. No sleep paralysis, dream intrusion, dreams exhausting - no cataplexy.    Review of Systems: Out of a complete 14 system review, the patient complains of only the following symptoms, and all other reviewed systems are negative.:  Fatigue, sleepiness , snoring, restless sleep , vivid dreams.    How likely are you to doze in the following situations: 0 = not likely, 1 = slight chance, 2 = moderate chance, 3 = high chance   Sitting and Reading? Watching Television? Sitting inactive in a public place (theater or meeting)? As a passenger in a car for an hour without a break? Lying down in the afternoon when circumstances permit? Sitting and talking to someone? Sitting quietly after lunch without alcohol? In a car, while stopped for a few minutes in traffic?   Total = 17/ 24 points   FSS endorsed at 62/ 63 points.   Social History   Socioeconomic History  . Marital status: Married    Spouse name: Joe  . Number of children: 1  . Years of education: Not on file  . Highest education level: Not on file  Occupational History  . Occupation: Sport and exercise psychologist     Comment: [employer]  Tobacco Use  . Smoking status: Never Smoker  . Smokeless tobacco: Never Used  Substance and Sexual Activity  . Alcohol use: No    Alcohol/week: 50.0 standard drinks    Types: 50 Glasses of wine per week    Comment: quit ETOH on 2013  . Drug use: No    Types: Amphetamines    Comment: Ativan 4-'5mg'$  daily, no longer taking  . Sexual activity: Yes    Partners: Male    Birth control/protection: Pill    Comment: lives with husband and daughter, works at [employer], no dietary restrictions  Other Topics Concern  . Not  on file  Social History Narrative  . Not on file   Social Determinants of Health   Financial Resource Strain:   . Difficulty of Paying Living Expenses: Not on file  Food Insecurity:   . Worried About Charity fundraiser in the Last Year: Not on file  . Ran Out of Food in the Last Year: Not on file  Transportation Needs:   . Lack of Transportation (Medical): Not on file  . Lack of Transportation (Non-Medical): Not on file  Physical Activity:   . Days of Exercise per Week: Not on file  . Minutes of Exercise per Session: Not on file  Stress:   . Feeling of Stress : Not on file  Social Connections:   . Frequency of Communication with Friends and Family: Not on file  . Frequency of Social Gatherings with Friends and Family: Not on file  . Attends Religious Services: Not on file  . Active Member of Clubs or Organizations: Not on file  . Attends Archivist Meetings: Not on file  . Marital Status: Not on file    Family History  Problem Relation Age of Onset  . Kidney failure Father 54       deceased  . Kidney disease Father   . Obesity Father   . Seizures Father   . Heart disease Father 50  . Hypertension Father   . Hyperlipidemia Father   . Stroke Father   . Sleep apnea Father   . Schizophrenia Brother        70yo  . Alcohol abuse Brother   . Other Brother        chronic pain, 44 yo  . Stroke Brother     . Alcohol abuse Sister        22yo  . Obesity Paternal Grandfather   . Stroke Sister   . Depression Mother   . Anxiety disorder Mother   . Obesity Mother   . Colon cancer Neg Hx   . Colon polyps Neg Hx   . Esophageal cancer Neg Hx   . Rectal cancer Neg Hx   . Stomach cancer Neg Hx     Past Medical History:  Diagnosis Date  . Alcohol abuse, in remission   . Anger   . Anxiety   . Arthritis    feet   . Back pain   . Chest pain   . Depression   . Drug use   . Fatigue 02/15/2015  . GERD (gastroesophageal reflux disease)   . HTN (hypertension)   . Hyperlipidemia   . Obesity 08/10/2014  . Obesity   . Pain in both feet    arthritis / Hallox Rigidus  . Perimenopausal 11/18/2013  . Raynaud's disease   . Screening for cervical cancer 02/06/2015  . Sleep apnea    wears cpap   . SOB (shortness of breath)   . Vitamin D deficiency     Past Surgical History:  Procedure Laterality Date  . CESAREAN SECTION  2003  . TONSILLECTOMY  1980     Current Outpatient Medications on File Prior to Visit  Medication Sig Dispense Refill  . Armodafinil 250 MG tablet Take 1 tablet (250 mg total) by mouth daily. 90 tablet 1  . atorvastatin (LIPITOR) 20 MG tablet Take 1 tablet (20 mg total) by mouth daily. 90 tablet 1  . escitalopram (LEXAPRO) 20 MG tablet Take 1 tablet (20 mg total) by mouth daily. 90 tablet 1  . L-Tryptophan 500 MG  TABS Take by mouth.    . levocetirizine (XYZAL) 5 MG tablet Take 1 tablet (5 mg total) by mouth every evening. 90 tablet 1  . metoprolol succinate (TOPROL XL) 25 MG 24 hr tablet Take 1 tablet (25 mg total) by mouth daily. 90 tablet 0  . Naproxen-Esomeprazole (VIMOVO) 500-20 MG TBEC Take 1 tablet by mouth 2 (two) times daily as needed. 180 tablet 0  . neomycin-polymyxin-hydrocortisone (CORTISPORIN) OTIC solution Apply 1-2 drops daily to both toes after soaking 10 mL 0  . triamcinolone (NASACORT) 55 MCG/ACT AERO nasal inhaler Place 2 sprays into the nose daily. 32.4 mL  3  . valACYclovir (VALTREX) 1000 MG tablet Take 1 tablet (1,000 mg total) by mouth daily. 90 tablet 1   No current facility-administered medications on file prior to visit.   Physical exam:  Today's Vitals   02/28/19 1308  BP: 124/84  Pulse: 74  Temp: (!) 97.3 F (36.3 C)  Weight: 200 lb (90.7 kg)  Height: _0  (1.626 m)   Body mass index is 34.33 kg/m.   Wt Readings from Last 3 Encounters:  02/28/19 200 lb (90.7 kg)  02/05/19 202 lb (91.6 kg)  03/16/18 192 lb (87.1 kg)     Ht Readings from Last 3 Encounters:  02/28/19 _1  (1.626 m)  02/05/19 _2  (1.626 m)  02/19/18 _3  (1.626 m)      General: The patient is awake, alert and appears not in acute distress. The patient is well groomed. Head: Normocephalic, atraumatic. Neck is supple. Mallampati 3 ,  neck circumference:16 inches . Nasal airflow patent.  Retrognathia is not  seen.  Dental status: intact. Cardiovascular:  Regular rate and cardiac rhythm by pulse,  without distended neck veins. Respiratory: Lungs are clear to auscultation.  Skin:  Without evidence of ankle edema, or rash. Trunk: The patient's posture is erect.   Neurologic exam : The patient is awake and alert, oriented to place and time.   Memory subjective described as intact.  Attention span & concentration ability appears normal.  Speech is fluent,  without  dysarthria, dysphonia or aphasia.  Mood and affect are appropriate.   Cranial nerves: no loss of smell or taste reported  Pupils are equal and briskly reactive to light. Funduscopic exam deferred. .  Extraocular movements in vertical and horizontal planes were intact and without nystagmus.  No Diplopia. Visual fields by finger perimetry are intact. Hearing was intact to soft voice and finger rubbing.    Facial sensation intact to fine touch.  Facial motor strength is symmetric and tongue and uvula move midline.  Neck ROM : rotation, tilt and flexion extension were normal for age and  shoulder shrug was symmetrical.    Motor exam:  Symmetric bulk, tone and ROM.    Sensory:  Fine touch, pinprick and vibration were normal.   Coordination: Rapid alternating movements in the fingers/hands were of normal speed.   Gait and station: Patient could rise unassisted from a seated position, walked without assistive device.  Stance is of normal width/ base and the patient turned with 3 steps.  Toe and heel walk were deferred.  Deep tendon reflexes: in the  upper and lower extremities are symmetric and intact.  Babinski response was deferred.     After spending a total time of  40  minutes face to face and additional time for physical and neurologic examination, review of laboratory studies,  personal review of imaging studies, reports and results of other testing  and review of referral information / records as far as provided in visit, I have established the following assessments:  Mrs. Cea reports a reoccurrence of snoring in spite of using CPAP, a very high degree of daytime fatigue, and a high degree of daytime sleepiness.  This would be a pathological degree of sleepiness at 17 points on the Epworth Sleepiness Scale.  She is attributing some of these changes to the weight gain which has been significant and has catapulted her into the morbidly obese category.  The pandemic also has changed some of her work and home routines.  CPAP has been used also over the last couple of weeks not as compliantly however the residual AHI is 6.7/h and short improve.  What I am going to do is to use her current CPAP machine which happens to be  56 years old, and asked [employer] to increase the maximum pressure provided.  What I would like is a setting between 6 and 17 cmH2O was 2 cm EPR.   There is no history of a pulmonary disease and around 6 the patient will necessarily need as much expiratory relief it will also help to reduce the upper pressure setting to a lesser degree in its effectiveness.  In  addition she has reported having vivid dreams she has been sober for 8 years and I do not think that this is a protracted effect of alcohol abuse.  Alcohol abuse can cause several sleep disorders it can cause later onset memory and cognitive decline as well however given that the patient has not only been sober but had a rather recent resurgence of excessive daytime sleepiness I think this is not related.  I would like for her to undergo an HLA narcolepsy test following the vivid dream symptom endorsement.    My Plan is to proceed with:  1) HLA-  Today.  2) If HLA positive , follow with MSLT - PSG on CPAP- and weaning off Armodafinil or antidepressant - Lexapro. Has to be out of the system 2-3 weeks.   3) I have to reset CPAP  What I am going to do is to use her current CPAP machine which happens to be  56 years old, and asked [employer] to increase the maximum pressure provided.  What I would like is a setting between 6 and 17 cmH2O was 2 cm EPR.    I would like to thank Janith Lima, MD [place],  [place] [address] for allowing me to meet with and to take care of this pleasant patient.   In short, Cheryl Taylor is presenting with persistent and recurrent hypersomnia on CPAP following a weight gain-   I plan to follow up either personally or through our NP within 2 month.   CC: I will share my notes with PCP .  Electronically signed by: Larey Seat, MD 02/28/2019 1:18 PM  [facility] Board certified by The [employer] of Sleep Medicine and Diplomate of the [employer] of Sleep Medicine. Board certified In Neurology through the [place], Fellow of the [employer] of Neurology. Medical Director of [facility].

## 2019-02-28 NOTE — Patient Instructions (Addendum)
Hypersomnia Hypersomnia is a condition in which a person feels very tired during the day even though he or she gets plenty of sleep at night. A person with this condition may take naps during the day and may find it very difficult to wake up from sleep. Hypersomnia may affect a person's ability to think, concentrate, drive, or remember things. What are the causes? The cause of this condition may not be known. Possible causes include:  Certain medicines.  Sleep disorders, such as narcolepsy and sleep apnea.  Injury to the head, brain, or spinal cord.  Drug or alcohol use.  Gastroesophageal reflux disease (GERD).  Tumors.  Certain medical conditions, such as depression, diabetes, or an underactive thyroid gland (hypothyroidism). What are the signs or symptoms? The main symptoms of hypersomnia include:  Feeling very tired throughout the day, regardless of how much sleep you got the night before.  Having trouble waking up. Others may find it difficult to wake you up when you are sleeping.  Sleeping for longer and longer periods at a time.  Taking naps throughout the day. Other symptoms may include:  Feeling restless, anxious, or annoyed.  Lacking energy.  Having trouble with: ? Remembering. ? Speaking. ? Thinking.  Loss of appetite.  Seeing, hearing, tasting, smelling, or feeling things that are not real (hallucinations). How is this diagnosed? This condition may be diagnosed based on:  Your symptoms and medical history.  Your sleeping habits. Your health care provider may ask you to write down your sleeping habits in a daily sleep log, along with any symptoms you have.  A series of tests that are done while you sleep (sleep study or polysomnogram).  A test that measures how quickly you can fall asleep during the day (daytime nap study or multiple sleep latency test). How is this treated? Treatment can help you manage your condition. Treatment may  include:  Following a regular sleep routine.  Lifestyle changes, such as changing your eating habits, getting regular exercise, and avoiding alcohol or caffeinated beverages.  Taking medicines to make you more alert (stimulants) during the day.  Treating any underlying medical causes of hypersomnia. Follow these instructions at home: Sleep routine   Schedule the same bedtime and wake-up time each day.  Practice a relaxing bedtime routine. This may include reading, meditation, deep breathing, or taking a warm bath before going to sleep.  Get regular exercise each day. Avoid strenuous exercise in the evening hours.  Keep your sleep environment at a cooler temperature, darkened, and quiet.  Sleep with pillows and a mattress that are comfortable and supportive.  Schedule short 20-minute naps for when you feel sleepiest during the day.  Talk with your employer or teachers about your hypersomnia. If possible, adjust your schedule so that: ? You have a regular daytime work schedule. ? You can take a scheduled nap during the day. ? You do not have to work or be active at night.  Do not eat a heavy meal for a few hours before bedtime. Eat your meals at about the same times every day.  Avoid drinking alcohol or caffeinated beverages. Safety   Do not drive or use heavy machinery if you are sleepy. Ask your health care provider if it is safe for you to drive.  Wear a life jacket when swimming or spending time near water. General instructions  Take supplements and over-the-counter and prescription medicines only as told by your health care provider.  Keep a sleep log that will help   your doctor manage your condition. This may include information about: ? What time you go to bed each night. ? How often you wake up at night. ? How many hours you sleep at night. ? How often and for how long you nap during the day. ? Any observations from others, such as leg movements during sleep,  sleep walking, or snoring.  Keep all follow-up visits as told by your health care provider. This is important. Contact a health care provider if:  You have new symptoms.  Your symptoms get worse. Get help right away if:  You have serious thoughts about hurting yourself or someone else. If you ever feel like you may hurt yourself or others, or have thoughts about taking your own life, get help right away. You can go to your nearest emergency department or call:  Your local emergency services (911 in the [place]  A suicide crisis helpline, such as the [place] at 848-805-1572. This is open 24 hours a day. Summary  Hypersomnia refers to a condition in which you feel very tired during the day even though you get plenty of sleep at night.  A person with this condition may take naps during the day and may find it very difficult to wake up from sleep.  Hypersomnia may affect a person's ability to think, concentrate, drive, or remember things.  Treatment, such as following a regular sleep routine and making some lifestyle changes, can help you manage your condition. This information is not intended to replace advice given to you by your health care provider. Make sure you discuss any questions you have with your health care provider. Document Revised: 01/19/2017 Document Reviewed: 01/19/2017 Elsevier Patient Education  2020 Cheryl Taylor reports a reoccurrence of snoring in spite of using CPAP, a very high degree of daytime fatigue, and a high degree of daytime sleepiness.  This would be a pathological degree of sleepiness at 17 points on the Epworth Sleepiness Scale.  She is attributing some of these changes to the weight gain which has been significant and has catapulted her into the morbidly obese category.  The pandemic also has changed some of her work and home routines.  CPAP has been used also over the last couple of weeks not as compliantly  however the residual AHI is 6.7/h and short improve.  What I am going to do is to use her current CPAP machine which happens to be  56 years old, and asked [employer] to increase the maximum pressure provided.  What I would like is a setting between 6 and 17 cmH2O was 2 cm EPR.   There is no history of a pulmonary disease and around 6 the patient will necessarily need as much expiratory relief it will also help to reduce the upper pressure setting to a lesser degree in its effectiveness.  In addition she has reported having vivid dreams she has been sober for 8 years and I do not think that this is a protracted effect of alcohol abuse.  Alcohol abuse can cause several sleep disorders it can cause later onset memory and cognitive decline as well however given that the patient has not only been sober but had a rather recent resurgence of excessive daytime sleepiness I think this is not related.  I would like for her to undergo an HLA narcolepsy test following the vivid dream symptom endorsement.    My Plan is to proceed with:  1) HLA-  Today.  2)  If HLA positive , follow with MSLT - PSG on CPAP- and weaning off Armodafinil or antidepressant - Lexapro. Has to be out of the system 2-3 weeks.   3) I have to reset CPAP  What I am going to do is to use her current CPAP machine which happens to be  54 years old, and asked [employer] to increase the maximum pressure provided.  What I would like is a setting between 6 and 17 cmH2O was 2 cm EPR.

## 2019-03-07 LAB — NARCOLEPSY EVALUATION
DQA1*01:02: NEGATIVE
DQB1*06:02: NEGATIVE

## 2019-03-07 NOTE — Progress Notes (Signed)
HLA-Negative for narcolepsy allele

## 2019-03-08 ENCOUNTER — Encounter: Payer: Self-pay | Admitting: Gastroenterology

## 2019-03-11 ENCOUNTER — Encounter: Payer: Self-pay | Admitting: Neurology

## 2019-03-25 ENCOUNTER — Ambulatory Visit: Payer: Self-pay | Admitting: Women's Health

## 2019-04-08 ENCOUNTER — Other Ambulatory Visit: Payer: Self-pay

## 2019-04-08 ENCOUNTER — Ambulatory Visit (AMBULATORY_SURGERY_CENTER): Payer: Self-pay | Admitting: *Deleted

## 2019-04-08 VITALS — Temp 96.9°F | Ht 64.0 in | Wt 198.0 lb

## 2019-04-08 DIAGNOSIS — Z01818 Encounter for other preprocedural examination: Secondary | ICD-10-CM

## 2019-04-08 DIAGNOSIS — Z1211 Encounter for screening for malignant neoplasm of colon: Secondary | ICD-10-CM

## 2019-04-08 NOTE — Progress Notes (Signed)
Patient is here in-person for PV. Patient denies any allergies to eggs or soy. Patient denies any problems with anesthesia/sedation. Patient denies any oxygen use at home. Patient denies taking any diet/weight loss medications or blood thinners. Patient is not being treated for MRSA or C-diff. EMMI education assisgned to the patient for the procedure, this was explained and instructions given to patient. COVID-19 screening test is on 04/15/2019, the pt is aware.  Patient is aware of our care-partner policy and will wear a mask into building.    Patient states she has the Suprep at home from her last PV.

## 2019-04-10 ENCOUNTER — Other Ambulatory Visit: Payer: Self-pay | Admitting: Internal Medicine

## 2019-04-11 ENCOUNTER — Other Ambulatory Visit: Payer: Self-pay

## 2019-04-11 ENCOUNTER — Ambulatory Visit
Admission: RE | Admit: 2019-04-11 | Discharge: 2019-04-11 | Disposition: A | Discharge status: Home / Self Care | Payer: BC Managed Care – PPO | Source: Ambulatory Visit | Attending: Internal Medicine | Admitting: Internal Medicine

## 2019-04-11 DIAGNOSIS — Z1231 Encounter for screening mammogram for malignant neoplasm of breast: Secondary | ICD-10-CM | POA: Diagnosis not present

## 2019-04-12 ENCOUNTER — Other Ambulatory Visit: Payer: Self-pay | Admitting: Internal Medicine

## 2019-04-12 ENCOUNTER — Other Ambulatory Visit: Payer: Self-pay

## 2019-04-12 DIAGNOSIS — G4710 Hypersomnia, unspecified: Secondary | ICD-10-CM

## 2019-04-12 DIAGNOSIS — G4719 Other hypersomnia: Secondary | ICD-10-CM

## 2019-04-12 DIAGNOSIS — G4733 Obstructive sleep apnea (adult) (pediatric): Secondary | ICD-10-CM

## 2019-04-12 LAB — HM MAMMOGRAPHY

## 2019-04-12 MED ORDER — ARMODAFINIL 250 MG PO TABS: 250.0000 mg | ORAL_TABLET | Freq: Two times a day (BID) | ORAL | 0 refills | Status: DC

## 2019-04-15 ENCOUNTER — Telehealth: Payer: Self-pay

## 2019-04-15 ENCOUNTER — Encounter: Payer: Self-pay | Admitting: Women's Health

## 2019-04-15 ENCOUNTER — Other Ambulatory Visit: Payer: Self-pay | Admitting: Internal Medicine

## 2019-04-15 ENCOUNTER — Other Ambulatory Visit: Payer: Self-pay

## 2019-04-15 ENCOUNTER — Ambulatory Visit (INDEPENDENT_AMBULATORY_CARE_PROVIDER_SITE_OTHER): Payer: BC Managed Care – PPO

## 2019-04-15 ENCOUNTER — Ambulatory Visit: Payer: BC Managed Care – PPO | Admitting: Women's Health

## 2019-04-15 ENCOUNTER — Other Ambulatory Visit: Payer: Self-pay | Admitting: Gastroenterology

## 2019-04-15 VITALS — BP 124/80 | Ht 64.0 in | Wt 197.0 lb

## 2019-04-15 DIAGNOSIS — Z01419 Encounter for gynecological examination (general) (routine) without abnormal findings: Secondary | ICD-10-CM

## 2019-04-15 DIAGNOSIS — Z1159 Encounter for screening for other viral diseases: Secondary | ICD-10-CM

## 2019-04-15 DIAGNOSIS — G4710 Hypersomnia, unspecified: Secondary | ICD-10-CM

## 2019-04-15 DIAGNOSIS — Z13820 Encounter for screening for osteoporosis: Secondary | ICD-10-CM | POA: Diagnosis not present

## 2019-04-15 DIAGNOSIS — G4719 Other hypersomnia: Secondary | ICD-10-CM

## 2019-04-15 DIAGNOSIS — G4733 Obstructive sleep apnea (adult) (pediatric): Secondary | ICD-10-CM

## 2019-04-15 LAB — HM PAP SMEAR

## 2019-04-15 LAB — SARS CORONAVIRUS 2 (TAT 6-24 HRS): SARS Coronavirus 2: NEGATIVE

## 2019-04-15 MED ORDER — ARMODAFINIL 250 MG PO TABS: 250.0000 mg | ORAL_TABLET | Freq: Two times a day (BID) | ORAL | 0 refills | Status: DC

## 2019-04-15 MED ORDER — CEPHALEXIN 500 MG PO CAPS: 500.0000 mg | ORAL_CAPSULE | Freq: Four times a day (QID) | ORAL | 0 refills | Status: DC

## 2019-04-15 NOTE — Patient Instructions (Signed)
Vit D 2000 iu daily dexa Health Maintenance for Postmenopausal Women Menopause is a normal process in which your ability to get pregnant comes to an end. This process happens slowly over many months or years, usually between the ages of 33 and 45. Menopause is complete when you have missed your menstrual periods for 12 months. It is important to talk with your health care provider about some of the most common conditions that affect women after menopause (postmenopausal women). These include heart disease, cancer, and bone loss (osteoporosis). Adopting a healthy lifestyle and getting preventive care can help to promote your health and wellness. The actions you take can also lower your chances of developing some of these common conditions. What should I know about menopause? During menopause, you may get a number of symptoms, such as:  Hot flashes. These can be moderate or severe.  Night sweats.  Decrease in sex drive.  Mood swings.  Headaches.  Tiredness.  Irritability.  Memory problems.  Insomnia. Choosing to treat or not to treat these symptoms is a decision that you make with your health care provider. Do I need hormone replacement therapy?  Hormone replacement therapy is effective in treating symptoms that are caused by menopause, such as hot flashes and night sweats.  Hormone replacement carries certain risks, especially as you become older. If you are thinking about using estrogen or estrogen with progestin, discuss the benefits and risks with your health care provider. What is my risk for heart disease and stroke? The risk of heart disease, heart attack, and stroke increases as you age. One of the causes may be a change in the body's hormones during menopause. This can affect how your body uses dietary fats, triglycerides, and cholesterol. Heart attack and stroke are medical emergencies. There are many things that you can do to help prevent heart disease and stroke. Watch your  blood pressure  High blood pressure causes heart disease and increases the risk of stroke. This is more likely to develop in people who have high blood pressure readings, are of African descent, or are overweight.  Have your blood pressure checked: ? Every 3-5 years if you are 37-74 years of age. ? Every year if you are 43 years old or older. Eat a healthy diet   Eat a diet that includes plenty of vegetables, fruits, low-fat dairy products, and lean protein.  Do not eat a lot of foods that are high in solid fats, added sugars, or sodium. Get regular exercise Get regular exercise. This is one of the most important things you can do for your health. Most adults should:  Try to exercise for at least 150 minutes each week. The exercise should increase your heart rate and make you sweat (moderate-intensity exercise).  Try to do strengthening exercises at least twice each week. Do these in addition to the moderate-intensity exercise.  Spend less time sitting. Even light physical activity can be beneficial. Other tips  Work with your health care provider to achieve or maintain a healthy weight.  Do not use any products that contain nicotine or tobacco, such as cigarettes, e-cigarettes, and chewing tobacco. If you need help quitting, ask your health care provider.  Know your numbers. Ask your health care provider to check your cholesterol and your blood sugar (glucose). Continue to have your blood tested as directed by your health care provider. Do I need screening for cancer? Depending on your health history and family history, you may need to have cancer screening at  different stages of your life. This may include screening for:  Breast cancer.  Cervical cancer.  Lung cancer.  Colorectal cancer. What is my risk for osteoporosis? After menopause, you may be at increased risk for osteoporosis. Osteoporosis is a condition in which bone destruction happens more quickly than new bone  creation. To help prevent osteoporosis or the bone fractures that can happen because of osteoporosis, you may take the following actions:  If you are 41-13 years old, get at least 1,000 mg of calcium and at least 600 mg of vitamin D per day.  If you are older than age 16 but younger than age 67, get at least 1,200 mg of calcium and at least 600 mg of vitamin D per day.  If you are older than age 19, get at least 1,200 mg of calcium and at least 800 mg of vitamin D per day. Smoking and drinking excessive alcohol increase the risk of osteoporosis. Eat foods that are rich in calcium and vitamin D, and do weight-bearing exercises several times each week as directed by your health care provider. How does menopause affect my mental health? Depression may occur at any age, but it is more common as you become older. Common symptoms of depression include:  Low or sad mood.  Changes in sleep patterns.  Changes in appetite or eating patterns.  Feeling an overall lack of motivation or enjoyment of activities that you previously enjoyed.  Frequent crying spells. Talk with your health care provider if you think that you are experiencing depression. General instructions See your health care provider for regular wellness exams and vaccines. This may include:  Scheduling regular health, dental, and eye exams.  Getting and maintaining your vaccines. These include: ? Influenza vaccine. Get this vaccine each year before the flu season begins. ? Pneumonia vaccine. ? Shingles vaccine. ? Tetanus, diphtheria, and pertussis (Tdap) booster vaccine. Your health care provider may also recommend other immunizations. Tell your health care provider if you have ever been abused or do not feel safe at home. Summary  Menopause is a normal process in which your ability to get pregnant comes to an end.  This condition causes hot flashes, night sweats, decreased interest in sex, mood swings, headaches, or lack of  sleep.  Treatment for this condition may include hormone replacement therapy.  Take actions to keep yourself healthy, including exercising regularly, eating a healthy diet, watching your weight, and checking your blood pressure and blood sugar levels.  Get screened for cancer and depression. Make sure that you are up to date with all your vaccines. This information is not intended to replace advice given to you by your health care provider. Make sure you discuss any questions you have with your health care provider. Document Revised: 01/10/2018 Document Reviewed: 01/10/2018 Elsevier Patient Education  2020 [employer].

## 2019-04-15 NOTE — Addendum Note (Signed)
Addended by: Lorine Bears on: 04/15/2019 02:08 PM   Modules accepted: Orders

## 2019-04-15 NOTE — Progress Notes (Signed)
SADINA HAISTEN 06-20-1965 [id number]    History:    Presents for annual exam.  Postmenopausal on no HRT.Marland Kitchen  Normal Pap and mammogram history.  Scheduled colonoscopy this week.  Primary care manages hypertension, GERD, hypercholesteremia, HSV and Sleep apnea.  Past medical history, past surgical history, family history and social history were all reviewed and documented in the EPIC chart.  Works in Engineer, technical sales.  Graduated from [employer].  50 year old daughter doing well has had Gardasil.  ROS:  A ROS was performed and pertinent positives and negatives are included.  Exam:  Vitals:   04/15/19 1219  BP: 124/80  Weight: 197 lb (89.4 kg)  Height: 5\' 4"  (1.626 m)   Body mass index is 33.81 kg/m.   General appearance:  Normal Thyroid:  Symmetrical, normal in size, without palpable masses or nodularity. Respiratory  Auscultation:  Clear without wheezing or rhonchi Cardiovascular  Auscultation:  Regular rate, without rubs, murmurs or gallops  Edema/varicosities:  Not grossly evident Abdominal  Soft,nontender, without masses, guarding or rebound.  Liver/spleen:  No organomegaly noted  Hernia:  None appreciated  Skin  Inspection:  Grossly normal   Breasts: Examined lying and sitting.     Right: Without masses, retractions, discharge or axillary adenopathy.     Left: Without masses, retractions, discharge or axillary adenopathy. Gentitourinary   Inguinal/mons:  Normal without inguinal adenopathy  External genitalia:  Normal  BUS/Urethra/Skene's glands:  Normal  Vagina:  Normal  Cervix:  Normal stenotic  Uterus: Retroverted normal in size, shape and contour.  Midline and mobile  Adnexa/parametria:     Rt: Without masses or tenderness.   Lt: Without masses or tenderness.  Anus and perineum: Normal  Digital rectal exam: Normal sphincter tone without palpated masses or tenderness  Assessment/Plan:  54 y.o. MWF G1, P1 for annual exam with no complaints.  Postmenopausal no  HRT with no bleeding Hypertension, hypercholesteremia, sleep apnea, GERD, HSV-primary care manages labs and meds.   Ingrown toenail has had surgery right great toe inflamed  Plan: Keflex 500 4 times daily for 7 days prescription, proper use given and reviewed follow-up with podiatrist if continued inflammation, continue daily Epson salt soaks.  SBEs, continue annual 3D screening mammogram, calcium rich foods, vitamin D 2000 IUs daily.  Aware of importance of increasing exercise and decreasing calories/carbs.  Self-care, leisure activities encouraged.  Encouraged yoga.  Schedule DEXA.  Pap with HR HPV typing, Pap normal 2017 HPV typing not checked.Candiss Norse Jenniah Bhavsar WHNP, 1:20 PM 04/15/2019

## 2019-04-15 NOTE — Telephone Encounter (Signed)
Pt called and stated that the pharmacy needed to speak to [place] about patients armodafinil sig change.   Contacted [facility] and informed that the Armodafinil did change to 250 mg bid

## 2019-04-17 LAB — PAP, TP IMAGING W/ HPV RNA, RFLX HPV TYPE 16,18/45: HPV DNA High Risk: NOT DETECTED

## 2019-04-18 ENCOUNTER — Encounter: Payer: Self-pay | Admitting: Gastroenterology

## 2019-04-18 ENCOUNTER — Ambulatory Visit (AMBULATORY_SURGERY_CENTER): Payer: BC Managed Care – PPO | Admitting: Gastroenterology

## 2019-04-18 ENCOUNTER — Other Ambulatory Visit: Payer: Self-pay

## 2019-04-18 VITALS — BP 120/68 | HR 63 | Temp 97.3°F | Resp 16 | Ht 64.0 in | Wt 198.0 lb

## 2019-04-18 DIAGNOSIS — D125 Benign neoplasm of sigmoid colon: Secondary | ICD-10-CM | POA: Diagnosis not present

## 2019-04-18 DIAGNOSIS — Z1211 Encounter for screening for malignant neoplasm of colon: Secondary | ICD-10-CM

## 2019-04-18 LAB — HM COLONOSCOPY

## 2019-04-18 NOTE — Patient Instructions (Signed)
Handout on polyps & hemorrhoids given to you today  Await pathology results on polyp removed     YOU HAD AN ENDOSCOPIC PROCEDURE TODAY AT [facility]:   Refer to the procedure report that was given to you for any specific questions about what was found during the examination.  If the procedure report does not answer your questions, please call your gastroenterologist to clarify.  If you requested that your care partner not be given the details of your procedure findings, then the procedure report has been included in a sealed envelope for you to review at your convenience later.  YOU SHOULD EXPECT: Some feelings of bloating in the abdomen. Passage of more gas than usual.  Walking can help get rid of the air that was put into your GI tract during the procedure and reduce the bloating. If you had a lower endoscopy (such as a colonoscopy or flexible sigmoidoscopy) you may notice spotting of blood in your stool or on the toilet paper. If you underwent a bowel prep for your procedure, you may not have a normal bowel movement for a few days.  Please Note:  You might notice some irritation and congestion in your nose or some drainage.  This is from the oxygen used during your procedure.  There is no need for concern and it should clear up in a day or so.  SYMPTOMS TO REPORT IMMEDIATELY:   Following lower endoscopy (colonoscopy or flexible sigmoidoscopy):  Excessive amounts of blood in the stool  Significant tenderness or worsening of abdominal pains  Swelling of the abdomen that is new, acute  Fever of 100F or higher    For urgent or emergent issues, a gastroenterologist can be reached at any hour by calling [phone]. Do not use MyChart messaging for urgent concerns.    DIET:  We do recommend a small meal at first, but then you may proceed to your regular diet.  Drink plenty of fluids but you should avoid alcoholic beverages for 24 hours.  ACTIVITY:  You should plan  to take it easy for the rest of today and you should NOT DRIVE or use heavy machinery until tomorrow (because of the sedation medicines used during the test).    FOLLOW UP: Our staff will call the number listed on your records 48-72 hours following your procedure to check on you and address any questions or concerns that you may have regarding the information given to you following your procedure. If we do not reach you, we will leave a message.  We will attempt to reach you two times.  During this call, we will ask if you have developed any symptoms of COVID 19. If you develop any symptoms (ie: fever, flu-like symptoms, shortness of breath, cough etc.) before then, please call [phone].  If you test positive for Covid 19 in the 2 weeks post procedure, please call and report this information to [place].    If any biopsies were taken you will be contacted by phone or by letter within the next 1-3 weeks.  Please call us at [phone] if you have not heard about the biopsies in 3 weeks.    SIGNATURES/CONFIDENTIALITY: You and/or your care partner have signed paperwork which will be entered into your electronic medical record.  These signatures attest to the fact that that the information above on your After Visit Summary has been reviewed and is understood.  Full responsibility of the confidentiality of this discharge information lies with  you and/or your care-partner.

## 2019-04-18 NOTE — Progress Notes (Signed)
I have reviewed the patient's medical history in detail and updated the computerized patient record. Temp LC V/s cw

## 2019-04-18 NOTE — Progress Notes (Signed)
Report given to PACU, vss 

## 2019-04-18 NOTE — Op Note (Signed)
[facility] Patient Name: Cheryl Taylor Procedure Date: 04/18/2019 9:20 AM MRN: [id number] Endoscopist: Thornton Park MD, MD Age: 54 Referring MD:  Date of Birth: 09-05-1965 Gender: Female Account #: [id number] Procedure:                Colonoscopy Indications:              Screening for colorectal malignant neoplasm, This                            is the patient's first colonoscopy                           No known family history of colon cancer or polyps. Medicines:                Monitored Anesthesia Care Procedure:                Pre-Anesthesia Assessment:                           - Prior to the procedure, a History and Physical                            was performed, and patient medications and                            allergies were reviewed. The patient's tolerance of                            previous anesthesia was also reviewed. The risks                            and benefits of the procedure and the sedation                            options and risks were discussed with the patient.                            All questions were answered, and informed consent                            was obtained. Prior Anticoagulants: The patient has                            taken no previous anticoagulant or antiplatelet                            agents. ASA Grade Assessment: II - A patient with                            mild systemic disease. After reviewing the risks                            and benefits, the patient was deemed in  satisfactory condition to undergo the procedure.                           After obtaining informed consent, the colonoscope                            was passed under direct vision. Throughout the                            procedure, the patient's blood pressure, pulse, and                            oxygen saturations were monitored continuously. The                            Colonoscope was  introduced through the anus and                            advanced to the 3 cm into the ileum. A second                            forward view of the right colon was performed. The                            colonoscopy was performed with moderate difficulty                            due to a redundant colon, significant looping and a                            tortuous colon. Successful completion of the                            procedure was aided by applying abdominal pressure                            and repositioning the colonoscope. The quality of                            the bowel preparation was good. The terminal ileum,                            ileocecal valve, appendiceal orifice, and rectum                            were photographed. Scope In: 9:25:47 AM Scope Out: 9:42:34 AM Scope Withdrawal Time: 0 hours 11 minutes 37 seconds  Total Procedure Duration: 0 hours 16 minutes 47 seconds  Findings:                 The perianal and digital rectal examinations were                            normal.  A 6 mm polyp was found in the sigmoid colon. The                            polyp was semi-pedunculated. The polyp was removed                            with a cold snare. Resection and retrieval were                            complete. Estimated blood loss was minimal.                           Non-bleeding internal hemorrhoids were found. The                            hemorrhoids were small.                           The exam was otherwise without abnormality on                            direct and retroflexion views. Complications:            No immediate complications. Estimated blood loss:                            Minimal. Estimated Blood Loss:     Estimated blood loss was minimal. Impression:               - One 6 mm polyp in the sigmoid colon, removed with                            a cold snare. Resected and retrieved.                            - Non-bleeding internal hemorrhoids.                           - The examination was otherwise normal on direct                            and retroflexion views. Recommendation:           - Patient has a contact number available for                            emergencies. The signs and symptoms of potential                            delayed complications were discussed with the                            patient. Return to normal activities tomorrow.                            Written discharge instructions  were provided to the                            patient.                           - Resume previous diet.                           - Continue present medications.                           - Await pathology results.                           - Repeat colonoscopy date to be determined after                            pending pathology results are reviewed for                            surveillance.                           - Emerging evidence supports eating a diet of                            fruits, vegetables, grains, calcium, and yogurt                            while reducing red meat and alcohol may reduce the                            risk of colon cancer.                           - Thank you for allowing me to be involved in your                            colon cancer prevention. Thornton Park MD, MD 04/18/2019 9:49:23 AM This report has been signed electronically.

## 2019-04-18 NOTE — Progress Notes (Signed)
Called to room to assist during endoscopic procedure.  Patient ID and intended procedure confirmed with present staff. Received instructions for my participation in the procedure from the performing physician.  

## 2019-04-21 DIAGNOSIS — L03039 Cellulitis of unspecified toe: Secondary | ICD-10-CM | POA: Diagnosis not present

## 2019-04-22 ENCOUNTER — Telehealth: Payer: Self-pay

## 2019-04-22 ENCOUNTER — Telehealth: Payer: Self-pay | Admitting: *Deleted

## 2019-04-22 NOTE — Telephone Encounter (Signed)
No answer for post procedure call back. Left message for patient to call with questions or concerns. 

## 2019-04-22 NOTE — Telephone Encounter (Signed)
Attempted to reach pt. With follow-up call following endoscopic procedure 04/18/2019.  LM On pt. Voice mail.  Will try to reach pt. Again later today.

## 2019-04-25 ENCOUNTER — Encounter: Payer: Self-pay | Admitting: Gastroenterology

## 2019-05-03 ENCOUNTER — Encounter: Payer: Self-pay | Admitting: Internal Medicine

## 2019-05-03 DIAGNOSIS — I10 Essential (primary) hypertension: Secondary | ICD-10-CM

## 2019-05-06 ENCOUNTER — Ambulatory Visit: Payer: BC Managed Care – PPO | Attending: Internal Medicine

## 2019-05-06 DIAGNOSIS — Z23 Encounter for immunization: Secondary | ICD-10-CM

## 2019-05-06 MED ORDER — METOPROLOL SUCCINATE ER 25 MG PO TB24: 25.0000 mg | ORAL_TABLET | Freq: Every day | ORAL | 1 refills | Status: DC

## 2019-05-06 NOTE — Progress Notes (Signed)
   Covid-19 Vaccination Clinic  Name:  Cheryl Taylor    MRN: [id number] DOB: [birth year]  05/06/2019  [name] was observed post Covid-19 immunization for 15 minutes without incident. She was provided with Vaccine Information Sheet and instruction to access the V-Safe system.   [name] was instructed to call 911 with any severe reactions post vaccine: Marland Kitchen Difficulty breathing  . Swelling of face and throat  . A fast heartbeat  . A bad rash all over body  . Dizziness and weakness   Immunizations Administered    Name Date Dose VIS Date Route   Pfizer COVID-19 Vaccine 05/06/2019  2:31 PM 0.3 mL 01/11/2019 Intramuscular   Manufacturer: [place]   Lot: [id number]   [place]: [id number]

## 2019-05-15 ENCOUNTER — Ambulatory Visit: Payer: BC Managed Care – PPO | Admitting: Neurology

## 2019-05-21 ENCOUNTER — Other Ambulatory Visit: Payer: Self-pay | Admitting: Women's Health

## 2019-05-21 ENCOUNTER — Other Ambulatory Visit: Payer: Self-pay

## 2019-05-21 ENCOUNTER — Ambulatory Visit (INDEPENDENT_AMBULATORY_CARE_PROVIDER_SITE_OTHER): Payer: BC Managed Care – PPO

## 2019-05-21 DIAGNOSIS — Z780 Asymptomatic menopausal state: Secondary | ICD-10-CM

## 2019-05-21 DIAGNOSIS — Z13820 Encounter for screening for osteoporosis: Secondary | ICD-10-CM

## 2019-05-28 ENCOUNTER — Ambulatory Visit: Payer: BC Managed Care – PPO | Attending: Internal Medicine

## 2019-05-28 DIAGNOSIS — Z23 Encounter for immunization: Secondary | ICD-10-CM

## 2019-05-28 NOTE — Progress Notes (Signed)
   Covid-19 Vaccination Clinic  Name:  Cheryl Taylor    MRN: [id number] DOB: 06/14/1965  05/28/2019  Cheryl Taylor was observed post Covid-19 immunization for 15 minutes without incident. She was provided with Vaccine Information Sheet and instruction to access the V-Safe system.   Cheryl Taylor was instructed to call 911 with any severe reactions post vaccine: Marland Kitchen Difficulty breathing  . Swelling of face and throat  . A fast heartbeat  . A bad rash all over body  . Dizziness and weakness   Immunizations Administered    Name Date Dose VIS Date Route   Pfizer COVID-19 Vaccine 05/28/2019 10:41 AM 0.3 mL 03/27/2018 Intramuscular   Manufacturer: [place]   Lot: [id number]   [place]: [id number]

## 2019-06-04 ENCOUNTER — Ambulatory Visit: Payer: BC Managed Care – PPO | Admitting: Sports Medicine

## 2019-07-02 ENCOUNTER — Encounter: Payer: Self-pay | Admitting: Sports Medicine

## 2019-07-02 ENCOUNTER — Other Ambulatory Visit: Payer: Self-pay

## 2019-07-02 ENCOUNTER — Ambulatory Visit: Payer: BC Managed Care – PPO | Admitting: Sports Medicine

## 2019-07-02 DIAGNOSIS — Z9889 Other specified postprocedural states: Secondary | ICD-10-CM | POA: Diagnosis not present

## 2019-07-02 DIAGNOSIS — M79674 Pain in right toe(s): Secondary | ICD-10-CM | POA: Diagnosis not present

## 2019-07-02 DIAGNOSIS — M79675 Pain in left toe(s): Secondary | ICD-10-CM

## 2019-07-02 NOTE — Progress Notes (Signed)
Subjective: Cheryl Taylor is a 54 y.o. female patient returns to office today for follow up evaluation after having Right and Left Hallux lateral permanent nail avulsion performed on 02-21-19, patient reports that 2 months ago the right toe had some pain and PCP gave antibiotics but now doing better with no pain, redness, swelling, or drainage. Patient deniesfever/chills/nausea/vomitting/any other related constitutional symptoms at this time.  Patient Active Problem List   Diagnosis Date Noted  . Excessive body weight gain 02/28/2019  . Class 1 obesity due to excess calories without serious comorbidity with body mass index (BMI) of 34.0 to 34.9 in adult 02/28/2019  . Excessive daytime sleepiness 02/28/2019  . Vivid dream 02/28/2019  . Hypersomnia, persistent 02/28/2019  . OSA on CPAP 02/28/2019  . Excessive somnolence disorder 02/05/2019  . Visit for screening mammogram 02/05/2019  . Screen for colon cancer 02/05/2019  . Primary osteoarthritis involving multiple joints 11/06/2018  . Chronic neck pain 11/25/2017  . Strain of neck muscle 11/25/2017  . Insulin resistance 05/19/2016  . Class 1 obesity without serious comorbidity with body mass index (BMI) of 32.0 to 32.9 in adult 05/19/2016  . OSA (obstructive sleep apnea) 02/10/2015  . Screening for cervical cancer 02/06/2015  . Ovarian cyst, left 11/07/2012  . Hepatic hemangioma 11/07/2012  . Preventative health care 12/15/2011  . Raynaud's disease 12/15/2011  . Hyperlipidemia 12/15/2011  . 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 Vaginal discharge 12/19/2010  . Essential hypertension 06/07/2010  . Depression 04/20/2010  . Vitamin D deficiency 04/20/2010    Current Outpatient Medications on File Prior to Visit  Medication Sig Dispense Refill  . Armodafinil 250 MG tablet Take 1 tablet (250 mg total) by mouth 2 (two) times daily. 180 tablet 0  . atorvastatin (LIPITOR) 20 MG tablet Take 1 tablet (20 mg total) by  mouth daily. 90 tablet 1  . cephALEXin (KEFLEX) 500 MG capsule Take 1 capsule (500 mg total) by mouth 4 (four) times daily. 28 capsule 0  . escitalopram (LEXAPRO) 20 MG tablet Take 1 tablet (20 mg total) by mouth daily. 90 tablet 1  . L-Tryptophan 500 MG TABS Take by mouth.    . metoprolol succinate (TOPROL XL) 25 MG 24 hr tablet Take 1 tablet (25 mg total) by mouth daily. 90 tablet 1  . valACYclovir (VALTREX) 1000 MG tablet Take 1 tablet (1,000 mg total) by mouth daily. 90 tablet 1   No current facility-administered medications on file prior to visit.    No Known Allergies  Objective:  General: Well developed, nourished, in no acute distress, alert and oriented x3   Dermatology: Skin is warm, dry and supple bilateral. Right and left hallux lateral nail bed appears to be clean, dry,well healed, there is a small trauma line in the nails with some lifting of the lateral corners. (-) Erythema. (-) Edema. (-) serosanguous drainage present. The remaining nails appear unremarkable at this time. There are no other lesions or other signs of infection present.  Neurovascular status: Intact. No lower extremity swelling; No pain with calf compression bilateral.  Musculoskeletal: No tenderness bilateral hallux. Muscular strength within normal limits bilateral.   Assesement and Plan: Problem List Items Addressed This Visit    None    Visit Diagnoses    S/P nail surgery    -  Primary   Toe pain, bilateral          -Examined patient  -Trimmed left and right hallux nails using sterile  nail nipper without incident and advised patient that nails/trauma line will grow out -May apply antibiotic cream and Band-Aid dressing as needed for the next few days as needed. -Advised patient to closely monitor her toes and return to office if fails to continue to improve. -Patient is to return as needed or sooner if problems arise.  Landis Martins, DPM

## 2019-07-11 ENCOUNTER — Telehealth: Payer: Self-pay | Admitting: Internal Medicine

## 2019-07-11 DIAGNOSIS — I10 Essential (primary) hypertension: Secondary | ICD-10-CM

## 2019-07-11 NOTE — Telephone Encounter (Signed)
    [facility] requesting refill for metoprolol succinate (TOPROL XL)  The rep states patient taking 50mg

## 2019-07-16 MED ORDER — METOPROLOL SUCCINATE ER 25 MG PO TB24: 25.0000 mg | ORAL_TABLET | Freq: Every day | ORAL | 1 refills | Status: DC

## 2019-07-16 NOTE — Addendum Note (Signed)
Addended by: Karle Barr on: 07/16/2019 07:34 AM   Modules accepted: Orders

## 2019-07-18 ENCOUNTER — Other Ambulatory Visit: Payer: Self-pay | Admitting: Internal Medicine

## 2019-07-18 DIAGNOSIS — F418 Other specified anxiety disorders: Secondary | ICD-10-CM

## 2019-07-22 ENCOUNTER — Ambulatory Visit: Payer: BC Managed Care – PPO | Admitting: Internal Medicine

## 2019-07-29 ENCOUNTER — Ambulatory Visit: Payer: BC Managed Care – PPO | Admitting: Internal Medicine

## 2019-07-30 ENCOUNTER — Encounter: Payer: Self-pay | Admitting: Internal Medicine

## 2019-07-30 ENCOUNTER — Ambulatory Visit: Payer: BC Managed Care – PPO | Admitting: Internal Medicine

## 2019-07-30 ENCOUNTER — Other Ambulatory Visit: Payer: Self-pay

## 2019-07-30 VITALS — BP 158/92 | HR 75 | Temp 98.3°F | Resp 16 | Ht 64.0 in | Wt 192.0 lb

## 2019-07-30 DIAGNOSIS — I10 Essential (primary) hypertension: Secondary | ICD-10-CM | POA: Diagnosis not present

## 2019-07-30 DIAGNOSIS — E8881 Metabolic syndrome: Secondary | ICD-10-CM | POA: Diagnosis not present

## 2019-07-30 DIAGNOSIS — Z0000 Encounter for general adult medical examination without abnormal findings: Secondary | ICD-10-CM

## 2019-07-30 DIAGNOSIS — E559 Vitamin D deficiency, unspecified: Secondary | ICD-10-CM | POA: Diagnosis not present

## 2019-07-30 DIAGNOSIS — D539 Nutritional anemia, unspecified: Secondary | ICD-10-CM

## 2019-07-30 MED ORDER — IRBESARTAN 150 MG PO TABS: 150.0000 mg | ORAL_TABLET | Freq: Every day | ORAL | 0 refills | Status: DC

## 2019-07-30 MED ORDER — INDAPAMIDE 1.25 MG PO TABS: 1.2500 mg | ORAL_TABLET | Freq: Every day | ORAL | 0 refills | Status: DC

## 2019-07-30 NOTE — Progress Notes (Signed)
Subjective:  Patient ID: Cheryl Taylor, female    DOB: 30-Jun-1965  Age: 54 y.o. MRN: [id number]  CC: Annual Exam and Hypertension  This visit occurred during the SARS-CoV-2 public health emergency.  Safety protocols were in place, including screening questions prior to the visit, additional usage of staff PPE, and extensive cleaning of exam room while observing appropriate contact time as indicated for disinfecting solutions.    HPI Cheryl Taylor presents for a CPX.  She is concerned the beta-blocker is not controlling her blood pressure.  She has been using her CPAP machine and working on her lifestyle modifications.  She is active and denies any recent episodes of CP, DOE, palpitations, or edema.  She complains of chronic fatigue but that is getting better.  She denies any recent episodes of headache or blurred vision.  Outpatient Medications Prior to Visit  Medication Sig Dispense Refill  . Armodafinil 250 MG tablet Take 1 tablet (250 mg total) by mouth 2 (two) times daily. 180 tablet 0  . escitalopram (LEXAPRO) 20 MG tablet TAKE 1 TABLET BY MOUTH DAILY 90 tablet 1  . L-Tryptophan 500 MG TABS Take by mouth.    . valACYclovir (VALTREX) 1000 MG tablet Take 1 tablet (1,000 mg total) by mouth daily. 90 tablet 1  . metoprolol succinate (TOPROL XL) 25 MG 24 hr tablet Take 1 tablet (25 mg total) by mouth daily. 90 tablet 1  . atorvastatin (LIPITOR) 20 MG tablet Take 1 tablet (20 mg total) by mouth daily. 90 tablet 1  . cephALEXin (KEFLEX) 500 MG capsule Take 1 capsule (500 mg total) by mouth 4 (four) times daily. 28 capsule 0   No facility-administered medications prior to visit.    ROS Review of Systems  Constitutional: Positive for fatigue. Negative for appetite change, chills, diaphoresis and unexpected weight change.  HENT: Negative.   Eyes: Negative.   Respiratory: Negative for cough, shortness of breath and wheezing.   Cardiovascular: Negative for chest pain, palpitations  and leg swelling.  Gastrointestinal: Negative for abdominal pain, constipation, diarrhea, nausea and vomiting.  Endocrine: Negative.   Genitourinary: Negative.  Negative for difficulty urinating.  Musculoskeletal: Negative for arthralgias, joint swelling and myalgias.  Skin: Negative.  Negative for color change and pallor.  Neurological: Negative.  Negative for dizziness, weakness, light-headedness, numbness and headaches.  Hematological: Negative for adenopathy. Does not bruise/bleed easily.  Psychiatric/Behavioral: Negative.     Objective:  BP (!) 158/92 (BP Location: Left Arm, Patient Position: Sitting, Cuff Size: Large)   Pulse 75   Temp 98.3 F (36.8 C) (Oral)   Resp 16   Ht 5\' 4"  (1.626 m)   Wt 192 lb (87.1 kg)   LMP 11/13/2017   SpO2 97%   BMI 32.96 kg/m   BP Readings from Last 3 Encounters:  07/30/19 (!) 158/92  04/18/19 120/68  04/15/19 124/80    Wt Readings from Last 3 Encounters:  07/30/19 192 lb (87.1 kg)  04/18/19 198 lb (89.8 kg)  04/15/19 197 lb (89.4 kg)    Physical Exam Vitals reviewed.  Constitutional:      Appearance: Normal appearance.  HENT:     Nose: Nose normal.     Mouth/Throat:     Mouth: Mucous membranes are moist.  Eyes:     General: No scleral icterus.    Conjunctiva/sclera: Conjunctivae normal.  Cardiovascular:     Rate and Rhythm: Normal rate and regular rhythm.     Pulses: Normal pulses.  Heart sounds: No murmur heard.   Pulmonary:     Effort: Pulmonary effort is normal.     Breath sounds: No stridor. No wheezing, rhonchi or rales.  Abdominal:     General: Abdomen is flat.     Palpations: There is no mass.     Tenderness: There is no abdominal tenderness. There is no guarding.  Musculoskeletal:        General: Normal range of motion.     Cervical back: Neck supple.     Right lower leg: No edema.     Left lower leg: No edema.  Lymphadenopathy:     Cervical: No cervical adenopathy.  Skin:    General: Skin is warm and  dry.     Coloration: Skin is not pale.  Neurological:     General: No focal deficit present.     Mental Status: She is alert.  Psychiatric:        Mood and Affect: Mood normal.        Behavior: Behavior normal.     Lab Results  Component Value Date   WBC 8.8 07/30/2019   HGB 11.6 (L) 07/30/2019   HCT 35.2 (L) 07/30/2019   PLT 248.0 07/30/2019   GLUCOSE 82 07/30/2019   CHOL 234 (H) 07/30/2019   TRIG 216.0 (H) 07/30/2019   HDL 49.40 07/30/2019   LDLDIRECT 149.0 07/30/2019   LDLCALC 78 02/06/2018   ALT 19 07/30/2019   AST 18 07/30/2019   NA 133 (L) 07/30/2019   K 3.9 07/30/2019   CL 98 07/30/2019   CREATININE 0.58 07/30/2019   BUN 13 07/30/2019   CO2 27 07/30/2019   TSH 0.76 07/30/2019   HGBA1C 5.7 07/30/2019    MM 3D SCREEN BREAST BILATERAL  Result Date: 04/12/2019 CLINICAL DATA:  Screening. EXAM: DIGITAL SCREENING BILATERAL MAMMOGRAM WITH TOMO AND CAD COMPARISON:  Previous exam(s). ACR Breast Density Category b: There are scattered areas of fibroglandular density. FINDINGS: There are no findings suspicious for malignancy. Images were processed with CAD. IMPRESSION: No mammographic evidence of malignancy. A result letter of this screening mammogram will be mailed directly to the patient. RECOMMENDATION: Screening mammogram in one year. (Code:SM-B-01Y) BI-RADS CATEGORY  1: Negative. Electronically Signed   By: Franki Cabot M.D.   On: 04/12/2019 13:05    Assessment & Plan:   Cheryl Taylor was seen today for annual exam and hypertension.  Diagnoses and all orders for this visit:  Essential hypertension- Her blood pressure is not adequately well controlled.  I am concerned the beta-blocker may be contributing to the fatigue.  She agrees to taper off of metoprolol over the next few months.  Her labs are negative for secondary causes or endorgan damage.  I have asked her to start taking an ARB and a thiazide diuretic. -     CBC with Differential/Platelet; Future -     Basic  metabolic panel; Future -     Hepatic function panel; Future -     TSH; Future -     Urinalysis, Routine w reflex microscopic; Future -     VITAMIN D 25 Hydroxy (Vit-D Deficiency, Fractures); Future -     irbesartan (AVAPRO) 150 MG tablet; Take 1 tablet (150 mg total) by mouth daily. -     indapamide (LOZOL) 1.25 MG tablet; Take 1 tablet (1.25 mg total) by mouth daily. -     VITAMIN D 25 Hydroxy (Vit-D Deficiency, Fractures) -     TSH -     Hepatic  function panel -     Basic metabolic panel -     CBC with Differential/Platelet -     Urinalysis, Routine w reflex microscopic  Insulin resistance- There is no evidence of complications related to this. -     Basic metabolic panel; Future -     Hemoglobin A1c; Future -     Hemoglobin A1c -     Basic metabolic panel  Preventative health care- Exam completed, labs reviewed, vaccines reviewed and updated, cancer screenings are up-to-date, her ASCVD risk score is less than 15% so I did not recommend a statin for CV risk reduction, patient education was given. -     Lipid panel; Future -     Lipid panel  Vitamin D deficiency- Her vitamin D level is normal now. -     VITAMIN D 25 Hydroxy (Vit-D Deficiency, Fractures); Future -     VITAMIN D 25 Hydroxy (Vit-D Deficiency, Fractures)  Deficiency anemia- She has developed a normocytic, normochromic anemia.  I have asked her to return to be screened for vitamin deficiencies. -     CBC with Differential/Platelet; Future -     IBC panel; Future -     Vitamin B12; Future -     Reticulocytes; Future -     Vitamin B1; Future -     Folate; Future -     Ferritin; Future  Other orders -     LDL cholesterol, direct   I have discontinued Anderson Malta A. Holtzer's atorvastatin, cephALEXin, and metoprolol succinate. I am also having her start on irbesartan and indapamide. Additionally, I am having her maintain her L-Tryptophan, valACYclovir, Armodafinil, and escitalopram.  Meds ordered this encounter   Medications  . irbesartan (AVAPRO) 150 MG tablet    Sig: Take 1 tablet (150 mg total) by mouth daily.    Dispense:  90 tablet    Refill:  0  . indapamide (LOZOL) 1.25 MG tablet    Sig: Take 1 tablet (1.25 mg total) by mouth daily.    Dispense:  90 tablet    Refill:  0     Follow-up: Return in about 3 months (around 10/30/2019).  [name], MD

## 2019-07-30 NOTE — Patient Instructions (Signed)

## 2019-07-31 ENCOUNTER — Encounter: Payer: Self-pay | Admitting: Internal Medicine

## 2019-07-31 DIAGNOSIS — D539 Nutritional anemia, unspecified: Secondary | ICD-10-CM | POA: Insufficient documentation

## 2019-07-31 HISTORY — DX: Nutritional anemia, unspecified: D53.9

## 2019-07-31 LAB — CBC WITH DIFFERENTIAL/PLATELET
Basophils Absolute: 0.1 10*3/uL (ref 0.0–0.1)
Basophils Relative: 0.8 % (ref 0.0–3.0)
Eosinophils Absolute: 0.2 10*3/uL (ref 0.0–0.7)
Eosinophils Relative: 2.2 % (ref 0.0–5.0)
HCT: 35.2 % — ABNORMAL LOW (ref 36.0–46.0)
Hemoglobin: 11.6 g/dL — ABNORMAL LOW (ref 12.0–15.0)
Lymphocytes Relative: 25.9 % (ref 12.0–46.0)
Lymphs Abs: 2.3 10*3/uL (ref 0.7–4.0)
MCHC: 33.1 g/dL (ref 30.0–36.0)
MCV: 79.9 fl (ref 78.0–100.0)
Monocytes Absolute: 0.7 10*3/uL (ref 0.1–1.0)
Monocytes Relative: 8.1 % (ref 3.0–12.0)
Neutro Abs: 5.5 10*3/uL (ref 1.4–7.7)
Neutrophils Relative %: 63 % (ref 43.0–77.0)
Platelets: 248 10*3/uL (ref 150.0–400.0)
RBC: 4.4 Mil/uL (ref 3.87–5.11)
RDW: 15 % (ref 11.5–15.5)
WBC: 8.8 10*3/uL (ref 4.0–10.5)

## 2019-07-31 LAB — LIPID PANEL
Cholesterol: 234 mg/dL — ABNORMAL HIGH (ref 0–200)
HDL: 49.4 mg/dL (ref >39.00)
NonHDL: 184.57
Total CHOL/HDL Ratio: 5
Triglycerides: 216 mg/dL — ABNORMAL HIGH (ref 0.0–149.0)
VLDL: 43.2 mg/dL — ABNORMAL HIGH (ref 0.0–40.0)

## 2019-07-31 LAB — URINALYSIS, ROUTINE W REFLEX MICROSCOPIC
Bilirubin Urine: NEGATIVE
Ketones, ur: NEGATIVE
Leukocytes,Ua: NEGATIVE
Nitrite: NEGATIVE
Specific Gravity, Urine: 1.01 (ref 1.000–1.030)
Total Protein, Urine: NEGATIVE
Urine Glucose: NEGATIVE
Urobilinogen, UA: 0.2 (ref 0.0–1.0)
pH: 7.5 (ref 5.0–8.0)

## 2019-07-31 LAB — BASIC METABOLIC PANEL
BUN: 13 mg/dL (ref 6–23)
CO2: 27 mEq/L (ref 19–32)
Calcium: 9.6 mg/dL (ref 8.4–10.5)
Chloride: 98 mEq/L (ref 96–112)
Creatinine, Ser: 0.58 mg/dL (ref 0.40–1.20)
GFR: 107.36 mL/min (ref >60.00)
Glucose, Bld: 82 mg/dL (ref 70–99)
Potassium: 3.9 mEq/L (ref 3.5–5.1)
Sodium: 133 mEq/L — ABNORMAL LOW (ref 135–145)

## 2019-07-31 LAB — LDL CHOLESTEROL, DIRECT: Direct LDL: 149 mg/dL

## 2019-07-31 LAB — VITAMIN D 25 HYDROXY (VIT D DEFICIENCY, FRACTURES): VITD: 44.55 ng/mL (ref 30.00–100.00)

## 2019-07-31 LAB — HEPATIC FUNCTION PANEL
ALT: 19 U/L (ref 0–35)
AST: 18 U/L (ref 0–37)
Albumin: 4.6 g/dL (ref 3.5–5.2)
Alkaline Phosphatase: 101 U/L (ref 39–117)
Bilirubin, Direct: 0 mg/dL (ref 0.0–0.3)
Total Bilirubin: 0.3 mg/dL (ref 0.2–1.2)
Total Protein: 7.2 g/dL (ref 6.0–8.3)

## 2019-07-31 LAB — HEMOGLOBIN A1C: Hgb A1c MFr Bld: 5.7 % (ref 4.6–6.5)

## 2019-07-31 LAB — TSH: TSH: 0.76 u[IU]/mL (ref 0.35–4.50)

## 2019-07-31 NOTE — Telephone Encounter (Signed)
Updated the modifiers in Health Maintenance. Both cologuard and colonoscopy every 7 years was listed.

## 2019-08-01 ENCOUNTER — Other Ambulatory Visit (INDEPENDENT_AMBULATORY_CARE_PROVIDER_SITE_OTHER): Payer: BC Managed Care – PPO

## 2019-08-01 DIAGNOSIS — D539 Nutritional anemia, unspecified: Secondary | ICD-10-CM | POA: Diagnosis not present

## 2019-08-02 LAB — CBC WITH DIFFERENTIAL/PLATELET
Basophils Absolute: 0.1 10*3/uL (ref 0.0–0.1)
Basophils Relative: 0.9 % (ref 0.0–3.0)
Eosinophils Absolute: 0.2 10*3/uL (ref 0.0–0.7)
Eosinophils Relative: 2.1 % (ref 0.0–5.0)
HCT: 35.8 % — ABNORMAL LOW (ref 36.0–46.0)
Hemoglobin: 11.6 g/dL — ABNORMAL LOW (ref 12.0–15.0)
Lymphocytes Relative: 24.6 % (ref 12.0–46.0)
Lymphs Abs: 2.2 10*3/uL (ref 0.7–4.0)
MCHC: 32.5 g/dL (ref 30.0–36.0)
MCV: 80.9 fl (ref 78.0–100.0)
Monocytes Absolute: 0.6 10*3/uL (ref 0.1–1.0)
Monocytes Relative: 7.1 % (ref 3.0–12.0)
Neutro Abs: 5.8 10*3/uL (ref 1.4–7.7)
Neutrophils Relative %: 65.3 % (ref 43.0–77.0)
Platelets: 265 10*3/uL (ref 150.0–400.0)
RBC: 4.42 Mil/uL (ref 3.87–5.11)
RDW: 14.8 % (ref 11.5–15.5)
WBC: 8.8 10*3/uL (ref 4.0–10.5)

## 2019-08-02 LAB — FOLATE: Folate: 13.1 ng/mL (ref >5.9)

## 2019-08-02 LAB — IBC PANEL
Iron: 42 ug/dL (ref 42–145)
Saturation Ratios: 8.9 % — ABNORMAL LOW (ref 20.0–50.0)
Transferrin: 336 mg/dL (ref 212.0–360.0)

## 2019-08-02 LAB — FERRITIN: Ferritin: 9.9 ng/mL — ABNORMAL LOW (ref 10.0–291.0)

## 2019-08-02 LAB — VITAMIN B12: Vitamin B-12: 228 pg/mL (ref 211–911)

## 2019-08-03 ENCOUNTER — Other Ambulatory Visit: Payer: Self-pay | Admitting: Internal Medicine

## 2019-08-03 DIAGNOSIS — D508 Other iron deficiency anemias: Secondary | ICD-10-CM

## 2019-08-03 DIAGNOSIS — D513 Other dietary vitamin B12 deficiency anemia: Secondary | ICD-10-CM | POA: Insufficient documentation

## 2019-08-03 MED ORDER — CYANOCOBALAMIN 2000 MCG PO TABS: 2000.0000 ug | ORAL_TABLET | Freq: Every day | ORAL | 1 refills | Status: DC

## 2019-08-03 MED ORDER — FERRALET 90 90-1 MG PO TABS: 1.0000 | ORAL_TABLET | Freq: Every day | ORAL | 1 refills | Status: DC

## 2019-08-07 DIAGNOSIS — A6009 Herpesviral infection of other urogenital tract: Secondary | ICD-10-CM

## 2019-08-08 ENCOUNTER — Telehealth: Payer: Self-pay | Admitting: Internal Medicine

## 2019-08-08 ENCOUNTER — Other Ambulatory Visit: Payer: Self-pay | Admitting: Internal Medicine

## 2019-08-08 DIAGNOSIS — D508 Other iron deficiency anemias: Secondary | ICD-10-CM

## 2019-08-08 MED ORDER — VALACYCLOVIR HCL 1 G PO TABS: 1000.0000 mg | ORAL_TABLET | Freq: Every day | ORAL | 1 refills | Status: DC

## 2019-08-08 MED ORDER — FERROUS SULFATE 325 (65 FE) MG PO TABS: 325.0000 mg | ORAL_TABLET | Freq: Two times a day (BID) | ORAL | 1 refills | Status: DC

## 2019-08-08 NOTE — Telephone Encounter (Signed)
New message:   1.Medication Requested: B12 Fe Cbn-Fe Gluc-FA-B12-C-DSS (FERRALET 90) 90-1 MG TABS 2. Pharmacy (Name, [facility]): [id number] - [place], [address] 3. On Med List: yes  4. Last Visit with PCP: 07/30/19  5. Next visit date with PCP:   Pt states that the pharmacy no longer carries the [place]. Please advise.  Agent: Please be advised that RX refills may take up to 3 business days. We ask that you follow-up with your pharmacy.

## 2019-08-08 NOTE — Telephone Encounter (Signed)
The pharmacy is not carrying the [facility] any more.   Can this be changed to another iron supplement?   Please advise

## 2019-08-10 LAB — RETICULOCYTES
ABS Retic: 54120 cells/uL (ref 20000–8000)
Retic Ct Pct: 1.2 %

## 2019-08-10 LAB — VITAMIN B1, WHOLE BLOOD: Vitamin B1 (Thiamine), Blood: 159 nmol/L (ref 78–185)

## 2019-10-02 DIAGNOSIS — H6121 Impacted cerumen, right ear: Secondary | ICD-10-CM | POA: Diagnosis not present

## 2019-10-17 ENCOUNTER — Other Ambulatory Visit: Payer: Self-pay | Admitting: Internal Medicine

## 2019-10-17 DIAGNOSIS — F418 Other specified anxiety disorders: Secondary | ICD-10-CM

## 2019-10-17 DIAGNOSIS — I10 Essential (primary) hypertension: Secondary | ICD-10-CM

## 2019-10-23 DIAGNOSIS — Z23 Encounter for immunization: Secondary | ICD-10-CM | POA: Diagnosis not present

## 2019-10-26 ENCOUNTER — Other Ambulatory Visit: Payer: Self-pay | Admitting: Internal Medicine

## 2019-10-26 DIAGNOSIS — G4719 Other hypersomnia: Secondary | ICD-10-CM

## 2019-10-26 DIAGNOSIS — G4710 Hypersomnia, unspecified: Secondary | ICD-10-CM

## 2019-10-26 DIAGNOSIS — G4733 Obstructive sleep apnea (adult) (pediatric): Secondary | ICD-10-CM

## 2019-10-29 ENCOUNTER — Other Ambulatory Visit: Payer: Self-pay | Admitting: Internal Medicine

## 2019-10-29 ENCOUNTER — Encounter: Payer: Self-pay | Admitting: Internal Medicine

## 2019-10-29 DIAGNOSIS — G4710 Hypersomnia, unspecified: Secondary | ICD-10-CM

## 2019-10-29 DIAGNOSIS — G4733 Obstructive sleep apnea (adult) (pediatric): Secondary | ICD-10-CM

## 2019-10-29 DIAGNOSIS — G4719 Other hypersomnia: Secondary | ICD-10-CM

## 2019-10-29 DIAGNOSIS — H5712 Ocular pain, left eye: Secondary | ICD-10-CM | POA: Diagnosis not present

## 2019-11-28 ENCOUNTER — Encounter: Payer: Self-pay | Admitting: Internal Medicine

## 2019-12-18 DIAGNOSIS — H04123 Dry eye syndrome of bilateral lacrimal glands: Secondary | ICD-10-CM | POA: Diagnosis not present

## 2019-12-27 ENCOUNTER — Other Ambulatory Visit: Payer: Self-pay | Admitting: Internal Medicine

## 2019-12-27 DIAGNOSIS — F418 Other specified anxiety disorders: Secondary | ICD-10-CM

## 2020-01-11 ENCOUNTER — Other Ambulatory Visit: Payer: Self-pay | Admitting: Internal Medicine

## 2020-01-11 DIAGNOSIS — I10 Essential (primary) hypertension: Secondary | ICD-10-CM

## 2020-01-18 ENCOUNTER — Other Ambulatory Visit: Payer: Self-pay | Admitting: Internal Medicine

## 2020-01-18 DIAGNOSIS — D508 Other iron deficiency anemias: Secondary | ICD-10-CM

## 2020-01-23 ENCOUNTER — Other Ambulatory Visit: Payer: Self-pay | Admitting: Internal Medicine

## 2020-01-23 DIAGNOSIS — G4719 Other hypersomnia: Secondary | ICD-10-CM

## 2020-01-23 DIAGNOSIS — G4710 Hypersomnia, unspecified: Secondary | ICD-10-CM

## 2020-01-23 DIAGNOSIS — G4733 Obstructive sleep apnea (adult) (pediatric): Secondary | ICD-10-CM

## 2020-01-28 ENCOUNTER — Other Ambulatory Visit: Payer: Self-pay

## 2020-01-28 ENCOUNTER — Telehealth: Payer: Self-pay | Admitting: Internal Medicine

## 2020-01-28 ENCOUNTER — Encounter: Payer: Self-pay | Admitting: Internal Medicine

## 2020-01-28 ENCOUNTER — Ambulatory Visit: Payer: BC Managed Care – PPO | Admitting: Internal Medicine

## 2020-01-28 VITALS — BP 158/82 | HR 84 | Temp 98.7°F | Resp 16 | Ht 64.0 in | Wt 189.2 lb

## 2020-01-28 DIAGNOSIS — F3341 Major depressive disorder, recurrent, in partial remission: Secondary | ICD-10-CM | POA: Diagnosis not present

## 2020-01-28 DIAGNOSIS — D539 Nutritional anemia, unspecified: Secondary | ICD-10-CM

## 2020-01-28 DIAGNOSIS — G4710 Hypersomnia, unspecified: Secondary | ICD-10-CM | POA: Diagnosis not present

## 2020-01-28 DIAGNOSIS — G4719 Other hypersomnia: Secondary | ICD-10-CM

## 2020-01-28 DIAGNOSIS — I10 Essential (primary) hypertension: Secondary | ICD-10-CM | POA: Diagnosis not present

## 2020-01-28 DIAGNOSIS — E871 Hypo-osmolality and hyponatremia: Secondary | ICD-10-CM

## 2020-01-28 DIAGNOSIS — A6009 Herpesviral infection of other urogenital tract: Secondary | ICD-10-CM

## 2020-01-28 DIAGNOSIS — G4733 Obstructive sleep apnea (adult) (pediatric): Secondary | ICD-10-CM

## 2020-01-28 MED ORDER — IRBESARTAN 300 MG PO TABS
300.0000 mg | ORAL_TABLET | Freq: Every day | ORAL | 1 refills | Status: DC
Start: 2020-01-28 — End: 2020-01-31

## 2020-01-28 MED ORDER — ARMODAFINIL 250 MG PO TABS: 250.0000 mg | ORAL_TABLET | Freq: Every day | ORAL | 1 refills | Status: DC

## 2020-01-28 MED ORDER — NEBIVOLOL HCL 5 MG PO TABS
5.0000 mg | ORAL_TABLET | Freq: Every day | ORAL | 1 refills | Status: DC
Start: 2020-01-28 — End: 2020-08-27

## 2020-01-28 MED ORDER — BUPROPION HCL ER (XL) 150 MG PO TB24: 150.0000 mg | ORAL_TABLET | Freq: Every day | ORAL | 1 refills | Status: DC

## 2020-01-28 NOTE — Patient Instructions (Signed)

## 2020-01-28 NOTE — Telephone Encounter (Signed)
   Pharmacy requesting refill for valACYclovir (VALTREX) 1000 MG tablet [facility] - [place], [place] - [address]

## 2020-01-28 NOTE — Progress Notes (Addendum)
Subjective:  Patient ID: Cheryl Taylor, female    DOB: November 09, 1965  Age: 54 y.o. MRN: [id number]  CC: Hypertension and Depression  This visit occurred during the SARS-CoV-2 public health emergency.  Safety protocols were in place, including screening questions prior to the visit, additional usage of staff PPE, and extensive cleaning of exam room while observing appropriate contact time as indicated for disinfecting solutions.    HPI YAHIR SOLORSANO presents for f/up - She complains of muscle cramps that occur at rest.  She denies claudication.  She tells me that her blood pressure has not been very well controlled.  She is active and denies any recent episodes of headache, blurred vision, chest pain, shortness of breath, palpitations, edema, or fatigue.  She takes Prevacid for GERD.  She tells me she is not been taking indapamide because when she took it she felt like her eyes dried out.  She tells me she is compliant with the current dose of irbesartan.  She complains of low libido and anhedonia.  Outpatient Medications Prior to Visit  Medication Sig Dispense Refill  . cyanocobalamin 2000 MCG tablet Take 1 tablet (2,000 mcg total) by mouth daily. 90 tablet 1  . ferrous sulfate 325 (65 FE) MG tablet TAKE 1 TABLET (325 MG TOTAL) BY MOUTH 2 (TWO) TIMES DAILY WITH A MEAL. 180 tablet 1  . L-Tryptophan 500 MG TABS Take by mouth.    . Armodafinil 250 MG tablet TAKE 1 TABLET BY MOUTH EVERY DAY 90 tablet 1  . escitalopram (LEXAPRO) 20 MG tablet TAKE 1 TABLET BY MOUTH DAILY 90 tablet 1  . indapamide (LOZOL) 1.25 MG tablet TAKE 1 TABLET (1.25 MG TOTAL) BY MOUTH DAILY. 90 tablet 0  . irbesartan (AVAPRO) 150 MG tablet TAKE 1 TABLET BY MOUTH EVERY DAY 90 tablet 0  . valACYclovir (VALTREX) 1000 MG tablet Take 1 tablet (1,000 mg total) by mouth daily. 90 tablet 1   No facility-administered medications prior to visit.    ROS Review of Systems  Constitutional: Positive for fatigue. Negative for  appetite change, chills, diaphoresis and fever.  HENT: Negative.  Negative for trouble swallowing.   Eyes: Negative for visual disturbance.  Respiratory: Negative for cough, chest tightness, shortness of breath and wheezing.   Cardiovascular: Negative for chest pain, palpitations and leg swelling.  Gastrointestinal: Negative for abdominal pain, constipation, diarrhea, nausea and vomiting.  Endocrine: Negative.  Negative for polydipsia, polyphagia and polyuria.  Genitourinary: Negative.  Negative for decreased urine volume, difficulty urinating, dysuria, hematuria and urgency.  Musculoskeletal: Positive for myalgias. Negative for arthralgias and joint swelling.  Skin: Negative.  Negative for color change and rash.  Neurological: Negative.  Negative for dizziness, seizures, speech difficulty, weakness, light-headedness, numbness and headaches.  Hematological: Negative for adenopathy. Does not bruise/bleed easily.  Psychiatric/Behavioral: Positive for dysphoric mood. Negative for agitation, behavioral problems, decreased concentration, sleep disturbance and suicidal ideas. The patient is not nervous/anxious.     Objective:  BP (!) 158/82 (BP Location: Left Arm, Patient Position: Sitting, Cuff Size: Large)   Pulse 84   Temp 98.7 F (37.1 C) (Oral)   Resp 16   Ht 5\' 4"  (1.626 m)   Wt 189 lb 4 oz (85.8 kg)   LMP 11/13/2017   SpO2 98%   BMI 32.48 kg/m   BP Readings from Last 3 Encounters:  02/17/20 (!) 244/112  01/28/20 (!) 158/82  07/30/19 (!) 158/92    Wt Readings from Last 3 Encounters:  01/28/20 189 lb  4 oz (85.8 kg)  07/30/19 192 lb (87.1 kg)  04/18/19 198 lb (89.8 kg)    Physical Exam Vitals reviewed.  Constitutional:      Appearance: Normal appearance.  HENT:     Nose: Nose normal.     Mouth/Throat:     Mouth: Mucous membranes are moist.  Eyes:     General: No scleral icterus.    Conjunctiva/sclera: Conjunctivae normal.  Cardiovascular:     Rate and Rhythm: Normal  rate and regular rhythm.     Pulses: Normal pulses.     Heart sounds: Murmur heard.   Systolic murmur is present with a grade of 1/6.  No diastolic murmur is present. No gallop.   Pulmonary:     Effort: Pulmonary effort is normal.     Breath sounds: No stridor. No wheezing, rhonchi or rales.  Abdominal:     General: Abdomen is flat.     Palpations: There is no mass.     Tenderness: There is no abdominal tenderness. There is no guarding.  Musculoskeletal:        General: Normal range of motion.     Cervical back: Neck supple.     Right lower leg: No edema.     Left lower leg: No edema.  Lymphadenopathy:     Cervical: No cervical adenopathy.  Skin:    General: Skin is warm and dry.     Coloration: Skin is not pale.     Findings: No erythema or rash.  Neurological:     General: No focal deficit present.     Mental Status: She is alert and oriented to person, place, and time. Mental status is at baseline.  Psychiatric:        Mood and Affect: Mood normal.        Behavior: Behavior normal.        Thought Content: Thought content normal.        Judgment: Judgment normal.     Lab Results  Component Value Date   WBC 8.6 02/17/2020   HGB 12.8 02/17/2020   HCT 39.6 02/17/2020   PLT 274 02/17/2020   GLUCOSE 128 (H) 02/17/2020   CHOL 234 (H) 07/30/2019   TRIG 216.0 (H) 07/30/2019   HDL 49.40 07/30/2019   LDLDIRECT 149.0 07/30/2019   LDLCALC 78 02/06/2018   ALT 19 07/30/2019   AST 18 07/30/2019   NA 140 02/17/2020   K 3.4 (L) 02/17/2020   CL 107 02/17/2020   CREATININE 0.74 02/17/2020   BUN 15 02/17/2020   CO2 22 02/17/2020   TSH 0.76 07/30/2019   HGBA1C 5.7 07/30/2019    MM 3D SCREEN BREAST BILATERAL  Result Date: 04/12/2019 CLINICAL DATA:  Screening. EXAM: DIGITAL SCREENING BILATERAL MAMMOGRAM WITH TOMO AND CAD COMPARISON:  Previous exam(s). ACR Breast Density Category b: There are scattered areas of fibroglandular density. FINDINGS: There are no findings suspicious  for malignancy. Images were processed with CAD. IMPRESSION: No mammographic evidence of malignancy. A result letter of this screening mammogram will be mailed directly to the patient. RECOMMENDATION: Screening mammogram in one year. (Code:SM-B-01Y) BI-RADS CATEGORY  1: Negative. Electronically Signed   By: Bary Richard M.D.   On: 04/12/2019 13:05    Assessment & Plan:   Letzy was seen today for hypertension and depression.  Diagnoses and all orders for this visit:  Essential hypertension- Her blood pressure is not adequately well controlled.  She has developed hyponatremia and possibly SIADH so I have asked her to  stop taking the ARB.  I will screen her for adrenal excess and aldosterone excess.  Will try to control the blood pressure with nebivolol. -     Basic metabolic panel; Future -     Discontinue: irbesartan (AVAPRO) 300 MG tablet; Take 1 tablet (300 mg total) by mouth daily. -     nebivolol (BYSTOLIC) 5 MG tablet; Take 1 tablet (5 mg total) by mouth daily. -     Basic metabolic panel -     Cortisol; Future -     Aldosterone + renin activity w/ ratio; Future -     Basic metabolic panel; Future  Deficiency anemia- She remains anemic.  I will screen her for vitamin deficiencies and for hemolysis. -     CBC with Differential/Platelet; Future -     Vitamin B12; Future -     Iron; Future -     Folate; Future -     Ferritin; Future -     Ferritin -     Folate -     Iron -     Vitamin B12 -     CBC with Differential/Platelet -     Vitamin B1; Future -     Reticulocytes; Future -     Haptoglobin; Future -     Lactate dehydrogenase; Future  Excessive somnolence disorder- She has developed hyponatremia and possibly SIADH so I have asked her to discontinue armodafinil.  We will start bupropion.  If she needs to be on an agent for sleep apnea syndrome then will consider [place]. -     buPROPion (WELLBUTRIN XL) 150 MG 24 hr tablet; Take 1 tablet (150 mg total) by mouth daily. -      Discontinue: Armodafinil 250 MG tablet; Take 1 tablet (250 mg total) by mouth daily.  Recurrent major depressive disorder, in partial remission (HCC) -     buPROPion (WELLBUTRIN XL) 150 MG 24 hr tablet; Take 1 tablet (150 mg total) by mouth daily.  Excessive daytime sleepiness -     Discontinue: Armodafinil 250 MG tablet; Take 1 tablet (250 mg total) by mouth daily.  OSA (obstructive sleep apnea) -     Discontinue: Armodafinil 250 MG tablet; Take 1 tablet (250 mg total) by mouth daily.  Herpes genitalis in women -     valACYclovir (VALTREX) 1000 MG tablet; Take 1 tablet (1,000 mg total) by mouth daily.  Hyponatremia- She has developed symptomatic hyponatremia.  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  She will return to have her sodium level rechecked and I have ordered other labs to screen for secondary causes of hyponatremia. -     Sodium, urine, random; Future -     Cortisol; Future -     Aldosterone + renin activity w/ ratio; Future -     Basic metabolic panel; Future   I have discontinued Victorino Dike A. Quinlivan's indapamide, irbesartan, Armodafinil, escitalopram, irbesartan, and Armodafinil. I am also having her start on nebivolol and buPROPion. Additionally, I am having her maintain her L-Tryptophan, cyanocobalamin, ferrous sulfate, and valACYclovir.  Meds ordered this encounter  Medications  . DISCONTD: irbesartan (AVAPRO) 300 MG tablet    Sig: Take 1 tablet (300 mg total) by mouth daily.    Dispense:  90 tablet    Refill:  1  . nebivolol (BYSTOLIC) 5 MG tablet  Sig: Take 1 tablet (5 mg total) by mouth daily.    Dispense:  90 tablet    Refill:  1  . buPROPion (WELLBUTRIN XL) 150 MG 24 hr tablet    Sig: Take 1 tablet (150 mg total) by mouth daily.    Dispense:  90 tablet    Refill:  1  . DISCONTD: Armodafinil 250 MG tablet    Sig: Take 1  tablet (250 mg total) by mouth daily.    Dispense:  90 tablet    Refill:  1    This request is for a new prescription for a controlled substance as required by Federal/State law.  . valACYclovir (VALTREX) 1000 MG tablet    Sig: Take 1 tablet (1,000 mg total) by mouth daily.    Dispense:  90 tablet    Refill:  1   I spent 50 minutes in preparing to see the patient by review of recent labs, imaging and procedures, obtaining and reviewing separately obtained history, communicating with the patient and family or caregiver, ordering medications, tests or procedures, and documenting clinical information in the EHR including the differential Dx, treatment, and any further evaluation and other management of 1. Essential hypertension 2. Deficiency anemia 3. Excessive somnolence disorder 4. Recurrent major depressive disorder, in partial remission ([place]) 5. Excessive daytime sleepiness 6. OSA (obstructive sleep apnea) 7. Herpes genitalis in women 8. Hyponatremia     Follow-up: Return in about 3 months (around 04/27/2020).  [name], MD

## 2020-01-29 ENCOUNTER — Encounter: Payer: Self-pay | Admitting: Internal Medicine

## 2020-01-29 ENCOUNTER — Other Ambulatory Visit: Payer: Self-pay | Admitting: Internal Medicine

## 2020-01-29 LAB — CBC WITH DIFFERENTIAL/PLATELET
Basophils Absolute: 0.1 10*3/uL (ref 0.0–0.1)
Basophils Relative: 0.9 % (ref 0.0–3.0)
Eosinophils Absolute: 0.1 10*3/uL (ref 0.0–0.7)
Eosinophils Relative: 1.3 % (ref 0.0–5.0)
HCT: 35.3 % — ABNORMAL LOW (ref 36.0–46.0)
Hemoglobin: 11.8 g/dL — ABNORMAL LOW (ref 12.0–15.0)
Lymphocytes Relative: 27.4 % (ref 12.0–46.0)
Lymphs Abs: 1.8 10*3/uL (ref 0.7–4.0)
MCHC: 33.5 g/dL (ref 30.0–36.0)
MCV: 86.6 fl (ref 78.0–100.0)
Monocytes Absolute: 0.5 10*3/uL (ref 0.1–1.0)
Monocytes Relative: 8 % (ref 3.0–12.0)
Neutro Abs: 4.2 10*3/uL (ref 1.4–7.7)
Neutrophils Relative %: 62.4 % (ref 43.0–77.0)
Platelets: 261 10*3/uL (ref 150.0–400.0)
RBC: 4.07 Mil/uL (ref 3.87–5.11)
RDW: 13.1 % (ref 11.5–15.5)
WBC: 6.7 10*3/uL (ref 4.0–10.5)

## 2020-01-29 LAB — VITAMIN B12: Vitamin B-12: 483 pg/mL (ref 211–911)

## 2020-01-29 LAB — FERRITIN: Ferritin: 34.6 ng/mL (ref 10.0–291.0)

## 2020-01-29 LAB — BASIC METABOLIC PANEL
BUN: 14 mg/dL (ref 6–23)
CO2: 26 mEq/L (ref 19–32)
Calcium: 8.9 mg/dL (ref 8.4–10.5)
Chloride: 92 mEq/L — ABNORMAL LOW (ref 96–112)
Creatinine, Ser: 0.55 mg/dL (ref 0.40–1.20)
GFR: 102.08 mL/min (ref >60.00)
Glucose, Bld: 89 mg/dL (ref 70–99)
Potassium: 3.7 mEq/L (ref 3.5–5.1)
Sodium: 126 mEq/L — ABNORMAL LOW (ref 135–145)

## 2020-01-29 LAB — FOLATE: Folate: 11.6 ng/mL (ref >5.9)

## 2020-01-29 LAB — IRON: Iron: 96 ug/dL (ref 42–145)

## 2020-01-29 MED ORDER — VALACYCLOVIR HCL 1 G PO TABS: 1000.0000 mg | ORAL_TABLET | Freq: Every day | ORAL | 1 refills | Status: DC

## 2020-02-01 ENCOUNTER — Encounter (INDEPENDENT_AMBULATORY_CARE_PROVIDER_SITE_OTHER): Payer: Self-pay

## 2020-02-03 ENCOUNTER — Other Ambulatory Visit (INDEPENDENT_AMBULATORY_CARE_PROVIDER_SITE_OTHER): Payer: BC Managed Care – PPO

## 2020-02-03 DIAGNOSIS — I10 Essential (primary) hypertension: Secondary | ICD-10-CM | POA: Diagnosis not present

## 2020-02-03 DIAGNOSIS — E871 Hypo-osmolality and hyponatremia: Secondary | ICD-10-CM

## 2020-02-03 DIAGNOSIS — D539 Nutritional anemia, unspecified: Secondary | ICD-10-CM

## 2020-02-03 LAB — BASIC METABOLIC PANEL
BUN: 14 mg/dL (ref 6–23)
CO2: 25 mEq/L (ref 19–32)
Calcium: 8.9 mg/dL (ref 8.4–10.5)
Chloride: 95 mEq/L — ABNORMAL LOW (ref 96–112)
Creatinine, Ser: 0.59 mg/dL (ref 0.40–1.20)
GFR: 100.36 mL/min (ref >60.00)
Glucose, Bld: 99 mg/dL (ref 70–99)
Potassium: 3.5 mEq/L (ref 3.5–5.1)
Sodium: 128 mEq/L — ABNORMAL LOW (ref 135–145)

## 2020-02-03 LAB — CORTISOL: Cortisol, Plasma: 20.2 ug/dL

## 2020-02-08 LAB — RETICULOCYTES
ABS Retic: 51350 cells/uL (ref 20000–8000)
Retic Ct Pct: 1.3 %

## 2020-02-08 LAB — HAPTOGLOBIN: Haptoglobin: 114 mg/dL (ref 43–212)

## 2020-02-08 LAB — VITAMIN B1: Vitamin B1 (Thiamine): 17 nmol/L (ref 8–30)

## 2020-02-08 LAB — ALDOSTERONE + RENIN ACTIVITY W/ RATIO
ALDO / PRA Ratio: 19.5 Ratio (ref 0.9–28.9)
Aldosterone: 17 ng/dL
Renin Activity: 0.87 ng/mL/h (ref 0.25–5.82)

## 2020-02-08 LAB — SODIUM, URINE, RANDOM: Sodium, Ur: 136 mmol/L (ref 28–272)

## 2020-02-08 LAB — LACTATE DEHYDROGENASE: LDH: 144 U/L (ref 120–250)

## 2020-02-10 ENCOUNTER — Encounter: Payer: Self-pay | Admitting: Internal Medicine

## 2020-02-17 ENCOUNTER — Other Ambulatory Visit: Payer: Self-pay

## 2020-02-17 ENCOUNTER — Emergency Department (HOSPITAL_COMMUNITY)
Admission: EM | Admit: 2020-02-17 | Discharge: 2020-02-18 | Disposition: A | Discharge status: Home / Self Care | Payer: BC Managed Care – PPO | Attending: Emergency Medicine | Admitting: Emergency Medicine

## 2020-02-17 ENCOUNTER — Encounter (HOSPITAL_COMMUNITY): Payer: Self-pay

## 2020-02-17 DIAGNOSIS — Z5321 Procedure and treatment not carried out due to patient leaving prior to being seen by health care provider: Secondary | ICD-10-CM | POA: Diagnosis not present

## 2020-02-17 DIAGNOSIS — R519 Headache, unspecified: Secondary | ICD-10-CM | POA: Insufficient documentation

## 2020-02-17 DIAGNOSIS — I161 Hypertensive emergency: Secondary | ICD-10-CM | POA: Insufficient documentation

## 2020-02-17 LAB — CBC
HCT: 39.6 % (ref 36.0–46.0)
Hemoglobin: 12.8 g/dL (ref 12.0–15.0)
MCH: 28.7 pg (ref 26.0–34.0)
MCHC: 32.3 g/dL (ref 30.0–36.0)
MCV: 88.8 fL (ref 80.0–100.0)
Platelets: 274 10*3/uL (ref 150–400)
RBC: 4.46 MIL/uL (ref 3.87–5.11)
RDW: 12.1 % (ref 11.5–15.5)
WBC: 8.6 10*3/uL (ref 4.0–10.5)
nRBC: 0 % (ref 0.0–0.2)

## 2020-02-17 LAB — URINALYSIS, ROUTINE W REFLEX MICROSCOPIC
Bilirubin Urine: NEGATIVE
Glucose, UA: NEGATIVE mg/dL
Ketones, ur: NEGATIVE mg/dL
Leukocytes,Ua: NEGATIVE
Nitrite: NEGATIVE
Protein, ur: NEGATIVE mg/dL
Specific Gravity, Urine: 1.003 — ABNORMAL LOW (ref 1.005–1.030)
pH: 7 (ref 5.0–8.0)

## 2020-02-17 LAB — BASIC METABOLIC PANEL
Anion gap: 11 (ref 5–15)
BUN: 15 mg/dL (ref 6–20)
CO2: 22 mmol/L (ref 22–32)
Calcium: 9.6 mg/dL (ref 8.9–10.3)
Chloride: 107 mmol/L (ref 98–111)
Creatinine, Ser: 0.74 mg/dL (ref 0.44–1.00)
GFR, Estimated: >60 mL/min (ref >60)
Glucose, Bld: 128 mg/dL — ABNORMAL HIGH (ref 70–99)
Potassium: 3.4 mmol/L — ABNORMAL LOW (ref 3.5–5.1)
Sodium: 140 mmol/L (ref 135–145)

## 2020-02-17 LAB — I-STAT BETA HCG BLOOD, ED (MC, WL, AP ONLY): I-stat hCG, quantitative: 7.7 m[IU]/mL — ABNORMAL HIGH (ref <5)

## 2020-02-17 NOTE — ED Triage Notes (Signed)
Pt reports that today she felt like she was going to pass out and she check her BP and it was high, pt reports that she has not taken her BP meds in two weeks and thinks this is what her PCP told her to do due to her NA levels being low. C/o of HA

## 2020-02-18 ENCOUNTER — Encounter: Payer: Self-pay | Admitting: Internal Medicine

## 2020-02-18 ENCOUNTER — Other Ambulatory Visit: Payer: Self-pay | Admitting: Internal Medicine

## 2020-02-18 DIAGNOSIS — E876 Hypokalemia: Secondary | ICD-10-CM | POA: Insufficient documentation

## 2020-02-18 DIAGNOSIS — Z1152 Encounter for screening for COVID-19: Secondary | ICD-10-CM | POA: Diagnosis not present

## 2020-02-18 DIAGNOSIS — I10 Essential (primary) hypertension: Secondary | ICD-10-CM

## 2020-02-18 MED ORDER — AMLODIPINE BESYLATE 5 MG PO TABS: 5.0000 mg | ORAL_TABLET | Freq: Every day | ORAL | 0 refills | Status: DC

## 2020-02-18 MED ORDER — POTASSIUM CHLORIDE CRYS ER 20 MEQ PO TBCR: 20.0000 meq | EXTENDED_RELEASE_TABLET | Freq: Two times a day (BID) | ORAL | 1 refills | Status: DC

## 2020-02-18 MED ORDER — TORSEMIDE 20 MG PO TABS: 20.0000 mg | ORAL_TABLET | Freq: Every day | ORAL | 1 refills | Status: DC

## 2020-02-18 NOTE — ED Notes (Signed)
Pt called x 3  No answer. 

## 2020-02-21 ENCOUNTER — Ambulatory Visit: Payer: BC Managed Care – PPO | Admitting: Internal Medicine

## 2020-02-21 ENCOUNTER — Encounter: Payer: Self-pay | Admitting: Internal Medicine

## 2020-02-21 ENCOUNTER — Other Ambulatory Visit: Payer: Self-pay

## 2020-02-21 VITALS — BP 150/90 | HR 74 | Temp 98.0°F | Ht 64.0 in | Wt 187.0 lb

## 2020-02-21 DIAGNOSIS — I10 Essential (primary) hypertension: Secondary | ICD-10-CM | POA: Diagnosis not present

## 2020-02-21 DIAGNOSIS — E876 Hypokalemia: Secondary | ICD-10-CM

## 2020-02-21 DIAGNOSIS — R5383 Other fatigue: Secondary | ICD-10-CM

## 2020-02-21 DIAGNOSIS — E349 Endocrine disorder, unspecified: Secondary | ICD-10-CM | POA: Diagnosis not present

## 2020-02-21 DIAGNOSIS — R739 Hyperglycemia, unspecified: Secondary | ICD-10-CM | POA: Diagnosis not present

## 2020-02-21 DIAGNOSIS — F5104 Psychophysiologic insomnia: Secondary | ICD-10-CM

## 2020-02-21 DIAGNOSIS — E222 Syndrome of inappropriate secretion of antidiuretic hormone: Secondary | ICD-10-CM

## 2020-02-21 LAB — HCG, QUANTITATIVE, PREGNANCY: Quantitative HCG: 5.87 m[IU]/mL

## 2020-02-21 LAB — BASIC METABOLIC PANEL
BUN: 16 mg/dL (ref 6–23)
CO2: 31 mEq/L (ref 19–32)
Calcium: 10.3 mg/dL (ref 8.4–10.5)
Chloride: 97 mEq/L (ref 96–112)
Creatinine, Ser: 0.71 mg/dL (ref 0.40–1.20)
GFR: 94.65 mL/min (ref >60.00)
Glucose, Bld: 82 mg/dL (ref 70–99)
Potassium: 3.5 mEq/L (ref 3.5–5.1)
Sodium: 137 mEq/L (ref 135–145)

## 2020-02-21 LAB — TSH: TSH: 1.37 u[IU]/mL (ref 0.35–4.50)

## 2020-02-21 LAB — HEMOGLOBIN A1C: Hgb A1c MFr Bld: 5.6 % (ref 4.6–6.5)

## 2020-02-21 LAB — SARS-COV-2 IGG: SARS-COV-2 IgG: 30.53

## 2020-02-21 MED ORDER — TRAZODONE HCL 50 MG PO TABS: 25.0000 mg | ORAL_TABLET | Freq: Every day | ORAL | 1 refills | Status: DC

## 2020-02-21 MED ORDER — IRBESARTAN 150 MG PO TABS: 150.0000 mg | ORAL_TABLET | Freq: Every day | ORAL | 1 refills | Status: DC

## 2020-02-21 MED ORDER — AMLODIPINE BESYLATE 10 MG PO TABS: 10.0000 mg | ORAL_TABLET | Freq: Every day | ORAL | 1 refills | Status: DC

## 2020-02-21 NOTE — Progress Notes (Signed)
Subjective:  Patient ID: Cheryl Taylor, female    DOB: [birth year]  Age: 55 y.o. MRN: [id number]  CC: Hypertension  This visit occurred during the SARS-CoV-2 public health emergency.  Safety protocols were in place, including screening questions prior to the visit, additional usage of staff PPE, and extensive cleaning of exam room while observing appropriate contact time as indicated for disinfecting solutions.    HPI Cheryl Taylor presents for f/up -  She tells me her blood pressure has been down to around 140/90.  The posterior headache has gotten better.  She has been taking NyQuil and Motrin.  She tells me she felt ill about 10 days ago with sore throat, fatigue, and muscle aches.  She says the only remaining symptom is fatigue.  She was recently seen in the ED and found to have a mildly elevated beta hCG.  She is postmenopausal.  She denies blurred vision, paresthesias, dizziness, lightheadedness, or edema.  She complains of insomnia.  Outpatient Medications Prior to Visit  Medication Sig Dispense Refill  . buPROPion (WELLBUTRIN XL) 150 MG 24 hr tablet Take 1 tablet (150 mg total) by mouth daily. 90 tablet 1  . cyanocobalamin 2000 MCG tablet Take 1 tablet (2,000 mcg total) by mouth daily. 90 tablet 1  . ferrous sulfate 325 (65 FE) MG tablet TAKE 1 TABLET (325 MG TOTAL) BY MOUTH 2 (TWO) TIMES DAILY WITH A MEAL. 180 tablet 1  . L-Tryptophan 500 MG TABS Take by mouth.    . nebivolol (BYSTOLIC) 5 MG tablet Take 1 tablet (5 mg total) by mouth daily. 90 tablet 1  . potassium chloride SA (KLOR-CON) 20 MEQ tablet Take 1 tablet (20 mEq total) by mouth 2 (two) times daily. 180 tablet 1  . torsemide (DEMADEX) 20 MG tablet Take 1 tablet (20 mg total) by mouth daily. 90 tablet 1  . valACYclovir (VALTREX) 1000 MG tablet Take 1 tablet (1,000 mg total) by mouth daily. 90 tablet 1  . amLODipine (NORVASC) 5 MG tablet Take 1 tablet (5 mg total) by mouth daily. 90 tablet 0   No facility-administered  medications prior to visit.    ROS Review of Systems  Constitutional: Positive for fatigue. Negative for appetite change, diaphoresis and unexpected weight change.  HENT: Negative.   Eyes: Negative for photophobia and visual disturbance.  Respiratory: Negative.  Negative for cough, chest tightness, shortness of breath and wheezing.   Cardiovascular: Negative for chest pain, palpitations and leg swelling.  Gastrointestinal: Negative for abdominal pain, diarrhea, nausea and vomiting.  Endocrine: Negative.   Genitourinary: Negative.  Negative for difficulty urinating.  Musculoskeletal: Negative for arthralgias, back pain, myalgias and neck pain.  Skin: Negative.  Negative for color change and pallor.  Neurological: Positive for headaches. Negative for dizziness and weakness.  Hematological: Negative for adenopathy. Does not bruise/bleed easily.  Psychiatric/Behavioral: Positive for sleep disturbance. Negative for dysphoric mood. The patient is not nervous/anxious.     Objective:  BP (!) 150/90 (BP Location: Left Arm, Patient Position: Sitting, Cuff Size: Large)   Pulse 74   Temp 98 F (36.7 C) (Oral)   Ht 5\' 4"  (1.626 m)   Wt 187 lb (84.8 kg)   LMP 11/13/2017   SpO2 99%   BMI 32.10 kg/m   BP Readings from Last 3 Encounters:  02/21/20 (!) 150/90  02/17/20 (!) 244/112  01/28/20 (!) 158/82    Wt Readings from Last 3 Encounters:  02/21/20 187 lb (84.8 kg)  01/28/20 189 lb 4  oz (85.8 kg)  07/30/19 192 lb (87.1 kg)    Physical Exam Vitals reviewed.  Constitutional:      Appearance: Normal appearance.  HENT:     Nose: Nose normal.     Mouth/Throat:     Mouth: Mucous membranes are moist.  Eyes:     General: No scleral icterus.    Conjunctiva/sclera: Conjunctivae normal.  Cardiovascular:     Rate and Rhythm: Normal rate and regular rhythm.     Heart sounds: No murmur heard.   Pulmonary:     Effort: Pulmonary effort is normal.     Breath sounds: No stridor. No  wheezing, rhonchi or rales.  Abdominal:     General: Abdomen is flat. There is no distension.     Palpations: Abdomen is soft. There is no hepatomegaly.     Tenderness: There is no abdominal tenderness.  Musculoskeletal:        General: Normal range of motion.     Cervical back: Neck supple.     Right lower leg: No edema.     Left lower leg: No edema.  Lymphadenopathy:     Cervical: No cervical adenopathy.  Skin:    General: Skin is warm and dry.     Coloration: Skin is not pale.  Neurological:     General: No focal deficit present.     Mental Status: She is alert.  Psychiatric:        Mood and Affect: Mood normal.     Lab Results  Component Value Date   WBC 8.6 02/17/2020   HGB 12.8 02/17/2020   HCT 39.6 02/17/2020   PLT 274 02/17/2020   GLUCOSE 82 02/21/2020   CHOL 234 (H) 07/30/2019   TRIG 216.0 (H) 07/30/2019   HDL 49.40 07/30/2019   LDLDIRECT 149.0 07/30/2019   LDLCALC 78 02/06/2018   ALT 19 07/30/2019   AST 18 07/30/2019   NA 137 02/21/2020   K 3.5 02/21/2020   CL 97 02/21/2020   CREATININE 0.71 02/21/2020   BUN 16 02/21/2020   CO2 31 02/21/2020   TSH 1.37 02/21/2020   HGBA1C 5.6 02/21/2020    No results found.  Assessment & Plan:   Kurstyn was seen today for hypertension.  Diagnoses and all orders for this visit:  Fatigue, unspecified type-  Her COVID ab is positive. This was likely related to a COVID-19 infection.  No treatment is indicated at this time. -     SARS-COV-2 IgG; Future -     SARS-COV-2 IgG  Elevated serum hCG- Her beta hCG is within the normal range for postmenopausal female. -     hCG, quantitative, pregnancy; Future -     hCG, quantitative, pregnancy  Essential hypertension- Her blood pressure is improving but still not adequately well controlled.  I will increase the dose of amlodipine and will add an ARB.  I have asked her to continue taking the beta-blocker and the loop diuretic. -     amLODipine (NORVASC) 10 MG tablet; Take  1 tablet (10 mg total) by mouth daily. -     Basic metabolic panel; Future -     TSH; Future -     irbesartan (AVAPRO) 150 MG tablet; Take 1 tablet (150 mg total) by mouth daily. -     TSH -     Basic metabolic panel  Hypokalemia, inadequate intake- Her potassium level is normal now. -     Basic metabolic panel; Future -     Basic  metabolic panel  Psychophysiological insomnia -     traZODone (DESYREL) 50 MG tablet; Take 0.5-1 tablets (25-50 mg total) by mouth at bedtime.  Hyperglycemia -     Hemoglobin A1c; Future -     Hemoglobin A1c  SIADH (syndrome of inappropriate ADH production) ([place])- This was likely caused by the SSRI.  Will avoid SSRIs for now.   I have discontinued [facility] A. Lenhoff's amLODipine. I am also having her start on amLODipine, traZODone, and irbesartan. Additionally, I am having her maintain her L-Tryptophan, cyanocobalamin, ferrous sulfate, nebivolol, buPROPion, valACYclovir, torsemide, and potassium chloride SA.  Meds ordered this encounter  Medications  . amLODipine (NORVASC) 10 MG tablet    Sig: Take 1 tablet (10 mg total) by mouth daily.    Dispense:  90 tablet    Refill:  1  . traZODone (DESYREL) 50 MG tablet    Sig: Take 0.5-1 tablets (25-50 mg total) by mouth at bedtime.    Dispense:  90 tablet    Refill:  1  . irbesartan (AVAPRO) 150 MG tablet    Sig: Take 1 tablet (150 mg total) by mouth daily.    Dispense:  90 tablet    Refill:  1     Follow-up: Return in about 3 months (around 05/21/2020).  [name], MD

## 2020-02-21 NOTE — Patient Instructions (Addendum)

## 2020-02-22 DIAGNOSIS — E222 Syndrome of inappropriate secretion of antidiuretic hormone: Secondary | ICD-10-CM | POA: Insufficient documentation

## 2020-02-24 ENCOUNTER — Encounter: Payer: Self-pay | Admitting: Internal Medicine

## 2020-02-24 MED ORDER — VALSARTAN 80 MG PO TABS: 80.0000 mg | ORAL_TABLET | Freq: Two times a day (BID) | ORAL | 1 refills | Status: DC

## 2020-02-24 NOTE — Addendum Note (Signed)
Addended by: Janith Lima on: 02/24/2020 10:12 AM   Modules accepted: Orders

## 2020-04-06 ENCOUNTER — Other Ambulatory Visit: Payer: Self-pay | Admitting: Internal Medicine

## 2020-04-06 DIAGNOSIS — I10 Essential (primary) hypertension: Secondary | ICD-10-CM

## 2020-04-09 ENCOUNTER — Telehealth: Payer: Self-pay | Admitting: Internal Medicine

## 2020-04-09 DIAGNOSIS — I10 Essential (primary) hypertension: Secondary | ICD-10-CM

## 2020-04-09 MED ORDER — AMLODIPINE BESYLATE 10 MG PO TABS: 10.0000 mg | ORAL_TABLET | Freq: Every day | ORAL | 0 refills | Status: DC

## 2020-04-09 NOTE — Telephone Encounter (Signed)
Patient is requesting a refill on the following medication. They are telling her there is no refills on the medication.  amLODipine (NORVASC) 10 MG tablet  [id number] - [place], [place] - [address] AT [address] Phone:  [phone]  Fax:  [phone]

## 2020-04-17 ENCOUNTER — Encounter: Payer: Self-pay | Admitting: Internal Medicine

## 2020-04-20 ENCOUNTER — Other Ambulatory Visit: Payer: Self-pay | Admitting: Internal Medicine

## 2020-04-20 DIAGNOSIS — Z9989 Dependence on other enabling machines and devices: Secondary | ICD-10-CM

## 2020-04-20 DIAGNOSIS — G4710 Hypersomnia, unspecified: Secondary | ICD-10-CM

## 2020-04-20 DIAGNOSIS — G4733 Obstructive sleep apnea (adult) (pediatric): Secondary | ICD-10-CM

## 2020-04-20 MED ORDER — SUNOSI 75 MG PO TABS: 1.0000 | ORAL_TABLET | Freq: Every morning | ORAL | 1 refills | Status: DC

## 2020-04-21 ENCOUNTER — Other Ambulatory Visit: Payer: Self-pay | Admitting: Internal Medicine

## 2020-04-21 ENCOUNTER — Telehealth: Payer: Self-pay

## 2020-04-21 DIAGNOSIS — F331 Major depressive disorder, recurrent, moderate: Secondary | ICD-10-CM

## 2020-04-21 MED ORDER — VORTIOXETINE HBR 5 MG PO TABS: 5.0000 mg | ORAL_TABLET | Freq: Every day | ORAL | 0 refills | Status: DC

## 2020-04-21 NOTE — Telephone Encounter (Signed)
Called to inform pt that [facility] PA was approved.  Pt states she wants something other than Wellbutrin for depression, not just sleepiness.

## 2020-04-21 NOTE — Telephone Encounter (Signed)
[place] approved effective 04/21/20 - 04/20/21 Pt notified & aware.

## 2020-04-23 NOTE — Telephone Encounter (Signed)
Pt has been informed that PA was approved. I suggested that she has the pharmacy run it again.

## 2020-04-23 NOTE — Telephone Encounter (Signed)
  Solriamfetol HCl (SUNOSI) 75 MG TABS Patient states [facility] to [facility] this medication  [id number] - [place], [place] - [address] Phone:  [phone]  Fax:  [phone]

## 2020-04-27 ENCOUNTER — Ambulatory Visit: Payer: BC Managed Care – PPO | Admitting: Internal Medicine

## 2020-05-02 ENCOUNTER — Encounter (INDEPENDENT_AMBULATORY_CARE_PROVIDER_SITE_OTHER): Payer: Self-pay

## 2020-05-21 ENCOUNTER — Ambulatory Visit: Payer: BC Managed Care – PPO | Admitting: Internal Medicine

## 2020-06-02 ENCOUNTER — Encounter: Payer: Self-pay | Admitting: Internal Medicine

## 2020-06-04 ENCOUNTER — Other Ambulatory Visit: Payer: Self-pay | Admitting: Internal Medicine

## 2020-06-04 DIAGNOSIS — G4710 Hypersomnia, unspecified: Secondary | ICD-10-CM

## 2020-06-04 DIAGNOSIS — G4733 Obstructive sleep apnea (adult) (pediatric): Secondary | ICD-10-CM

## 2020-06-04 MED ORDER — ARMODAFINIL 250 MG PO TABS: 250.0000 mg | ORAL_TABLET | Freq: Every day | ORAL | 1 refills | Status: DC

## 2020-06-05 ENCOUNTER — Other Ambulatory Visit: Payer: Self-pay | Admitting: Internal Medicine

## 2020-06-05 DIAGNOSIS — G4733 Obstructive sleep apnea (adult) (pediatric): Secondary | ICD-10-CM

## 2020-06-05 DIAGNOSIS — G4710 Hypersomnia, unspecified: Secondary | ICD-10-CM

## 2020-06-05 MED ORDER — ARMODAFINIL 250 MG PO TABS
250.0000 mg | ORAL_TABLET | Freq: Every day | ORAL | 1 refills | Status: DC
Start: 2020-06-05 — End: 2020-06-17

## 2020-06-17 ENCOUNTER — Other Ambulatory Visit: Payer: Self-pay | Admitting: Internal Medicine

## 2020-06-17 ENCOUNTER — Telehealth: Payer: Self-pay | Admitting: Internal Medicine

## 2020-06-17 DIAGNOSIS — G4710 Hypersomnia, unspecified: Secondary | ICD-10-CM

## 2020-06-17 DIAGNOSIS — G4733 Obstructive sleep apnea (adult) (pediatric): Secondary | ICD-10-CM

## 2020-06-17 MED ORDER — ARMODAFINIL 250 MG PO TABS: 250.0000 mg | ORAL_TABLET | Freq: Every day | ORAL | 1 refills | Status: DC

## 2020-06-17 NOTE — Telephone Encounter (Signed)
  Pharmacy calling to request Armodafinil 250 MG tablet Be sent to [facility] order Phone [phone] Fax [phone]

## 2020-07-15 ENCOUNTER — Encounter (INDEPENDENT_AMBULATORY_CARE_PROVIDER_SITE_OTHER): Payer: Self-pay

## 2020-07-16 ENCOUNTER — Encounter: Payer: Self-pay | Admitting: Internal Medicine

## 2020-07-17 ENCOUNTER — Other Ambulatory Visit: Payer: Self-pay | Admitting: Internal Medicine

## 2020-07-17 DIAGNOSIS — F331 Major depressive disorder, recurrent, moderate: Secondary | ICD-10-CM

## 2020-07-17 MED ORDER — ESCITALOPRAM OXALATE 5 MG PO TABS: 5.0000 mg | ORAL_TABLET | Freq: Every day | ORAL | 0 refills | Status: DC

## 2020-07-27 ENCOUNTER — Other Ambulatory Visit: Payer: Self-pay | Admitting: Internal Medicine

## 2020-07-27 DIAGNOSIS — E876 Hypokalemia: Secondary | ICD-10-CM

## 2020-07-27 DIAGNOSIS — F331 Major depressive disorder, recurrent, moderate: Secondary | ICD-10-CM

## 2020-07-27 DIAGNOSIS — I10 Essential (primary) hypertension: Secondary | ICD-10-CM

## 2020-07-27 MED ORDER — ESCITALOPRAM OXALATE 20 MG PO TABS: 20.0000 mg | ORAL_TABLET | Freq: Every day | ORAL | 0 refills | Status: DC

## 2020-08-04 DIAGNOSIS — F411 Generalized anxiety disorder: Secondary | ICD-10-CM | POA: Diagnosis not present

## 2020-08-27 ENCOUNTER — Other Ambulatory Visit: Payer: Self-pay | Admitting: Internal Medicine

## 2020-08-27 DIAGNOSIS — I10 Essential (primary) hypertension: Secondary | ICD-10-CM

## 2020-08-27 MED ORDER — NEBIVOLOL HCL 5 MG PO TABS: 5.0000 mg | ORAL_TABLET | Freq: Every day | ORAL | 1 refills | Status: DC

## 2020-08-29 ENCOUNTER — Encounter: Payer: Self-pay | Admitting: Internal Medicine

## 2020-09-19 ENCOUNTER — Encounter: Payer: Self-pay | Admitting: Internal Medicine

## 2020-09-21 ENCOUNTER — Encounter: Payer: Self-pay | Admitting: Internal Medicine

## 2020-09-25 ENCOUNTER — Other Ambulatory Visit: Payer: Self-pay | Admitting: Internal Medicine

## 2020-09-25 DIAGNOSIS — I10 Essential (primary) hypertension: Secondary | ICD-10-CM

## 2020-09-25 MED ORDER — NEBIVOLOL HCL 10 MG PO TABS
10.0000 mg | ORAL_TABLET | Freq: Every day | ORAL | 0 refills | Status: DC
Start: 2020-09-25 — End: 2020-12-25

## 2020-09-25 NOTE — Telephone Encounter (Signed)
Patient calling for an update on this medication. She said she does normally take the '5MG'$  but now wants the '10MG'$  prescription.  Please advise

## 2020-10-07 ENCOUNTER — Encounter: Payer: Self-pay | Admitting: Internal Medicine

## 2020-10-15 ENCOUNTER — Other Ambulatory Visit: Payer: Self-pay | Admitting: Internal Medicine

## 2020-10-15 DIAGNOSIS — I10 Essential (primary) hypertension: Secondary | ICD-10-CM

## 2020-10-19 ENCOUNTER — Other Ambulatory Visit: Payer: Self-pay | Admitting: Internal Medicine

## 2020-10-19 DIAGNOSIS — F331 Major depressive disorder, recurrent, moderate: Secondary | ICD-10-CM

## 2020-11-12 ENCOUNTER — Encounter: Payer: Self-pay | Admitting: Internal Medicine

## 2020-12-02 ENCOUNTER — Other Ambulatory Visit: Payer: Self-pay | Admitting: Internal Medicine

## 2020-12-02 DIAGNOSIS — R202 Paresthesia of skin: Secondary | ICD-10-CM

## 2020-12-02 DIAGNOSIS — R200 Anesthesia of skin: Secondary | ICD-10-CM | POA: Insufficient documentation

## 2020-12-08 ENCOUNTER — Encounter: Payer: Self-pay | Admitting: Internal Medicine

## 2020-12-08 ENCOUNTER — Ambulatory Visit: Payer: BC Managed Care – PPO | Admitting: Internal Medicine

## 2020-12-08 ENCOUNTER — Other Ambulatory Visit: Payer: Self-pay

## 2020-12-08 VITALS — BP 124/70 | HR 78 | Resp 18 | Ht 64.0 in | Wt 195.8 lb

## 2020-12-08 DIAGNOSIS — R200 Anesthesia of skin: Secondary | ICD-10-CM | POA: Diagnosis not present

## 2020-12-08 DIAGNOSIS — R202 Paresthesia of skin: Secondary | ICD-10-CM | POA: Diagnosis not present

## 2020-12-08 LAB — COMPREHENSIVE METABOLIC PANEL
ALT: 19 U/L (ref 0–35)
AST: 17 U/L (ref 0–37)
Albumin: 4.5 g/dL (ref 3.5–5.2)
Alkaline Phosphatase: 89 U/L (ref 39–117)
BUN: 11 mg/dL (ref 6–23)
CO2: 28 mEq/L (ref 19–32)
Calcium: 9.4 mg/dL (ref 8.4–10.5)
Chloride: 92 mEq/L — ABNORMAL LOW (ref 96–112)
Creatinine, Ser: 0.52 mg/dL (ref 0.40–1.20)
GFR: 102.84 mL/min (ref >60.00)
Glucose, Bld: 87 mg/dL (ref 70–99)
Potassium: 4.4 mEq/L (ref 3.5–5.1)
Sodium: 126 mEq/L — ABNORMAL LOW (ref 135–145)
Total Bilirubin: 0.4 mg/dL (ref 0.2–1.2)
Total Protein: 7.2 g/dL (ref 6.0–8.3)

## 2020-12-08 LAB — CBC
HCT: 34 % — ABNORMAL LOW (ref 36.0–46.0)
Hemoglobin: 11.6 g/dL — ABNORMAL LOW (ref 12.0–15.0)
MCHC: 34 g/dL (ref 30.0–36.0)
MCV: 86.6 fl (ref 78.0–100.0)
Platelets: 251 10*3/uL (ref 150.0–400.0)
RBC: 3.92 Mil/uL (ref 3.87–5.11)
RDW: 12.4 % (ref 11.5–15.5)
WBC: 7.6 10*3/uL (ref 4.0–10.5)

## 2020-12-08 LAB — TSH: TSH: 1.11 u[IU]/mL (ref 0.35–5.50)

## 2020-12-08 LAB — VITAMIN B12: Vitamin B-12: 1443 pg/mL — ABNORMAL HIGH (ref 211–911)

## 2020-12-08 LAB — VITAMIN D 25 HYDROXY (VIT D DEFICIENCY, FRACTURES): VITD: 33.69 ng/mL (ref 30.00–100.00)

## 2020-12-08 LAB — HEMOGLOBIN A1C: Hgb A1c MFr Bld: 5.9 % (ref 4.6–6.5)

## 2020-12-08 NOTE — Progress Notes (Signed)
   Subjective:   Patient ID: Cheryl Taylor, female    DOB: Jul 27, 1965, 55 y.o.   MRN: [id number]  HPI The patient is a 55 YO female coming in for new numbness and tingling arms. Right arm more and then some in the fingers and toes left arm and both legs. About 1 week ago and stable since then. Denies muscle weakness or speech changes or facial drooping. No true numbness but feels sensation different.   Review of Systems  Constitutional: Negative.   HENT: Negative.    Eyes: Negative.   Respiratory:  Negative for cough, chest tightness and shortness of breath.   Cardiovascular:  Negative for chest pain, palpitations and leg swelling.  Gastrointestinal:  Negative for abdominal distention, abdominal pain, constipation, diarrhea, nausea and vomiting.  Musculoskeletal: Negative.   Skin: Negative.   Neurological:  Positive for numbness.  Psychiatric/Behavioral: Negative.     Objective:  Physical Exam Constitutional:      Appearance: She is well-developed.  HENT:     Head: Normocephalic and atraumatic.  Cardiovascular:     Rate and Rhythm: Normal rate and regular rhythm.  Pulmonary:     Effort: Pulmonary effort is normal. No respiratory distress.     Breath sounds: Normal breath sounds. No wheezing or rales.  Abdominal:     General: Bowel sounds are normal. There is no distension.     Palpations: Abdomen is soft.     Tenderness: There is no abdominal tenderness. There is no rebound.  Musculoskeletal:     Cervical back: Normal range of motion.  Skin:    General: Skin is warm and dry.  Neurological:     Mental Status: She is alert and oriented to person, place, and time.     Cranial Nerves: No cranial nerve deficit.     Sensory: Sensory deficit present.     Motor: No weakness.     Coordination: Coordination normal.     Comments: Some sensory difference left and right arm. Legs same    Vitals:   12/08/20 1407  BP: 124/70  Pulse: 78  Resp: 18  SpO2: 98%  Weight: 195 lb 12.8  oz (88.8 kg)  Height: 5\' 4"  (1.626 m)   This visit occurred during the SARS-CoV-2 public health emergency.  Safety protocols were in place, including screening questions prior to the visit, additional usage of staff PPE, and extensive cleaning of exam room while observing appropriate contact time as indicated for disinfecting solutions.   Assessment & Plan:

## 2020-12-08 NOTE — Patient Instructions (Signed)
We will check the labs and the CT scan of the brain.

## 2020-12-09 ENCOUNTER — Other Ambulatory Visit: Payer: Self-pay | Admitting: Internal Medicine

## 2020-12-09 DIAGNOSIS — E871 Hypo-osmolality and hyponatremia: Secondary | ICD-10-CM

## 2020-12-11 NOTE — Assessment & Plan Note (Signed)
Ordered CT head to rule out stroke. She is outside the window. Checking HgA1c, B12, vitamin D, TSH, CBC, CMP to rule out metabolic causes. Treat as appropriate.

## 2020-12-13 ENCOUNTER — Other Ambulatory Visit: Payer: Self-pay | Admitting: Internal Medicine

## 2020-12-13 DIAGNOSIS — G4733 Obstructive sleep apnea (adult) (pediatric): Secondary | ICD-10-CM

## 2020-12-13 DIAGNOSIS — G4710 Hypersomnia, unspecified: Secondary | ICD-10-CM

## 2020-12-16 ENCOUNTER — Other Ambulatory Visit (INDEPENDENT_AMBULATORY_CARE_PROVIDER_SITE_OTHER): Payer: BC Managed Care – PPO

## 2020-12-16 ENCOUNTER — Encounter: Payer: Self-pay | Admitting: Internal Medicine

## 2020-12-16 ENCOUNTER — Other Ambulatory Visit: Payer: Self-pay

## 2020-12-16 DIAGNOSIS — E871 Hypo-osmolality and hyponatremia: Secondary | ICD-10-CM | POA: Diagnosis not present

## 2020-12-16 LAB — BASIC METABOLIC PANEL
BUN: 12 mg/dL (ref 6–23)
CO2: 27 mEq/L (ref 19–32)
Calcium: 9.1 mg/dL (ref 8.4–10.5)
Chloride: 92 mEq/L — ABNORMAL LOW (ref 96–112)
Creatinine, Ser: 0.68 mg/dL (ref 0.40–1.20)
GFR: 96.39 mL/min (ref >60.00)
Glucose, Bld: 86 mg/dL (ref 70–99)
Potassium: 4.2 mEq/L (ref 3.5–5.1)
Sodium: 126 mEq/L — ABNORMAL LOW (ref 135–145)

## 2020-12-17 ENCOUNTER — Ambulatory Visit: Payer: BC Managed Care – PPO | Admitting: Internal Medicine

## 2020-12-20 ENCOUNTER — Other Ambulatory Visit: Payer: Self-pay | Admitting: Internal Medicine

## 2020-12-20 DIAGNOSIS — I10 Essential (primary) hypertension: Secondary | ICD-10-CM

## 2020-12-23 ENCOUNTER — Ambulatory Visit: Payer: BC Managed Care – PPO | Admitting: Internal Medicine

## 2020-12-25 ENCOUNTER — Other Ambulatory Visit: Payer: Self-pay | Admitting: Internal Medicine

## 2020-12-25 DIAGNOSIS — I10 Essential (primary) hypertension: Secondary | ICD-10-CM

## 2020-12-28 ENCOUNTER — Other Ambulatory Visit: Payer: Self-pay | Admitting: Internal Medicine

## 2020-12-30 ENCOUNTER — Ambulatory Visit
Admission: RE | Admit: 2020-12-30 | Discharge: 2020-12-30 | Disposition: A | Discharge status: Home / Self Care | Payer: BC Managed Care – PPO | Source: Ambulatory Visit | Attending: Internal Medicine | Admitting: Internal Medicine

## 2020-12-30 ENCOUNTER — Other Ambulatory Visit: Payer: Self-pay

## 2020-12-30 DIAGNOSIS — R200 Anesthesia of skin: Secondary | ICD-10-CM

## 2020-12-30 DIAGNOSIS — R29818 Other symptoms and signs involving the nervous system: Secondary | ICD-10-CM | POA: Diagnosis not present

## 2020-12-31 ENCOUNTER — Telehealth: Payer: Self-pay | Admitting: Neurology

## 2020-12-31 ENCOUNTER — Institutional Professional Consult (permissible substitution): Payer: BC Managed Care – PPO | Admitting: Neurology

## 2020-12-31 ENCOUNTER — Encounter: Payer: Self-pay | Admitting: Internal Medicine

## 2020-12-31 NOTE — Telephone Encounter (Signed)
MD out sick- LVM & sent mychart message informing pt.

## 2021-01-05 ENCOUNTER — Encounter: Payer: Self-pay | Admitting: Internal Medicine

## 2021-01-05 ENCOUNTER — Other Ambulatory Visit: Payer: Self-pay

## 2021-01-05 ENCOUNTER — Ambulatory Visit: Payer: BC Managed Care – PPO | Admitting: Internal Medicine

## 2021-01-05 VITALS — BP 144/88 | HR 73 | Temp 98.5°F | Resp 16 | Ht 64.0 in | Wt 196.0 lb

## 2021-01-05 DIAGNOSIS — D513 Other dietary vitamin B12 deficiency anemia: Secondary | ICD-10-CM | POA: Diagnosis not present

## 2021-01-05 DIAGNOSIS — M79601 Pain in right arm: Secondary | ICD-10-CM | POA: Insufficient documentation

## 2021-01-05 DIAGNOSIS — I10 Essential (primary) hypertension: Secondary | ICD-10-CM

## 2021-01-05 DIAGNOSIS — D508 Other iron deficiency anemias: Secondary | ICD-10-CM | POA: Diagnosis not present

## 2021-01-05 DIAGNOSIS — Z0001 Encounter for general adult medical examination with abnormal findings: Secondary | ICD-10-CM | POA: Insufficient documentation

## 2021-01-05 DIAGNOSIS — M79602 Pain in left arm: Secondary | ICD-10-CM

## 2021-01-05 DIAGNOSIS — R202 Paresthesia of skin: Secondary | ICD-10-CM

## 2021-01-05 DIAGNOSIS — E222 Syndrome of inappropriate secretion of antidiuretic hormone: Secondary | ICD-10-CM

## 2021-01-05 LAB — CBC WITH DIFFERENTIAL/PLATELET
Basophils Absolute: 0.1 10*3/uL (ref 0.0–0.1)
Basophils Relative: 0.9 % (ref 0.0–3.0)
Eosinophils Absolute: 0.1 10*3/uL (ref 0.0–0.7)
Eosinophils Relative: 1.1 % (ref 0.0–5.0)
HCT: 33.4 % — ABNORMAL LOW (ref 36.0–46.0)
Hemoglobin: 11.2 g/dL — ABNORMAL LOW (ref 12.0–15.0)
Lymphocytes Relative: 32.6 % (ref 12.0–46.0)
Lymphs Abs: 1.8 10*3/uL (ref 0.7–4.0)
MCHC: 33.6 g/dL (ref 30.0–36.0)
MCV: 88.9 fl (ref 78.0–100.0)
Monocytes Absolute: 0.5 10*3/uL (ref 0.1–1.0)
Monocytes Relative: 8.3 % (ref 3.0–12.0)
Neutro Abs: 3.1 10*3/uL (ref 1.4–7.7)
Neutrophils Relative %: 57.1 % (ref 43.0–77.0)
Platelets: 303 10*3/uL (ref 150.0–400.0)
RBC: 3.76 Mil/uL — ABNORMAL LOW (ref 3.87–5.11)
RDW: 12.7 % (ref 11.5–15.5)
WBC: 5.4 10*3/uL (ref 4.0–10.5)

## 2021-01-05 LAB — BASIC METABOLIC PANEL
BUN: 13 mg/dL (ref 6–23)
CO2: 28 mEq/L (ref 19–32)
Calcium: 9.3 mg/dL (ref 8.4–10.5)
Chloride: 90 mEq/L — ABNORMAL LOW (ref 96–112)
Creatinine, Ser: 0.51 mg/dL (ref 0.40–1.20)
GFR: 103.27 mL/min (ref >60.00)
Glucose, Bld: 86 mg/dL (ref 70–99)
Potassium: 3.8 mEq/L (ref 3.5–5.1)
Sodium: 123 mEq/L — ABNORMAL LOW (ref 135–145)

## 2021-01-05 LAB — FOLATE: Folate: 8.2 ng/mL (ref >5.9)

## 2021-01-05 LAB — IBC + FERRITIN
Ferritin: 42.5 ng/mL (ref 10.0–291.0)
Iron: 167 ug/dL — ABNORMAL HIGH (ref 42–145)
Saturation Ratios: 42.3 % (ref 20.0–50.0)
TIBC: 394.8 ug/dL (ref 250.0–450.0)
Transferrin: 282 mg/dL (ref 212.0–360.0)

## 2021-01-05 LAB — VITAMIN B12: Vitamin B-12: 404 pg/mL (ref 211–911)

## 2021-01-05 LAB — TSH: TSH: 0.8 u[IU]/mL (ref 0.35–5.50)

## 2021-01-05 NOTE — Progress Notes (Signed)
Subjective:  Patient ID: Cheryl Taylor, female    DOB: 12/23/1965  Age: 55 y.o. MRN: [id number]  CC: Annual Exam, Hypertension, and Anemia  This visit occurred during the SARS-CoV-2 public health emergency.  Safety protocols were in place, including screening questions prior to the visit, additional usage of staff PPE, and extensive cleaning of exam room while observing appropriate contact time as indicated for disinfecting solutions.    HPI Cheryl Taylor presents for a CPX -  She complains of a several month hx of numbness and tingling in BUE's. She denies headache, neck pain, or slurred speech.  Outpatient Medications Prior to Visit  Medication Sig Dispense Refill   amLODipine (NORVASC) 10 MG tablet TAKE 1 TABLET BY MOUTH EVERY DAY (Patient taking differently: Every other day) 90 tablet 0   Armodafinil 250 MG tablet TAKE 1 TABLET BY MOUTH EVERY DAY 90 tablet 0   cyanocobalamin 2000 MCG tablet Take 1 tablet (2,000 mcg total) by mouth daily. 90 tablet 1   escitalopram (LEXAPRO) 20 MG tablet TAKE 1 TABLET BY MOUTH EVERY DAY 90 tablet 0   KLOR-CON M20 20 MEQ tablet TAKE 1 TABLET BY MOUTH TWICE A DAY 180 tablet 1   nebivolol (BYSTOLIC) 10 MG tablet TAKE 1 TABLET BY MOUTH EVERY DAY 90 tablet 0   traZODone (DESYREL) 50 MG tablet Take 0.5-1 tablets (25-50 mg total) by mouth at bedtime. 90 tablet 1   valACYclovir (VALTREX) 1000 MG tablet Take 1 tablet (1,000 mg total) by mouth daily. 90 tablet 1   No facility-administered medications prior to visit.    ROS Review of Systems  Constitutional:  Negative for chills, diaphoresis, fatigue, fever and unexpected weight change.  HENT: Negative.    Eyes: Negative.   Respiratory:  Negative for cough, chest tightness, shortness of breath and wheezing.   Cardiovascular:  Negative for chest pain, palpitations and leg swelling.  Gastrointestinal:  Negative for abdominal pain, constipation, diarrhea, nausea and vomiting.  Endocrine: Negative.   Negative for polyuria.  Genitourinary:  Negative for difficulty urinating, dysuria, frequency and urgency.  Musculoskeletal:  Negative for back pain and myalgias.  Skin: Negative.  Negative for color change and pallor.  Neurological:  Positive for numbness. Negative for dizziness, tremors, syncope, speech difficulty, weakness, light-headedness and headaches.  Hematological:  Negative for adenopathy. Does not bruise/bleed easily.  Psychiatric/Behavioral: Negative.     Objective:  BP (!) 144/88 (BP Location: Right Arm, Patient Position: Sitting, Cuff Size: Large) Comment: thigh cuff  Pulse 73   Temp 98.5 F (36.9 C) (Oral)   Resp 16   Ht 5\' 4"  (1.626 m)   Wt 196 lb (88.9 kg)   LMP 11/13/2017   SpO2 99%   BMI 33.64 kg/m   BP Readings from Last 3 Encounters:  01/05/21 (!) 144/88  12/08/20 124/70  02/21/20 (!) 150/90    Wt Readings from Last 3 Encounters:  01/05/21 196 lb (88.9 kg)  12/08/20 195 lb 12.8 oz (88.8 kg)  02/21/20 187 lb (84.8 kg)    Physical Exam Vitals reviewed.  Constitutional:      Appearance: Normal appearance.  HENT:     Nose: Nose normal.     Mouth/Throat:     Mouth: Mucous membranes are moist.  Eyes:     General: No scleral icterus.    Conjunctiva/sclera: Conjunctivae normal.     Pupils: Pupils are equal, round, and reactive to light.  Cardiovascular:     Rate and Rhythm: Normal rate and regular  rhythm.     Heart sounds: No murmur heard. Pulmonary:     Effort: Pulmonary effort is normal.     Breath sounds: No stridor. No wheezing, rhonchi or rales.  Abdominal:     General: Abdomen is flat.     Palpations: There is no mass.     Tenderness: There is no abdominal tenderness. There is no guarding.     Hernia: No hernia is present.  Musculoskeletal:        General: Normal range of motion.     Cervical back: Neck supple.     Right lower leg: No edema.     Left lower leg: No edema.  Lymphadenopathy:     Cervical: No cervical adenopathy.  Skin:     General: Skin is warm and dry.     Findings: No rash.  Neurological:     General: No focal deficit present.     Mental Status: She is alert and oriented to person, place, and time.     Cranial Nerves: Cranial nerves 2-12 are intact.     Sensory: Sensation is intact.     Motor: Motor function is intact.     Coordination: Coordination is intact. Coordination normal. Rapid alternating movements normal.     Gait: Gait is intact. Gait normal.     Deep Tendon Reflexes: Reflexes normal. Babinski sign absent on the right side. Babinski sign absent on the left side.     Reflex Scores:      Tricep reflexes are 0 on the right side and 0 on the left side.      Bicep reflexes are 1+ on the right side and 1+ on the left side.      Brachioradialis reflexes are 1+ on the right side and 1+ on the left side.      Patellar reflexes are 1+ on the right side and 1+ on the left side.      Achilles reflexes are 0 on the right side and 0 on the left side. Psychiatric:        Mood and Affect: Mood normal.        Behavior: Behavior normal.    Lab Results  Component Value Date   WBC 5.4 01/05/2021   HGB 11.2 (L) 01/05/2021   HCT 33.4 (L) 01/05/2021   PLT 303.0 01/05/2021   GLUCOSE 86 01/05/2021   CHOL 234 (H) 07/30/2019   TRIG 216.0 (H) 07/30/2019   HDL 49.40 07/30/2019   LDLDIRECT 149.0 07/30/2019   LDLCALC 78 02/06/2018   ALT 19 12/08/2020   AST 17 12/08/2020   NA 123 (L) 01/05/2021   K 3.8 01/05/2021   CL 90 (L) 01/05/2021   CREATININE 0.51 01/05/2021   BUN 13 01/05/2021   CO2 28 01/05/2021   TSH 0.80 01/05/2021   HGBA1C 5.9 12/08/2020    CT HEAD WO CONTRAST (5MM)  Result Date: 12/31/2020 CLINICAL DATA:  55 year old female with acute neurologic deficit. EXAM: CT HEAD WITHOUT CONTRAST TECHNIQUE: Contiguous axial images were obtained from the base of the skull through the vertex without intravenous contrast. COMPARISON:  Brain MRI 09/10/2017. FINDINGS: Brain: Cerebral volume is stable and  within normal limits for age. Small but conspicuous asymmetric hypodensity in the right thalamus on series 2, image 18, in an area that was normal on the 2019 MRI. Elsewhere normal gray-white matter differentiation. No midline shift, ventriculomegaly, mass effect, evidence of mass lesion, intracranial hemorrhage or evidence of cortically based acute infarction. Vascular: Mild Calcified atherosclerosis at  the skull base. No suspicious intracranial vascular hyperdensity. Skull: Negative. Sinuses/Orbits: Visualized paranasal sinuses and mastoids are clear. Other: Visualized orbits and scalp soft tissues are within normal limits. IMPRESSION: 1. 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 Query left side symptoms. 2. Otherwise negative for age noncontrast CT appearance of the brain. Electronically Signed   By: Genevie Ann M.D.   On: 12/31/2020 10:38    Assessment & Plan:   Abbigal was seen today for annual exam, hypertension and anemia.  Diagnoses and all orders for this visit:  Essential hypertension- Her BP is not at goal. Her Na+ is low. Will start a loop diuretic. -     CBC with Differential/Platelet; Future -     Basic metabolic panel; Future -     TSH; Future -     Urinalysis, Routine w reflex microscopic; Future -     Urinalysis, Routine w reflex microscopic -     TSH -     Basic metabolic panel -     CBC with Differential/Platelet -     torsemide (DEMADEX) 10 MG tablet; Take 1 tablet (10 mg total) by mouth daily.  Other dietary vitamin B12 deficiency anemia- She is still anemic but B12 and folate are normal. She may have anemic of chronic disease. -     CBC with Differential/Platelet; Future -     Folate; Future -     Vitamin B12; Future -     Vitamin B12 -     Folate -     CBC with Differential/Platelet  Iron deficiency anemia secondary to inadequate dietary iron intake- Her iron level is normal. -     CBC with Differential/Platelet;  Future -     IBC + Ferritin; Future -     IBC + Ferritin -     CBC with Differential/Platelet  Encounter for general adult medical examination with abnormal findings- Exam completed, labs reviewed, vaccines are UTD, cancer screenings are UTD, pt ed material was given.  SIADH (syndrome of inappropriate ADH production) ([place])- Her serum Na+ is low and the urinary Na+ is high. She is symptomatic. Will treat for SIADH with a loop diuretic and demeclocycline. -     Sodium, urine, random; Future -     Sodium, urine, random -     demeclocycline (DECLOMYCIN) 150 MG tablet; Take 1 tablet (150 mg total) by mouth 2 (two) times daily. -     torsemide (DEMADEX) 10 MG tablet; Take 1 tablet (10 mg total) by mouth daily.  Paresthesia and pain of both upper extremities -     Ambulatory referral to Neurology -     Vitamin B1; Future  I am having Cheryl Taylor start on demeclocycline and torsemide. I am also having her maintain her cyanocobalamin, valACYclovir, traZODone, [place]-Con M20, amLODipine, escitalopram, Armodafinil, and nebivolol.  Meds ordered this encounter  Medications   demeclocycline (DECLOMYCIN) 150 MG tablet    Sig: Take 1 tablet (150 mg total) by mouth 2 (two) times daily.    Dispense:  180 tablet    Refill:  0   torsemide (DEMADEX) 10 MG tablet    Sig: Take 1 tablet (10 mg total) by mouth daily.    Dispense:  90 tablet    Refill:  0      Follow-up: Return in about 3 months (around 04/05/2021).  Cheryl Calico, MD

## 2021-01-05 NOTE — Patient Instructions (Signed)

## 2021-01-06 ENCOUNTER — Ambulatory Visit: Payer: BC Managed Care – PPO | Admitting: Internal Medicine

## 2021-01-06 LAB — URINALYSIS, ROUTINE W REFLEX MICROSCOPIC
Bilirubin Urine: NEGATIVE
Ketones, ur: NEGATIVE
Leukocytes,Ua: NEGATIVE
Nitrite: NEGATIVE
Specific Gravity, Urine: 1.01 (ref 1.000–1.030)
Total Protein, Urine: NEGATIVE
Urine Glucose: NEGATIVE
Urobilinogen, UA: 0.2 (ref 0.0–1.0)
pH: 7 (ref 5.0–8.0)

## 2021-01-07 ENCOUNTER — Other Ambulatory Visit: Payer: BC Managed Care – PPO

## 2021-01-07 ENCOUNTER — Other Ambulatory Visit: Payer: Self-pay

## 2021-01-07 ENCOUNTER — Encounter: Payer: Self-pay | Admitting: Internal Medicine

## 2021-01-07 DIAGNOSIS — M79601 Pain in right arm: Secondary | ICD-10-CM

## 2021-01-07 DIAGNOSIS — R202 Paresthesia of skin: Secondary | ICD-10-CM | POA: Diagnosis not present

## 2021-01-07 DIAGNOSIS — M79602 Pain in left arm: Secondary | ICD-10-CM | POA: Diagnosis not present

## 2021-01-08 LAB — SODIUM, URINE, RANDOM: Sodium, Ur: 111 mmol/L (ref 28–272)

## 2021-01-08 MED ORDER — TORSEMIDE 10 MG PO TABS: 10.0000 mg | ORAL_TABLET | Freq: Every day | ORAL | 0 refills | Status: DC

## 2021-01-08 MED ORDER — DEMECLOCYCLINE HCL 150 MG PO TABS: 150.0000 mg | ORAL_TABLET | Freq: Two times a day (BID) | ORAL | 0 refills | Status: DC

## 2021-01-11 LAB — VITAMIN B1: Vitamin B1 (Thiamine): 40 nmol/L — ABNORMAL HIGH (ref 8–30)

## 2021-01-14 ENCOUNTER — Encounter: Payer: Self-pay | Admitting: Neurology

## 2021-01-14 DIAGNOSIS — R635 Abnormal weight gain: Secondary | ICD-10-CM

## 2021-01-14 DIAGNOSIS — G4710 Hypersomnia, unspecified: Secondary | ICD-10-CM

## 2021-01-14 DIAGNOSIS — G4733 Obstructive sleep apnea (adult) (pediatric): Secondary | ICD-10-CM

## 2021-01-14 DIAGNOSIS — R6889 Other general symptoms and signs: Secondary | ICD-10-CM

## 2021-01-14 DIAGNOSIS — Z9989 Dependence on other enabling machines and devices: Secondary | ICD-10-CM

## 2021-01-15 ENCOUNTER — Other Ambulatory Visit: Payer: Self-pay | Admitting: Internal Medicine

## 2021-01-15 DIAGNOSIS — F331 Major depressive disorder, recurrent, moderate: Secondary | ICD-10-CM

## 2021-01-15 DIAGNOSIS — I10 Essential (primary) hypertension: Secondary | ICD-10-CM

## 2021-01-18 ENCOUNTER — Ambulatory Visit: Payer: BC Managed Care – PPO | Admitting: Internal Medicine

## 2021-01-19 ENCOUNTER — Encounter: Payer: Self-pay | Admitting: Neurology

## 2021-01-20 ENCOUNTER — Ambulatory Visit: Payer: BC Managed Care – PPO | Admitting: Neurology

## 2021-01-20 ENCOUNTER — Encounter: Payer: Self-pay | Admitting: Neurology

## 2021-01-20 VITALS — BP 133/83 | HR 74 | Ht 64.0 in | Wt 191.5 lb

## 2021-01-20 DIAGNOSIS — G4733 Obstructive sleep apnea (adult) (pediatric): Secondary | ICD-10-CM

## 2021-01-20 DIAGNOSIS — G629 Polyneuropathy, unspecified: Secondary | ICD-10-CM | POA: Diagnosis not present

## 2021-01-20 DIAGNOSIS — F5104 Psychophysiologic insomnia: Secondary | ICD-10-CM

## 2021-01-20 DIAGNOSIS — Z9989 Dependence on other enabling machines and devices: Secondary | ICD-10-CM

## 2021-01-20 DIAGNOSIS — G4710 Hypersomnia, unspecified: Secondary | ICD-10-CM

## 2021-01-20 MED ORDER — ARMODAFINIL 250 MG PO TABS: 250.0000 mg | ORAL_TABLET | Freq: Every day | ORAL | 0 refills | Status: DC

## 2021-01-20 MED ORDER — TRAZODONE HCL 50 MG PO TABS: 25.0000 mg | ORAL_TABLET | ORAL | 1 refills | Status: DC | PRN

## 2021-01-20 NOTE — Progress Notes (Addendum)
SLEEP MEDICINE CLINIC    Provider:  Larey Seat, MD  Primary Care Physician:  Janith Lima, MD [place] [address]     Referring Provider: Janith Lima, [place],  [place] [address]          Chief Complaint according to patient   Patient presents with:     New Patient (Initial Visit)     RN : pt started CPAP in 2017. she is no longer established with a sleep MD. DME [facility]. she complains of extreme fatigue during the day. last week she was started on armodafinil. she has felt that has helped with the daytime fatigue.  Here for cpap management. Last seen on 02/28/19. Last ss done in 2017. Pt is needing a new CPAP, over 49 years old. No longer uploading to towers. Last month pt has not been using regularly.  Here for cpap management. Last seen on 02/28/19. Last ss done in 2017. Pt is needing a new CPAP, over 73 years old. No longer uploading to towers. Last month pt has not been using regularly.       HISTORY OF ILLNESS: 01-20-2021, Cheryl Taylor is a 55 y.o. year old Caucasian female patient seen here in a Rv  on 01/20/2021.  She is here to inquire if she can get a new CPAP machine - and she reports lack of compliance. She stated her husband is going to bed early and she doesn't want to switch the CPAP on not to wake him up. She appreciates better sleep while using it. She had the machine over 5 years.  She is OK with a HST to establish a new baseline. She would use the new machine in her bedroom and the old one in her guestroom.  She feels her seal is not as good, hasn't had new head gear in years. Now residual AHI 5-6 /h. FFM RESMED F airfit full.  Epworth Total = now at 5 points down from 17/ 24 points   FSS endorsed at 62/ 63 points.   HLA narcolepsy negative.  Hypersomnia controlled now, 250 mg armodafinil works       She was originally referred for persistent daytime sleepiness while compliant with CPAP.  Had her CPAP  for 3.5 years when I initially met with her.  Chief concern according to patient : Cheryl Taylor has been a moderately compliant CPAP user over the last 3 years about, but during the pandemic she has gained weight about 40 pounds she reports and her level of fatigue has just exacerbated.  CPAP never made her feel as if she was too much better rested but it helped her snoring which and return made her husband's night more restful.   I have the pleasure of seeing Cheryl Taylor today, a right -handed White or Caucasian female with a possible sleep disorder.  She has a medical history of  Anxiety, Arthritis, Back pain, Chest pain, Depression, Alcohol and Drug use, GERD (gastroesophageal reflux disease), HTN (hypertension), Hyperlipidemia, Obesity (08/10/2014), Obesity, Pain in both feet, Perimenopausal (11/18/2013), Raynaud's disease, Screening for cervical cancer (02/06/2015), OSA/ Sleep apnea on CPAP , SOB (shortness of breath), and Vitamin D deficiency The patient had the first sleep study in the year 2017.  Her sleep study was performed on 01 April 2015 as an attended sleep study under the guidance of Dr. Halford Chessman.  She took Benadryl at night to help her sleep, the respiratory parameters  for this attended sleep study showed an AHI of only 3.7/h and RDI of 7.5/h and REM sleep latency of 230 minutes.  It took her well over 40 minutes to fall asleep and her sleep efficiency was only 76%.  The diagnosis was hypersomnia and snoring.  She then underwent a home sleep test hoping to get a better sleep efficiency this was repeated on 07 May 2015, again the main reason for the study was hypersomnia this time she slept much better and was diagnosed with an AHI of 27.4/h of sleep there was no central apnea noted but looking back on the breakdown she did actually have 12 central apneas 68 obstructive apneas 7 mixed apneas and 96 hypopneas.  The diagnosis of sleep apnea in a more complex way was actually made but he was  diagnosed as having obstructive sleep apnea moderate severe started on an auto titration CPAP 5 through 15 cmH2O on which she has remained since.  My nurse was able to download the data from the current machine over the last 25 days the patient has used the machine 20 days equal to a compliance rate of 80 % , the average user time on days used was 7 hours 12 minutes, the minimum and maximum pressure are equal to the original recommendations 5 through 15 cmH2O with 3 cm EPR-expiratory pressure relief.  Residual AHI is higher than I like 6.7/h the 95th percentile pressure used is 12.4 cmH2O.  Few central apneas have emerged there is a moderate air leak.  The patient continues to report an Epworth Sleepiness Scale endorsed at 17 out of 24 points.  Family medical /sleep history: no other family member on CPAP with OSA, insomnia, sleep walkers.    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  The patient currently works in IT , used to work for [employer]. Pets are present. Tobacco use; never.  ETOH use; quit in 2013-  ,  Caffeine intake in form of Coffee( 3 cup ) Soda( none) Tea ( none ) - nor energy drinks. Regular exercise in form of none during the pandemic.   Hobbies : gardening, vegetable.    Sleep habits are as follows:  The patient's dinner time is between 6 PM. The patient goes to bed at 10 PM  Since Armodafinil - 10 days ago and continues to sleep for several hours, wakes for 1-2 bathroom breaks, the first time at 2 AM.   The preferred sleep position is supine and lateral-, with the support of 2 pillows.  Dreams are reportedly frequent/vivid. 9  AM is the usual rise time. The patient wakes up with an alarm.   She reports not feeling refreshed or restored in AM, with symptoms such as dry mouth- only when not using CPAP , but no morning  headaches-  There is residual fatigue. Naps are taken frequently, and she dreams in her naps.  The dreams are not always refreshing. No sleep paralysis, dream intrusion, dreams exhausting - no cataplexy.    Review of Systems: Out of a complete 14 system review, the patient complains of only the following symptoms, and all other reviewed systems are negative.:  Fatigue, sleepiness , snoring, restless sleep , vivid dreams.    How likely are you to doze in the following situations: 0 = not likely, 1 = slight chance,  2 = moderate chance, 3 = high chance   Sitting and Reading? Watching Television? Sitting inactive in a public place (theater or meeting)? As a passenger in a car for an hour without a break? Lying down in the afternoon when circumstances permit? Sitting and talking to someone? Sitting quietly after lunch without alcohol? In a car, while stopped for a few minutes in traffic?   Epworth Total = now at 5 points down from 17/ 24 points   FSS endorsed at 62/ 63 points.   HLA narcolepsy negative.  Hypersomnia controlled now, 250 mg armodafinil works  Social History   Socioeconomic History   Marital status: Married    Spouse name: Joe   Number of children: 1   Years of education: Not on file   Highest education level: Not on file  Occupational History   Occupation: Biomedical engineer     Comment: Arts development officer  Tobacco Use   Smoking status: Never   Smokeless tobacco: Never  Vaping Use   Vaping Use: Never used  Substance and Sexual Activity   Alcohol use: No    Alcohol/week: 50.0 standard drinks    Types: 50 Glasses of wine per week    Comment: quit ETOH on 2013   Drug use: Yes    Types: Amphetamines    Comment: Ativan 4-21m daily, no longer taking   Sexual activity: Yes    Partners: Male    Birth control/protection: Pill    Comment: lives with husband and daughter, works at [employer] no dietary restrictions  Other Topics Concern   Not on file  Social History  Narrative   Not on file   Social Determinants of Health   Financial Resource Strain: Not on file  Food Insecurity: Not on file  Transportation Needs: Not on file  Physical Activity: Not on file  Stress: Not on file  Social Connections: Not on file    Family History  Problem Relation Age of Onset   Kidney failure Father 754      deceased   Kidney disease Father    Obesity Father    Seizures Father    Heart disease Father 785  Hypertension Father    Hyperlipidemia Father    Stroke Father    Sleep apnea Father    Schizophrenia Brother        597yo  Alcohol abuse Brother    Other Brother        chronic pain, 541yo   Stroke Brother    Alcohol abuse Sister        437yo  Obesity Paternal Grandfather    Stroke Sister    Depression Mother    Anxiety disorder Mother    Obesity Mother    Colon cancer Neg Hx    Colon polyps Neg Hx    Esophageal cancer Neg Hx    Rectal cancer Neg Hx    Stomach cancer Neg Hx     Past Medical History:  Diagnosis Date   Alcohol abuse, in remission    Anger    Anxiety    Arthritis    feet    Back pain    Chest pain    Deficiency anemia 07/31/2019   Depression    Drug use    Fatigue 02/15/2015   GERD (gastroesophageal reflux disease)    HTN (hypertension)    Hyperlipidemia    Obesity 08/10/2014   Obesity    Pain in both feet  arthritis / Hallox Rigidus   Perimenopausal 11/18/2013   Raynaud's disease    Screening for cervical cancer 02/06/2015   Sleep apnea    wears cpap    SOB (shortness of breath)    Vitamin D deficiency     Past Surgical History:  Procedure Laterality Date   CESAREAN SECTION  2003   TONSILLECTOMY  1980     Current Outpatient Medications on File Prior to Visit  Medication Sig Dispense Refill   amLODipine (NORVASC) 10 MG tablet TAKE 1 TABLET BY MOUTH EVERY DAY 90 tablet 0   Armodafinil 250 MG tablet TAKE 1 TABLET BY MOUTH EVERY DAY 90 tablet 0   demeclocycline (DECLOMYCIN) 150 MG tablet Take 1 tablet  (150 mg total) by mouth 2 (two) times daily. 180 tablet 0   escitalopram (LEXAPRO) 20 MG tablet TAKE 1 TABLET BY MOUTH EVERY DAY 90 tablet 0   KLOR-CON M20 20 MEQ tablet TAKE 1 TABLET BY MOUTH TWICE A DAY 180 tablet 1   nebivolol (BYSTOLIC) 10 MG tablet TAKE 1 TABLET BY MOUTH EVERY DAY 90 tablet 0   torsemide (DEMADEX) 10 MG tablet Take 1 tablet (10 mg total) by mouth daily. 90 tablet 0   traZODone (DESYREL) 50 MG tablet Take 0.5-1 tablets (25-50 mg total) by mouth at bedtime. (Patient taking differently: Take 25-50 mg by mouth as needed.) 90 tablet 1   valACYclovir (VALTREX) 1000 MG tablet Take 1 tablet (1,000 mg total) by mouth daily. 90 tablet 1   No current facility-administered medications on file prior to visit.   Physical exam:  Today's Vitals   01/20/21 0858  BP: 133/83  Pulse: 74  Weight: 191 lb 8 oz (86.9 kg)  Height: _0  (1.626 m)   Body mass index is 32.87 kg/m.   Wt Readings from Last 3 Encounters:  01/20/21 191 lb 8 oz (86.9 kg)  01/05/21 196 lb (88.9 kg)  12/08/20 195 lb 12.8 oz (88.8 kg)     Ht Readings from Last 3 Encounters:  01/20/21 _1  (1.626 m)  01/05/21 _2  (1.626 m)  12/08/20 _3  (1.626 m)      General: The patient is awake, alert and appears not in acute distress. The patient is well groomed. Head: Normocephalic, atraumatic. Neck is supple. Mallampati 3 ,  neck circumference:16 inches . Nasal airflow patent.  Retrognathia is not  seen.  Dental status: intact. Cardiovascular:  Regular rate and cardiac rhythm by pulse,  without distended neck veins. Respiratory: Lungs are clear to auscultation.  Skin:  Without evidence of ankle edema, or rash. She has fast capillary refill in fingers and toes.  Trunk: The patient's posture is erect.   Neurologic exam : The patient is awake and alert, oriented to place and time.   Memory subjective described as intact.  Attention span & concentration ability appears normal.  Speech is fluent,  without   dysarthria, dysphonia or aphasia.  Mood and affect are giddy.   Cranial nerves: no loss of smell or taste reported  Pupils are equal and briskly reactive to light. Funduscopic exam deferred. .  Extraocular movements in vertical and horizontal planes were intact and without nystagmus. Visual fields by finger perimetry are intact. Hearing was intact to soft voice and finger rubbing.    Facial sensation intact to fine touch.  Facial motor strength is symmetric and tongue and uvula move midline.  Neck ROM : rotation, tilt and flexion extension were normal for age and shoulder shrug was  symmetrical.    Motor exam:  Symmetric bulk, tone and ROM.    Sensory:  Fine touch and vibration were normal in hands and ankles. She reports numbness in the finger berries of the right hand, achiness, deep pain, radiating up to the elbow.   Coordination: Rapid alternating movements in the fingers/hands were of normal speed.   Gait and station: Patient could rise unassisted from a seated position, walked without assistive device.  Stance is of normal width/ base and the patient turned with 3 steps.  Toe and heel walk were deferred.  Deep tendon reflexes: in the upper and lower extremities are symmetric and intact.  Babinski response was deferred.     After spending a total time of 25 minutes face to face and additional time for physical and neurologic examination, review of laboratory studies,  personal review of imaging studies, reports and results of other testing and review of referral information / records as far as provided in visit, I have established the following assessments:     My Plan is to proceed with:  1) I ordered a Neuropathy panel, brother has neuropathy, painful sensory  in right hand, finger-berries  are arm and pink. - Unfortunately, she just had labs with PCP and many would overlap with this panel- our visit time was over and I cancelled the panel. I like in the future an ANA, serum  Protein electrophoresis, and hepatitis ab  Chiropractor) .  2) needs new HST to get new CPAP, but has to really work on COMPLIANCE -  4 hours each day and better more - she has been having some air leak issues that are most likely related headgear.   3) I do not have have to reset her 10 pus years old CPAP , it will remain  at  a setting between 6 and 17 cmH2O was 2 cm EPR. I ordered the HST to have a baseline    I would like to thank Janith Lima, MD [place],  [place] [address] for allowing me to meet with and to take care of this pleasant patient.   In short, Cheryl Taylor is presenting with persistent and recurrent hypersomnia on CPAP following a weight gain-   I plan to follow up either personally or through our NP within 2 month.   CC: I will share my notes with PCP .  Electronically signed by: Larey Seat, MD 01/20/2021 9:23 AM  [facility] Board certified by The [employer] of Sleep Medicine and Diplomate of the [employer] of Sleep Medicine. Board certified In Neurology through the [place], Fellow of the [employer] of Neurology. Medical Director of [facility].

## 2021-01-20 NOTE — Patient Instructions (Signed)
Hypersomnia Hypersomnia is a condition in which a person feels very tired during the day even though he or she gets plenty of sleep at night. A person with this condition may take naps during the day and may find it very difficult to wake up from sleep. Hypersomnia may affect a person's ability to think, concentrate, drive, or remember things. What are the causes? The cause of this condition may not be known. Possible causes include: Certain medicines. Sleep disorders, such as narcolepsy and sleep apnea. Injury to the head, brain, or spinal cord. Drug or alcohol use. Gastroesophageal reflux disease (GERD). Tumors. Certain medical conditions, such as depression, diabetes, or an underactive thyroid gland (hypothyroidism). What are the signs or symptoms? The main symptoms of hypersomnia include: Feeling very tired throughout the day, regardless of how much sleep you got the night before. Having trouble waking up. Others may find it difficult to wake you up when you are sleeping. Sleeping for longer and longer periods at a time. Taking naps throughout the day. Other symptoms may include: Feeling restless, anxious, or annoyed. Lacking energy. Having trouble with: Remembering. Speaking. Thinking. Loss of appetite. Seeing, hearing, tasting, smelling, or feeling things that are not real (hallucinations). How is this diagnosed? This condition may be diagnosed based on: Your symptoms and medical history. Your sleeping habits. Your health care provider may ask you to write down your sleeping habits in a daily sleep log, along with any symptoms you have. A series of tests that are done while you sleep (sleep study or polysomnogram). A test that measures how quickly you can fall asleep during the day (daytime nap study or multiple sleep latency test). How is this treated? Treatment can help you manage your condition. Treatment may include: Following a regular sleep routine. Lifestyle changes,  such as changing your eating habits, getting regular exercise, and avoiding alcohol or caffeinated beverages. Taking medicines to make you more alert (stimulants) during the day. Treating any underlying medical causes of hypersomnia. Follow these instructions at home: Sleep routine  Schedule the same bedtime and wake-up time each day. Practice a relaxing bedtime routine. This may include reading, meditation, deep breathing, or taking a warm bath before going to sleep. Get regular exercise each day. Avoid strenuous exercise in the evening hours. Keep your sleep environment at a cooler temperature, darkened, and quiet. Sleep with pillows and a mattress that are comfortable and supportive. Schedule short 20-minute naps for when you feel sleepiest during the day. Talk with your employer or teachers about your hypersomnia. If possible, adjust your schedule so that: You have a regular daytime work schedule. You can take a scheduled nap during the day. You do not have to work or be active at night. Do not eat a heavy meal for a few hours before bedtime. Eat your meals at about the same times every day. Avoid drinking alcohol or caffeinated beverages. Safety  Do not drive or use heavy machinery if you are sleepy. Ask your health care provider if it is safe for you to drive. Wear a life jacket when swimming or spending time near water. General instructions Take supplements and over-the-counter and prescription medicines only as told by your health care provider. Keep a sleep log that will help your doctor manage your condition. This may include information about: What time you go to bed each night. How often you wake up at night. How many hours you sleep at night. How often and for how long you nap during the day.   Any observations from others, such as leg movements during sleep, sleep walking, or snoring. Keep all follow-up visits as told by your health care provider. This is important. Contact  a health care provider if: You have new symptoms. Your symptoms get worse. Get help right away if: You have serious thoughts about hurting yourself or someone else. If you ever feel like you may hurt yourself or others, or have thoughts about taking your own life, get help right away. You can go to your nearest emergency department or call: Your local emergency services (911 in the [place] A suicide crisis helpline, such as the National Suicide Prevention Lifeline at 1-800-273-8255 or 988 in the [place] This is open 24 hours a day. Summary Hypersomnia refers to a condition in which you feel very tired during the day even though you get plenty of sleep at night. A person with this condition may take naps during the day and may find it very difficult to wake up from sleep. Hypersomnia may affect a person's ability to think, concentrate, drive, or remember things. Treatment, such as following a regular sleep routine and making some lifestyle changes, can help you manage your condition. This information is not intended to replace advice given to you by your health care provider. Make sure you discuss any questions you have with your health care provider. Document Revised: 08/12/2020 Document Reviewed: 11/28/2019 Elsevier Patient Education  2022 [employer] Inc.  

## 2021-01-28 DIAGNOSIS — G4733 Obstructive sleep apnea (adult) (pediatric): Secondary | ICD-10-CM | POA: Diagnosis not present

## 2021-01-29 ENCOUNTER — Other Ambulatory Visit: Payer: Self-pay | Admitting: Internal Medicine

## 2021-01-29 ENCOUNTER — Encounter (INDEPENDENT_AMBULATORY_CARE_PROVIDER_SITE_OTHER): Payer: Self-pay

## 2021-01-29 DIAGNOSIS — E876 Hypokalemia: Secondary | ICD-10-CM

## 2021-01-29 DIAGNOSIS — I10 Essential (primary) hypertension: Secondary | ICD-10-CM

## 2021-02-04 ENCOUNTER — Telehealth: Payer: Self-pay | Admitting: Adult Health

## 2021-02-04 ENCOUNTER — Telehealth: Payer: Self-pay | Admitting: Neurology

## 2021-02-04 NOTE — Telephone Encounter (Signed)
LVM for pt to call me back to schedule sleep study  

## 2021-02-04 NOTE — Telephone Encounter (Signed)
Cheryl Taylor needed her afternoon rescheduled on this day. It looks like this patient was just seen by Dr. Brett Fairy the end of December and was supposed to follow up with Cheryl Taylor in March. The patient took the 1/10 appointment from the wait list. I called and lvm that Cheryl Taylor will be out and I rescheduled the appointment for her next available, which is in March when she was supposed to be seen to begin with. I let her know she can message with providers through MyChart  or call us back.

## 2021-02-08 ENCOUNTER — Telehealth: Payer: Self-pay | Admitting: Neurology

## 2021-02-08 NOTE — Telephone Encounter (Signed)
Returned pt's call and LVM for pt to call me back to schedule sleep study ° °

## 2021-02-09 ENCOUNTER — Ambulatory Visit: Payer: BC Managed Care – PPO | Admitting: Adult Health

## 2021-02-15 ENCOUNTER — Ambulatory Visit (INDEPENDENT_AMBULATORY_CARE_PROVIDER_SITE_OTHER): Payer: BC Managed Care – PPO | Admitting: Neurology

## 2021-02-15 DIAGNOSIS — G4733 Obstructive sleep apnea (adult) (pediatric): Secondary | ICD-10-CM

## 2021-02-15 DIAGNOSIS — F5104 Psychophysiologic insomnia: Secondary | ICD-10-CM

## 2021-02-15 DIAGNOSIS — G4710 Hypersomnia, unspecified: Secondary | ICD-10-CM

## 2021-02-18 NOTE — Progress Notes (Signed)
[facility] °  °HOME SLEEP TEST REPORT ( by Watch PAT)   °STUDY DATE:  1-18- 2023 °DOB:  08/16/1965 ° °  °ORDERING CLINICIAN: Carmen Dohmeier, MD  °REFERRING CLINICIAN:  °  °CLINICAL INFORMATION/HISTORY:  °Started CPAP in 2017,no longer established with a sleep MD. DME is [facility]. She complains of extreme fatigue during the day. last week she was started on armodafinil. she has felt that has helped with the daytime fatigue. ° Here for cpap management. Last seen on 02/28/19. Last ss done in 2017. Pt is needing a new CPAP, over 5 years old. No data uploading -pt has not been using regularly.  °  °  °HISTORY OF ILLNESS: 01-20-2021, °Beckett A Virgen is a 55 y.o. year old Caucasian female patient seen here in a Rv  on 01/20/2021. °She is here to inquire if she can get a new CPAP machine - and she reports lack of compliance.  °She stated her husband is going to bed early and she doesn't want to switch the CPAP on not to wake him up. She appreciates better sleep while using it. She had the machine over 5 years.  °She is OK with a HST to establish a new baseline. Patient used modafinil for hypersomnia/  ° ° °  °Epworth sleepiness score: 12/24. FSS at 62/ 63 points. HLA narcolepsy panel was negative.  °  °BMI: 32.7 kg/m² °  °Neck Circumference: 16" °  °FINDINGS: °  °Sleep Summary: °  °Total Recording Time (hours, min): 6 hours 33 minutes    °  °Total Sleep Time (hours, min): 6 hours and 2 minutes            °  °Percent REM (%):     13%                                 °  °Respiratory Indices: °  °Calculated pAHI (per hour):   39.2/h                        °  °REM pAHI: 34.6/h                                             °  °NREM pAHI:    39.9/h                        °  °Positional AHI:    Neither positional data nor data about snoring could be retrieved.                                             °  °Oxygen Saturation Statistics: °  °Oxygen Saturation (%) Mean:     93%        °  °Between a nadir of 85 and a maximum of  98% saturation.                                   °  °O2 Saturation (minutes) <89%: 1.5 minutes        °  °Pulse Rate Statistics: °  °  Pulse Mean (bpm): 57 bpm              °  °Pulse Range: Between 48 and 69 bpm             °  °IMPRESSION:  This HST confirms the presence of overall severe sleep apnea which interestingly was not worse during REM sleep unfortunately neither positional data nor snoring could be evaluated. °  °RECOMMENDATION: The patient should continue using her CPAP and new machine will be ordered as hers is now 58 years old, the settings will be 5 through 15 cmH2O pressure was 2 cm EPR, heated humidification.  Please fit the patient for a mask of her choice and comfort. She has reported air leaks.  ° °  °INTERPRETING PHYSICIAN: ° ° Carmen Dohmeier, MD  ° °Medical Director of [facility].   ° °  °  °  °  °  °  °  °   °  ° ° ° °

## 2021-02-23 NOTE — Progress Notes (Signed)
Calculated pAHI (per hour): 39.2/h REM pAHI: 34.6/h NREM pAHI:39.9/h Positional AHI: Neither positional data nor data about snoring could be retrieved.  IMPRESSION:  This HST confirms the presence of overall severe sleep apnea which interestingly was not worse during REM sleep unfortunately neither positional data nor snoring could be evaluated.  RECOMMENDATION: The patient should continue using her CPAP and new machine will be ordered as hers is now 58 years old, the settings will be 5 through 15 cmH2O pressure was 2 cm EPR, heated humidification.  Please fit the patient for a mask of her choice and comfort. She has reported air leaks.

## 2021-02-23 NOTE — Addendum Note (Signed)
Addended by: Larey Seat on: 02/23/2021 06:00 PM   Modules accepted: Orders

## 2021-02-23 NOTE — Procedures (Signed)
[facility] TEST REPORT ( by Watch PAT)   STUDY DATE:  1-18- 2023 DOB:  09-19-1965    ORDERING CLINICIAN: Larey Seat, MD  REFERRING CLINICIAN:    CLINICAL INFORMATION/HISTORY:  Started CPAP in 2017,no longer established with a sleep MD. DME is [facility]. She complains of extreme fatigue during the day. last week she was started on armodafinil. she has felt that has helped with the daytime fatigue.  Here for cpap management. Last seen on 02/28/19. Last ss done in 2017. Pt is needing a new CPAP, over 75 years old. No data uploading -pt has not been using regularly.      HISTORY OF ILLNESS: 01-20-2021, Cheryl Taylor is a 56 y.o. year old Caucasian female patient seen here in a Rv  on 01/20/2021. She is here to inquire if she can get a new CPAP machine - and she reports lack of compliance.  She stated her husband is going to bed early and she doesn't want to switch the CPAP on not to wake him up. She appreciates better sleep while using it. She had the machine over 5 years.  She is OK with a HST to establish a new baseline. Patient used modafinil for hypersomnia/      Epworth sleepiness score: 12/24. FSS at 62/ 63 points. HLA narcolepsy panel was negative.    BMI: 32.7 kg/m   Neck Circumference: 16"   FINDINGS:   Sleep Summary:   Total Recording Time (hours, min): 6 hours 33 minutes      Total Sleep Time (hours, min): 6 hours and 2 minutes              Percent REM (%):     13%                                   Respiratory Indices:   Calculated pAHI (per hour):   39.2/h                          REM pAHI: 34.6/h                                               NREM pAHI:    39.9/h                          Positional AHI:    Neither positional data nor data about snoring could be retrieved.                                               Oxygen Saturation Statistics:   Oxygen Saturation (%) Mean:     93%          Between a nadir of 85 and a maximum of  98% saturation.                                     O2 Saturation (minutes) <89%: 1.5 minutes          Pulse Rate Statistics:  Pulse Mean (bpm): 57 bpm                Pulse Range: Between 48 and 69 bpm               IMPRESSION:  This HST confirms the presence of overall severe sleep apnea which interestingly was not worse during REM sleep unfortunately neither positional data nor snoring could be evaluated.   RECOMMENDATION: The patient should continue using her CPAP and new machine will be ordered as hers is now 56 years old, the settings will be 5 through 15 cmH2O pressure was 2 cm EPR, heated humidification.  Please fit the patient for a mask of her choice and comfort. She has reported air leaks.     INTERPRETING PHYSICIAN:   Larey Seat, MD   Medical Director of [facility] at [facility].

## 2021-02-24 ENCOUNTER — Encounter: Payer: Self-pay | Admitting: Neurology

## 2021-02-24 ENCOUNTER — Telehealth: Payer: Self-pay | Admitting: Neurology

## 2021-02-24 NOTE — Telephone Encounter (Signed)
I called pt. I advised pt that Dr. Brett Fairy reviewed their sleep study results and found that pt has severe sleep apnea. Dr. Brett Fairy recommends that pt starts auto CPAP. I reviewed PAP compliance expectations with the pt. Pt is agreeable to starting a CPAP. I advised pt that an order will be sent to a DME, [facility], and [facility] will call the pt within about one week after they file with the pt's insurance. [facility] will show the pt how to use the machine, fit for masks, and troubleshoot the CPAP if needed. A follow up appt was made for insurance purposes with Frann Rider on 04/14/21 at 3:15 pm. Pt verbalized understanding to arrive 15 minutes early and bring their CPAP. A letter with all of this information in it will be mailed to the pt as a reminder. I verified with the pt that the address we have on file is correct. Pt verbalized understanding of results. Pt had no questions at this time but was encouraged to call back if questions arise. I have sent the order to [facility] and have received confirmation that they have received the order.

## 2021-02-24 NOTE — Telephone Encounter (Signed)
-----   Message from Larey Seat, MD sent at 02/23/2021  6:00 PM EST ----- Calculated pAHI (per hour): 39.2/h REM pAHI: 34.6/h NREM pAHI:39.9/h Positional AHI: Neither positional data nor data about snoring could be retrieved.  IMPRESSION:  This HST confirms the presence of overall severe sleep apnea which interestingly was not worse during REM sleep unfortunately neither positional data nor snoring could be evaluated.  RECOMMENDATION: The patient should continue using her CPAP and new machine will be ordered as hers is now 56 years old, the settings will be 5 through 15 cmH2O pressure was 2 cm EPR, heated humidification.  Please fit the patient for a mask of her choice and comfort. She has reported air leaks.

## 2021-03-15 ENCOUNTER — Other Ambulatory Visit: Payer: Self-pay | Admitting: Internal Medicine

## 2021-03-15 DIAGNOSIS — G4710 Hypersomnia, unspecified: Secondary | ICD-10-CM

## 2021-03-15 DIAGNOSIS — G4733 Obstructive sleep apnea (adult) (pediatric): Secondary | ICD-10-CM

## 2021-03-20 ENCOUNTER — Encounter: Payer: Self-pay | Admitting: Internal Medicine

## 2021-03-24 ENCOUNTER — Ambulatory Visit: Payer: BC Managed Care – PPO | Admitting: Neurology

## 2021-03-24 ENCOUNTER — Encounter: Payer: Self-pay | Admitting: Neurology

## 2021-03-24 VITALS — BP 163/86 | HR 78 | Ht 64.0 in | Wt 191.0 lb

## 2021-03-24 DIAGNOSIS — M792 Neuralgia and neuritis, unspecified: Secondary | ICD-10-CM

## 2021-03-24 DIAGNOSIS — D508 Other iron deficiency anemias: Secondary | ICD-10-CM | POA: Diagnosis not present

## 2021-03-24 DIAGNOSIS — D513 Other dietary vitamin B12 deficiency anemia: Secondary | ICD-10-CM | POA: Diagnosis not present

## 2021-03-24 DIAGNOSIS — G629 Polyneuropathy, unspecified: Secondary | ICD-10-CM | POA: Diagnosis not present

## 2021-03-24 NOTE — Patient Instructions (Signed)
Peripheral Neuropathy ?Peripheral neuropathy is a type of nerve damage. It affects nerves that carry signals between the spinal cord and the arms, legs, and the rest of the body (peripheral nerves). It does not affect nerves in the spinal cord or brain. In peripheral neuropathy, one nerve or a group of nerves may be damaged. Peripheral neuropathy is a broad category that includes many specific nerve disorders, like diabetic neuropathy, hereditary neuropathy, and carpal tunnel syndrome. ?What are the causes? ?This condition may be caused by: ?Diabetes. This is the most common cause of peripheral neuropathy. ?Nerve injury. ?Pressure or stress on a nerve that lasts a long time. ?Lack (deficiency) of B vitamins. This can result from alcoholism, poor diet, or a restricted diet. ?Infections. ?Autoimmune diseases, such as rheumatoid arthritis and systemic lupus erythematosus. ?Nerve diseases that are passed from parent to child (inherited). ?Some medicines, such as cancer medicines (chemotherapy). ?Poisonous (toxic) substances, such as lead and mercury. ?Too little blood flowing to the legs. ?Kidney disease. ?Thyroid disease. ?In some cases, the cause of this condition is not known. ?What are the signs or symptoms? ?Symptoms of this condition depend on which of your nerves is damaged. Common symptoms include: ?Loss of feeling (numbness) in the feet, hands, or both. ?Tingling in the feet, hands, or both. ?Burning pain. ?Very sensitive skin. ?Weakness. ?Not being able to move a part of the body (paralysis). ?Muscle twitching. ?Clumsiness or poor coordination. ?Loss of balance. ?Not being able to control your bladder. ?Feeling dizzy. ?Sexual problems. ?How is this diagnosed? ?Diagnosing and finding the cause of peripheral neuropathy can be difficult. Your health care provider will take your medical history and do a physical exam. A neurological exam will also be done. This involves checking things that are affected by your  brain, spinal cord, and nerves (nervous system). For example, your health care provider will check your reflexes, how you move, and what you can feel. ?You may have other tests, such as: ?Blood tests. ?Electromyogram (EMG) and nerve conduction tests. These tests check nerve function and how well the nerves are controlling the muscles. ?Imaging tests, such as CT scans or MRI to rule out other causes of your symptoms. ?Removing a small piece of nerve to be examined in a lab (nerve biopsy). ?Removing and examining a small amount of the fluid that surrounds the brain and spinal cord (lumbar puncture). ?How is this treated? ?Treatment for this condition may involve: ?Treating the underlying cause of the neuropathy, such as diabetes, kidney disease, or vitamin deficiencies. ?Stopping medicines that can cause neuropathy, such as chemotherapy. ?Medicine to help relieve pain. Medicines may include: ?Prescription or over-the-counter pain medicine. ?Antiseizure medicine. ?Antidepressants. ?Pain-relieving patches that are applied to painful areas of skin. ?Surgery to relieve pressure on a nerve or to destroy a nerve that is causing pain. ?Physical therapy to help improve movement and balance. ?Devices to help you move around (assistive devices). ?Follow these instructions at home: ?Medicines ?Take over-the-counter and prescription medicines only as told by your health care provider. Do not take any other medicines without first asking your health care provider. ?Do not drive or use heavy machinery while taking prescription pain medicine. ?Lifestyle ? ?Do not use any products that contain nicotine or tobacco, such as cigarettes and e-cigarettes. Smoking keeps blood from reaching damaged nerves. If you need help quitting, ask your health care provider. ?Avoid or limit alcohol. Too much alcohol can cause a vitamin B deficiency, and vitamin B is needed for healthy nerves. ?Eat a   healthy diet. This includes: ?Eating foods that are  high in fiber, such as fresh fruits and vegetables, whole grains, and beans. ?Limiting foods that are high in fat and processed sugars, such as fried or sweet foods. ?General instructions ? ?If you have diabetes, work closely with your health care provider to keep your blood sugar under control. ?If you have numbness in your feet: ?Check every day for signs of injury or infection. Watch for redness, warmth, and swelling. ?Wear padded socks and comfortable shoes. These help protect your feet. ?Develop a good support system. Living with peripheral neuropathy can be stressful. Consider talking with a mental health specialist or joining a support group. ?Use assistive devices and attend physical therapy as told by your health care provider. This may include using a walker or a cane. ?Keep all follow-up visits as told by your health care provider. This is important. ?Contact a health care provider if: ?You have new signs or symptoms of peripheral neuropathy. ?You are struggling emotionally from dealing with peripheral neuropathy. ?Your pain is not well-controlled. ?Get help right away if: ?You have an injury or infection that is not healing normally. ?You develop new weakness in an arm or leg. ?You have fallen or do so frequently. ?Summary ?Peripheral neuropathy is when the nerves in the arms, or legs are damaged, resulting in numbness, weakness, or pain. ?There are many causes of peripheral neuropathy, including diabetes, pinched nerves, vitamin deficiencies, autoimmune disease, and hereditary conditions. ?Diagnosing and finding the cause of peripheral neuropathy can be difficult. Your health care provider will take your medical history, do a physical exam, and do tests, including blood tests and nerve function tests. ?Treatment involves treating the underlying cause of the neuropathy and taking medicines to help control pain. Physical therapy and assistive devices may also help. ?This information is not intended to  replace advice given to you by your health care provider. Make sure you discuss any questions you have with your health care provider. ?Document Revised: 10/29/2019 Document Reviewed: 10/29/2019 ?Elsevier Patient Education ? 2022 Elsevier Inc. ? ?

## 2021-03-24 NOTE — Progress Notes (Signed)
Established Patient Office Visit  Subjective:  Patient ID: Cheryl Taylor, female    DOB: 05-27-1965  Age: 56 y.o. MRN: [id number]  CC:  Chief Complaint  Patient presents with   Consult    Rm 11, alone. Pt referred for numbness and tingling in fingers and hands. Started around Nov/Dec and has increased about a month ago Pulsing hot pn. Worse in the morning.     HPI Cheryl Taylor presents for neuropathic pain today, has not yet received he new CPAP.  Neuropathy panel ordered, added ANA and plasma e phoresis.   Past Medical History:  Diagnosis Date   Alcohol abuse, in remission    Anger    Anxiety    Arthritis    feet    Back pain    Chest pain    Deficiency anemia 07/31/2019   Depression    Drug use    Fatigue 02/15/2015   GERD (gastroesophageal reflux disease)    HTN (hypertension)    Hyperlipidemia    Obesity 08/10/2014   Obesity    Pain in both feet    arthritis / Hallox Rigidus   Perimenopausal 11/18/2013   Raynaud's disease    Screening for cervical cancer 02/06/2015   Sleep apnea    wears cpap    SOB (shortness of breath)    Vitamin D deficiency     Past Surgical History:  Procedure Laterality Date   CESAREAN SECTION  2003   TONSILLECTOMY  1980    Family History  Problem Relation Age of Onset   Kidney failure Father 62       deceased   Kidney disease Father    Obesity Father    Seizures Father    Heart disease Father 30   Hypertension Father    Hyperlipidemia Father    Stroke Father    Sleep apnea Father    Schizophrenia Brother        41yo   Alcohol abuse Brother    Other Brother        chronic pain, 7 yo   Stroke Brother    Alcohol abuse Sister        9yo   Obesity Paternal Grandfather    Stroke Sister    Depression Mother    Anxiety disorder Mother    Obesity Mother    Colon cancer Neg Hx    Colon polyps Neg Hx    Esophageal cancer Neg Hx    Rectal cancer Neg Hx    Stomach cancer Neg Hx     Social History   Socioeconomic  History   Marital status: Married    Spouse name: Joe   Number of children: 1   Years of education: Not on file   Highest education level: Bachelor's degree (e.g., BA, AB, BS)  Occupational History   Occupation: Biomedical engineer     Comment: [employer]  Tobacco Use   Smoking status: Never   Smokeless tobacco: Never  Vaping Use   Vaping Use: Never used  Substance and Sexual Activity   Alcohol use: No    Alcohol/week: 50.0 standard drinks    Types: 50 Glasses of wine per week    Comment: quit ETOH on 2013   Drug use: Yes    Types: Amphetamines    Comment: Ativan 4-5mg  daily, no longer taking   Sexual activity: Yes    Partners: Male    Birth control/protection: Pill    Comment: lives with husband and daughter, works  at [employer], no dietary restrictions  Other Topics Concern   Not on file  Social History Narrative   Lives wih husband and daughter   Right handed   Caffeine: 1-2 cups a day   Social Determinants of Health   Financial Resource Strain: Not on file  Food Insecurity: Not on file  Transportation Needs: Not on file  Physical Activity: Not on file  Stress: Not on file  Social Connections: Not on file  Intimate Partner Violence: Not on file    Outpatient Medications Prior to Visit  Medication Sig Dispense Refill   amLODipine (NORVASC) 10 MG tablet TAKE 1 TABLET BY MOUTH EVERY DAY 90 tablet 0   Armodafinil 250 MG tablet TAKE 1 TABLET BY MOUTH EVERY DAY 90 tablet 0   demeclocycline (DECLOMYCIN) 150 MG tablet Take 1 tablet (150 mg total) by mouth 2 (two) times daily. 180 tablet 0   escitalopram (LEXAPRO) 20 MG tablet TAKE 1 TABLET BY MOUTH EVERY DAY 90 tablet 0   KLOR-CON M20 20 MEQ tablet TAKE 1 TABLET BY MOUTH TWICE A DAY 180 tablet 1   nebivolol (BYSTOLIC) 10 MG tablet TAKE 1 TABLET BY MOUTH EVERY DAY 90 tablet 0   torsemide (DEMADEX) 10 MG tablet Take 1 tablet (10 mg total) by mouth daily. 90 tablet 0   traZODone (DESYREL) 50 MG tablet Take 0.5-1 tablets  (25-50 mg total) by mouth as needed. 180 tablet 1   valACYclovir (VALTREX) 1000 MG tablet Take 1 tablet (1,000 mg total) by mouth daily. 90 tablet 1   No facility-administered medications prior to visit.    No Known Allergies  ROS    Objective:    Physical Exam  BP (!) 163/86    Pulse 78    Ht 5\' 4"  (1.626 m)    Wt 191 lb (86.6 kg)    LMP 11/13/2017    BMI 32.79 kg/m  Wt Readings from Last 3 Encounters:  03/24/21 191 lb (86.6 kg)  01/20/21 191 lb 8 oz (86.9 kg)  01/05/21 196 lb (88.9 kg)     There are no preventive care reminders to display for this patient.  There are no preventive care reminders to display for this patient.  Lab Results  Component Value Date   TSH 0.80 01/05/2021   Lab Results  Component Value Date   WBC 5.4 01/05/2021   HGB 11.2 (L) 01/05/2021   HCT 33.4 (L) 01/05/2021   MCV 88.9 01/05/2021   PLT 303.0 01/05/2021   Lab Results  Component Value Date   NA 123 (L) 01/05/2021   K 3.8 01/05/2021   CO2 28 01/05/2021   GLUCOSE 86 01/05/2021   BUN 13 01/05/2021   CREATININE 0.51 01/05/2021   BILITOT 0.4 12/08/2020   ALKPHOS 89 12/08/2020   AST 17 12/08/2020   ALT 19 12/08/2020   PROT 7.2 12/08/2020   ALBUMIN 4.5 12/08/2020   CALCIUM 9.3 01/05/2021   ANIONGAP 11 02/17/2020   GFR 103.27 01/05/2021   Lab Results  Component Value Date   CHOL 234 (H) 07/30/2019   Lab Results  Component Value Date   HDL 49.40 07/30/2019   Lab Results  Component Value Date   LDLCALC 78 02/06/2018   Lab Results  Component Value Date   TRIG 216.0 (H) 07/30/2019   Lab Results  Component Value Date   CHOLHDL 5 07/30/2019   Lab Results  Component Value Date   HGBA1C 5.9 12/08/2020      Assessment & Plan:  Problem List Items Addressed This Visit     Other dietary vitamin B12 deficiency anemia   Relevant Orders   Protein Electrophoresis, Serum   HCV Ab w Reflex to Quant PCR   Other Visit Diagnoses     Other iron deficiency anemia    -   Primary   Relevant Orders   ANA,IFA RA Diag Pnl w/rflx Tit/Patn   Protein Electrophoresis, Serum   HCV Ab w Reflex to Quant PCR   Neuropathy       Relevant Orders   ANA,IFA RA Diag Pnl w/rflx Tit/Patn   Protein Electrophoresis, Serum   HCV Ab w Reflex to Quant PCR   Neuropathic pain of finger of right hand       Relevant Orders   Protein Electrophoresis, Serum   HCV Ab w Reflex to Quant PCR       No orders of the defined types were placed in this encounter.   Follow-up: Return in about 6 months (around 09/21/2021), or if symptoms worsen or fail to improve.    Larey Seat, MD

## 2021-03-24 NOTE — Progress Notes (Signed)
Established Patient Office Visit  Subjective:  Patient ID: Cheryl Taylor, female    DOB: [birth year]  Age: 56 y.o. MRN: [id number]  CC:  Chief Complaint  Patient presents with   Consult    Rm 11, alone. Pt referred for numbness and tingling in fingers and hands. Started around Nov/Dec and has increased about a month ago Pulsing hot pn. Worse in the morning.     HPI Cheryl Taylor presents for neuropathic pain/   Past Medical History:  Diagnosis Date   Alcohol abuse, in remission    Anger    Anxiety    Arthritis    feet    Back pain    Chest pain    Deficiency anemia 07/31/2019   Depression    Drug use    Fatigue 02/15/2015   GERD (gastroesophageal reflux disease)    HTN (hypertension)    Hyperlipidemia    Obesity 08/10/2014   Obesity    Pain in both feet    arthritis / Hallox Rigidus   Perimenopausal 11/18/2013   Raynaud's disease    Screening for cervical cancer 02/06/2015   Sleep apnea    wears cpap    SOB (shortness of breath)    Vitamin D deficiency     Past Surgical History:  Procedure Laterality Date   CESAREAN SECTION  2003   TONSILLECTOMY  1980    Family History  Problem Relation Age of Onset   Kidney failure Father 52       deceased   Kidney disease Father    Obesity Father    Seizures Father    Heart disease Father 29   Hypertension Father    Hyperlipidemia Father    Stroke Father    Sleep apnea Father    Schizophrenia Brother        58yo   Alcohol abuse Brother    Other Brother        chronic pain, 24 yo   Stroke Brother    Alcohol abuse Sister        63yo   Obesity Paternal Grandfather    Stroke Sister    Depression Mother    Anxiety disorder Mother    Obesity Mother    Colon cancer Neg Hx    Colon polyps Neg Hx    Esophageal cancer Neg Hx    Rectal cancer Neg Hx    Stomach cancer Neg Hx     Social History   Socioeconomic History   Marital status: Married    Spouse name: Joe   Number of children: 1   Years of  education: Not on file   Highest education level: Bachelor's degree (e.g., BA, AB, BS)  Occupational History   Occupation: Biomedical engineer     Comment: [employer]  Tobacco Use   Smoking status: Never   Smokeless tobacco: Never  Vaping Use   Vaping Use: Never used  Substance and Sexual Activity   Alcohol use: No    Alcohol/week: 50.0 standard drinks    Types: 50 Glasses of wine per week    Comment: quit ETOH on 2013   Drug use: Yes    Types: Amphetamines    Comment: Ativan 4-5mg  daily, no longer taking   Sexual activity: Yes    Partners: Male    Birth control/protection: Pill    Comment: lives with husband and daughter, works at [employer], no dietary restrictions  Other Topics Concern   Not on file  Social History  Narrative   Lives wih husband and daughter   Right handed   Caffeine: 1-2 cups a day   Social Determinants of Health   Financial Resource Strain: Not on file  Food Insecurity: Not on file  Transportation Needs: Not on file  Physical Activity: Not on file  Stress: Not on file  Social Connections: Not on file  Intimate Partner Violence: Not on file    Outpatient Medications Prior to Visit  Medication Sig Dispense Refill   amLODipine (NORVASC) 10 MG tablet TAKE 1 TABLET BY MOUTH EVERY DAY 90 tablet 0   Armodafinil 250 MG tablet TAKE 1 TABLET BY MOUTH EVERY DAY 90 tablet 0   demeclocycline (DECLOMYCIN) 150 MG tablet Take 1 tablet (150 mg total) by mouth 2 (two) times daily. 180 tablet 0   escitalopram (LEXAPRO) 20 MG tablet TAKE 1 TABLET BY MOUTH EVERY DAY 90 tablet 0   KLOR-CON M20 20 MEQ tablet TAKE 1 TABLET BY MOUTH TWICE A DAY 180 tablet 1   nebivolol (BYSTOLIC) 10 MG tablet TAKE 1 TABLET BY MOUTH EVERY DAY 90 tablet 0   torsemide (DEMADEX) 10 MG tablet Take 1 tablet (10 mg total) by mouth daily. 90 tablet 0   traZODone (DESYREL) 50 MG tablet Take 0.5-1 tablets (25-50 mg total) by mouth as needed. 180 tablet 1   valACYclovir (VALTREX) 1000 MG tablet  Take 1 tablet (1,000 mg total) by mouth daily. 90 tablet 1   No facility-administered medications prior to visit.    No Known Allergies  ROS Review of Systems    Objective:    Physical Exam  BP (!) 163/86    Pulse 78    Ht 5\' 4"  (1.626 m)    Wt 191 lb (86.6 kg)    LMP 11/13/2017    BMI 32.79 kg/m  Wt Readings from Last 3 Encounters:  03/24/21 191 lb (86.6 kg)  01/20/21 191 lb 8 oz (86.9 kg)  01/05/21 196 lb (88.9 kg)     There are no preventive care reminders to display for this patient.  There are no preventive care reminders to display for this patient.  Lab Results  Component Value Date   TSH 0.80 01/05/2021   Lab Results  Component Value Date   WBC 5.4 01/05/2021   HGB 11.2 (L) 01/05/2021   HCT 33.4 (L) 01/05/2021   MCV 88.9 01/05/2021   PLT 303.0 01/05/2021   Lab Results  Component Value Date   NA 123 (L) 01/05/2021   K 3.8 01/05/2021   CO2 28 01/05/2021   GLUCOSE 86 01/05/2021   BUN 13 01/05/2021   CREATININE 0.51 01/05/2021   BILITOT 0.4 12/08/2020   ALKPHOS 89 12/08/2020   AST 17 12/08/2020   ALT 19 12/08/2020   PROT 7.2 12/08/2020   ALBUMIN 4.5 12/08/2020   CALCIUM 9.3 01/05/2021   ANIONGAP 11 02/17/2020   GFR 103.27 01/05/2021   Lab Results  Component Value Date   CHOL 234 (H) 07/30/2019   Lab Results  Component Value Date   HDL 49.40 07/30/2019   Lab Results  Component Value Date   LDLCALC 78 02/06/2018   Lab Results  Component Value Date   TRIG 216.0 (H) 07/30/2019   Lab Results  Component Value Date   CHOLHDL 5 07/30/2019   Lab Results  Component Value Date   HGBA1C 5.9 12/08/2020      Assessment & Plan:   Problem List Items Addressed This Visit     Other dietary vitamin  B12 deficiency anemia   Relevant Orders   Protein Electrophoresis, Serum   HCV Ab w Reflex to Quant PCR   Other Visit Diagnoses     Other iron deficiency anemia    -  Primary   Relevant Orders   ANA,IFA RA Diag Pnl w/rflx Tit/Patn   Protein  Electrophoresis, Serum   HCV Ab w Reflex to Quant PCR   Neuropathy       Relevant Orders   ANA,IFA RA Diag Pnl w/rflx Tit/Patn   Protein Electrophoresis, Serum   HCV Ab w Reflex to Quant PCR   Neuropathic pain of finger of right hand       Relevant Orders   Protein Electrophoresis, Serum   HCV Ab w Reflex to Quant PCR       No orders of the defined types were placed in this encounter.   Follow-up: Return in about 6 months (around 09/21/2021), or if symptoms worsen or fail to improve.    Larey Seat, MD

## 2021-03-25 NOTE — Progress Notes (Signed)
Awaiting full panel return.

## 2021-03-26 ENCOUNTER — Other Ambulatory Visit: Payer: Self-pay | Admitting: Internal Medicine

## 2021-03-26 DIAGNOSIS — I10 Essential (primary) hypertension: Secondary | ICD-10-CM

## 2021-03-28 NOTE — Progress Notes (Signed)
Still waiting for ANA with Western Blot reflex.

## 2021-03-30 LAB — HCV AB W REFLEX TO QUANT PCR: HCV Ab: NONREACTIVE

## 2021-03-30 LAB — PROTEIN ELECTROPHORESIS, SERUM
A/G Ratio: 1.3 (ref 0.7–1.7)
Albumin ELP: 4.2 g/dL (ref 2.9–4.4)
Alpha 1: 0.3 g/dL (ref 0.0–0.4)
Alpha 2: 0.7 g/dL (ref 0.4–1.0)
Beta: 1.1 g/dL (ref 0.7–1.3)
Gamma Globulin: 1.2 g/dL (ref 0.4–1.8)
Globulin, Total: 3.3 g/dL (ref 2.2–3.9)
Total Protein: 7.5 g/dL (ref 6.0–8.5)

## 2021-03-30 LAB — ANA,IFA RA DIAG PNL W/RFLX TIT/PATN
ANA Titer 1: NEGATIVE
Cyclic Citrullin Peptide Ab: 5 units (ref 0–19)
Rheumatoid fact SerPl-aCnc: 10.9 IU/mL (ref <14.0)

## 2021-03-30 LAB — HCV INTERPRETATION

## 2021-03-30 NOTE — Progress Notes (Signed)
All normal results for ANA and protein e phoresis.

## 2021-03-31 ENCOUNTER — Encounter: Payer: Self-pay | Admitting: Internal Medicine

## 2021-04-01 ENCOUNTER — Other Ambulatory Visit: Payer: BC Managed Care – PPO

## 2021-04-01 ENCOUNTER — Other Ambulatory Visit: Payer: Self-pay

## 2021-04-01 ENCOUNTER — Other Ambulatory Visit: Payer: Self-pay | Admitting: Internal Medicine

## 2021-04-01 DIAGNOSIS — Z20822 Contact with and (suspected) exposure to covid-19: Secondary | ICD-10-CM | POA: Insufficient documentation

## 2021-04-02 LAB — SARS-COV-2 IGG: SARS-COV-2 IgG: 27.14

## 2021-04-06 ENCOUNTER — Ambulatory Visit: Payer: BC Managed Care – PPO | Admitting: Internal Medicine

## 2021-04-08 ENCOUNTER — Other Ambulatory Visit: Payer: Self-pay | Admitting: Internal Medicine

## 2021-04-08 DIAGNOSIS — I10 Essential (primary) hypertension: Secondary | ICD-10-CM

## 2021-04-08 DIAGNOSIS — E222 Syndrome of inappropriate secretion of antidiuretic hormone: Secondary | ICD-10-CM

## 2021-04-13 ENCOUNTER — Ambulatory Visit: Payer: BC Managed Care – PPO | Admitting: Adult Health

## 2021-04-14 ENCOUNTER — Ambulatory Visit: Payer: BC Managed Care – PPO | Admitting: Adult Health

## 2021-04-24 ENCOUNTER — Other Ambulatory Visit: Payer: Self-pay | Admitting: Internal Medicine

## 2021-04-24 DIAGNOSIS — F331 Major depressive disorder, recurrent, moderate: Secondary | ICD-10-CM

## 2021-04-24 DIAGNOSIS — I10 Essential (primary) hypertension: Secondary | ICD-10-CM

## 2021-04-28 DIAGNOSIS — G4733 Obstructive sleep apnea (adult) (pediatric): Secondary | ICD-10-CM | POA: Diagnosis not present

## 2021-04-29 ENCOUNTER — Ambulatory Visit: Payer: BC Managed Care – PPO | Admitting: Adult Health

## 2021-05-11 ENCOUNTER — Ambulatory Visit: Payer: BC Managed Care – PPO | Admitting: Internal Medicine

## 2021-05-24 ENCOUNTER — Other Ambulatory Visit: Payer: Self-pay | Admitting: Internal Medicine

## 2021-05-24 DIAGNOSIS — A6009 Herpesviral infection of other urogenital tract: Secondary | ICD-10-CM

## 2021-06-24 ENCOUNTER — Other Ambulatory Visit: Payer: Self-pay | Admitting: Internal Medicine

## 2021-06-24 ENCOUNTER — Other Ambulatory Visit: Payer: Self-pay | Admitting: Neurology

## 2021-06-24 DIAGNOSIS — G4733 Obstructive sleep apnea (adult) (pediatric): Secondary | ICD-10-CM

## 2021-06-24 DIAGNOSIS — E222 Syndrome of inappropriate secretion of antidiuretic hormone: Secondary | ICD-10-CM

## 2021-06-24 DIAGNOSIS — G4710 Hypersomnia, unspecified: Secondary | ICD-10-CM

## 2021-06-24 NOTE — Telephone Encounter (Signed)
Last OV was on 03/24/21.  Next OV is pending to be scheduled.  Last RX was written on 03/18/21 for 90 tabs.   [place] [facility] Database has been reviewed.

## 2021-06-27 ENCOUNTER — Other Ambulatory Visit: Payer: Self-pay | Admitting: Internal Medicine

## 2021-06-27 DIAGNOSIS — E222 Syndrome of inappropriate secretion of antidiuretic hormone: Secondary | ICD-10-CM

## 2021-06-27 DIAGNOSIS — I10 Essential (primary) hypertension: Secondary | ICD-10-CM

## 2021-07-26 ENCOUNTER — Other Ambulatory Visit: Payer: Self-pay | Admitting: Internal Medicine

## 2021-07-26 DIAGNOSIS — I10 Essential (primary) hypertension: Secondary | ICD-10-CM

## 2021-07-26 DIAGNOSIS — F331 Major depressive disorder, recurrent, moderate: Secondary | ICD-10-CM

## 2021-08-04 ENCOUNTER — Encounter: Payer: Self-pay | Admitting: Internal Medicine

## 2021-08-04 ENCOUNTER — Ambulatory Visit: Payer: BC Managed Care – PPO | Admitting: Internal Medicine

## 2021-08-04 VITALS — BP 180/88 | HR 70 | Temp 98.5°F | Resp 16 | Ht 64.0 in | Wt 195.0 lb

## 2021-08-04 DIAGNOSIS — Z1231 Encounter for screening mammogram for malignant neoplasm of breast: Secondary | ICD-10-CM

## 2021-08-04 DIAGNOSIS — Z0001 Encounter for general adult medical examination with abnormal findings: Secondary | ICD-10-CM | POA: Diagnosis not present

## 2021-08-04 DIAGNOSIS — E222 Syndrome of inappropriate secretion of antidiuretic hormone: Secondary | ICD-10-CM | POA: Diagnosis not present

## 2021-08-04 DIAGNOSIS — D513 Other dietary vitamin B12 deficiency anemia: Secondary | ICD-10-CM

## 2021-08-04 DIAGNOSIS — I10 Essential (primary) hypertension: Secondary | ICD-10-CM | POA: Diagnosis not present

## 2021-08-04 DIAGNOSIS — D508 Other iron deficiency anemias: Secondary | ICD-10-CM

## 2021-08-04 DIAGNOSIS — E269 Hyperaldosteronism, unspecified: Secondary | ICD-10-CM

## 2021-08-04 LAB — CBC WITH DIFFERENTIAL/PLATELET
Basophils Absolute: 0 10*3/uL (ref 0.0–0.1)
Basophils Relative: 0.7 % (ref 0.0–3.0)
Eosinophils Absolute: 0.2 10*3/uL (ref 0.0–0.7)
Eosinophils Relative: 2.7 % (ref 0.0–5.0)
HCT: 35.6 % — ABNORMAL LOW (ref 36.0–46.0)
Hemoglobin: 11.9 g/dL — ABNORMAL LOW (ref 12.0–15.0)
Lymphocytes Relative: 26.1 % (ref 12.0–46.0)
Lymphs Abs: 1.7 10*3/uL (ref 0.7–4.0)
MCHC: 33.3 g/dL (ref 30.0–36.0)
MCV: 90.2 fl (ref 78.0–100.0)
Monocytes Absolute: 0.6 10*3/uL (ref 0.1–1.0)
Monocytes Relative: 9.7 % (ref 3.0–12.0)
Neutro Abs: 3.9 10*3/uL (ref 1.4–7.7)
Neutrophils Relative %: 60.8 % (ref 43.0–77.0)
Platelets: 349 10*3/uL (ref 150.0–400.0)
RBC: 3.95 Mil/uL (ref 3.87–5.11)
RDW: 12.3 % (ref 11.5–15.5)
WBC: 6.4 10*3/uL (ref 4.0–10.5)

## 2021-08-04 LAB — BASIC METABOLIC PANEL
BUN: 13 mg/dL (ref 6–23)
CO2: 27 mEq/L (ref 19–32)
Calcium: 9.4 mg/dL (ref 8.4–10.5)
Chloride: 96 mEq/L (ref 96–112)
Creatinine, Ser: 0.54 mg/dL (ref 0.40–1.20)
GFR: 101.45 mL/min (ref >60.00)
Glucose, Bld: 85 mg/dL (ref 70–99)
Potassium: 4.2 mEq/L (ref 3.5–5.1)
Sodium: 133 mEq/L — ABNORMAL LOW (ref 135–145)

## 2021-08-04 LAB — URINALYSIS, ROUTINE W REFLEX MICROSCOPIC
Bilirubin Urine: NEGATIVE
Hgb urine dipstick: NEGATIVE
Ketones, ur: NEGATIVE
Nitrite: NEGATIVE
RBC / HPF: NONE SEEN (ref 0–?)
Specific Gravity, Urine: 1.015 (ref 1.000–1.030)
Urine Glucose: NEGATIVE
Urobilinogen, UA: 0.2 (ref 0.0–1.0)
pH: 8 (ref 5.0–8.0)

## 2021-08-04 LAB — IBC + FERRITIN
Ferritin: 31.1 ng/mL (ref 10.0–291.0)
Iron: 67 ug/dL (ref 42–145)
Saturation Ratios: 14.8 % — ABNORMAL LOW (ref 20.0–50.0)
TIBC: 453.6 ug/dL — ABNORMAL HIGH (ref 250.0–450.0)
Transferrin: 324 mg/dL (ref 212.0–360.0)

## 2021-08-04 LAB — LIPID PANEL
Cholesterol: 245 mg/dL — ABNORMAL HIGH (ref 0–200)
HDL: 62.3 mg/dL (ref >39.00)
LDL Cholesterol: 150 mg/dL — ABNORMAL HIGH (ref 0–99)
NonHDL: 182.96
Total CHOL/HDL Ratio: 4
Triglycerides: 163 mg/dL — ABNORMAL HIGH (ref 0.0–149.0)
VLDL: 32.6 mg/dL (ref 0.0–40.0)

## 2021-08-04 LAB — VITAMIN B12: Vitamin B-12: 277 pg/mL (ref 211–911)

## 2021-08-04 LAB — CORTISOL: Cortisol, Plasma: 8.4 ug/dL

## 2021-08-04 LAB — TSH: TSH: 1.66 u[IU]/mL (ref 0.35–5.50)

## 2021-08-04 LAB — MAGNESIUM: Magnesium: 2 mg/dL (ref 1.5–2.5)

## 2021-08-04 LAB — FOLATE: Folate: 6.3 ng/mL (ref >5.9)

## 2021-08-04 MED ORDER — SPIRONOLACTONE 25 MG PO TABS: 25.0000 mg | ORAL_TABLET | Freq: Every day | ORAL | 3 refills | Status: DC

## 2021-08-04 NOTE — Progress Notes (Addendum)
Subjective:  Patient ID: Cheryl Taylor, female    DOB: 07-31-1965  Age: 56 y.o. MRN: [id number]  CC: Annual Exam and Hypertension   HPI [name] presents for a CPX and f/up -  She has felt well recently and offers no complaints.  She tells me she is only taking demeclocycline about once a day.  She is compliant with the other antihypertensives.  She denies headache, blurred vision, chest pain, shortness of breath, dizziness, lightheadedness, or edema.  Outpatient Medications Prior to Visit  Medication Sig Dispense Refill   amLODipine (NORVASC) 10 MG tablet TAKE 1 TABLET BY MOUTH EVERY DAY 90 tablet 0   Armodafinil 250 MG tablet TAKE 1 TABLET BY MOUTH EVERY DAY 90 tablet 0   demeclocycline (DECLOMYCIN) 150 MG tablet TAKE 1 TABLET BY MOUTH TWICE A DAY 180 tablet 0   escitalopram (LEXAPRO) 20 MG tablet TAKE 1 TABLET BY MOUTH EVERY DAY 90 tablet 0   nebivolol (BYSTOLIC) 10 MG tablet TAKE 1 TABLET BY MOUTH EVERY DAY 90 tablet 0   torsemide (DEMADEX) 10 MG tablet TAKE 1 TABLET BY MOUTH EVERY DAY 90 tablet 0   traZODone (DESYREL) 50 MG tablet Take 0.5-1 tablets (25-50 mg total) by mouth as needed. 180 tablet 1   valACYclovir (VALTREX) 1000 MG tablet Take 1 tablet by mouth daily 90 tablet 0   KLOR-CON M20 20 MEQ tablet TAKE 1 TABLET BY MOUTH TWICE A DAY 180 tablet 1   No facility-administered medications prior to visit.    ROS Review of Systems  Constitutional:  Negative for chills, diaphoresis, fatigue and fever.  HENT: Negative.    Eyes: Negative.   Respiratory:  Negative for cough, chest tightness, shortness of breath and wheezing.   Cardiovascular:  Negative for chest pain, palpitations and leg swelling.  Gastrointestinal:  Negative for abdominal pain, blood in stool, constipation, diarrhea, nausea and vomiting.  Endocrine: Negative.   Genitourinary: Negative.  Negative for difficulty urinating.  Musculoskeletal: Negative.   Skin: Negative.   Neurological:  Negative for  dizziness, weakness and light-headedness.  Hematological:  Negative for adenopathy. Does not bruise/bleed easily.  Psychiatric/Behavioral: Negative.      Objective:  BP (!) 180/88 (BP Location: Left Arm, Patient Position: Sitting) Comment: BP (R) 180/84 (L) 180/88  Pulse 70   Temp 98.5 F (36.9 C) (Oral)   Resp 16   Ht '5\' 4"'$  (1.626 m)   Wt 195 lb (88.5 kg)   LMP 11/13/2017   SpO2 99%   BMI 33.47 kg/m   BP Readings from Last 3 Encounters:  08/04/21 (!) 180/88  03/24/21 (!) 163/86  01/20/21 133/83    Wt Readings from Last 3 Encounters:  08/04/21 195 lb (88.5 kg)  03/24/21 191 lb (86.6 kg)  01/20/21 191 lb 8 oz (86.9 kg)    Physical Exam Vitals reviewed.  HENT:     Nose: Nose normal.     Mouth/Throat:     Mouth: Mucous membranes are moist.  Eyes:     General: No scleral icterus.    Conjunctiva/sclera: Conjunctivae normal.  Cardiovascular:     Rate and Rhythm: Normal rate and regular rhythm.     Heart sounds: No murmur heard. Pulmonary:     Breath sounds: No stridor. No wheezing, rhonchi or rales.  Abdominal:     General: Abdomen is flat.     Palpations: There is no mass.     Tenderness: There is no abdominal tenderness. There is no guarding.  Hernia: No hernia is present.  Musculoskeletal:        General: Normal range of motion.     Cervical back: Neck supple.     Right lower leg: No edema.     Left lower leg: No edema.  Lymphadenopathy:     Cervical: No cervical adenopathy.  Skin:    General: Skin is warm and dry.  Neurological:     General: No focal deficit present.     Mental Status: She is alert.  Psychiatric:        Mood and Affect: Mood normal.        Behavior: Behavior normal.     Lab Results  Component Value Date   WBC 6.4 08/04/2021   HGB 11.9 (L) 08/04/2021   HCT 35.6 (L) 08/04/2021   PLT 349.0 08/04/2021   GLUCOSE 85 08/04/2021   CHOL 245 (H) 08/04/2021   TRIG 163.0 (H) 08/04/2021   HDL 62.30 08/04/2021   LDLDIRECT 149.0  07/30/2019   LDLCALC 150 (H) 08/04/2021   ALT 19 12/08/2020   AST 17 12/08/2020   NA 133 (L) 08/04/2021   K 4.2 08/04/2021   CL 96 08/04/2021   CREATININE 0.54 08/04/2021   BUN 13 08/04/2021   CO2 27 08/04/2021   TSH 1.66 08/04/2021   HGBA1C 5.9 12/08/2020    CT HEAD WO CONTRAST (5MM)  Result Date: 12/31/2020 CLINICAL DATA:  56 year old female with acute neurologic deficit. EXAM: CT HEAD WITHOUT CONTRAST TECHNIQUE: Contiguous axial images were obtained from the base of the skull through the vertex without intravenous contrast. COMPARISON:  Brain MRI 09/10/2017. FINDINGS: Brain: Cerebral volume is stable and within normal limits for age. Small but conspicuous asymmetric hypodensity in the right thalamus on series 2, image 18, in an area that was normal on the 2019 MRI. Elsewhere normal gray-white matter differentiation. No midline shift, ventriculomegaly, mass effect, evidence of mass lesion, intracranial hemorrhage or evidence of cortically based acute infarction. Vascular: Mild Calcified atherosclerosis at the skull base. No suspicious intracranial vascular hyperdensity. Skull: Negative. Sinuses/Orbits: Visualized paranasal sinuses and mastoids are clear. Other: Visualized orbits and scalp soft tissues are within normal limits. IMPRESSION: 1. 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 Query left side symptoms. 2. Otherwise negative for age noncontrast CT appearance of the brain. Electronically Signed   By: Genevie Ann M.D.   On: 12/31/2020 10:38    Assessment & Plan:   Danniela was seen today for annual exam and hypertension.  Diagnoses and all orders for this visit:  Other dietary vitamin B12 deficiency anemia- Her vitamin levels are normal.  Will continue to follow. -     CBC with Differential/Platelet; Future -     Folate; Future -     Vitamin B12; Future -     Vitamin B12 -     Folate -     CBC with Differential/Platelet  Iron  deficiency anemia secondary to inadequate dietary iron intake- She is mildly anemic.  Her iron level is normal. -     IBC + Ferritin; Future -     CBC with Differential/Platelet; Future -     CBC with Differential/Platelet -     IBC + Ferritin  Essential hypertension- Her blood pressure is not adequately well controlled.  Will evaluate for secondary causes and endorgan damage.  Will empirically treat for hyperaldosteronism. -     Basic metabolic panel; Future -     Aldosterone +  renin activity w/ ratio; Future -     CBC with Differential/Platelet; Future -     TSH; Future -     Urinalysis, Routine w reflex microscopic; Future -     Cortisol; Future -     Magnesium; Future -     Magnesium -     Cortisol -     Urinalysis, Routine w reflex microscopic -     TSH -     CBC with Differential/Platelet -     Aldosterone + renin activity w/ ratio -     Basic metabolic panel -     spironolactone (ALDACTONE) 25 MG tablet; Take 1 tablet (25 mg total) by mouth daily.  SIADH (syndrome of inappropriate ADH production) ([place])- Improvement noted.  Will continue demeclocycline. -     Basic metabolic panel; Future -     Sodium, urine, random; Future -     Sodium, urine, random -     Basic metabolic panel  Encounter for general adult medical examination with abnormal findings- Exam completed, labs reviewed-statin therapy is not indicated, vaccines are up-to-date, cancer screenings addressed, patient education was given. -     Lipid panel; Future -     Lipid panel   I have discontinued Anderson Malta A. Negro's Klor-Con M20. I am also having her start on spironolactone. Additionally, I am having her maintain her traZODone, nebivolol, valACYclovir, Armodafinil, demeclocycline, torsemide, escitalopram, and amLODipine.  Meds ordered this encounter  Medications   spironolactone (ALDACTONE) 25 MG tablet    Sig: Take 1 tablet (25 mg total) by mouth daily.    Dispense:  90 tablet    Refill:  3      Follow-up: Return in about 4 weeks (around 09/01/2021).  [name], MD

## 2021-08-04 NOTE — Patient Instructions (Signed)
Hypertension, Adult High blood pressure (hypertension) is when the force of blood pumping through the arteries is too strong. The arteries are the blood vessels that carry blood from the heart throughout the body. Hypertension forces the heart to work harder to pump blood and may cause arteries to become narrow or stiff. Untreated or uncontrolled hypertension can lead to a heart attack, heart failure, a stroke, kidney disease, and other problems. A blood pressure reading consists of a higher number over a lower number. Ideally, your blood pressure should be below 120/80. The first ("top") number is called the systolic pressure. It is a measure of the pressure in your arteries as your heart beats. The second ("bottom") number is called the diastolic pressure. It is a measure of the pressure in your arteries as the heart relaxes. What are the causes? The exact cause of this condition is not known. There are some conditions that result in high blood pressure. What increases the risk? Certain factors may make you more likely to develop high blood pressure. Some of these risk factors are under your control, including: Smoking. Not getting enough exercise or physical activity. Being overweight. Having too much fat, sugar, calories, or salt (sodium) in your diet. Drinking too much alcohol. Other risk factors include: Having a personal history of heart disease, diabetes, high cholesterol, or kidney disease. Stress. Having a family history of high blood pressure and high cholesterol. Having obstructive sleep apnea. Age. The risk increases with age. What are the signs or symptoms? High blood pressure may not cause symptoms. Very high blood pressure (hypertensive crisis) may cause: Headache. Fast or irregular heartbeats (palpitations). Shortness of breath. Nosebleed. Nausea and vomiting. Vision changes. Severe chest pain, dizziness, and seizures. How is this diagnosed? This condition is diagnosed by  measuring your blood pressure while you are seated, with your arm resting on a flat surface, your legs uncrossed, and your feet flat on the floor. The cuff of the blood pressure monitor will be placed directly against the skin of your upper arm at the level of your heart. Blood pressure should be measured at least twice using the same arm. Certain conditions can cause a difference in blood pressure between your right and left arms. If you have a high blood pressure reading during one visit or you have normal blood pressure with other risk factors, you may be asked to: Return on a different day to have your blood pressure checked again. Monitor your blood pressure at home for 1 week or longer. If you are diagnosed with hypertension, you may have other blood or imaging tests to help your health care provider understand your overall risk for other conditions. How is this treated? This condition is treated by making healthy lifestyle changes, such as eating healthy foods, exercising more, and reducing your alcohol intake. You may be referred for counseling on a healthy diet and physical activity. Your health care provider may prescribe medicine if lifestyle changes are not enough to get your blood pressure under control and if: Your systolic blood pressure is above 130. Your diastolic blood pressure is above 80. Your personal target blood pressure may vary depending on your medical conditions, your age, and other factors. Follow these instructions at home: Eating and drinking  Eat a diet that is high in fiber and potassium, and low in sodium, added sugar, and fat. An example of this eating plan is called the DASH diet. DASH stands for Dietary Approaches to Stop Hypertension. To eat this way: Eat   plenty of fresh fruits and vegetables. Try to fill one half of your plate at each meal with fruits and vegetables. Eat whole grains, such as whole-wheat pasta, brown rice, or whole-grain bread. Fill about one  fourth of your plate with whole grains. Eat or drink low-fat dairy products, such as skim milk or low-fat yogurt. Avoid fatty cuts of meat, processed or cured meats, and poultry with skin. Fill about one fourth of your plate with lean proteins, such as fish, chicken without skin, beans, eggs, or tofu. Avoid pre-made and processed foods. These tend to be higher in sodium, added sugar, and fat. Reduce your daily sodium intake. Many people with hypertension should eat less than 1,500 mg of sodium a day. Do not drink alcohol if: Your health care provider tells you not to drink. You are pregnant, may be pregnant, or are planning to become pregnant. If you drink alcohol: Limit how much you have to: 0-1 drink a day for women. 0-2 drinks a day for men. Know how much alcohol is in your drink. In the [place] one drink equals one 12 oz bottle of beer (355 mL), one 5 oz glass of wine (148 mL), or one 1 oz glass of hard liquor (44 mL). Lifestyle  Work with your health care provider to maintain a healthy body weight or to lose weight. Ask what an ideal weight is for you. Get at least 30 minutes of exercise that causes your heart to beat faster (aerobic exercise) most days of the week. Activities may include walking, swimming, or biking. Include exercise to strengthen your muscles (resistance exercise), such as Pilates or lifting weights, as part of your weekly exercise routine. Try to do these types of exercises for 30 minutes at least 3 days a week. Do not use any products that contain nicotine or tobacco. These products include cigarettes, chewing tobacco, and vaping devices, such as e-cigarettes. If you need help quitting, ask your health care provider. Monitor your blood pressure at home as told by your health care provider. Keep all follow-up visits. This is important. Medicines Take over-the-counter and prescription medicines only as told by your health care provider. Follow directions carefully. Blood  pressure medicines must be taken as prescribed. Do not skip doses of blood pressure medicine. Doing this puts you at risk for problems and can make the medicine less effective. Ask your health care provider about side effects or reactions to medicines that you should watch for. Contact a health care provider if you: Think you are having a reaction to a medicine you are taking. Have headaches that keep coming back (recurring). Feel dizzy. Have swelling in your ankles. Have trouble with your vision. Get help right away if you: Develop a severe headache or confusion. Have unusual weakness or numbness. Feel faint. Have severe pain in your chest or abdomen. Vomit repeatedly. Have trouble breathing. These symptoms may be an emergency. Get help right away. Call 911. Do not wait to see if the symptoms will go away. Do not drive yourself to the hospital. Summary Hypertension is when the force of blood pumping through your arteries is too strong. If this condition is not controlled, it may put you at risk for serious complications. Your personal target blood pressure may vary depending on your medical conditions, your age, and other factors. For most people, a normal blood pressure is less than 120/80. Hypertension is treated with lifestyle changes, medicines, or a combination of both. Lifestyle changes include losing weight, eating a healthy,   low-sodium diet, exercising more, and limiting alcohol. This information is not intended to replace advice given to you by your health care provider. Make sure you discuss any questions you have with your health care provider. Document Revised: 11/24/2020 Document Reviewed: 11/24/2020 Elsevier Patient Education  2023 [employer]  

## 2021-08-05 ENCOUNTER — Other Ambulatory Visit: Payer: Self-pay | Admitting: Internal Medicine

## 2021-08-05 DIAGNOSIS — I10 Essential (primary) hypertension: Secondary | ICD-10-CM

## 2021-08-05 DIAGNOSIS — E876 Hypokalemia: Secondary | ICD-10-CM

## 2021-08-14 LAB — ALDOSTERONE + RENIN ACTIVITY W/ RATIO
ALDO / PRA Ratio: 20.3 Ratio (ref 0.9–28.9)
Aldosterone: 12 ng/dL
Renin Activity: 0.59 ng/mL/h (ref 0.25–5.82)

## 2021-08-14 LAB — SODIUM, URINE, RANDOM: Sodium, Ur: 132 mmol/L (ref 28–272)

## 2021-08-15 ENCOUNTER — Encounter (INDEPENDENT_AMBULATORY_CARE_PROVIDER_SITE_OTHER): Payer: BC Managed Care – PPO | Admitting: Internal Medicine

## 2021-08-15 DIAGNOSIS — L089 Local infection of the skin and subcutaneous tissue, unspecified: Secondary | ICD-10-CM

## 2021-08-15 DIAGNOSIS — T22039A Burn of unspecified degree of unspecified upper arm, initial encounter: Secondary | ICD-10-CM | POA: Diagnosis not present

## 2021-08-15 DIAGNOSIS — E269 Hyperaldosteronism, unspecified: Secondary | ICD-10-CM | POA: Insufficient documentation

## 2021-08-17 ENCOUNTER — Encounter (INDEPENDENT_AMBULATORY_CARE_PROVIDER_SITE_OTHER): Payer: Self-pay

## 2021-08-17 ENCOUNTER — Other Ambulatory Visit: Payer: Self-pay | Admitting: Internal Medicine

## 2021-08-17 DIAGNOSIS — L089 Local infection of the skin and subcutaneous tissue, unspecified: Secondary | ICD-10-CM | POA: Insufficient documentation

## 2021-08-17 MED ORDER — AMOXICILLIN-POT CLAVULANATE 875-125 MG PO TABS: 1.0000 | ORAL_TABLET | Freq: Two times a day (BID) | ORAL | 0 refills | Status: AC

## 2021-08-17 NOTE — Telephone Encounter (Signed)
Please see the MyChart message reply(ies) for my assessment and plan.  The patient gave consent for this Medical Advice Message and is aware that it may result in a bill to their insurance company as well as the possibility that this may result in a co-payment or deductible. They are an established patient, but are not seeking medical advice exclusively about a problem treated during an in person or video visit in the last 7 days. I did not recommend an in person or video visit within 7 days of my reply.  I spent a total of 10 minutes cumulative time within 7 days through MyChart messaging Shariq Puig, MD  

## 2021-08-19 ENCOUNTER — Ambulatory Visit: Payer: BC Managed Care – PPO | Admitting: Internal Medicine

## 2021-08-23 ENCOUNTER — Ambulatory Visit: Payer: BC Managed Care – PPO

## 2021-08-27 ENCOUNTER — Encounter: Payer: Self-pay | Admitting: Internal Medicine

## 2021-08-30 ENCOUNTER — Other Ambulatory Visit: Payer: Self-pay | Admitting: Internal Medicine

## 2021-08-30 DIAGNOSIS — Z1231 Encounter for screening mammogram for malignant neoplasm of breast: Secondary | ICD-10-CM

## 2021-09-01 ENCOUNTER — Ambulatory Visit: Payer: BC Managed Care – PPO | Admitting: Internal Medicine

## 2021-09-08 ENCOUNTER — Ambulatory Visit: Payer: BC Managed Care – PPO

## 2021-09-10 ENCOUNTER — Encounter (HOSPITAL_BASED_OUTPATIENT_CLINIC_OR_DEPARTMENT_OTHER): Payer: BC Managed Care – PPO | Admitting: Radiology

## 2021-09-10 DIAGNOSIS — Z1231 Encounter for screening mammogram for malignant neoplasm of breast: Secondary | ICD-10-CM

## 2021-09-21 ENCOUNTER — Encounter: Payer: Self-pay | Admitting: Internal Medicine

## 2021-09-21 ENCOUNTER — Ambulatory Visit: Payer: BC Managed Care – PPO | Admitting: Internal Medicine

## 2021-09-21 ENCOUNTER — Other Ambulatory Visit: Payer: Self-pay

## 2021-09-21 VITALS — BP 168/84 | HR 64 | Temp 98.0°F | Resp 16 | Ht 64.0 in | Wt 192.0 lb

## 2021-09-21 DIAGNOSIS — D508 Other iron deficiency anemias: Secondary | ICD-10-CM | POA: Diagnosis not present

## 2021-09-21 DIAGNOSIS — D513 Other dietary vitamin B12 deficiency anemia: Secondary | ICD-10-CM | POA: Diagnosis not present

## 2021-09-21 DIAGNOSIS — I10 Essential (primary) hypertension: Secondary | ICD-10-CM | POA: Diagnosis not present

## 2021-09-21 LAB — CBC WITH DIFFERENTIAL/PLATELET
Basophils Absolute: 0 10*3/uL (ref 0.0–0.1)
Basophils Relative: 0.8 % (ref 0.0–3.0)
Eosinophils Absolute: 0.1 10*3/uL (ref 0.0–0.7)
Eosinophils Relative: 1.8 % (ref 0.0–5.0)
HCT: 35.5 % — ABNORMAL LOW (ref 36.0–46.0)
Hemoglobin: 11.8 g/dL — ABNORMAL LOW (ref 12.0–15.0)
Lymphocytes Relative: 27.9 % (ref 12.0–46.0)
Lymphs Abs: 1.7 10*3/uL (ref 0.7–4.0)
MCHC: 33.3 g/dL (ref 30.0–36.0)
MCV: 90 fl (ref 78.0–100.0)
Monocytes Absolute: 0.5 10*3/uL (ref 0.1–1.0)
Monocytes Relative: 8.8 % (ref 3.0–12.0)
Neutro Abs: 3.7 10*3/uL (ref 1.4–7.7)
Neutrophils Relative %: 60.7 % (ref 43.0–77.0)
Platelets: 302 10*3/uL (ref 150.0–400.0)
RBC: 3.94 Mil/uL (ref 3.87–5.11)
RDW: 13.3 % (ref 11.5–15.5)
WBC: 6.1 10*3/uL (ref 4.0–10.5)

## 2021-09-21 LAB — VITAMIN B12: Vitamin B-12: 224 pg/mL (ref 211–911)

## 2021-09-21 NOTE — Progress Notes (Unsigned)
Subjective:  Patient ID: Cheryl Taylor, female    DOB: 1965-09-17  Age: 56 y.o. MRN: [id number]  CC: No chief complaint on file.   HPI Cheryl Taylor presents for ***  Outpatient Medications Prior to Visit  Medication Sig Dispense Refill   amLODipine (NORVASC) 10 MG tablet TAKE 1 TABLET BY MOUTH EVERY DAY 90 tablet 0   Armodafinil 250 MG tablet TAKE 1 TABLET BY MOUTH EVERY DAY 90 tablet 0   demeclocycline (DECLOMYCIN) 150 MG tablet TAKE 1 TABLET BY MOUTH TWICE A DAY 180 tablet 0   escitalopram (LEXAPRO) 20 MG tablet TAKE 1 TABLET BY MOUTH EVERY DAY 90 tablet 0   nebivolol (BYSTOLIC) 10 MG tablet TAKE 1 TABLET BY MOUTH EVERY DAY 90 tablet 0   spironolactone (ALDACTONE) 25 MG tablet Take 1 tablet (25 mg total) by mouth daily. 90 tablet 3   torsemide (DEMADEX) 10 MG tablet TAKE 1 TABLET BY MOUTH EVERY DAY 90 tablet 0   traZODone (DESYREL) 50 MG tablet Take 0.5-1 tablets (25-50 mg total) by mouth as needed. 180 tablet 1   valACYclovir (VALTREX) 1000 MG tablet Take 1 tablet by mouth daily 90 tablet 0   No facility-administered medications prior to visit.    ROS Review of Systems  Objective:  LMP 11/13/2017   BP Readings from Last 3 Encounters:  08/04/21 (!) 180/88  03/24/21 (!) 163/86  01/20/21 133/83    Wt Readings from Last 3 Encounters:  08/04/21 195 lb (88.5 kg)  03/24/21 191 lb (86.6 kg)  01/20/21 191 lb 8 oz (86.9 kg)    Physical Exam  Lab Results  Component Value Date   WBC 6.4 08/04/2021   HGB 11.9 (L) 08/04/2021   HCT 35.6 (L) 08/04/2021   PLT 349.0 08/04/2021   GLUCOSE 85 08/04/2021   CHOL 245 (H) 08/04/2021   TRIG 163.0 (H) 08/04/2021   HDL 62.30 08/04/2021   LDLDIRECT 149.0 07/30/2019   LDLCALC 150 (H) 08/04/2021   ALT 19 12/08/2020   AST 17 12/08/2020   NA 133 (L) 08/04/2021   K 4.2 08/04/2021   CL 96 08/04/2021   CREATININE 0.54 08/04/2021   BUN 13 08/04/2021   CO2 27 08/04/2021   TSH 1.66 08/04/2021   HGBA1C 5.9 12/08/2020    CT  HEAD WO CONTRAST (5MM)  Result Date: 12/31/2020 CLINICAL DATA:  56 year old female with acute neurologic deficit. EXAM: CT HEAD WITHOUT CONTRAST TECHNIQUE: Contiguous axial images were obtained from the base of the skull through the vertex without intravenous contrast. COMPARISON:  Brain MRI 09/10/2017. FINDINGS: Brain: Cerebral volume is stable and within normal limits for age. Small but conspicuous asymmetric hypodensity in the right thalamus on series 2, image 18, in an area that was normal on the 2019 MRI. Elsewhere normal gray-white matter differentiation. No midline shift, ventriculomegaly, mass effect, evidence of mass lesion, intracranial hemorrhage or evidence of cortically based acute infarction. Vascular: Mild Calcified atherosclerosis at the skull base. No suspicious intracranial vascular hyperdensity. Skull: Negative. Sinuses/Orbits: Visualized paranasal sinuses and mastoids are clear. Other: Visualized orbits and scalp soft tissues are within normal limits. IMPRESSION: 1. 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 Query left side symptoms. 2. Otherwise negative for age noncontrast CT appearance of the brain. Electronically Signed   By: Genevie Ann M.D.   On: 12/31/2020 10:38    Assessment & Plan:   There are no diagnoses linked to this encounter. I am having  Cheryl Taylor maintain her traZODone, nebivolol, valACYclovir, Armodafinil, demeclocycline, torsemide, escitalopram, amLODipine, and spironolactone.  No orders of the defined types were placed in this encounter.    Follow-up: No follow-ups on file.  [name], MD

## 2021-09-21 NOTE — Patient Instructions (Signed)
Hypertension, Adult High blood pressure (hypertension) is when the force of blood pumping through the arteries is too strong. The arteries are the blood vessels that carry blood from the heart throughout the body. Hypertension forces the heart to work harder to pump blood and may cause arteries to become narrow or stiff. Untreated or uncontrolled hypertension can lead to a heart attack, heart failure, a stroke, kidney disease, and other problems. A blood pressure reading consists of a higher number over a lower number. Ideally, your blood pressure should be below 120/80. The first ("top") number is called the systolic pressure. It is a measure of the pressure in your arteries as your heart beats. The second ("bottom") number is called the diastolic pressure. It is a measure of the pressure in your arteries as the heart relaxes. What are the causes? The exact cause of this condition is not known. There are some conditions that result in high blood pressure. What increases the risk? Certain factors may make you more likely to develop high blood pressure. Some of these risk factors are under your control, including: Smoking. Not getting enough exercise or physical activity. Being overweight. Having too much fat, sugar, calories, or salt (sodium) in your diet. Drinking too much alcohol. Other risk factors include: Having a personal history of heart disease, diabetes, high cholesterol, or kidney disease. Stress. Having a family history of high blood pressure and high cholesterol. Having obstructive sleep apnea. Age. The risk increases with age. What are the signs or symptoms? High blood pressure may not cause symptoms. Very high blood pressure (hypertensive crisis) may cause: Headache. Fast or irregular heartbeats (palpitations). Shortness of breath. Nosebleed. Nausea and vomiting. Vision changes. Severe chest pain, dizziness, and seizures. How is this diagnosed? This condition is diagnosed by  measuring your blood pressure while you are seated, with your arm resting on a flat surface, your legs uncrossed, and your feet flat on the floor. The cuff of the blood pressure monitor will be placed directly against the skin of your upper arm at the level of your heart. Blood pressure should be measured at least twice using the same arm. Certain conditions can cause a difference in blood pressure between your right and left arms. If you have a high blood pressure reading during one visit or you have normal blood pressure with other risk factors, you may be asked to: Return on a different day to have your blood pressure checked again. Monitor your blood pressure at home for 1 week or longer. If you are diagnosed with hypertension, you may have other blood or imaging tests to help your health care provider understand your overall risk for other conditions. How is this treated? This condition is treated by making healthy lifestyle changes, such as eating healthy foods, exercising more, and reducing your alcohol intake. You may be referred for counseling on a healthy diet and physical activity. Your health care provider may prescribe medicine if lifestyle changes are not enough to get your blood pressure under control and if: Your systolic blood pressure is above 130. Your diastolic blood pressure is above 80. Your personal target blood pressure may vary depending on your medical conditions, your age, and other factors. Follow these instructions at home: Eating and drinking  Eat a diet that is high in fiber and potassium, and low in sodium, added sugar, and fat. An example of this eating plan is called the DASH diet. DASH stands for Dietary Approaches to Stop Hypertension. To eat this way: Eat   plenty of fresh fruits and vegetables. Try to fill one half of your plate at each meal with fruits and vegetables. Eat whole grains, such as whole-wheat pasta, brown rice, or whole-grain bread. Fill about one  fourth of your plate with whole grains. Eat or drink low-fat dairy products, such as skim milk or low-fat yogurt. Avoid fatty cuts of meat, processed or cured meats, and poultry with skin. Fill about one fourth of your plate with lean proteins, such as fish, chicken without skin, beans, eggs, or tofu. Avoid pre-made and processed foods. These tend to be higher in sodium, added sugar, and fat. Reduce your daily sodium intake. Many people with hypertension should eat less than 1,500 mg of sodium a day. Do not drink alcohol if: Your health care provider tells you not to drink. You are pregnant, may be pregnant, or are planning to become pregnant. If you drink alcohol: Limit how much you have to: 0-1 drink a day for women. 0-2 drinks a day for men. Know how much alcohol is in your drink. In the [place] one drink equals one 12 oz bottle of beer (355 mL), one 5 oz glass of wine (148 mL), or one 1 oz glass of hard liquor (44 mL). Lifestyle  Work with your health care provider to maintain a healthy body weight or to lose weight. Ask what an ideal weight is for you. Get at least 30 minutes of exercise that causes your heart to beat faster (aerobic exercise) most days of the week. Activities may include walking, swimming, or biking. Include exercise to strengthen your muscles (resistance exercise), such as Pilates or lifting weights, as part of your weekly exercise routine. Try to do these types of exercises for 30 minutes at least 3 days a week. Do not use any products that contain nicotine or tobacco. These products include cigarettes, chewing tobacco, and vaping devices, such as e-cigarettes. If you need help quitting, ask your health care provider. Monitor your blood pressure at home as told by your health care provider. Keep all follow-up visits. This is important. Medicines Take over-the-counter and prescription medicines only as told by your health care provider. Follow directions carefully. Blood  pressure medicines must be taken as prescribed. Do not skip doses of blood pressure medicine. Doing this puts you at risk for problems and can make the medicine less effective. Ask your health care provider about side effects or reactions to medicines that you should watch for. Contact a health care provider if you: Think you are having a reaction to a medicine you are taking. Have headaches that keep coming back (recurring). Feel dizzy. Have swelling in your ankles. Have trouble with your vision. Get help right away if you: Develop a severe headache or confusion. Have unusual weakness or numbness. Feel faint. Have severe pain in your chest or abdomen. Vomit repeatedly. Have trouble breathing. These symptoms may be an emergency. Get help right away. Call 911. Do not wait to see if the symptoms will go away. Do not drive yourself to the hospital. Summary Hypertension is when the force of blood pumping through your arteries is too strong. If this condition is not controlled, it may put you at risk for serious complications. Your personal target blood pressure may vary depending on your medical conditions, your age, and other factors. For most people, a normal blood pressure is less than 120/80. Hypertension is treated with lifestyle changes, medicines, or a combination of both. Lifestyle changes include losing weight, eating a healthy,   low-sodium diet, exercising more, and limiting alcohol. This information is not intended to replace advice given to you by your health care provider. Make sure you discuss any questions you have with your health care provider. Document Revised: 11/24/2020 Document Reviewed: 11/24/2020 Elsevier Patient Education  2023 [employer]  

## 2021-09-22 LAB — IBC + FERRITIN
Ferritin: 28.1 ng/mL (ref 10.0–291.0)
Iron: 73 ug/dL (ref 42–145)
Saturation Ratios: 15.6 % — ABNORMAL LOW (ref 20.0–50.0)
TIBC: 467.6 ug/dL — ABNORMAL HIGH (ref 250.0–450.0)
Transferrin: 334 mg/dL (ref 212.0–360.0)

## 2021-09-22 MED ORDER — HYDRALAZINE HCL 25 MG PO TABS: 25.0000 mg | ORAL_TABLET | Freq: Three times a day (TID) | ORAL | 0 refills | Status: DC

## 2021-09-24 ENCOUNTER — Other Ambulatory Visit: Payer: Self-pay | Admitting: Internal Medicine

## 2021-09-24 ENCOUNTER — Ambulatory Visit (HOSPITAL_BASED_OUTPATIENT_CLINIC_OR_DEPARTMENT_OTHER)
Admission: RE | Admit: 2021-09-24 | Discharge: 2021-09-24 | Disposition: A | Discharge status: Home / Self Care | Payer: BC Managed Care – PPO | Source: Ambulatory Visit | Attending: Radiology | Admitting: Radiology

## 2021-09-24 DIAGNOSIS — E222 Syndrome of inappropriate secretion of antidiuretic hormone: Secondary | ICD-10-CM

## 2021-09-24 DIAGNOSIS — Z1231 Encounter for screening mammogram for malignant neoplasm of breast: Secondary | ICD-10-CM | POA: Diagnosis not present

## 2021-09-24 DIAGNOSIS — I10 Essential (primary) hypertension: Secondary | ICD-10-CM

## 2021-09-27 ENCOUNTER — Other Ambulatory Visit: Payer: Self-pay | Admitting: Neurology

## 2021-09-27 DIAGNOSIS — G4710 Hypersomnia, unspecified: Secondary | ICD-10-CM

## 2021-09-27 DIAGNOSIS — G4733 Obstructive sleep apnea (adult) (pediatric): Secondary | ICD-10-CM

## 2021-09-29 ENCOUNTER — Other Ambulatory Visit: Payer: BC Managed Care – PPO

## 2021-09-30 NOTE — Telephone Encounter (Signed)
Verify Drug Registry For Armodafinil 250 MG tablet Last Filled: 06/24/2021 Quantity: 90 tablets for 90 days Last appointment: 03/24/2021 Next appointment: N/A

## 2021-10-14 ENCOUNTER — Ambulatory Visit
Admission: RE | Admit: 2021-10-14 | Discharge: 2021-10-14 | Disposition: A | Discharge status: Home / Self Care | Payer: BC Managed Care – PPO | Source: Ambulatory Visit | Attending: Internal Medicine | Admitting: Internal Medicine

## 2021-10-14 DIAGNOSIS — I10 Essential (primary) hypertension: Secondary | ICD-10-CM

## 2021-10-14 DIAGNOSIS — N2889 Other specified disorders of kidney and ureter: Secondary | ICD-10-CM | POA: Diagnosis not present

## 2021-10-24 ENCOUNTER — Other Ambulatory Visit: Payer: Self-pay | Admitting: Internal Medicine

## 2021-10-24 DIAGNOSIS — F331 Major depressive disorder, recurrent, moderate: Secondary | ICD-10-CM

## 2021-10-24 DIAGNOSIS — I10 Essential (primary) hypertension: Secondary | ICD-10-CM

## 2021-10-28 ENCOUNTER — Other Ambulatory Visit: Payer: Self-pay | Admitting: Internal Medicine

## 2021-10-28 DIAGNOSIS — E222 Syndrome of inappropriate secretion of antidiuretic hormone: Secondary | ICD-10-CM

## 2021-12-02 ENCOUNTER — Ambulatory Visit (INDEPENDENT_AMBULATORY_CARE_PROVIDER_SITE_OTHER): Payer: BC Managed Care – PPO

## 2021-12-02 DIAGNOSIS — Z23 Encounter for immunization: Secondary | ICD-10-CM | POA: Diagnosis not present

## 2021-12-27 ENCOUNTER — Other Ambulatory Visit: Payer: Self-pay | Admitting: Neurology

## 2021-12-27 DIAGNOSIS — G4710 Hypersomnia, unspecified: Secondary | ICD-10-CM

## 2021-12-27 DIAGNOSIS — G4733 Obstructive sleep apnea (adult) (pediatric): Secondary | ICD-10-CM

## 2021-12-27 NOTE — Telephone Encounter (Signed)
Please call patient and schedule an appointment for further refills of armodafinil. Please [place] know when this has been completed so that we can address the armodafinil refill.

## 2021-12-28 ENCOUNTER — Ambulatory Visit: Payer: BC Managed Care – PPO | Admitting: Internal Medicine

## 2021-12-29 ENCOUNTER — Ambulatory Visit: Payer: BC Managed Care – PPO | Admitting: Adult Health

## 2021-12-30 NOTE — Telephone Encounter (Signed)
Pt returned my called. She schedule an appointment for 2/28 @ 2:30 pm with Dr. Brett Fairy. Pt is requesting enough medication until her appointment.

## 2022-01-01 ENCOUNTER — Other Ambulatory Visit: Payer: Self-pay | Admitting: Neurology

## 2022-01-01 DIAGNOSIS — G4710 Hypersomnia, unspecified: Secondary | ICD-10-CM

## 2022-01-01 DIAGNOSIS — G4733 Obstructive sleep apnea (adult) (pediatric): Secondary | ICD-10-CM

## 2022-01-03 MED ORDER — ARMODAFINIL 250 MG PO TABS: 250.0000 mg | ORAL_TABLET | Freq: Every day | ORAL | 0 refills | Status: DC

## 2022-01-13 ENCOUNTER — Ambulatory Visit: Payer: BC Managed Care – PPO | Admitting: Internal Medicine

## 2022-01-19 ENCOUNTER — Other Ambulatory Visit: Payer: Self-pay | Admitting: Internal Medicine

## 2022-01-19 DIAGNOSIS — F331 Major depressive disorder, recurrent, moderate: Secondary | ICD-10-CM

## 2022-01-29 ENCOUNTER — Encounter: Payer: Self-pay | Admitting: Internal Medicine

## 2022-02-01 ENCOUNTER — Other Ambulatory Visit: Payer: Self-pay | Admitting: Internal Medicine

## 2022-02-01 DIAGNOSIS — Z20828 Contact with and (suspected) exposure to other viral communicable diseases: Secondary | ICD-10-CM | POA: Insufficient documentation

## 2022-02-01 MED ORDER — OSELTAMIVIR PHOSPHATE 75 MG PO CAPS: 75.0000 mg | ORAL_CAPSULE | Freq: Every day | ORAL | 0 refills | Status: AC

## 2022-02-03 ENCOUNTER — Encounter: Payer: Self-pay | Admitting: Internal Medicine

## 2022-02-03 ENCOUNTER — Other Ambulatory Visit: Payer: Self-pay | Admitting: Internal Medicine

## 2022-02-03 DIAGNOSIS — U071 COVID-19: Secondary | ICD-10-CM | POA: Insufficient documentation

## 2022-02-03 MED ORDER — NIRMATRELVIR/RITONAVIR (PAXLOVID)TABLET: 3.0000 | ORAL_TABLET | Freq: Two times a day (BID) | ORAL | 0 refills | Status: AC

## 2022-02-21 ENCOUNTER — Ambulatory Visit: Payer: BC Managed Care – PPO | Admitting: Internal Medicine

## 2022-02-23 ENCOUNTER — Encounter: Payer: Self-pay | Admitting: Internal Medicine

## 2022-03-07 ENCOUNTER — Ambulatory Visit: Payer: BC Managed Care – PPO | Admitting: Internal Medicine

## 2022-03-30 ENCOUNTER — Ambulatory Visit: Payer: BC Managed Care – PPO | Admitting: Neurology

## 2022-04-07 ENCOUNTER — Ambulatory Visit: Payer: BC Managed Care – PPO | Admitting: Internal Medicine

## 2022-04-12 ENCOUNTER — Encounter: Payer: Self-pay | Admitting: Internal Medicine

## 2022-04-13 ENCOUNTER — Ambulatory Visit: Payer: Medicaid Other | Admitting: Internal Medicine

## 2022-04-13 ENCOUNTER — Encounter: Payer: Self-pay | Admitting: Internal Medicine

## 2022-04-13 ENCOUNTER — Other Ambulatory Visit: Payer: Self-pay | Admitting: Neurology

## 2022-04-13 VITALS — BP 136/78 | HR 69 | Temp 98.3°F | Resp 16 | Ht 64.0 in | Wt 185.0 lb

## 2022-04-13 DIAGNOSIS — I10 Essential (primary) hypertension: Secondary | ICD-10-CM

## 2022-04-13 DIAGNOSIS — G4733 Obstructive sleep apnea (adult) (pediatric): Secondary | ICD-10-CM

## 2022-04-13 DIAGNOSIS — D513 Other dietary vitamin B12 deficiency anemia: Secondary | ICD-10-CM | POA: Diagnosis not present

## 2022-04-13 DIAGNOSIS — L0201 Cutaneous abscess of face: Secondary | ICD-10-CM | POA: Insufficient documentation

## 2022-04-13 DIAGNOSIS — L03211 Cellulitis of face: Secondary | ICD-10-CM | POA: Insufficient documentation

## 2022-04-13 DIAGNOSIS — E222 Syndrome of inappropriate secretion of antidiuretic hormone: Secondary | ICD-10-CM | POA: Diagnosis not present

## 2022-04-13 DIAGNOSIS — G4710 Hypersomnia, unspecified: Secondary | ICD-10-CM

## 2022-04-13 LAB — BASIC METABOLIC PANEL
BUN: 13 mg/dL (ref 6–23)
CO2: 28 mEq/L (ref 19–32)
Calcium: 9.2 mg/dL (ref 8.4–10.5)
Chloride: 94 mEq/L — ABNORMAL LOW (ref 96–112)
Creatinine, Ser: 0.52 mg/dL (ref 0.40–1.20)
GFR: 101.88 mL/min (ref >60.00)
Glucose, Bld: 92 mg/dL (ref 70–99)
Potassium: 3.9 mEq/L (ref 3.5–5.1)
Sodium: 130 mEq/L — ABNORMAL LOW (ref 135–145)

## 2022-04-13 LAB — CBC WITH DIFFERENTIAL/PLATELET
Basophils Absolute: 0 10*3/uL (ref 0.0–0.1)
Basophils Relative: 0.6 % (ref 0.0–3.0)
Eosinophils Absolute: 0.1 10*3/uL (ref 0.0–0.7)
Eosinophils Relative: 1.8 % (ref 0.0–5.0)
HCT: 35.3 % — ABNORMAL LOW (ref 36.0–46.0)
Hemoglobin: 11.8 g/dL — ABNORMAL LOW (ref 12.0–15.0)
Lymphocytes Relative: 34 % (ref 12.0–46.0)
Lymphs Abs: 1.4 10*3/uL (ref 0.7–4.0)
MCHC: 33.4 g/dL (ref 30.0–36.0)
MCV: 88.3 fl (ref 78.0–100.0)
Monocytes Absolute: 0.4 10*3/uL (ref 0.1–1.0)
Monocytes Relative: 9.6 % (ref 3.0–12.0)
Neutro Abs: 2.3 10*3/uL (ref 1.4–7.7)
Neutrophils Relative %: 54 % (ref 43.0–77.0)
Platelets: 354 10*3/uL (ref 150.0–400.0)
RBC: 4 Mil/uL (ref 3.87–5.11)
RDW: 13.6 % (ref 11.5–15.5)
WBC: 4.2 10*3/uL (ref 4.0–10.5)

## 2022-04-13 MED ORDER — SULFAMETHOXAZOLE-TRIMETHOPRIM 800-160 MG PO TABS: 1.0000 | ORAL_TABLET | Freq: Two times a day (BID) | ORAL | 0 refills | Status: DC

## 2022-04-13 NOTE — Patient Instructions (Signed)
Incision and Drainage Incision and drainage is a procedure to open and drain a fluid-filled sac. The sac may be filled with pus, mucus, or blood. Sacs that may need to be drained include cysts, skin infections (abscesses), and red lumps called boils that form from a small abscess or after a cyst bursts. You may need this procedure if you have a fluid-filled sac that is large, painful, infected, or not healing well. Tell a health care provider about: Any allergies you have. All medicines you are taking, including vitamins, herbs, eye drops, creams, and over-the-counter medicines. Any problems you or family members have had with anesthesia. Any bleeding problems you have. Any surgeries you have had. Any medical conditions you have. Any medical implants you have in your body. This includes a pacemaker or joint replacement. What are the risks? Your health care provider will talk with you about risks. These may include: Infection. Bleeding. Allergic reactions to medicines. Scarring. The return of the cyst or abscess. Damage to nerves or blood vessels. What happens before the procedure? Medicine Ask your provider about: Changing or stopping your regular medicines. These include any diabetes medicines or blood thinners you take. Taking medicines such as aspirin and ibuprofen. These medicines can thin your blood. Do not take them unless your provider tells you to. Taking over-the-counter medicines, vitamins, herbs, and supplements. You may need to start taking antibiotics before your procedure. Tests You may have tests. These may include: Imaging tests, such as an X-ray or ultrasound. These may be done to see how large or deep the fluid-filled sac is. Blood tests. Electrocardiogram (ECG). This test checks your heart rhythm. Surgery safety Ask your provider: How your surgery site will be marked. What steps will be taken to help prevent infection. These steps may include: Removing hair at  the surgery site. Washing skin with a soap that kills germs. Receiving antibiotics. General instructions Follow instructions from your provider about what you may eat and drink. Do not use any products that contain nicotine or tobacco for at least 4 weeks before the procedure. These products include cigarettes, chewing tobacco, and vaping devices, such as e-cigarettes. If you need help quitting, ask your provider. You may need to get a tetanus shot. If you will be going home right after the procedure, plan to have a responsible adult: Take you home from the hospital or clinic. You will not be allowed to drive. Care for you for the time you are told. What happens during the procedure?  An IV may be inserted into one of your veins. You may be given: A sedative. This helps you relax. Anesthesia. This keeps you from feeling pain. It will numb certain areas of your body. An incision will be made in the top of the fluid-filled sac. Pus, blood, and mucus will be squeezed out. A syringe or tube (drain) may be used to empty more fluid from the sac. The inside of the sac may be washed out (irrigated) with a germ-free solution. Depending on the size of your abscess: The drain may be left in place for a few weeks. The edges of the incision may be stitched open to make a long-term opening for drainage. A gauze packing may be placed inside the incision. A sample of the drainage fluid may be taken to do a culture. This is when the sample is checked for bacteria. The incision may be covered with a bandage (dressing). The procedure may vary among providers and hospitals. What happens after the procedure?  Your blood pressure, heart rate, breathing rate, and blood oxygen level will be monitored until you leave the hospital or clinic. You will be given medicine for pain. You will be watched for any signs of infection. If you were given a sedative during the procedure, it can affect you for several hours.  Do not drive or operate machinery until your provider says that it is safe. This information is not intended to replace advice given to you by your health care provider. Make sure you discuss any questions you have with your health care provider. Document Revised: 09/06/2021 Document Reviewed: 09/06/2021 Elsevier Patient Education  [place].

## 2022-04-13 NOTE — Progress Notes (Unsigned)
Subjective:  Patient ID: Cheryl Taylor, female    DOB: 15-May-1965  Age: 57 y.o. MRN: [id number]  CC: Hypertension   HPI Cheryl Taylor presents for f/up ---  She complains of a 1 week history of red, painful, scabbed area on her chin.  She says that she has been pressing on it and it has been draining pus.  She denies lymphadenopathy, fever, chills, or trouble swallowing.  Outpatient Medications Prior to Visit  Medication Sig Dispense Refill   amLODipine (NORVASC) 10 MG tablet TAKE 1 TABLET BY MOUTH EVERY DAY 90 tablet 0   demeclocycline (DECLOMYCIN) 150 MG tablet TAKE 1 TABLET BY MOUTH TWICE A DAY 180 tablet 0   escitalopram (LEXAPRO) 20 MG tablet TAKE 1 TABLET BY MOUTH EVERY DAY 90 tablet 0   hydrALAZINE (APRESOLINE) 25 MG tablet Take 1 tablet (25 mg total) by mouth 3 (three) times daily. 270 tablet 0   nebivolol (BYSTOLIC) 10 MG tablet TAKE 1 TABLET BY MOUTH EVERY DAY 90 tablet 0   spironolactone (ALDACTONE) 25 MG tablet Take 1 tablet (25 mg total) by mouth daily. 90 tablet 3   traZODone (DESYREL) 50 MG tablet Take 0.5-1 tablets (25-50 mg total) by mouth as needed. 180 tablet 1   valACYclovir (VALTREX) 1000 MG tablet Take 1 tablet by mouth daily 90 tablet 0   torsemide (DEMADEX) 10 MG tablet TAKE 1 TABLET BY MOUTH EVERY DAY 90 tablet 0   Armodafinil 250 MG tablet Take 1 tablet (250 mg total) by mouth daily. 90 tablet 0   No facility-administered medications prior to visit.    ROS Review of Systems  Constitutional: Negative.  Negative for chills, fatigue and fever.  HENT: Negative.    Eyes: Negative.   Respiratory:  Positive for apnea. Negative for cough, shortness of breath and wheezing.   Cardiovascular:  Negative for chest pain, palpitations and leg swelling.  Gastrointestinal:  Negative for abdominal pain, constipation, diarrhea, nausea and vomiting.  Endocrine: Negative.   Genitourinary: Negative.   Musculoskeletal:  Positive for arthralgias.  Skin:  Positive for  color change and wound. Negative for pallor and rash.  Neurological: Negative.   Hematological:  Negative for adenopathy. Does not bruise/bleed easily.  Psychiatric/Behavioral: Negative.      Objective:  BP 136/78 (BP Location: Left Arm, Patient Position: Sitting, Cuff Size: Large)   Pulse 69   Temp 98.3 F (36.8 C) (Oral)   Resp 16   Ht 5\' 4"  (1.626 m)   Wt 185 lb (83.9 kg)   LMP 11/13/2017   SpO2 96%   BMI 31.76 kg/m   BP Readings from Last 3 Encounters:  04/15/22 138/82  04/13/22 136/78  09/21/21 (!) 168/84    Wt Readings from Last 3 Encounters:  04/15/22 185 lb (83.9 kg)  04/13/22 185 lb (83.9 kg)  09/21/21 192 lb (87.1 kg)    Physical Exam Vitals reviewed.  Constitutional:      Appearance: She is not ill-appearing.  HENT:     Nose: Nose normal.     Mouth/Throat:     Mouth: Mucous membranes are moist.  Eyes:     General: No scleral icterus.    Conjunctiva/sclera: Conjunctivae normal.  Cardiovascular:     Rate and Rhythm: Normal rate and regular rhythm.     Heart sounds: No murmur heard. Pulmonary:     Effort: Pulmonary effort is normal.     Breath sounds: No stridor. No wheezing, rhonchi or rales.  Abdominal:  General: Abdomen is flat.     Palpations: There is no mass.     Tenderness: There is no abdominal tenderness. There is no guarding.     Hernia: No hernia is present.  Musculoskeletal:        General: Normal range of motion.     Cervical back: Neck supple. No tenderness.  Lymphadenopathy:     Head:     Right side of head: No submental, submandibular or preauricular adenopathy.     Left side of head: No submental, submandibular or preauricular adenopathy.     Cervical: No cervical adenopathy.     Right cervical: No superficial cervical adenopathy.    Left cervical: No superficial cervical adenopathy.     Upper Body:     Right upper body: No supraclavicular adenopathy.     Left upper body: No supraclavicular adenopathy.  Skin:    Findings:  Erythema present. No rash.  Neurological:     General: No focal deficit present.     Mental Status: She is alert.     Lab Results  Component Value Date   WBC 4.2 04/13/2022   HGB 11.8 (L) 04/13/2022   HCT 35.3 (L) 04/13/2022   PLT 354.0 04/13/2022   GLUCOSE 92 04/13/2022   CHOL 245 (H) 08/04/2021   TRIG 163.0 (H) 08/04/2021   HDL 62.30 08/04/2021   LDLDIRECT 149.0 07/30/2019   LDLCALC 150 (H) 08/04/2021   ALT 19 12/08/2020   AST 17 12/08/2020   NA 130 (L) 04/13/2022   K 3.9 04/13/2022   CL 94 (L) 04/13/2022   CREATININE 0.52 04/13/2022   BUN 13 04/13/2022   CO2 28 04/13/2022   TSH 1.66 08/04/2021   HGBA1C 5.9 12/08/2020    US Renal Artery Stenosis  Result Date: 10/14/2021 CLINICAL DATA:  Hypertension, refractory EXAM: RENAL/URINARY TRACT ULTRASOUND RENAL DUPLEX DOPPLER ULTRASOUND COMPARISON:  None Available. FINDINGS: Right Kidney: Length: 11.4 cm. Increased echogenicity of the renal cortex. No mass or hydronephrosis visualized. Left Kidney: Length: 12.1 cm. Increased echogenicity of the renal cortex. No mass or hydronephrosis visualized. Bladder:  Within normal limits. RENAL DUPLEX ULTRASOUND Right Renal Artery Velocities: Origin:  147 cm/sec Mid:  107 cm/sec Hilum:  1 1 cm/sec Interlobar:  64 cm/sec Arcuate:  39 cm/sec, normal resistive indices ranging 0.72-0.78 Left Renal Artery Velocities: Origin:  104 cm/sec Mid:  103 cm/sec Hilum:  133 cm/sec Interlobar:  57 cm/sec Arcuate:  37 cm/sec, normal resistive indices ranging from 0.69-0.80 Aortic Velocity:  98 cm/sec Right Renal-Aortic Ratios: Origin: 1.5 Mid:  1.1 Hilum: 1.1 Interlobar: 0.7 Arcuate: 0.4 Left Renal-Aortic Ratios: Origin: 1.1 Mid: 1.1 Hilum: 1.4 Interlobar: 0.7 Arcuate: 0.4 IMPRESSION: 1. Normal vascular examination of the bilateral renal arteries. No findings of renal artery stenosis. 2. Bilateral kidneys demonstrate increased cortical echogenicity, nonspecific but suggestive of underlying medical renal disease.  Electronically Signed   By: Albin Felling M.D.   On: 10/14/2021 12:01   After informed verbal consent was obtained. Using Betadine for cleansing and 2% Lidocaine with epinephrine for anesthetic (2 cc's used), with sterile technique a 4 mm punch incision was made . There was a bloody exudate. The cavity was cultured and irrigated with Qtips. No deep tracking or loculations were found. The cavity was packed with iodoform. Hemostasis was obtained by pressure. The specimen is labeled and sent. The procedure was well tolerated without complications.   Assessment & Plan:  Abscess of chin- Will empirically treat for staph awaiting the culture results. -  WOUND CULTURE; Future -     Sulfamethoxazole-Trimethoprim; Take 1 tablet by mouth 2 (two) times daily for 7 days.  Dispense: 14 tablet; Refill: 0  Essential hypertension- Her blood pressure is adequately well-controlled. -     Basic metabolic panel; Future -     Torsemide; Take 1 tablet (10 mg total) by mouth daily.  Dispense: 90 tablet; Refill: 0  Other dietary vitamin B12 deficiency anemia - Will treat the folate defic. -     CBC with Differential/Platelet; Future  SIADH (syndrome of inappropriate ADH production) ([place])- Her sodium level is stable. -     Basic metabolic panel; Future -     Torsemide; Take 1 tablet (10 mg total) by mouth daily.  Dispense: 90 tablet; Refill: 0  Cellulitis of chin -     Sulfamethoxazole-Trimethoprim; Take 1 tablet by mouth 2 (two) times daily for 7 days.  Dispense: 14 tablet; Refill: 0     Follow-up: Return in about 2 days (around 04/15/2022).  [name], MD

## 2022-04-14 ENCOUNTER — Other Ambulatory Visit: Payer: Self-pay | Admitting: Neurology

## 2022-04-14 ENCOUNTER — Other Ambulatory Visit: Payer: Self-pay | Admitting: Internal Medicine

## 2022-04-14 DIAGNOSIS — G4710 Hypersomnia, unspecified: Secondary | ICD-10-CM

## 2022-04-14 DIAGNOSIS — I10 Essential (primary) hypertension: Secondary | ICD-10-CM

## 2022-04-14 DIAGNOSIS — G4733 Obstructive sleep apnea (adult) (pediatric): Secondary | ICD-10-CM

## 2022-04-14 DIAGNOSIS — E222 Syndrome of inappropriate secretion of antidiuretic hormone: Secondary | ICD-10-CM

## 2022-04-14 MED ORDER — TORSEMIDE 10 MG PO TABS: 10.0000 mg | ORAL_TABLET | Freq: Every day | ORAL | 0 refills | Status: DC

## 2022-04-15 ENCOUNTER — Ambulatory Visit: Payer: Medicaid Other | Admitting: Internal Medicine

## 2022-04-15 ENCOUNTER — Encounter: Payer: Self-pay | Admitting: Internal Medicine

## 2022-04-15 VITALS — BP 138/82 | HR 68 | Temp 98.4°F | Ht 64.0 in | Wt 185.0 lb

## 2022-04-15 DIAGNOSIS — L03211 Cellulitis of face: Secondary | ICD-10-CM

## 2022-04-15 DIAGNOSIS — I10 Essential (primary) hypertension: Secondary | ICD-10-CM

## 2022-04-15 DIAGNOSIS — L0201 Cutaneous abscess of face: Secondary | ICD-10-CM

## 2022-04-15 DIAGNOSIS — D539 Nutritional anemia, unspecified: Secondary | ICD-10-CM | POA: Diagnosis not present

## 2022-04-15 DIAGNOSIS — D528 Other folate deficiency anemias: Secondary | ICD-10-CM | POA: Insufficient documentation

## 2022-04-15 DIAGNOSIS — G4710 Hypersomnia, unspecified: Secondary | ICD-10-CM | POA: Diagnosis not present

## 2022-04-15 DIAGNOSIS — G4733 Obstructive sleep apnea (adult) (pediatric): Secondary | ICD-10-CM | POA: Diagnosis not present

## 2022-04-15 LAB — FOLATE: Folate: 4.6 ng/mL — ABNORMAL LOW (ref >5.9)

## 2022-04-15 LAB — IBC + FERRITIN
Ferritin: 36.1 ng/mL (ref 10.0–291.0)
Iron: 199 ug/dL — ABNORMAL HIGH (ref 42–145)
Saturation Ratios: 53 % — ABNORMAL HIGH (ref 20.0–50.0)
TIBC: 375.2 ug/dL (ref 250.0–450.0)
Transferrin: 268 mg/dL (ref 212.0–360.0)

## 2022-04-15 LAB — VITAMIN B12: Vitamin B-12: 260 pg/mL (ref 211–911)

## 2022-04-15 MED ORDER — ARMODAFINIL 250 MG PO TABS: 250.0000 mg | ORAL_TABLET | Freq: Every day | ORAL | 0 refills | Status: DC

## 2022-04-15 MED ORDER — FOLIC ACID 1 MG PO TABS: 1.0000 mg | ORAL_TABLET | Freq: Every day | ORAL | 1 refills | Status: DC

## 2022-04-15 MED ORDER — AMOXICILLIN-POT CLAVULANATE 875-125 MG PO TABS: 1.0000 | ORAL_TABLET | Freq: Two times a day (BID) | ORAL | 0 refills | Status: AC

## 2022-04-15 NOTE — Progress Notes (Unsigned)
Subjective:  Patient ID: Cheryl Taylor, female    DOB: [birth year]  Age: 57 y.o. MRN: [id number]  CC: Anemia and Hypertension   HPI Cheryl Taylor presents for f/up ---  She returns for a recheck s/p I and D. The area continues to drain and feel red and swollen.  Outpatient Medications Prior to Visit  Medication Sig Dispense Refill   amLODipine (NORVASC) 10 MG tablet TAKE 1 TABLET BY MOUTH EVERY DAY 90 tablet 0   demeclocycline (DECLOMYCIN) 150 MG tablet TAKE 1 TABLET BY MOUTH TWICE A DAY 180 tablet 0   escitalopram (LEXAPRO) 20 MG tablet TAKE 1 TABLET BY MOUTH EVERY DAY 90 tablet 0   hydrALAZINE (APRESOLINE) 25 MG tablet Take 1 tablet (25 mg total) by mouth 3 (three) times daily. 270 tablet 0   nebivolol (BYSTOLIC) 10 MG tablet TAKE 1 TABLET BY MOUTH EVERY DAY 90 tablet 0   spironolactone (ALDACTONE) 25 MG tablet Take 1 tablet (25 mg total) by mouth daily. 90 tablet 3   torsemide (DEMADEX) 10 MG tablet Take 1 tablet (10 mg total) by mouth daily. 90 tablet 0   traZODone (DESYREL) 50 MG tablet Take 0.5-1 tablets (25-50 mg total) by mouth as needed. 180 tablet 1   valACYclovir (VALTREX) 1000 MG tablet Take 1 tablet by mouth daily 90 tablet 0   sulfamethoxazole-trimethoprim (BACTRIM DS) 800-160 MG tablet Take 1 tablet by mouth 2 (two) times daily for 7 days. 14 tablet 0   Armodafinil 250 MG tablet Take 1 tablet (250 mg total) by mouth daily. 90 tablet 0   No facility-administered medications prior to visit.    ROS Review of Systems  Constitutional: Negative.  Negative for chills, fatigue and fever.  HENT: Negative.    Eyes: Negative.   Respiratory:  Positive for apnea. Negative for cough, chest tightness, shortness of breath and wheezing.   Cardiovascular:  Negative for chest pain, palpitations and leg swelling.  Gastrointestinal:  Negative for abdominal pain, constipation, diarrhea, nausea and vomiting.  Endocrine: Negative.   Genitourinary: Negative.   Musculoskeletal:  Negative.  Negative for back pain and myalgias.  Skin:  Positive for color change and wound. Negative for rash.  Allergic/Immunologic: Negative.   Neurological: Negative.  Negative for dizziness and weakness.  Hematological:  Negative for adenopathy. Does not bruise/bleed easily.  Psychiatric/Behavioral: Negative.      Objective:  BP 138/82 (BP Location: Right Arm, Patient Position: Sitting, Cuff Size: Large)   Pulse 68   Temp 98.4 F (36.9 C) (Oral)   Ht 5\' 4"  (1.626 m)   Wt 185 lb (83.9 kg)   LMP 11/13/2017   SpO2 96%   BMI 31.76 kg/m   BP Readings from Last 3 Encounters:  04/15/22 138/82  04/13/22 136/78  09/21/21 (!) 168/84    Wt Readings from Last 3 Encounters:  04/15/22 185 lb (83.9 kg)  04/13/22 185 lb (83.9 kg)  09/21/21 192 lb (87.1 kg)    Physical Exam Vitals reviewed.  Eyes:     General: No scleral icterus.    Conjunctiva/sclera: Conjunctivae normal.  Cardiovascular:     Rate and Rhythm: Normal rate and regular rhythm.     Heart sounds: No murmur heard. Pulmonary:     Effort: Pulmonary effort is normal.     Breath sounds: No stridor. No wheezing, rhonchi or rales.  Abdominal:     General: Abdomen is flat.     Palpations: There is no mass.     Tenderness: There  is no abdominal tenderness. There is no guarding.     Hernia: No hernia is present.  Musculoskeletal:     Cervical back: Neck supple.  Lymphadenopathy:     Head:     Right side of head: No submental or submandibular adenopathy.     Left side of head: No submental or submandibular adenopathy.     Cervical: No cervical adenopathy.     Right cervical: No superficial cervical adenopathy.    Left cervical: No superficial cervical adenopathy.  Skin:    General: Skin is warm and dry.     Findings: Erythema present. No rash.  Neurological:     General: No focal deficit present.     Mental Status: She is alert.     Lab Results  Component Value Date   WBC 4.2 04/13/2022   HGB 11.8 (L)  04/13/2022   HCT 35.3 (L) 04/13/2022   PLT 354.0 04/13/2022   GLUCOSE 92 04/13/2022   CHOL 245 (H) 08/04/2021   TRIG 163.0 (H) 08/04/2021   HDL 62.30 08/04/2021   LDLDIRECT 149.0 07/30/2019   LDLCALC 150 (H) 08/04/2021   ALT 19 12/08/2020   AST 17 12/08/2020   NA 130 (L) 04/13/2022   K 3.9 04/13/2022   CL 94 (L) 04/13/2022   CREATININE 0.52 04/13/2022   BUN 13 04/13/2022   CO2 28 04/13/2022   TSH 1.66 08/04/2021   HGBA1C 5.9 12/08/2020    US Renal Artery Stenosis  Result Date: 10/14/2021 CLINICAL DATA:  Hypertension, refractory EXAM: RENAL/URINARY TRACT ULTRASOUND RENAL DUPLEX DOPPLER ULTRASOUND COMPARISON:  None Available. FINDINGS: Right Kidney: Length: 11.4 cm. Increased echogenicity of the renal cortex. No mass or hydronephrosis visualized. Left Kidney: Length: 12.1 cm. Increased echogenicity of the renal cortex. No mass or hydronephrosis visualized. Bladder:  Within normal limits. RENAL DUPLEX ULTRASOUND Right Renal Artery Velocities: Origin:  147 cm/sec Mid:  107 cm/sec Hilum:  1 1 cm/sec Interlobar:  64 cm/sec Arcuate:  39 cm/sec, normal resistive indices ranging 0.72-0.78 Left Renal Artery Velocities: Origin:  104 cm/sec Mid:  103 cm/sec Hilum:  133 cm/sec Interlobar:  57 cm/sec Arcuate:  37 cm/sec, normal resistive indices ranging from 0.69-0.80 Aortic Velocity:  98 cm/sec Right Renal-Aortic Ratios: Origin: 1.5 Mid:  1.1 Hilum: 1.1 Interlobar: 0.7 Arcuate: 0.4 Left Renal-Aortic Ratios: Origin: 1.1 Mid: 1.1 Hilum: 1.4 Interlobar: 0.7 Arcuate: 0.4 IMPRESSION: 1. Normal vascular examination of the bilateral renal arteries. No findings of renal artery stenosis. 2. Bilateral kidneys demonstrate increased cortical echogenicity, nonspecific but suggestive of underlying medical renal disease. Electronically Signed   By: Albin Felling M.D.   On: 10/14/2021 12:01    Assessment & Plan:   Cellulitis of chin- The culture is negative. Will treat for strep and other bacteria. -      Amoxicillin-Pot Clavulanate; Take 1 tablet by mouth 2 (two) times daily for 10 days.  Dispense: 20 tablet; Refill: 0  Essential hypertension- Her BP is adequately well controlled.  Deficiency anemia- Will treat the folate deficiency. -     IBC + Ferritin; Future -     Hemoglobinopathy Evaluation; Future -     Reticulocytes; Future -     Vitamin B1; Future -     Zinc; Future -     Folate; Future -     Vitamin B12; Future  OSA (obstructive sleep apnea) -     Armodafinil; Take 1 tablet (250 mg total) by mouth daily.  Dispense: 90 tablet; Refill: 0  Excessive somnolence disorder -  Armodafinil; Take 1 tablet (250 mg total) by mouth daily.  Dispense: 90 tablet; Refill: 0  Other folate deficiency anemias -     Folic Acid; Take 1 tablet (1 mg total) by mouth daily.  Dispense: 90 tablet; Refill: 1     Follow-up: No follow-ups on file.  [name], MD

## 2022-04-16 LAB — WOUND CULTURE

## 2022-04-19 ENCOUNTER — Other Ambulatory Visit: Payer: Self-pay | Admitting: Internal Medicine

## 2022-04-19 ENCOUNTER — Encounter: Payer: Self-pay | Admitting: Internal Medicine

## 2022-04-19 DIAGNOSIS — F331 Major depressive disorder, recurrent, moderate: Secondary | ICD-10-CM

## 2022-04-19 NOTE — Telephone Encounter (Signed)
Med is not on med list. Pls advise.Marland KitchenJohny Chess

## 2022-04-20 ENCOUNTER — Other Ambulatory Visit: Payer: Self-pay | Admitting: Internal Medicine

## 2022-04-20 DIAGNOSIS — K219 Gastro-esophageal reflux disease without esophagitis: Secondary | ICD-10-CM

## 2022-04-20 MED ORDER — OMEPRAZOLE 20 MG PO CPDR: 20.0000 mg | DELAYED_RELEASE_CAPSULE | Freq: Every day | ORAL | 1 refills | Status: DC

## 2022-04-24 ENCOUNTER — Other Ambulatory Visit: Payer: Self-pay | Admitting: Internal Medicine

## 2022-04-24 DIAGNOSIS — I10 Essential (primary) hypertension: Secondary | ICD-10-CM

## 2022-04-25 MED ORDER — NEBIVOLOL HCL 10 MG PO TABS: 10.0000 mg | ORAL_TABLET | Freq: Every day | ORAL | 0 refills | Status: DC

## 2022-04-27 LAB — HEMOGLOBINOPATHY EVALUATION
Fetal Hemoglobin Testing: <1 % (ref 0.0–1.9)
HCT: 34.9 % — ABNORMAL LOW (ref 35.0–45.0)
Hemoglobin A2 - HGBRFX: 2.6 % (ref 2.2–3.2)
Hemoglobin: 11.4 g/dL — ABNORMAL LOW (ref 11.7–15.5)
Hgb A: 97.4 % (ref >96.0)
MCH: 30.4 pg (ref 27.0–33.0)
MCV: 93.1 fL (ref 80.0–100.0)
RBC: 3.75 10*6/uL — ABNORMAL LOW (ref 3.80–5.10)
RDW: 12.6 % (ref 11.0–15.0)

## 2022-04-27 LAB — VITAMIN B1: Vitamin B1 (Thiamine): 11 nmol/L (ref 8–30)

## 2022-04-27 LAB — RETICULOCYTES
ABS Retic: 48620 cells/uL (ref 20000–80000)
Retic Ct Pct: 1.3 %

## 2022-04-27 LAB — ZINC: Zinc: 81 ug/dL (ref 60–130)

## 2022-05-03 ENCOUNTER — Ambulatory Visit: Payer: Medicaid Other | Admitting: Internal Medicine

## 2022-05-03 NOTE — Telephone Encounter (Signed)
Requested Prescriptions   Pending Prescriptions Disp Refills   Armodafinil 250 MG tablet [Pharmacy Med Name: ARMODAFINIL 250 MG TABLET] 90 tablet 0    Sig: TAKE 1 TABLET BY MOUTH EVERY DAY   PT LAST SEEN BY Larey Seat, MD ON2/22/23 (OVERDUE). NO UPCOMING APPT. REFILL WILL BE DENIED AS THIS PT IS IN NEED OF AN APPOINTMENT.  Dispenses   Dispensed Days Supply Quantity Provider Pharmacy  ARMODAFINIL 250 MG TABLET 01/03/2022 90 90 each Star Age, MD [id number]  ARMODAFINIL 250 MG TABLET 09/30/2021 90 90 each Dohmeier, Asencion Partridge, MD [id number]  ARMODAFINIL 250 MG TABLET 06/24/2021 90 90 each Marcial Pacas, MD [id number]Marland KitchenMarland Kitchen

## 2022-05-13 ENCOUNTER — Encounter: Payer: Self-pay | Admitting: Internal Medicine

## 2022-05-16 ENCOUNTER — Other Ambulatory Visit: Payer: Self-pay | Admitting: Internal Medicine

## 2022-05-16 DIAGNOSIS — F5104 Psychophysiologic insomnia: Secondary | ICD-10-CM

## 2022-05-16 MED ORDER — TRAZODONE HCL 50 MG PO TABS: 100.0000 mg | ORAL_TABLET | ORAL | 1 refills | Status: DC | PRN

## 2022-05-26 ENCOUNTER — Ambulatory Visit (INDEPENDENT_AMBULATORY_CARE_PROVIDER_SITE_OTHER): Payer: Medicaid Other

## 2022-05-26 ENCOUNTER — Encounter: Payer: Self-pay | Admitting: Internal Medicine

## 2022-05-26 ENCOUNTER — Ambulatory Visit: Payer: Medicaid Other | Admitting: Internal Medicine

## 2022-05-26 VITALS — BP 144/88 | HR 74 | Temp 98.0°F | Ht 64.0 in | Wt 176.0 lb

## 2022-05-26 DIAGNOSIS — R052 Subacute cough: Secondary | ICD-10-CM

## 2022-05-26 DIAGNOSIS — E222 Syndrome of inappropriate secretion of antidiuretic hormone: Secondary | ICD-10-CM | POA: Diagnosis not present

## 2022-05-26 DIAGNOSIS — D528 Other folate deficiency anemias: Secondary | ICD-10-CM | POA: Diagnosis not present

## 2022-05-26 DIAGNOSIS — I10 Essential (primary) hypertension: Secondary | ICD-10-CM

## 2022-05-26 DIAGNOSIS — D513 Other dietary vitamin B12 deficiency anemia: Secondary | ICD-10-CM | POA: Diagnosis not present

## 2022-05-26 LAB — CBC WITH DIFFERENTIAL/PLATELET
Basophils Absolute: 0.1 10*3/uL (ref 0.0–0.1)
Basophils Relative: 0.7 % (ref 0.0–3.0)
Eosinophils Absolute: 0.1 10*3/uL (ref 0.0–0.7)
Eosinophils Relative: 1.2 % (ref 0.0–5.0)
HCT: 37.4 % (ref 36.0–46.0)
Hemoglobin: 12.4 g/dL (ref 12.0–15.0)
Lymphocytes Relative: 26.7 % (ref 12.0–46.0)
Lymphs Abs: 2.5 10*3/uL (ref 0.7–4.0)
MCHC: 33.2 g/dL (ref 30.0–36.0)
MCV: 86.9 fl (ref 78.0–100.0)
Monocytes Absolute: 1 10*3/uL (ref 0.1–1.0)
Monocytes Relative: 11.1 % (ref 3.0–12.0)
Neutro Abs: 5.7 10*3/uL (ref 1.4–7.7)
Neutrophils Relative %: 60.3 % (ref 43.0–77.0)
Platelets: 383 10*3/uL (ref 150.0–400.0)
RBC: 4.31 Mil/uL (ref 3.87–5.11)
RDW: 14.2 % (ref 11.5–15.5)
WBC: 9.4 10*3/uL (ref 4.0–10.5)

## 2022-05-26 LAB — BASIC METABOLIC PANEL
BUN: 12 mg/dL (ref 6–23)
CO2: 29 mEq/L (ref 19–32)
Calcium: 9.7 mg/dL (ref 8.4–10.5)
Chloride: 98 mEq/L (ref 96–112)
Creatinine, Ser: 0.63 mg/dL (ref 0.40–1.20)
GFR: 97.19 mL/min (ref >60.00)
Glucose, Bld: 90 mg/dL (ref 70–99)
Potassium: 3.9 mEq/L (ref 3.5–5.1)
Sodium: 135 mEq/L (ref 135–145)

## 2022-05-26 LAB — TSH: TSH: 0.91 u[IU]/mL (ref 0.35–5.50)

## 2022-05-26 NOTE — Progress Notes (Signed)
Subjective:  Patient ID: Cheryl Taylor, female    DOB: 07/26/1965  Age: 57 y.o. MRN: [id number]  CC: Hypertension and Cough   HPI Cheryl Taylor presents for f/up ---  3 weeks ago she developed nonproductive cough, chills, runny nose, and myalgias.  Her only lingering symptoms are runny nose and fatigue.  She tested negative for COVID.  Outpatient Medications Prior to Visit  Medication Sig Dispense Refill   amLODipine (NORVASC) 10 MG tablet TAKE 1 TABLET BY MOUTH EVERY DAY 90 tablet 0   Armodafinil 250 MG tablet Take 1 tablet (250 mg total) by mouth daily. 90 tablet 0   demeclocycline (DECLOMYCIN) 150 MG tablet TAKE 1 TABLET BY MOUTH TWICE A DAY 180 tablet 0   escitalopram (LEXAPRO) 20 MG tablet TAKE 1 TABLET BY MOUTH EVERY DAY 90 tablet 0   folic acid (FOLVITE) 1 MG tablet Take 1 tablet (1 mg total) by mouth daily. 90 tablet 1   hydrALAZINE (APRESOLINE) 25 MG tablet Take 1 tablet (25 mg total) by mouth 3 (three) times daily. 270 tablet 0   nebivolol (BYSTOLIC) 10 MG tablet Take 1 tablet (10 mg total) by mouth daily. 90 tablet 0   omeprazole (PRILOSEC) 20 MG capsule Take 1 capsule (20 mg total) by mouth daily. 90 capsule 1   spironolactone (ALDACTONE) 25 MG tablet Take 1 tablet (25 mg total) by mouth daily. 90 tablet 3   traZODone (DESYREL) 50 MG tablet Take 2 tablets (100 mg total) by mouth as needed. 180 tablet 1   valACYclovir (VALTREX) 1000 MG tablet Take 1 tablet by mouth daily 90 tablet 0   torsemide (DEMADEX) 10 MG tablet Take 1 tablet (10 mg total) by mouth daily. 90 tablet 0   No facility-administered medications prior to visit.    ROS Review of Systems  Constitutional:  Positive for fatigue and unexpected weight change (wt loss). Negative for activity change, appetite change, chills and fever.  HENT:  Positive for postnasal drip and rhinorrhea. Negative for facial swelling, nosebleeds, sinus pressure, sore throat, trouble swallowing and voice change.   Eyes:  Negative.   Respiratory: Negative.  Negative for cough, chest tightness, shortness of breath and wheezing.   Cardiovascular:  Negative for chest pain, palpitations and leg swelling.  Gastrointestinal:  Negative for abdominal pain, constipation, diarrhea, nausea and vomiting.  Endocrine: Negative.   Genitourinary: Negative.  Negative for difficulty urinating.  Musculoskeletal: Negative.   Skin: Negative.   Neurological:  Negative for dizziness, weakness, light-headedness and headaches.  Hematological:  Negative for adenopathy. Does not bruise/bleed easily.  Psychiatric/Behavioral: Negative.      Objective:  BP (!) 144/88 (BP Location: Left Arm, Patient Position: Sitting, Cuff Size: Normal)   Pulse 74   Temp 98 F (36.7 C) (Oral)   Ht 5\' 4"  (1.626 m)   Wt 176 lb (79.8 kg)   LMP 11/13/2017   SpO2 95%   BMI 30.21 kg/m   BP Readings from Last 3 Encounters:  05/26/22 (!) 144/88  04/15/22 138/82  04/13/22 136/78    Wt Readings from Last 3 Encounters:  05/26/22 176 lb (79.8 kg)  04/15/22 185 lb (83.9 kg)  04/13/22 185 lb (83.9 kg)    Physical Exam Vitals reviewed.  Constitutional:      General: She is not in acute distress.    Appearance: She is not ill-appearing, toxic-appearing or diaphoretic.  HENT:     Nose: Nose normal.     Mouth/Throat:     Mouth:  Mucous membranes are moist.  Eyes:     General: No scleral icterus.    Conjunctiva/sclera: Conjunctivae normal.  Cardiovascular:     Rate and Rhythm: Normal rate and regular rhythm.     Heart sounds: No murmur heard.    No gallop.  Pulmonary:     Effort: Pulmonary effort is normal.     Breath sounds: Examination of the right-lower field reveals rhonchi. Examination of the left-lower field reveals rhonchi. Rhonchi present. No decreased breath sounds, wheezing or rales.  Abdominal:     General: Abdomen is flat.     Palpations: There is no mass.     Tenderness: There is no abdominal tenderness. There is no guarding.      Hernia: No hernia is present.  Musculoskeletal:     Cervical back: Neck supple.  Lymphadenopathy:     Cervical: No cervical adenopathy.  Skin:    General: Skin is warm.     Findings: No rash.  Neurological:     General: No focal deficit present.     Mental Status: She is alert. Mental status is at baseline.  Psychiatric:        Mood and Affect: Mood normal.        Behavior: Behavior normal.     Lab Results  Component Value Date   WBC 9.4 05/26/2022   HGB 12.4 05/26/2022   HCT 37.4 05/26/2022   PLT 383.0 05/26/2022   GLUCOSE 90 05/26/2022   CHOL 245 (H) 08/04/2021   TRIG 163.0 (H) 08/04/2021   HDL 62.30 08/04/2021   LDLDIRECT 149.0 07/30/2019   LDLCALC 150 (H) 08/04/2021   ALT 19 12/08/2020   AST 17 12/08/2020   NA 135 05/26/2022   K 3.9 05/26/2022   CL 98 05/26/2022   CREATININE 0.63 05/26/2022   BUN 12 05/26/2022   CO2 29 05/26/2022   TSH 0.91 05/26/2022   HGBA1C 5.9 12/08/2020    US Renal Artery Stenosis  Result Date: 10/14/2021 CLINICAL DATA:  Hypertension, refractory EXAM: RENAL/URINARY TRACT ULTRASOUND RENAL DUPLEX DOPPLER ULTRASOUND COMPARISON:  None Available. FINDINGS: Right Kidney: Length: 11.4 cm. Increased echogenicity of the renal cortex. No mass or hydronephrosis visualized. Left Kidney: Length: 12.1 cm. Increased echogenicity of the renal cortex. No mass or hydronephrosis visualized. Bladder:  Within normal limits. RENAL DUPLEX ULTRASOUND Right Renal Artery Velocities: Origin:  147 cm/sec Mid:  107 cm/sec Hilum:  1 1 cm/sec Interlobar:  64 cm/sec Arcuate:  39 cm/sec, normal resistive indices ranging 0.72-0.78 Left Renal Artery Velocities: Origin:  104 cm/sec Mid:  103 cm/sec Hilum:  133 cm/sec Interlobar:  57 cm/sec Arcuate:  37 cm/sec, normal resistive indices ranging from 0.69-0.80 Aortic Velocity:  98 cm/sec Right Renal-Aortic Ratios: Origin: 1.5 Mid:  1.1 Hilum: 1.1 Interlobar: 0.7 Arcuate: 0.4 Left Renal-Aortic Ratios: Origin: 1.1 Mid: 1.1 Hilum: 1.4  Interlobar: 0.7 Arcuate: 0.4 IMPRESSION: 1. Normal vascular examination of the bilateral renal arteries. No findings of renal artery stenosis. 2. Bilateral kidneys demonstrate increased cortical echogenicity, nonspecific but suggestive of underlying medical renal disease. Electronically Signed   By: Olive Bass M.D.   On: 10/14/2021 12:01   DG Chest 2 View  Result Date: 05/26/2022 CLINICAL DATA:  Cough for 3 weeks EXAM: CHEST - 2 VIEW COMPARISON:  03/16/2018 FINDINGS: The heart size and mediastinal contours are within normal limits. Both lungs are clear. The visualized skeletal structures are unremarkable. IMPRESSION: No active cardiopulmonary disease. Electronically Signed   By: Sharlet Salina M.D.   On: 05/26/2022  15:55     Assessment & Plan:   Essential hypertension- Her blood pressure is adequately well-controlled. -     CBC with Differential/Platelet; Future -     TSH; Future  Other dietary vitamin B12 deficiency anemia -     CBC with Differential/Platelet; Future  SIADH (syndrome of inappropriate ADH production) (HCC)- Her sodium is normal now. -     Basic metabolic panel; Future  Subacute cough- Chest x-ray is normal. -     DG Chest 2 View; Future  Other folate deficiency anemias- Her H&H are normal now. -     CBC with Differential/Platelet; Future     Follow-up: Return in about 3 months (around 08/25/2022).  Sanda Linger, MD

## 2022-05-26 NOTE — Patient Instructions (Signed)
Fatigue If you have fatigue, you feel tired all the time and have a lack of energy or a lack of motivation. Fatigue may make it difficult to start or complete tasks because of exhaustion. Occasional or mild fatigue is often a normal response to activity or life. However, long-term (chronic) or extreme fatigue may be a symptom of a medical condition such as: Depression. Not having enough red blood cells or hemoglobin in the blood (anemia). A problem with a small gland located in the lower front part of the neck (thyroid disorder). Rheumatologic conditions. These are problems related to the body's defense system (immune system). Infections, especially certain viral infections. Fatigue can also lead to negative health outcomes over time. Follow these instructions at home: Medicines Take over-the-counter and prescription medicines only as told by your health care provider. Take a multivitamin if told by your health care provider. Do not use herbal or dietary supplements unless they are approved by your health care provider. Eating and drinking  Avoid heavy meals in the evening. Eat a well-balanced diet, which includes lean proteins, whole grains, plenty of fruits and vegetables, and low-fat dairy products. Avoid eating or drinking too many products with caffeine in them. Avoid alcohol. Drink enough fluid to keep your urine pale yellow. Activity  Exercise regularly, as told by your health care provider. Use or practice techniques to help you relax, such as yoga, tai chi, meditation, or massage therapy. Lifestyle Change situations that cause you stress. Try to keep your work and personal schedules in balance. Do not use recreational or illegal drugs. General instructions Monitor your fatigue for any changes. Go to bed and get up at the same time every day. Avoid fatigue by pacing yourself during the day and getting enough sleep at night. Maintain a healthy weight. Contact a health care  provider if: Your fatigue does not get better. You have a fever. You suddenly lose or gain weight. You have headaches. You have trouble falling asleep or sleeping through the night. You feel angry, guilty, anxious, or sad. You have swelling in your legs or another part of your body. Get help right away if: You feel confused, feel like you might faint, or faint. Your vision is blurry or you have a severe headache. You have severe pain in your abdomen, your back, or the area between your waist and hips (pelvis). You have chest pain, shortness of breath, or an irregular or fast heartbeat. You are unable to urinate, or you urinate less than normal. You have abnormal bleeding from the rectum, nose, lungs, nipples, or, if you are female, the vagina. You vomit blood. You have thoughts about hurting yourself or others. These symptoms may be an emergency. Get help right away. Call 911. Do not wait to see if the symptoms will go away. Do not drive yourself to the hospital. Get help right away if you feel like you may hurt yourself or others, or have thoughts about taking your own life. Go to your nearest emergency room or: Call 911. Call the National Suicide Prevention Lifeline at 1-800-273-8255 or 988. This is open 24 hours a day. Text the Crisis Text Line at 741741. Summary If you have fatigue, you feel tired all the time and have a lack of energy or a lack of motivation. Fatigue may make it difficult to start or complete tasks because of exhaustion. Long-term (chronic) or extreme fatigue may be a symptom of a medical condition. Exercise regularly, as told by your health care provider.   Change situations that cause you stress. Try to keep your work and personal schedules in balance. This information is not intended to replace advice given to you by your health care provider. Make sure you discuss any questions you have with your health care provider. Document Revised: 11/09/2020 Document  Reviewed: 11/09/2020 Elsevier Patient Education  2023 [employer] Inc.  

## 2022-05-27 ENCOUNTER — Other Ambulatory Visit: Payer: Self-pay | Admitting: Internal Medicine

## 2022-05-27 DIAGNOSIS — E222 Syndrome of inappropriate secretion of antidiuretic hormone: Secondary | ICD-10-CM

## 2022-05-27 DIAGNOSIS — I10 Essential (primary) hypertension: Secondary | ICD-10-CM

## 2022-06-02 ENCOUNTER — Other Ambulatory Visit (HOSPITAL_COMMUNITY): Payer: Self-pay

## 2022-06-14 ENCOUNTER — Telehealth: Payer: Self-pay | Admitting: *Deleted

## 2022-06-14 NOTE — Telephone Encounter (Signed)
Prior auth started via cover my meds.  Awaiting determination.  Key: [id number]

## 2022-06-23 ENCOUNTER — Telehealth: Payer: Self-pay

## 2022-06-23 NOTE — Telephone Encounter (Signed)
PA initiated via Covermymeds; KEY [id number]. Awaiting determination.

## 2022-06-25 ENCOUNTER — Telehealth: Payer: Self-pay

## 2022-06-25 NOTE — Telephone Encounter (Signed)
Pharmacy Patient Advocate Encounter  Received notification from [employer] [facility] that the request for prior authorization for Nebivolol has been denied due to .    Please be advised we currently do not have a Pharmacist to review denials, therefore you will need to process appeals accordingly as needed. Thanks for your support at this time.   You may call [phone] or fax [phone], to appeal, appeal form attached to indexed denial letter

## 2022-06-28 ENCOUNTER — Other Ambulatory Visit (HOSPITAL_COMMUNITY): Payer: Self-pay

## 2022-06-28 ENCOUNTER — Other Ambulatory Visit: Payer: Self-pay | Admitting: Internal Medicine

## 2022-06-28 DIAGNOSIS — I10 Essential (primary) hypertension: Secondary | ICD-10-CM

## 2022-06-28 MED ORDER — CARVEDILOL PHOSPHATE ER 10 MG PO CP24
10.0000 mg | ORAL_CAPSULE | Freq: Every day | ORAL | 0 refills | Status: DC
Start: 2022-06-28 — End: 2022-11-16

## 2022-06-29 NOTE — Telephone Encounter (Signed)
Pharmacy Patient Advocate Encounter  Received notification from [employer] [facility] that the request for prior authorization for Nebivolol has been denied due to .    Please be advised we currently do not have a Pharmacist to review denials, therefore you will need to process appeals accordingly as needed. Thanks for your support at this time.   You may call [phone] or fax [phone], to appeal, appeal form attached to indexed denial letter  

## 2022-06-30 ENCOUNTER — Telehealth: Payer: Self-pay

## 2022-06-30 NOTE — Telephone Encounter (Signed)
PA initiated via Covermymeds; KEY: [id number]. Awaiting determination.

## 2022-07-01 NOTE — Telephone Encounter (Signed)
PA has been approved, documented in separate encounter.

## 2022-07-08 ENCOUNTER — Telehealth: Payer: Self-pay

## 2022-07-08 ENCOUNTER — Other Ambulatory Visit (HOSPITAL_COMMUNITY): Payer: Self-pay

## 2022-07-08 NOTE — Telephone Encounter (Signed)
Patient Advocate Encounter   Received notification from [facility] that prior authorization is required for Carvedilol Phosphate ER 10MG  er capsules   Submitted: 07-08-2022 Key [id number]  Status is pending

## 2022-07-08 NOTE — Telephone Encounter (Signed)
Spoke with Revonda Standard at [facility]. She states the PA was archived due to missing information, the provider address and phone number were not provided in the initial PA form.   She has created a new PA, response time 24-48 hours.   PA# [id number]

## 2022-07-12 ENCOUNTER — Telehealth: Payer: Self-pay

## 2022-07-12 NOTE — Telephone Encounter (Signed)
Pharmacy Patient Advocate Encounter  Received notification from [employer] that the request for prior authorization for Demeclocycline has been denied due to criteria not being met - must try and fail 2 preferred drugs (doxycycline monohydrate, doxycycline hyclate, minocycline).     Please be advised we currently do not have a Pharmacist to review denials, therefore you will need to process appeals accordingly as needed. Thanks for your support at this time.   You may call [phone] or fax [phone], to appeal. Denial letter in media

## 2022-07-12 NOTE — Telephone Encounter (Signed)
Pharmacy Patient Advocate Encounter  Received notification from [employer] that the request for prior authorization for Carvedilol Phosphate ER 10MG  er capsules  has been denied due to no trial and failure of 1 preferred drug (carvedilol immediate release, atenolol, labetalol, metoprolol, propranolol IR, propranolol ER).    Please be advised we currently do not have a Pharmacist to review denials, therefore you will need to process appeals accordingly as needed. Thanks for your support at this time.   You may call [phone] or fax [phone], to appeal. Denial letter in media

## 2022-08-22 ENCOUNTER — Other Ambulatory Visit: Payer: Self-pay | Admitting: Internal Medicine

## 2022-08-22 DIAGNOSIS — I10 Essential (primary) hypertension: Secondary | ICD-10-CM

## 2022-08-22 DIAGNOSIS — F331 Major depressive disorder, recurrent, moderate: Secondary | ICD-10-CM

## 2022-08-22 DIAGNOSIS — E222 Syndrome of inappropriate secretion of antidiuretic hormone: Secondary | ICD-10-CM

## 2022-08-22 MED ORDER — AMLODIPINE BESYLATE 10 MG PO TABS: 10.0000 mg | ORAL_TABLET | Freq: Every day | ORAL | 0 refills | Status: DC

## 2022-08-22 MED ORDER — ESCITALOPRAM OXALATE 20 MG PO TABS
20.0000 mg | ORAL_TABLET | Freq: Every day | ORAL | 0 refills | Status: DC
Start: 2022-08-22 — End: 2022-10-05

## 2022-08-22 MED ORDER — SPIRONOLACTONE 25 MG PO TABS
25.0000 mg | ORAL_TABLET | Freq: Every day | ORAL | 0 refills | Status: DC
Start: 2022-08-22 — End: 2022-10-05

## 2022-09-03 ENCOUNTER — Other Ambulatory Visit: Payer: Self-pay | Admitting: Internal Medicine

## 2022-09-03 DIAGNOSIS — I10 Essential (primary) hypertension: Secondary | ICD-10-CM

## 2022-09-05 ENCOUNTER — Ambulatory Visit: Payer: Medicaid Other | Admitting: Internal Medicine

## 2022-09-21 ENCOUNTER — Encounter: Payer: Self-pay | Admitting: Internal Medicine

## 2022-09-21 ENCOUNTER — Ambulatory Visit: Payer: Medicaid Other | Admitting: Internal Medicine

## 2022-09-21 VITALS — BP 152/78 | HR 60 | Temp 98.1°F | Resp 16 | Ht 64.0 in | Wt 171.0 lb

## 2022-09-21 DIAGNOSIS — R001 Bradycardia, unspecified: Secondary | ICD-10-CM | POA: Diagnosis not present

## 2022-09-21 DIAGNOSIS — H538 Other visual disturbances: Secondary | ICD-10-CM | POA: Diagnosis not present

## 2022-09-21 DIAGNOSIS — G252 Other specified forms of tremor: Secondary | ICD-10-CM | POA: Diagnosis not present

## 2022-09-21 DIAGNOSIS — Z124 Encounter for screening for malignant neoplasm of cervix: Secondary | ICD-10-CM

## 2022-09-21 DIAGNOSIS — E785 Hyperlipidemia, unspecified: Secondary | ICD-10-CM

## 2022-09-21 DIAGNOSIS — E538 Deficiency of other specified B group vitamins: Secondary | ICD-10-CM

## 2022-09-21 DIAGNOSIS — R200 Anesthesia of skin: Secondary | ICD-10-CM | POA: Diagnosis not present

## 2022-09-21 DIAGNOSIS — I10 Essential (primary) hypertension: Secondary | ICD-10-CM

## 2022-09-21 DIAGNOSIS — R202 Paresthesia of skin: Secondary | ICD-10-CM

## 2022-09-21 DIAGNOSIS — I1A0 Resistant hypertension: Secondary | ICD-10-CM

## 2022-09-21 DIAGNOSIS — R011 Cardiac murmur, unspecified: Secondary | ICD-10-CM

## 2022-09-21 DIAGNOSIS — E222 Syndrome of inappropriate secretion of antidiuretic hormone: Secondary | ICD-10-CM

## 2022-09-21 DIAGNOSIS — G320 Subacute combined degeneration of spinal cord in diseases classified elsewhere: Secondary | ICD-10-CM

## 2022-09-21 LAB — CBC WITH DIFFERENTIAL/PLATELET
Basophils Absolute: 0.1 10*3/uL (ref 0.0–0.1)
Basophils Relative: 1 % (ref 0.0–3.0)
Eosinophils Absolute: 0.1 10*3/uL (ref 0.0–0.7)
Eosinophils Relative: 2.6 % (ref 0.0–5.0)
HCT: 32.7 % — ABNORMAL LOW (ref 36.0–46.0)
Hemoglobin: 10.8 g/dL — ABNORMAL LOW (ref 12.0–15.0)
Lymphocytes Relative: 32.3 % (ref 12.0–46.0)
Lymphs Abs: 1.8 10*3/uL (ref 0.7–4.0)
MCHC: 32.9 g/dL (ref 30.0–36.0)
MCV: 92.4 fl (ref 78.0–100.0)
Monocytes Absolute: 0.6 10*3/uL (ref 0.1–1.0)
Monocytes Relative: 11.3 % (ref 3.0–12.0)
Neutro Abs: 2.9 10*3/uL (ref 1.4–7.7)
Neutrophils Relative %: 52.8 % (ref 43.0–77.0)
Platelets: 345 10*3/uL (ref 150.0–400.0)
RBC: 3.54 Mil/uL — ABNORMAL LOW (ref 3.87–5.11)
RDW: 13.1 % (ref 11.5–15.5)
WBC: 5.6 10*3/uL (ref 4.0–10.5)

## 2022-09-21 LAB — URINALYSIS, ROUTINE W REFLEX MICROSCOPIC
Bilirubin Urine: NEGATIVE
Hgb urine dipstick: NEGATIVE
Ketones, ur: NEGATIVE
Nitrite: NEGATIVE
RBC / HPF: NONE SEEN (ref 0–?)
Specific Gravity, Urine: 1.015 (ref 1.000–1.030)
Total Protein, Urine: NEGATIVE
Urine Glucose: NEGATIVE
Urobilinogen, UA: 0.2 (ref 0.0–1.0)
pH: 6 (ref 5.0–8.0)

## 2022-09-21 LAB — LIPID PANEL
Cholesterol: 242 mg/dL — ABNORMAL HIGH (ref 0–200)
HDL: 70.7 mg/dL (ref >39.00)
LDL Cholesterol: 152 mg/dL — ABNORMAL HIGH (ref 0–99)
NonHDL: 171.41
Total CHOL/HDL Ratio: 3
Triglycerides: 96 mg/dL (ref 0.0–149.0)
VLDL: 19.2 mg/dL (ref 0.0–40.0)

## 2022-09-21 LAB — BASIC METABOLIC PANEL
BUN: 19 mg/dL (ref 6–23)
CO2: 31 meq/L (ref 19–32)
Calcium: 9.2 mg/dL (ref 8.4–10.5)
Chloride: 97 meq/L (ref 96–112)
Creatinine, Ser: 0.6 mg/dL (ref 0.40–1.20)
GFR: 98.12 mL/min (ref >60.00)
Glucose, Bld: 102 mg/dL — ABNORMAL HIGH (ref 70–99)
Potassium: 3.5 meq/L (ref 3.5–5.1)
Sodium: 136 meq/L (ref 135–145)

## 2022-09-21 NOTE — Patient Instructions (Signed)
Hypertension, Adult High blood pressure (hypertension) is when the force of blood pumping through the arteries is too strong. The arteries are the blood vessels that carry blood from the heart throughout the body. Hypertension forces the heart to work harder to pump blood and may cause arteries to become narrow or stiff. Untreated or uncontrolled hypertension can lead to a heart attack, heart failure, a stroke, kidney disease, and other problems. A blood pressure reading consists of a higher number over a lower number. Ideally, your blood pressure should be below 120/80. The first ("top") number is called the systolic pressure. It is a measure of the pressure in your arteries as your heart beats. The second ("bottom") number is called the diastolic pressure. It is a measure of the pressure in your arteries as the heart relaxes. What are the causes? The exact cause of this condition is not known. There are some conditions that result in high blood pressure. What increases the risk? Certain factors may make you more likely to develop high blood pressure. Some of these risk factors are under your control, including: Smoking. Not getting enough exercise or physical activity. Being overweight. Having too much fat, sugar, calories, or salt (sodium) in your diet. Drinking too much alcohol. Other risk factors include: Having a personal history of heart disease, diabetes, high cholesterol, or kidney disease. Stress. Having a family history of high blood pressure and high cholesterol. Having obstructive sleep apnea. Age. The risk increases with age. What are the signs or symptoms? High blood pressure may not cause symptoms. Very high blood pressure (hypertensive crisis) may cause: Headache. Fast or irregular heartbeats (palpitations). Shortness of breath. Nosebleed. Nausea and vomiting. Vision changes. Severe chest pain, dizziness, and seizures. How is this diagnosed? This condition is diagnosed by  measuring your blood pressure while you are seated, with your arm resting on a flat surface, your legs uncrossed, and your feet flat on the floor. The cuff of the blood pressure monitor will be placed directly against the skin of your upper arm at the level of your heart. Blood pressure should be measured at least twice using the same arm. Certain conditions can cause a difference in blood pressure between your right and left arms. If you have a high blood pressure reading during one visit or you have normal blood pressure with other risk factors, you may be asked to: Return on a different day to have your blood pressure checked again. Monitor your blood pressure at home for 1 week or longer. If you are diagnosed with hypertension, you may have other blood or imaging tests to help your health care provider understand your overall risk for other conditions. How is this treated? This condition is treated by making healthy lifestyle changes, such as eating healthy foods, exercising more, and reducing your alcohol intake. You may be referred for counseling on a healthy diet and physical activity. Your health care provider may prescribe medicine if lifestyle changes are not enough to get your blood pressure under control and if: Your systolic blood pressure is above 130. Your diastolic blood pressure is above 80. Your personal target blood pressure may vary depending on your medical conditions, your age, and other factors. Follow these instructions at home: Eating and drinking  Eat a diet that is high in fiber and potassium, and low in sodium, added sugar, and fat. An example of this eating plan is called the DASH diet. DASH stands for Dietary Approaches to Stop Hypertension. To eat this way: Eat   plenty of fresh fruits and vegetables. Try to fill one half of your plate at each meal with fruits and vegetables. Eat whole grains, such as whole-wheat pasta, brown rice, or whole-grain bread. Fill about one  fourth of your plate with whole grains. Eat or drink low-fat dairy products, such as skim milk or low-fat yogurt. Avoid fatty cuts of meat, processed or cured meats, and poultry with skin. Fill about one fourth of your plate with lean proteins, such as fish, chicken without skin, beans, eggs, or tofu. Avoid pre-made and processed foods. These tend to be higher in sodium, added sugar, and fat. Reduce your daily sodium intake. Many people with hypertension should eat less than 1,500 mg of sodium a day. Do not drink alcohol if: Your health care provider tells you not to drink. You are pregnant, may be pregnant, or are planning to become pregnant. If you drink alcohol: Limit how much you have to: 0-1 drink a day for women. 0-2 drinks a day for men. Know how much alcohol is in your drink. In the [place] one drink equals one 12 oz bottle of beer (355 mL), one 5 oz glass of wine (148 mL), or one 1 oz glass of hard liquor (44 mL). Lifestyle  Work with your health care provider to maintain a healthy body weight or to lose weight. Ask what an ideal weight is for you. Get at least 30 minutes of exercise that causes your heart to beat faster (aerobic exercise) most days of the week. Activities may include walking, swimming, or biking. Include exercise to strengthen your muscles (resistance exercise), such as Pilates or lifting weights, as part of your weekly exercise routine. Try to do these types of exercises for 30 minutes at least 3 days a week. Do not use any products that contain nicotine or tobacco. These products include cigarettes, chewing tobacco, and vaping devices, such as e-cigarettes. If you need help quitting, ask your health care provider. Monitor your blood pressure at home as told by your health care provider. Keep all follow-up visits. This is important. Medicines Take over-the-counter and prescription medicines only as told by your health care provider. Follow directions carefully. Blood  pressure medicines must be taken as prescribed. Do not skip doses of blood pressure medicine. Doing this puts you at risk for problems and can make the medicine less effective. Ask your health care provider about side effects or reactions to medicines that you should watch for. Contact a health care provider if you: Think you are having a reaction to a medicine you are taking. Have headaches that keep coming back (recurring). Feel dizzy. Have swelling in your ankles. Have trouble with your vision. Get help right away if you: Develop a severe headache or confusion. Have unusual weakness or numbness. Feel faint. Have severe pain in your chest or abdomen. Vomit repeatedly. Have trouble breathing. These symptoms may be an emergency. Get help right away. Call 911. Do not wait to see if the symptoms will go away. Do not drive yourself to the hospital. Summary Hypertension is when the force of blood pumping through your arteries is too strong. If this condition is not controlled, it may put you at risk for serious complications. Your personal target blood pressure may vary depending on your medical conditions, your age, and other factors. For most people, a normal blood pressure is less than 120/80. Hypertension is treated with lifestyle changes, medicines, or a combination of both. Lifestyle changes include losing weight, eating a healthy,   low-sodium diet, exercising more, and limiting alcohol. This information is not intended to replace advice given to you by your health care provider. Make sure you discuss any questions you have with your health care provider. Document Revised: 11/24/2020 Document Reviewed: 11/24/2020 Elsevier Patient Education  2024 Elsevier Inc.  

## 2022-09-21 NOTE — Progress Notes (Signed)
Subjective:  Patient ID: Cheryl Taylor, female    DOB: 01/10/1966  Age: 57 y.o. MRN: [id number]  CC: Hypertension   HPI Cheryl Taylor presents for f/up ----  Discussed the use of AI scribe software for clinical note transcription with the patient, who gave verbal consent to proceed.  History of Present Illness   The patient presents with a chief complaint of numbness, tingling, and weakness in both arms, more pronounced on the right side, extending from the hands to the upper arms. These symptoms are most severe upon waking and have been ongoing for a couple of months. The patient denies any associated neck pain or injury that could have precipitated these symptoms.  In addition to the primary complaint, the patient reports blurred vision, even while wearing reading glasses, despite having undergone Lasik surgery. They also describe an unusual symptom of involuntary drooling, occurring approximately once a day, without any associated facial weakness.  The patient has also been experiencing balance issues, describing a sense of clumsiness and a tendency to bump into things. They have noticed tremors in their hands, which have become so severe that they struggle to hold objects steady.  The patient also reports persistent skin itchiness, a feeling of constant nausea, and occasional vomiting. They have noticed that their wounds are healing slower than usual. They have also been experiencing constipation, which they have been managing with prunes.  The patient has a history of hypertension and is currently on medication for it. They have noticed that their blood pressure has been elevated recently. They also have a known heart murmur and are considering an echocardiogram due to a family history of ASD.       Outpatient Medications Prior to Visit  Medication Sig Dispense Refill   amLODipine (NORVASC) 10 MG tablet Take 1 tablet (10 mg total) by mouth daily. 90 tablet 0   carvedilol (COREG  CR) 10 MG 24 hr capsule Take 1 capsule (10 mg total) by mouth daily. 90 capsule 0   demeclocycline (DECLOMYCIN) 150 MG tablet TAKE 1 TABLET BY MOUTH TWICE A DAY 180 tablet 0   escitalopram (LEXAPRO) 20 MG tablet Take 1 tablet (20 mg total) by mouth daily. 90 tablet 0   folic acid (FOLVITE) 1 MG tablet Take 1 tablet (1 mg total) by mouth daily. 90 tablet 1   hydrALAZINE (APRESOLINE) 25 MG tablet TAKE 1 TABLET BY MOUTH THREE TIMES A DAY 270 tablet 0   omeprazole (PRILOSEC) 20 MG capsule Take 1 capsule (20 mg total) by mouth daily. 90 capsule 1   spironolactone (ALDACTONE) 25 MG tablet Take 1 tablet (25 mg total) by mouth daily. 90 tablet 0   traZODone (DESYREL) 50 MG tablet Take 2 tablets (100 mg total) by mouth as needed. 180 tablet 1   valACYclovir (VALTREX) 1000 MG tablet Take 1 tablet by mouth daily 90 tablet 0   Armodafinil 250 MG tablet Take 1 tablet (250 mg total) by mouth daily. 90 tablet 0   No facility-administered medications prior to visit.    ROS Review of Systems  Constitutional:  Negative for appetite change, chills, diaphoresis, fatigue and unexpected weight change.  HENT: Negative.    Eyes:  Positive for visual disturbance.  Respiratory:  Negative for cough, chest tightness, shortness of breath and wheezing.   Cardiovascular:  Negative for chest pain, palpitations and leg swelling.  Gastrointestinal:  Positive for constipation and nausea. Negative for abdominal pain and vomiting.  Genitourinary: Negative.  Negative for decreased  urine volume, difficulty urinating, dysuria and urgency.  Musculoskeletal: Negative.  Negative for arthralgias and myalgias.  Skin: Negative.   Neurological:  Positive for tremors, weakness and numbness. Negative for dizziness, syncope, speech difficulty and light-headedness.  Hematological:  Negative for adenopathy. Does not bruise/bleed easily.  Psychiatric/Behavioral: Negative.      Objective:  BP (!) 152/78 (BP Location: Right Arm, Patient  Position: Sitting, Cuff Size: Large)   Pulse 60   Temp 98.1 F (36.7 C) (Oral)   Resp 16   Ht 5\' 4"  (1.626 m)   Wt 171 lb (77.6 kg)   LMP 11/13/2017   SpO2 95%   BMI 29.35 kg/m   BP Readings from Last 3 Encounters:  09/21/22 (!) 152/78  05/26/22 (!) 144/88  04/15/22 138/82    Wt Readings from Last 3 Encounters:  09/21/22 171 lb (77.6 kg)  05/26/22 176 lb (79.8 kg)  04/15/22 185 lb (83.9 kg)    Physical Exam Vitals reviewed.  Constitutional:      Appearance: Normal appearance. She is not ill-appearing.  HENT:     Nose: Nose normal.     Mouth/Throat:     Mouth: Mucous membranes are moist.  Eyes:     General: No scleral icterus.    Conjunctiva/sclera: Conjunctivae normal.  Cardiovascular:     Rate and Rhythm: Regular rhythm. Bradycardia present.     Heart sounds: Murmur heard.     Systolic murmur is present with a grade of 1/6.     No diastolic murmur is present.     No friction rub. No gallop.     Comments: EKG- SB, 56 bpm No LVH, Q waves, or ST/T waves  Unchanged  Pulmonary:     Effort: Pulmonary effort is normal.     Breath sounds: No stridor. No wheezing, rhonchi or rales.  Abdominal:     General: Abdomen is flat.     Palpations: There is no mass.     Tenderness: There is no abdominal tenderness. There is no guarding.     Hernia: No hernia is present.  Musculoskeletal:     Cervical back: Neck supple.     Right lower leg: No edema.     Left lower leg: No edema.  Lymphadenopathy:     Cervical: No cervical adenopathy.  Skin:    General: Skin is warm and dry.     Findings: No rash.  Neurological:     General: No focal deficit present.     Mental Status: She is alert. Mental status is at baseline.     Sensory: Sensation is intact.     Motor: Motor function is intact.     Coordination: Coordination is intact.     Gait: Gait is intact.     Deep Tendon Reflexes:     Reflex Scores:      Tricep reflexes are 1+ on the right side and 1+ on the left side.       Bicep reflexes are 1+ on the right side and 1+ on the left side.      Brachioradialis reflexes are 1+ on the right side and 1+ on the left side.      Patellar reflexes are 2+ on the right side and 2+ on the left side.      Achilles reflexes are 1+ on the right side and 1+ on the left side. Psychiatric:        Mood and Affect: Mood normal.        Behavior:  Behavior normal.     Lab Results  Component Value Date   WBC 5.6 09/21/2022   HGB 10.8 (L) 09/21/2022   HCT 32.7 (L) 09/21/2022   PLT 345.0 09/21/2022   GLUCOSE 102 (H) 09/21/2022   CHOL 242 (H) 09/21/2022   TRIG 96.0 09/21/2022   HDL 70.70 09/21/2022   LDLDIRECT 149.0 07/30/2019   LDLCALC 152 (H) 09/21/2022   ALT 19 12/08/2020   AST 17 12/08/2020   NA 136 09/21/2022   K 3.5 09/21/2022   CL 97 09/21/2022   CREATININE 0.60 09/21/2022   BUN 19 09/21/2022   CO2 31 09/21/2022   TSH 0.91 05/26/2022   HGBA1C 5.9 12/08/2020    US Renal Artery Stenosis  Result Date: 10/14/2021 CLINICAL DATA:  Hypertension, refractory EXAM: RENAL/URINARY TRACT ULTRASOUND RENAL DUPLEX DOPPLER ULTRASOUND COMPARISON:  None Available. FINDINGS: Right Kidney: Length: 11.4 cm. Increased echogenicity of the renal cortex. No mass or hydronephrosis visualized. Left Kidney: Length: 12.1 cm. Increased echogenicity of the renal cortex. No mass or hydronephrosis visualized. Bladder:  Within normal limits. RENAL DUPLEX ULTRASOUND Right Renal Artery Velocities: Origin:  147 cm/sec Mid:  107 cm/sec Hilum:  1 1 cm/sec Interlobar:  64 cm/sec Arcuate:  39 cm/sec, normal resistive indices ranging 0.72-0.78 Left Renal Artery Velocities: Origin:  104 cm/sec Mid:  103 cm/sec Hilum:  133 cm/sec Interlobar:  57 cm/sec Arcuate:  37 cm/sec, normal resistive indices ranging from 0.69-0.80 Aortic Velocity:  98 cm/sec Right Renal-Aortic Ratios: Origin: 1.5 Mid:  1.1 Hilum: 1.1 Interlobar: 0.7 Arcuate: 0.4 Left Renal-Aortic Ratios: Origin: 1.1 Mid: 1.1 Hilum: 1.4 Interlobar: 0.7  Arcuate: 0.4 IMPRESSION: 1. Normal vascular examination of the bilateral renal arteries. No findings of renal artery stenosis. 2. Bilateral kidneys demonstrate increased cortical echogenicity, nonspecific but suggestive of underlying medical renal disease. Electronically Signed   By: Olive Bass M.D.   On: 10/14/2021 12:01    Assessment & Plan:  Coarse tremors -     Ambulatory referral to Neurology  Numbness and tingling -     Vitamin B12; Future -     CBC with Differential/Platelet; Future -     Folate; Future -     Vitamin B1; Future  Blurred vision, bilateral -     Vitamin B12; Future -     Folate; Future -     Vitamin B1; Future  Essential hypertension -     Urinalysis, Routine w reflex microscopic; Future -     Basic metabolic panel; Future -     EKG 12-Lead  Dyslipidemia, goal LDL below 130 - Statin is not indicated. 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     Follow-up: Return in about 3 months (around 12/22/2022).  Sanda Linger, MD

## 2022-09-22 LAB — SODIUM, URINE, RANDOM: Sodium, Ur: 52 mmol/L (ref 28–272)

## 2022-09-23 DIAGNOSIS — E538 Deficiency of other specified B group vitamins: Secondary | ICD-10-CM | POA: Insufficient documentation

## 2022-09-23 LAB — VITAMIN B12: Vitamin B-12: 173 pg/mL — ABNORMAL LOW (ref 211–911)

## 2022-09-23 LAB — FOLATE: Folate: >24.2 ng/mL (ref >5.9)

## 2022-09-24 LAB — VITAMIN B1: Vitamin B1 (Thiamine): 9 nmol/L (ref 8–30)

## 2022-09-25 ENCOUNTER — Encounter: Payer: Self-pay | Admitting: Internal Medicine

## 2022-09-27 ENCOUNTER — Other Ambulatory Visit: Payer: Self-pay | Admitting: Family Medicine

## 2022-09-27 DIAGNOSIS — R001 Bradycardia, unspecified: Secondary | ICD-10-CM

## 2022-09-27 DIAGNOSIS — R011 Cardiac murmur, unspecified: Secondary | ICD-10-CM

## 2022-09-28 ENCOUNTER — Ambulatory Visit: Payer: Medicaid Other

## 2022-09-28 DIAGNOSIS — D513 Other dietary vitamin B12 deficiency anemia: Secondary | ICD-10-CM | POA: Diagnosis not present

## 2022-09-28 MED ORDER — CYANOCOBALAMIN 1000 MCG/ML IJ SOLN
1000.0000 ug | Freq: Once | INTRAMUSCULAR | Status: AC
Start: 2022-09-28 — End: 2022-09-28
  Administered 2022-09-28: 1000 ug via INTRAMUSCULAR

## 2022-09-28 NOTE — Progress Notes (Signed)
B12 given.  Pt tolerated well. Pt is aware to give the office a call for an side effects or reactions. Please co-sign.   

## 2022-09-30 ENCOUNTER — Other Ambulatory Visit: Payer: Self-pay | Admitting: Internal Medicine

## 2022-09-30 DIAGNOSIS — D528 Other folate deficiency anemias: Secondary | ICD-10-CM

## 2022-10-01 ENCOUNTER — Encounter: Payer: Self-pay | Admitting: Internal Medicine

## 2022-10-05 ENCOUNTER — Other Ambulatory Visit: Payer: Self-pay | Admitting: Internal Medicine

## 2022-10-05 DIAGNOSIS — F5104 Psychophysiologic insomnia: Secondary | ICD-10-CM

## 2022-10-05 DIAGNOSIS — K219 Gastro-esophageal reflux disease without esophagitis: Secondary | ICD-10-CM

## 2022-10-05 DIAGNOSIS — F331 Major depressive disorder, recurrent, moderate: Secondary | ICD-10-CM

## 2022-10-05 DIAGNOSIS — I10 Essential (primary) hypertension: Secondary | ICD-10-CM

## 2022-10-06 ENCOUNTER — Ambulatory Visit (INDEPENDENT_AMBULATORY_CARE_PROVIDER_SITE_OTHER): Payer: Medicaid Other

## 2022-10-06 DIAGNOSIS — D513 Other dietary vitamin B12 deficiency anemia: Secondary | ICD-10-CM | POA: Diagnosis not present

## 2022-10-06 MED ORDER — CYANOCOBALAMIN 1000 MCG/ML IJ SOLN
1000.0000 ug | Freq: Once | INTRAMUSCULAR | Status: AC
Start: 2022-10-06 — End: 2022-10-06
  Administered 2022-10-06: 1000 ug via INTRAMUSCULAR

## 2022-10-06 NOTE — Progress Notes (Signed)
Pt here for monthly B12 injection per Dr. Yetta Barre.  B12 given IM and pt tolerated injection well.  Next B12 injection scheduled for 10/12/2022  Patient was advised to report to the office immediately if she notices any adverse reaction.

## 2022-10-07 ENCOUNTER — Other Ambulatory Visit: Payer: Self-pay | Admitting: Internal Medicine

## 2022-10-07 DIAGNOSIS — I10 Essential (primary) hypertension: Secondary | ICD-10-CM

## 2022-10-07 DIAGNOSIS — E222 Syndrome of inappropriate secretion of antidiuretic hormone: Secondary | ICD-10-CM

## 2022-10-08 ENCOUNTER — Encounter: Payer: Self-pay | Admitting: Internal Medicine

## 2022-10-10 ENCOUNTER — Other Ambulatory Visit: Payer: Self-pay | Admitting: Internal Medicine

## 2022-10-10 ENCOUNTER — Encounter: Payer: Self-pay | Admitting: Internal Medicine

## 2022-10-12 ENCOUNTER — Ambulatory Visit: Payer: Medicaid Other | Admitting: Internal Medicine

## 2022-10-12 ENCOUNTER — Ambulatory Visit (INDEPENDENT_AMBULATORY_CARE_PROVIDER_SITE_OTHER): Payer: Medicaid Other

## 2022-10-12 ENCOUNTER — Other Ambulatory Visit: Payer: Self-pay | Admitting: Internal Medicine

## 2022-10-12 DIAGNOSIS — D513 Other dietary vitamin B12 deficiency anemia: Secondary | ICD-10-CM

## 2022-10-12 MED ORDER — CYANOCOBALAMIN 1000 MCG/ML IJ SOLN
1000.0000 ug | Freq: Once | INTRAMUSCULAR | Status: AC
Start: 2022-10-12 — End: 2022-10-12
  Administered 2022-10-12: 1000 ug via INTRAMUSCULAR

## 2022-10-12 NOTE — Progress Notes (Signed)
Pt here for monthly B12 injection per   B12 given IM and pt tolerated injection well.  Next B12 injection scheduled for

## 2022-10-19 ENCOUNTER — Ambulatory Visit (INDEPENDENT_AMBULATORY_CARE_PROVIDER_SITE_OTHER): Payer: Medicaid Other

## 2022-10-19 DIAGNOSIS — E538 Deficiency of other specified B group vitamins: Secondary | ICD-10-CM | POA: Diagnosis not present

## 2022-10-19 MED ORDER — CYANOCOBALAMIN 1000 MCG/ML IJ SOLN
1000.0000 ug | Freq: Once | INTRAMUSCULAR | Status: AC
Start: 2022-10-19 — End: 2022-10-19
  Administered 2022-10-19: 1000 ug via INTRAMUSCULAR

## 2022-10-19 NOTE — Progress Notes (Signed)
After obtaining consent, and per orders of Dr. Yetta Barre, injection of B12 given by Ferdie Ping. Patient instructed to report any adverse reaction to me immediately.

## 2022-10-23 ENCOUNTER — Encounter: Payer: Self-pay | Admitting: Internal Medicine

## 2022-10-24 ENCOUNTER — Other Ambulatory Visit (HOSPITAL_BASED_OUTPATIENT_CLINIC_OR_DEPARTMENT_OTHER): Payer: Medicaid Other

## 2022-10-24 ENCOUNTER — Other Ambulatory Visit: Payer: Self-pay | Admitting: Internal Medicine

## 2022-10-24 DIAGNOSIS — D513 Other dietary vitamin B12 deficiency anemia: Secondary | ICD-10-CM

## 2022-10-24 MED ORDER — CYANOCOBALAMIN 2000 MCG PO TABS
2000.0000 ug | ORAL_TABLET | Freq: Every day | ORAL | 0 refills | Status: DC
Start: 2022-10-24 — End: 2022-10-25

## 2022-10-25 ENCOUNTER — Other Ambulatory Visit: Payer: Self-pay | Admitting: Internal Medicine

## 2022-10-25 DIAGNOSIS — E538 Deficiency of other specified B group vitamins: Secondary | ICD-10-CM

## 2022-10-25 DIAGNOSIS — D513 Other dietary vitamin B12 deficiency anemia: Secondary | ICD-10-CM

## 2022-10-25 MED ORDER — "SYRINGE/NEEDLE (DISP) 25G X 1"" 3 ML MISC": 0 refills | Status: DC

## 2022-10-25 MED ORDER — CYANOCOBALAMIN 1000 MCG/ML IJ SOLN
1000.0000 ug | INTRAMUSCULAR | 0 refills | Status: DC
Start: 2022-10-25 — End: 2022-11-23

## 2022-10-25 NOTE — Telephone Encounter (Signed)
Please send supplies for monthly B12 injections. I will order the B12.

## 2022-10-26 ENCOUNTER — Ambulatory Visit: Payer: Medicaid Other

## 2022-10-29 ENCOUNTER — Other Ambulatory Visit: Payer: Self-pay | Admitting: Internal Medicine

## 2022-10-29 DIAGNOSIS — G4733 Obstructive sleep apnea (adult) (pediatric): Secondary | ICD-10-CM

## 2022-10-29 DIAGNOSIS — G4710 Hypersomnia, unspecified: Secondary | ICD-10-CM

## 2022-11-01 ENCOUNTER — Other Ambulatory Visit (HOSPITAL_COMMUNITY): Payer: Self-pay

## 2022-11-03 ENCOUNTER — Other Ambulatory Visit (HOSPITAL_COMMUNITY): Payer: Self-pay

## 2022-11-03 ENCOUNTER — Institutional Professional Consult (permissible substitution) (HOSPITAL_BASED_OUTPATIENT_CLINIC_OR_DEPARTMENT_OTHER): Payer: Medicaid Other | Admitting: Family

## 2022-11-03 ENCOUNTER — Telehealth: Payer: Self-pay

## 2022-11-03 NOTE — Telephone Encounter (Signed)
*  Primary  Pharmacy Patient Advocate Encounter   Received notification from CoverMyMeds that prior authorization for Carvedilol Phosphate ER 10MG  er capsules  is required/requested.   Insurance verification completed.   The patient is insured through [facility] .   Per test claim: PA required; PA submitted to [employer] via CoverMyMeds Key/confirmation #/EOC [id number] Status is pending

## 2022-11-08 NOTE — Telephone Encounter (Signed)
Pharmacy Patient Advocate Encounter  Received notification from [facility] that Prior Authorization for Carvedilol Phosphate ER 10MG  er capsules has been DENIED.  Full denial letter will be uploaded to the media tab. See denial reason below.  Here are the policy requirements your request did not meet:  The request does not meet the [facility] Preferred Drug List (PDL) trial and failure criteria for the use of a non-preferred drug. To meet the criteria for approval, you must first try two of the following preferred drugs on the [employer] Preferred Drug List (PDL) (this is a list of covered drugs): Atenolol tablet (generic for Tenormin), carvedilol tablet (generic for Coreg), Hemangeol Solution, labetalol tablet (generic for Trandate), metoprolol succinate XL tablet (generic for Toprol XL), metoprolol tartrate capsule (generic for Inderal), Sorine Tablet, and sotalol tablet or AF tablet (generic for Betapace AF, Sorine) Our pharmacy records and information sent in by your doctor do not show that these drugs were tried and whether or not they helped your condition.  If you have already tried these drug and they caused side effects, did not work, or there is a reason that you cannot take these drugs, please ask your doctor to send use this information.  PA #/Case ID/Reference #: [id number]  Please be advised we currently do not have a Pharmacist to review denials, therefore you will need to process appeals accordingly as needed. Thanks for your support at this time. Contact for appeals are as follows: Phone: [phone], Fax: [phone]

## 2022-11-09 ENCOUNTER — Other Ambulatory Visit: Payer: Self-pay | Admitting: Internal Medicine

## 2022-11-09 DIAGNOSIS — E222 Syndrome of inappropriate secretion of antidiuretic hormone: Secondary | ICD-10-CM

## 2022-11-09 DIAGNOSIS — I10 Essential (primary) hypertension: Secondary | ICD-10-CM

## 2022-11-10 ENCOUNTER — Other Ambulatory Visit: Payer: Self-pay | Admitting: Internal Medicine

## 2022-11-10 DIAGNOSIS — G4710 Hypersomnia, unspecified: Secondary | ICD-10-CM

## 2022-11-10 DIAGNOSIS — G4733 Obstructive sleep apnea (adult) (pediatric): Secondary | ICD-10-CM

## 2022-11-15 ENCOUNTER — Encounter: Payer: Self-pay | Admitting: Internal Medicine

## 2022-11-16 ENCOUNTER — Other Ambulatory Visit: Payer: Self-pay | Admitting: Internal Medicine

## 2022-11-16 DIAGNOSIS — I10 Essential (primary) hypertension: Secondary | ICD-10-CM

## 2022-11-19 ENCOUNTER — Encounter: Payer: Self-pay | Admitting: Internal Medicine

## 2022-11-21 ENCOUNTER — Other Ambulatory Visit: Payer: Self-pay | Admitting: Internal Medicine

## 2022-11-21 DIAGNOSIS — E538 Deficiency of other specified B group vitamins: Secondary | ICD-10-CM

## 2022-11-21 MED ORDER — CYANOCOBALAMIN 1000 MCG/ML IJ KIT: 1.0000 | PACK | INTRAMUSCULAR | 3 refills | Status: DC

## 2022-11-22 ENCOUNTER — Telehealth: Payer: Self-pay

## 2022-11-22 NOTE — Progress Notes (Signed)
   Care Guide Note  11/22/2022 Name: Cheryl Taylor MRN: [id number] DOB: Aug 23, 1965  Referred by: Etta Grandchild, MD Reason for referral : Care Coordination (Outreach to schedule with Pharm d )   Cheryl Taylor is a 57 y.o. year old female who is a primary care patient of Etta Grandchild, MD. [name] was referred to the pharmacist for assistance related to HTN.    Successful contact was made with the patient to discuss pharmacy services including being ready for the pharmacist to call at least 5 minutes before the scheduled appointment time, to have medication bottles and any blood sugar or blood pressure readings ready for review. The patient agreed to meet with the pharmacist via with the pharmacist via telephone visit on (date/time).  11/23/2022  Penne Lash, RMA Care Guide [facility]  [place], [place] [address] Direct Dial: [phone] [email or web address]

## 2022-11-23 ENCOUNTER — Other Ambulatory Visit: Payer: Medicaid Other | Admitting: Pharmacist

## 2022-11-23 DIAGNOSIS — E538 Deficiency of other specified B group vitamins: Secondary | ICD-10-CM

## 2022-11-23 DIAGNOSIS — I10 Essential (primary) hypertension: Secondary | ICD-10-CM

## 2022-11-23 NOTE — Progress Notes (Unsigned)
11/23/2022 Name: Cheryl Taylor MRN: [id number] DOB: [birth year]  Chief Complaint  Patient presents with   Hypertension   Medication Management    Cheryl Taylor is a 57 y.o. year old female who presented for a telephone visit.   They were referred to the pharmacist by {referredtopharmacy:27270} for assistance in managing {referralreason:27271}.   Currently taking nebivolol. Carvedilol ER denied, need to send carvedilol 5 mg BID Hydralazine 25 mg once daily Recommended stop folic acid and start womens MVI BP at home 130/85 Irbesartan caused diarrhea Pins and needles feeling came back after going to Vit B12 monthly, wanting to go back to weekly 1000 mcg? Restart spironolactone  Subjective:  Care Team: Primary Care Provider: Etta Grandchild, MD ; Next Scheduled Visit: 12/21/2022   Medication Access/Adherence  Current Pharmacy:  [facility] [phone] - [place], [place] - [address] [place] [address] Phone: [phone] Fax: [phone]   Patient reports affordability concerns with their medications: No  Patient reports access/transportation concerns to their pharmacy: No  Patient reports adherence concerns with their medications:  No     Hypertension:  Current medications: *** Medications previously tried:   Patient {HAS/DOES NOT XBMW:41324} a validated, automated, upper arm home BP cuff Current blood pressure readings readings: ***  Patient {Actions; denies-reports:120008} hypotensive s/sx including ***dizziness, lightheadedness.  Patient {Actions; denies-reports:120008} hypertensive symptoms including ***headache, chest pain, shortness of breath  Current meal patterns: ***  Current physical activity: ***   B12 deficiency  Objective:  Lab Results  Component Value Date   HGBA1C 5.9 12/08/2020    Lab Results  Component Value Date   CREATININE 0.60 09/21/2022   BUN 19 09/21/2022   NA 136 09/21/2022   K  3.5 09/21/2022   CL 97 09/21/2022   CO2 31 09/21/2022    Lab Results  Component Value Date   CHOL 242 (H) 09/21/2022   HDL 70.70 09/21/2022   LDLCALC 152 (H) 09/21/2022   LDLDIRECT 149.0 07/30/2019   TRIG 96.0 09/21/2022   CHOLHDL 3 09/21/2022    Medications Reviewed Today     Reviewed by Bonita Quin, RPH (Pharmacist) on 11/23/22 at 1517  Med List Status: <None>   Medication Order Taking? Sig Documenting Provider Last Dose Status Informant  amLODipine (NORVASC) 10 MG tablet [id number] Yes TAKE 1 TABLET BY MOUTH EVERY DAY Etta Grandchild, MD Taking Active   Armodafinil 250 MG tablet [id number] Yes TAKE 1 TABLET BY MOUTH EVERY DAY Etta Grandchild, MD Taking Active   Cholecalciferol (VITAMIN D3) 50 MCG (2000 UT) capsule [id number] Yes Take 2,000 Units by mouth daily. [provider] Taking Active Self  Cyanocobalamin 1000 MCG/ML KIT [id number] Yes Inject 1 Act as directed every 30 (thirty) days. Etta Grandchild, MD Taking Active   demeclocycline (DECLOMYCIN) 150 MG tablet [id number] Yes TAKE 1 TABLET BY MOUTH TWICE A DAY  Patient taking differently: Take 150 mg by mouth daily.   Etta Grandchild, MD Taking Active   escitalopram (LEXAPRO) 20 MG tablet [id number] Yes TAKE 1 TABLET BY MOUTH EVERY DAY Etta Grandchild, MD Taking Active   folic acid (FOLVITE) 1 MG tablet [id number] Yes TAKE 1 TABLET BY MOUTH EVERY DAY Etta Grandchild, MD Taking Active   hydrALAZINE (APRESOLINE) 25 MG tablet [id number] Yes TAKE 1 TABLET BY MOUTH THREE TIMES A DAY  Patient taking differently: Take 25 mg by mouth daily.   Etta Grandchild, MD Taking Active  omeprazole (PRILOSEC) 20 MG capsule [id number] Yes TAKE 1 CAPSULE BY MOUTH EVERY DAY Etta Grandchild, MD Taking Active   spironolactone (ALDACTONE) 25 MG tablet [id number] No TAKE 1 TABLET (25 MG TOTAL) BY MOUTH DAILY.  Patient not taking: Reported on 11/23/2022   Etta Grandchild, MD Not Taking Active   SYRINGE-NEEDLE, DISP, 3 ML 25G X 1" 3 ML MISC  [id number] Yes Use to administer B12 injection Etta Grandchild, MD Taking Active   torsemide (DEMADEX) 10 MG tablet [id number] Yes TAKE 1 TABLET BY MOUTH EVERY DAY Etta Grandchild, MD Taking Active   traZODone (DESYREL) 50 MG tablet [id number] Yes TAKE 2 TABLETS (100 MG TOTAL) BY MOUTH AS NEEDED. Etta Grandchild, MD Taking Active   valACYclovir (VALTREX) 1000 MG tablet [id number]  Take 1 tablet by mouth daily Etta Grandchild, MD  Active               Assessment/Plan:   {Pharmacy A/P Choices:26421}  Follow Up Plan: ***  ***

## 2022-11-24 MED ORDER — CYANOCOBALAMIN 1000 MCG/ML IJ KIT: 1.0000 | PACK | INTRAMUSCULAR | 1 refills | Status: DC

## 2022-11-24 MED ORDER — "SYRINGE 25G X 5/8"" 3 ML MISC": 0 refills | Status: DC

## 2022-11-24 MED ORDER — CARVEDILOL 6.25 MG PO TABS
6.2500 mg | ORAL_TABLET | Freq: Two times a day (BID) | ORAL | 3 refills | Status: DC
Start: 2022-11-24 — End: 2023-02-13

## 2022-11-24 NOTE — Patient Instructions (Signed)
It was a pleasure speaking with you today!  Restart your spironolactone daily and start carvedilol 6.25 mg twice daily. It is important to take the carvedilol twice daily - consider setting an alarm on your phone to remind yourself to take the second dose.  I have sent in Vitamin B12 to take weekly for up to 3 months since your symptoms worsened with the decreased frequency of doses. We will need to check blood work in the near future as well.  Please check blood pressure daily and keep a log so we can review your blood pressure readings on our next call and determine if medication changes are needed.   Feel free to call with any questions or concerns!  Arbutus Leas, PharmD, BCPS [facility] Clinical Pharmacist [facility] [phone]

## 2022-11-29 ENCOUNTER — Ambulatory Visit (HOSPITAL_BASED_OUTPATIENT_CLINIC_OR_DEPARTMENT_OTHER): Payer: Medicaid Other

## 2022-11-29 DIAGNOSIS — R011 Cardiac murmur, unspecified: Secondary | ICD-10-CM | POA: Diagnosis not present

## 2022-11-29 DIAGNOSIS — R001 Bradycardia, unspecified: Secondary | ICD-10-CM

## 2022-12-01 ENCOUNTER — Other Ambulatory Visit (HOSPITAL_COMMUNITY): Payer: Self-pay

## 2022-12-01 ENCOUNTER — Telehealth: Payer: Self-pay | Admitting: Pharmacy Technician

## 2022-12-01 LAB — ECHOCARDIOGRAM COMPLETE
AR max vel: 1.78 cm2
AV Area VTI: 1.86 cm2
AV Area mean vel: 1.71 cm2
AV Mean grad: 11 mm[Hg]
AV Peak grad: 19.5 mm[Hg]
Ao pk vel: 2.21 m/s
Area-P 1/2: 3.21 cm2
MV M vel: 5.1 m/s
MV Peak grad: 104 mm[Hg]
S' Lateral: 1.94 cm

## 2022-12-01 NOTE — Telephone Encounter (Signed)
Pharmacy Patient Advocate Encounter   Received notification from CoverMyMeds that prior authorization for Armodafinil 250MG  tablets is required/requested.   Insurance verification completed.   The patient is insured through [facility] .   Per test claim: PA required; PA started via CoverMyMeds. KEY [id number] . Waiting for clinical questions to populate.

## 2022-12-02 ENCOUNTER — Other Ambulatory Visit (HOSPITAL_COMMUNITY): Payer: Self-pay

## 2022-12-02 NOTE — Progress Notes (Signed)
Please address echo result. Result in my basket.

## 2022-12-03 ENCOUNTER — Other Ambulatory Visit: Payer: Self-pay | Admitting: Internal Medicine

## 2022-12-03 DIAGNOSIS — I340 Nonrheumatic mitral (valve) insufficiency: Secondary | ICD-10-CM | POA: Insufficient documentation

## 2022-12-03 DIAGNOSIS — I350 Nonrheumatic aortic (valve) stenosis: Secondary | ICD-10-CM | POA: Insufficient documentation

## 2022-12-03 DIAGNOSIS — I361 Nonrheumatic tricuspid (valve) insufficiency: Secondary | ICD-10-CM | POA: Insufficient documentation

## 2022-12-05 ENCOUNTER — Telehealth: Payer: Self-pay

## 2022-12-05 ENCOUNTER — Other Ambulatory Visit (HOSPITAL_COMMUNITY): Payer: Self-pay

## 2022-12-05 NOTE — Telephone Encounter (Signed)
Pharmacy Patient Advocate Encounter   Received notification from CoverMyMeds that prior authorization for Demeclocycline HCl 150MG  tablets is required/requested.   Insurance verification completed.   The patient is insured through [facility] .   Per test claim: PA required; PA started via CoverMyMeds. KEY [id number] . Waiting for clinical questions to populate.

## 2022-12-06 ENCOUNTER — Other Ambulatory Visit (HOSPITAL_COMMUNITY): Payer: Self-pay

## 2022-12-06 NOTE — Telephone Encounter (Signed)
Clinical questions answered. PA submitted

## 2022-12-07 ENCOUNTER — Ambulatory Visit: Payer: Medicaid Other | Attending: Cardiovascular Disease | Admitting: Cardiovascular Disease

## 2022-12-07 ENCOUNTER — Other Ambulatory Visit: Payer: Medicaid Other | Admitting: Pharmacist

## 2022-12-07 ENCOUNTER — Encounter: Payer: Self-pay | Admitting: Cardiovascular Disease

## 2022-12-07 ENCOUNTER — Other Ambulatory Visit (HOSPITAL_COMMUNITY): Payer: Self-pay

## 2022-12-07 VITALS — BP 164/81 | HR 68 | Ht 64.0 in | Wt 174.0 lb

## 2022-12-07 DIAGNOSIS — I5032 Chronic diastolic (congestive) heart failure: Secondary | ICD-10-CM

## 2022-12-07 DIAGNOSIS — I350 Nonrheumatic aortic (valve) stenosis: Secondary | ICD-10-CM

## 2022-12-07 DIAGNOSIS — E871 Hypo-osmolality and hyponatremia: Secondary | ICD-10-CM

## 2022-12-07 DIAGNOSIS — I2721 Secondary pulmonary arterial hypertension: Secondary | ICD-10-CM

## 2022-12-07 DIAGNOSIS — Z79899 Other long term (current) drug therapy: Secondary | ICD-10-CM | POA: Diagnosis not present

## 2022-12-07 DIAGNOSIS — I10 Essential (primary) hypertension: Secondary | ICD-10-CM | POA: Diagnosis not present

## 2022-12-07 DIAGNOSIS — R609 Edema, unspecified: Secondary | ICD-10-CM

## 2022-12-07 DIAGNOSIS — I1A0 Resistant hypertension: Secondary | ICD-10-CM

## 2022-12-07 DIAGNOSIS — I340 Nonrheumatic mitral (valve) insufficiency: Secondary | ICD-10-CM

## 2022-12-07 DIAGNOSIS — E222 Syndrome of inappropriate secretion of antidiuretic hormone: Secondary | ICD-10-CM

## 2022-12-07 DIAGNOSIS — G4733 Obstructive sleep apnea (adult) (pediatric): Secondary | ICD-10-CM

## 2022-12-07 DIAGNOSIS — E7800 Pure hypercholesterolemia, unspecified: Secondary | ICD-10-CM

## 2022-12-07 MED ORDER — TORSEMIDE 20 MG PO TABS: 20.0000 mg | ORAL_TABLET | Freq: Every day | ORAL | 3 refills | Status: DC

## 2022-12-07 NOTE — Progress Notes (Unsigned)
12/07/2022 Name: Cheryl Taylor MRN: [id number] DOB: 1965-05-17  Chief Complaint  Patient presents with   Hypertension   Medication Management    Cheryl Taylor is a 57 y.o. year old female who presented for a telephone visit.   They were referred to the pharmacist by their PCP for assistance in managing hypertension.    Subjective:  Care Team: Primary Care Provider: Etta Grandchild, MD ; Next Scheduled Visit: 12/21/2022   Medication Access/Adherence  Current Pharmacy:  [phone] - [place], [place] - [address] [place]  [address] Phone: [phone] Fax: [phone]   Patient reports affordability concerns with their medications: No  Patient reports access/transportation concerns to their pharmacy: No  Patient reports adherence concerns with their medications:  No     Hypertension:  Current medications: Amlodipine 10 mg daily, hydralazine 25 mg TID (pt only taking once daily), spironolactone 25 mg daily (recently restarted), torsemide 10 mg daily, nebivolol 10 mg daily, carvedilol 6.25 mg BID Medications previously tried: carvedilol XR was denied by insurance, irbesartan (diarrhea), valsartan, chlorthalidone  Patient has a validated, automated, upper arm home BP cuff Current blood pressure readings: 190/90 yesterday and a couple days ago  She notes she has an Echo done and it was found she has tricuspid and mitral valve regurgitation. She is seeing a cardiologist today.  Reporting edema in feet and lower legs - new in the past 1-2 weeks. Reports SOB with walking up stairs.   *Pt has hx of hyponatremia - prescribed demeclocycline for this  B12 deficiency: Current medication: Vitamin B12 1000 mcg weekly She notes the pins and needles feeling she was having resolved when she was getting weekly injections for the first month and they returned after changing to monthly injection. Symptoms improved going back to  weekly  She has been having trouble getting the Vit B12 solution out of the bottle due to small amount and syringe size. Asks if she can go back to coming into the office.  Objective:  Lab Results  Component Value Date   HGBA1C 5.9 12/08/2020    Lab Results  Component Value Date   CREATININE 0.60 09/21/2022   BUN 19 09/21/2022   NA 136 09/21/2022   K 3.5 09/21/2022   CL 97 09/21/2022   CO2 31 09/21/2022    Lab Results  Component Value Date   CHOL 242 (H) 09/21/2022   HDL 70.70 09/21/2022   LDLCALC 152 (H) 09/21/2022   LDLDIRECT 149.0 07/30/2019   TRIG 96.0 09/21/2022   CHOLHDL 3 09/21/2022    Medications Reviewed Today     Reviewed by Cheryl Taylor, RPH (Pharmacist) on 12/07/22 at 1437  Med List Status: <None>   Medication Order Taking? Sig Documenting Provider Last Dose Status Informant  amLODipine (NORVASC) 10 MG tablet [id number] Yes TAKE 1 TABLET BY MOUTH EVERY DAY Etta Grandchild, MD Taking Active   Armodafinil 250 MG tablet [id number]  TAKE 1 TABLET BY MOUTH EVERY DAY Etta Grandchild, MD  Active   carvedilol (COREG) 6.25 MG tablet [id number] Yes Take 1 tablet (6.25 mg total) by mouth 2 (two) times daily with a meal. Etta Grandchild, MD Taking Active   Cholecalciferol (VITAMIN D3) 50 MCG (2000 UT) capsule [id number]  Take 2,000 Units by mouth daily. [provider]  Active Self  Cyanocobalamin 1000 MCG/ML KIT [id number]  Inject 1 Act as directed once a week. Etta Grandchild, MD  Active   demeclocycline (DECLOMYCIN) 150 MG tablet [id number]  TAKE 1 TABLET BY MOUTH TWICE A DAY  Patient taking differently: Take 150 mg by mouth daily.   Etta Grandchild, MD  Active   escitalopram (LEXAPRO) 20 MG tablet [id number]  TAKE 1 TABLET BY MOUTH EVERY DAY Etta Grandchild, MD  Active   hydrALAZINE (APRESOLINE) 25 MG tablet [id number] Yes TAKE 1 TABLET BY MOUTH THREE TIMES A DAY  Patient taking differently: Take 25 mg by mouth daily.   Etta Grandchild, MD Taking Active    omeprazole (PRILOSEC) 20 MG capsule [id number]  TAKE 1 CAPSULE BY MOUTH EVERY DAY Etta Grandchild, MD  Active   spironolactone (ALDACTONE) 25 MG tablet [id number] Yes TAKE 1 TABLET (25 MG TOTAL) BY MOUTH DAILY. Etta Grandchild, MD Taking Active   SYRINGE-NEEDLE, DISP, 3 ML 25G X 1" 3 ML MISC [id number]  Use to administer B12 injection Etta Grandchild, MD  Active   Syringe/Needle, Disp, (SYRINGE 3CC/25GX5/8") 25G X 5/8" 3 ML MISC [id number]  Use to administer B12 injection weekly Etta Grandchild, MD  Active   torsemide (DEMADEX) 10 MG tablet [id number] Yes TAKE 1 TABLET BY MOUTH EVERY DAY Etta Grandchild, MD Taking Active   traZODone (DESYREL) 50 MG tablet [id number]  TAKE 2 TABLETS (100 MG TOTAL) BY MOUTH AS NEEDED. Etta Grandchild, MD  Active   valACYclovir (VALTREX) 1000 MG tablet [id number]  Take 1 tablet by mouth daily Etta Grandchild, MD  Active               Assessment/Plan:   Hypertension: - Currently uncontrolled, BP goal <130/80 - Reviewed appropriate blood pressure monitoring technique and reviewed goal blood pressure. Recommended to check home blood pressure and heart rate daily - Continue current regimen. Await cardio appt. Advised pt to mention elevated BP, edema, and SOB with exertion. Consider d/c hydralazine in future since patient is only taking once daily and TID will be difficult for her to adhere to. Consider d/c torsemide if no hx of edema due to hyponatremia. Consider adding an ACE/ARB.    B12 Deficiency: - Recommend resuming weekly Vitamin B12 1000 mcg for up to 3 months - Recommend repeating CBC - Will ask about office admin of Vit B12   Follow Up Plan:   Arbutus Leas, PharmD, BCPS Clinical Pharmacist [facility] [phone]

## 2022-12-07 NOTE — Telephone Encounter (Signed)
Pharmacy Patient Advocate Encounter  Received notification from [facility] that Prior Authorization for Demeclocycline 150mg  tabs has been APPROVED from 12/06/22 to 12/06/23   PA #/Case ID/Reference #: [id number]  Left a [facility]icemail at [facility] to notify of the approval. Per test claim the insurance will allow a 30 days supply for $4.

## 2022-12-07 NOTE — Telephone Encounter (Signed)
Patient is aware that her medication has been approved and she has already picked it up from her local pharmacy.

## 2022-12-07 NOTE — Patient Instructions (Signed)
Medication Instructions:  INCREASE TORSEMIDE TO 20 MG DAILY  PLEASE TAKE HYDRALAZINE 25 MG THREE TIMES A DAY AS PRESCRIBED *If you need a refill on your cardiac medications before your next appointment, please call your pharmacy*   Lab Work: BMP, BNP- Please return for Blood Work in [employer]. No appointment needed, lab here at the office is open Monday-Friday from 8AM to 4PM and closed daily for lunch from 12:45-1:45.   If you have labs (blood work) drawn today and your tests are completely normal, you will receive your results only by: MyChart Message (if you have MyChart) OR A paper copy in the mail If you have any lab test that is abnormal or we need to change your treatment, we will call you to review the results.   Testing/Procedures: KEEP A BP LOG- CHECK BP DAILY AND SEND LOG IN ONE WEEK THROUGH MYCHART   Follow-Up: At [facility], you and your health needs are our priority.  As part of our continuing mission to provide you with exceptional heart care, we have created designated Provider Care Teams.  These Care Teams include your primary Cardiologist (physician) and Advanced Practice Providers (APPs -  Physician Assistants and Nurse Practitioners) who all work together to provide you with the care you need, when you need it.  We recommend signing up for the patient portal called "MyChart".  Sign up information is provided on this After Visit Summary.  MyChart is used to connect with patients for Virtual Visits (Telemedicine).  Patients are able to view lab/test results, encounter notes, upcoming appointments, etc.  Non-urgent messages can be sent to your provider as well.   To learn more about what you can do with MyChart, go to [email or web address].    Your next appointment:   5 month(s)  Dr Royann Shivers

## 2022-12-08 ENCOUNTER — Telehealth: Payer: Self-pay | Admitting: Neurology

## 2022-12-08 ENCOUNTER — Ambulatory Visit (INDEPENDENT_AMBULATORY_CARE_PROVIDER_SITE_OTHER): Payer: Medicaid Other | Admitting: Neurology

## 2022-12-08 ENCOUNTER — Encounter: Payer: Self-pay | Admitting: Neurology

## 2022-12-08 VITALS — BP 138/92 | HR 88 | Ht 64.0 in | Wt 176.0 lb

## 2022-12-08 DIAGNOSIS — G4733 Obstructive sleep apnea (adult) (pediatric): Secondary | ICD-10-CM | POA: Diagnosis not present

## 2022-12-08 DIAGNOSIS — R270 Ataxia, unspecified: Secondary | ICD-10-CM | POA: Diagnosis not present

## 2022-12-08 DIAGNOSIS — G252 Other specified forms of tremor: Secondary | ICD-10-CM

## 2022-12-08 DIAGNOSIS — I119 Hypertensive heart disease without heart failure: Secondary | ICD-10-CM | POA: Diagnosis not present

## 2022-12-08 DIAGNOSIS — R2689 Other abnormalities of gait and mobility: Secondary | ICD-10-CM

## 2022-12-08 NOTE — Progress Notes (Signed)
Cardiology Office Note:    Date:  12/08/2022   ID:  Cheryl Taylor, DOB May 19, 1965, MRN [id number]  PCP:  Etta Grandchild, MD   [facility] Providers Cardiologist:  None     Referring MD: Etta Grandchild, MD   No chief complaint on file. Cheryl Taylor is a 57 y.o. female who is being seen today for the evaluation of dyspnea and murmur, abnormal echocardiogram at the request of Etta Grandchild, MD.   History of Present Illness:    Cheryl Taylor is a 57 y.o. female with a hx of difficult to control hypertension, OSA on CPAP, overweight, hypercholesterolemia, referred for evaluation after undergoing an echocardiogram showing multiple valvular abnormalities.  She has a longstanding history of problems with obesity, but has lost weight and is now in the overweight range.  She has had hypertension for as much is 20 years, and it has not always been well-controlled.  She is only taking hydralazine once a day instead of 3 times daily as prescribed.  She is on maximum dose amlodipine and recently had an adjustment in her dose of carvedilol and spironolactone was started a week ago.  She has sleep apnea and has been using CPAP for about 5 years, currently monitored by Dr. Vickey Huger in the neurology clinic. Renin/aldosterone values were normal in 2022 in 2023.  There was no evidence of renal artery stenosis by ultrasound in 2023.  She has normal renal function parameters.  She has always been rather sedentary.  She has recently noticed worsening dyspnea.  She has to stop halfway up 1 flight of stairs.  She denies orthopnea or PND.  She does not have chest pain at rest or with activity.  She is also noticed over the last 2 weeks that she has edema of the feet. She does not have claudication.  She denies problems with palpitations, dizziness or syncope.  She reports "very bad sleep".  Poor compliance with CPAP.  History of AHI of 39/hour.  She wakes up feeling tired and has significant  daytime hypersomnolence.  She does not have uncontrollable sleep, but if she relaxes for a few minutes in a chair can easily fall asleep.  She has problems with balance and feels clumsy.  She has problems with tremors in her upper extremities. She also describes numbness and paresthesias on the top of her feet.  These occur at rest and are not positional.    She has had a murmur "her whole life".  A previous echocardiogram in 2018 showed normal left ventricular systolic and diastolic function but showed a dilated left atrium.  There was no evidence of significant valvular abnormalities at the time.  She had a follow-up echocardiogram 11/29/2022.  This still has normal left ventricular systolic function, but the study was interpreted as showing moderate LVH and "indeterminate diastolic function" (E: A ratio 1.9, medial e' 6.85, lateral e' 11.5, E/e' 9-15).  There was mild-moderate mitral insufficiency, moderate tricuspid insufficiency and mild aortic stenosis (mean gradient 11 mmHg, dimensionless valve index normal at 0.66).  She has had problems with hyponatremia in 2021 and was prescribed demeclocycline.    Most recent sodium level was 136.  She does not have diabetes mellitus, but hemoglobin A1c is borderline at 5.9%.  She has hypercholesterolemia (cholesterol 242, HDL 70, LDL 152).  She has chronic mild normocytic anemia (hemoglobin around 11 for the last 4 years).  Although there was may be some evidence of iron deficiency in 2021,  most recent labs do not show evidence of iron deficiency. folate was low in March 2024, back to normal in August 2024.  She is concerned about a family history of a "hole in the heart" involving her brother, diagnosed after stroke.  Her sister had a myocardial infarction in her early 2s.  Her father had a valve replaced in his 68s and died at age 91.  She reports that all her female ancestors on her father 's side had strokes in their 55s - grandfather, great grandfather,  etc).  She has mild hypercholesterolemia with total cholesterol 242 and LDL 152, but with excellent HDL at 70.  She has no known CAD or PVD.  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  Past Medical History:  Diagnosis Date   Alcohol abuse, in remission    Anger    Anxiety    Arthritis    feet    Back pain    Chest pain    Deficiency anemia 07/31/2019   Depression    Drug use    Fatigue 02/15/2015   GERD (gastroesophageal reflux disease)    HTN (hypertension)    Hyperlipidemia    Obesity 08/10/2014   Obesity    Pain in both feet    arthritis / Hallox Rigidus   Perimenopausal 11/18/2013   Raynaud's disease    Screening for cervical cancer 02/06/2015   Sleep apnea    wears cpap    SOB (shortness of breath)    Vitamin D deficiency     Past Surgical History:  Procedure Laterality Date   CESAREAN SECTION  2003   TONSILLECTOMY  1980    Current Medications: Current Meds  Medication Sig   amLODipine (NORVASC) 10 MG tablet TAKE 1 TABLET BY MOUTH EVERY DAY   Armodafinil 250 MG tablet TAKE 1 TABLET BY MOUTH EVERY DAY   carvedilol (COREG) 6.25 MG tablet Take 1 tablet (6.25 mg total) by mouth 2 (two) times daily with a meal.   Cholecalciferol (VITAMIN D3) 50 MCG (2000 UT) capsule Take 2,000 Units by mouth daily.   Cyanocobalamin 1000 MCG/ML KIT Inject 1 Act as directed once a week.   escitalopram (LEXAPRO) 20 MG tablet TAKE 1 TABLET BY MOUTH EVERY DAY   hydrALAZINE (APRESOLINE) 25 MG tablet TAKE 1 TABLET BY MOUTH THREE TIMES A DAY (Patient taking differently: Take 25 mg by mouth daily.)   MULTIPLE VITAMIN PO Take by mouth.   omeprazole (PRILOSEC) 20 MG capsule TAKE 1 CAPSULE BY MOUTH EVERY DAY   spironolactone (ALDACTONE) 25 MG tablet TAKE 1 TABLET (25 MG TOTAL) BY MOUTH DAILY.   SYRINGE-NEEDLE, DISP, 3 ML 25G X 1" 3 ML MISC Use to administer B12 injection   Syringe/Needle, Disp, (SYRINGE 3CC/25GX5/8") 25G X 5/8" 3 ML MISC Use to administer B12 injection  weekly   Thiamine HCl (VITAMIN B-1) 250 MG tablet Take 250 mg by mouth daily.   traZODone (DESYREL) 50 MG tablet TAKE 2 TABLETS (100 MG TOTAL) BY MOUTH AS NEEDED.   valACYclovir (VALTREX) 1000 MG tablet Take 1 tablet by mouth daily   vitamin k 100 MCG tablet Take 1 tablet by mouth daily.   [DISCONTINUED] torsemide (DEMADEX) 10 MG tablet TAKE 1 TABLET BY MOUTH EVERY DAY     Allergies:   Patient has no known allergies.   Social History   Socioeconomic History   Marital status: Married    Spouse name: Joe   Number of children: 1   Years  of education: Not on file   Highest education level: Bachelor's degree (e.g., BA, AB, BS)  Occupational History   Occupation: Best boy     Comment: [employer]  Tobacco Use   Smoking status: Never   Smokeless tobacco: Never  Vaping Use   Vaping status: Never Used  Substance and Sexual Activity   Alcohol use: No    Alcohol/week: 50.0 standard drinks of alcohol    Types: 50 Glasses of wine per week    Comment: quit ETOH on 2013   Drug use: Yes    Types: Amphetamines    Comment: Ativan 4-5mg  daily, no longer taking   Sexual activity: Yes    Partners: Male    Birth control/protection: Pill    Comment: lives with husband and daughter, works at [employer], no dietary restrictions  Other Topics Concern   Not on file  Social History Narrative   Lives wih husband and daughter   Right handed   Caffeine: 1-2 cups a day   Social Determinants of Health   Financial Resource Strain: Low Risk  (05/23/2022)   Overall Financial Resource Strain (CARDIA)    Difficulty of Paying Living Expenses: Not hard at all  Food Insecurity: No Food Insecurity (05/23/2022)   Hunger Vital Sign    Worried About Running Out of Food in the Last Year: Never true    Ran Out of Food in the Last Year: Never true  Transportation Needs: No Transportation Needs (05/23/2022)   PRAPARE - Administrator, Civil Service (Medical): No    Lack of Transportation  (Non-Medical): No  Physical Activity: Unknown (05/23/2022)   Exercise Vital Sign    Days of Exercise per Week: 0 days    Minutes of Exercise per Session: Not on file  Stress: No Stress Concern Present (05/23/2022)   [employer] of Occupational Health - Occupational Stress Questionnaire    Feeling of Stress : Only a little  Social Connections: Moderately Integrated (05/23/2022)   Social Connection and Isolation Panel [NHANES]    Frequency of Communication with Friends and Family: Three times a week    Frequency of Social Gatherings with Friends and Family: Twice a week    Attends Religious Services: Never    Database administrator or Organizations: Yes    Attends Engineer, structural: More than 4 times per year    Marital Status: Married     Family History: The patient's family history includes Alcohol abuse in her brother and sister; Anxiety disorder in her mother; Depression in her mother; Heart disease (age of onset: 73) in her father; Hyperlipidemia in her father; Hypertension in her father; Kidney disease in her father; Kidney failure (age of onset: 93) in her father; Obesity in her father, mother, and paternal grandfather; Other in her brother; Schizophrenia in her brother; Seizures in her father; Sleep apnea in her father; Stroke in her brother, father, and sister. There is no history of Colon cancer, Colon polyps, Esophageal cancer, Rectal cancer, or Stomach cancer.  ROS:   Please see the history of present illness.     All other systems reviewed and are negative.  EKGs/Labs/Other Studies Reviewed:    The following studies were reviewed today:  EKG Interpretation Date/Time:  Wednesday December 07 2022 15:30:33 EST Ventricular Rate:  68 PR Interval:  162 QRS Duration:  88 QT Interval:  422 QTC Calculation: 448 R Axis:   31  Text Interpretation: Normal sinus rhythm Normal ECG When compared  with ECG of 17-Feb-2020 21:07, No significant change was found Confirmed  by Kamyah Wilhelmsen [phone]) on 12/07/2022 3:49:26 PM    Recent Labs: 05/26/2022: TSH 0.91 09/21/2022: BUN 19; Creatinine, Ser 0.60; Hemoglobin 10.8; Platelets 345.0; Potassium 3.5; Sodium 136  Recent Lipid Panel    Component Value Date/Time   CHOL 242 (H) 09/21/2022 1222   CHOL 190 08/29/2016 1110   TRIG 96.0 09/21/2022 1222   HDL 70.70 09/21/2022 1222   HDL 47 08/29/2016 1110   CHOLHDL 3 09/21/2022 1222   VLDL 19.2 09/21/2022 1222   LDLCALC 152 (H) 09/21/2022 1222   LDLCALC 123 (H) 08/29/2016 1110   LDLDIRECT 149.0 07/30/2019 1627     Risk Assessment/Calculations:      HYPERTENSION CONTROL Vitals:   12/07/22 1522 12/07/22 1600  BP: (!) 170/84 (!) 164/81    The patient's blood pressure is elevated above target today.  In order to address the patient's elevated BP: Blood pressure will be monitored at home to determine if medication changes need to be made.            Physical Exam:    VS:  BP (!) 164/81   Pulse 68   Ht 5\' 4"  (1.626 m)   Wt 174 lb (78.9 kg)   LMP 11/13/2017   SpO2 98%   BMI 29.87 kg/m     Wt Readings from Last 3 Encounters:  12/08/22 176 lb (79.8 kg)  12/07/22 174 lb (78.9 kg)  09/21/22 171 lb (77.6 kg)     GEN: Borderline obese, well nourished, well developed in no acute distress HEENT: Normal NECK: No clear evidence of JVD; No carotid bruits LYMPHATICS: No lymphadenopathy CARDIAC: RRR, grade 2/6 systolic murmur is heard at the left upper sternal border, no apical systolic or any diastolic murmurs, rubs, gallops RESPIRATORY:  Clear to auscultation without rales, wheezing or rhonchi  ABDOMEN: Soft, non-tender, non-distended MUSCULOSKELETAL: 1+ bilateral ankle and pretibial edema; No deformity  SKIN: Warm and dry NEUROLOGIC:  Alert and oriented x 3.  Resting and tremor, otherwise nonfocal. PSYCHIATRIC:  Normal affect   ASSESSMENT:    1. Essential hypertension   2. Chronic heart failure with preserved ejection fraction (HCC)   3.  Medication management   4. Non-rheumatic aortic stenosis   5. Nonrheumatic mitral valve regurgitation   6. OSA (obstructive sleep apnea)   7. PAH (pulmonary artery hypertension) (HCC)   8. Hyponatremia   9. Hypercholesterolemia    PLAN:    In order of problems listed above:  HTN: Poorly controlled.  She reports that she only just started spironolactone a week ago and that her dose of carvedilol was recently increased.  She was only taking hydralazine once a day in the morning.  She is on maximum dose of amlodipine.  Review of records shows that she was on chlorthalidone for many years from at least 2012 until 2019, subsequently indapamide in 2021, irbesartan in 2021 stopped when she developed hyponatremia.  Will try to avoid thiazide diuretics if possible.  Asked her to start taking the hydralazine 3 times daily as prescribed, wait for another week to see the effects of spironolactone and carvedilol, before we change her medications again.  Needs to be compliant with the CPAP. CHF: Dyspnea on exertion and lower extremity edema could have other explanations, but raise the concern for heart failure, specifically HFpEF.  On my review of her echocardiogram the only significant structural abnormality is the dilated left atrium ( in fact at least moderately dilated),  which would support a diagnosis of diastolic heart failure.  However, the mitral annulus velocities are actually quite normal.  Left ventricular systolic function is excellent, in fact hyperdynamic.  She seems to have high cardiac output, more than would be expected for her mild degree of anemia.  She does not have thyrotoxicosis.  Consider liver disease (most recent liver function tests are from 2022).  Will double the dose of torsemide to 20 mg daily, reevaluate sodium level, LFTs and check BNP. AS: I do not think she has true aortic stenosis.  Although the gradients across the aortic valve is slightly increased, the valve is  trileaflet/structurally normal, with normal leaflet motion.  There is no evidence of systolic anterior motion of the mitral valve.  I think the gradients are increased due to high cardiac output.  The cause for high cardiac output is not clear, consider liver disease. MR: She does have mitral insufficiency that is probably mild to moderate, with a central jet.  There are no obvious structural abnormalities of the mitral leaflets.  I cannot hear the murmur on exam.  There is systolic dominant flow across the pulmonic veins.  I do not think this valve problem is hemodynamically significant. OSA: Currently not compliant with therapy.  Has a visit with Dr. Vickey Huger tomorrow.  Suspect this explains a lot of her neurological complaints.  Probably also a major contributor to her pulmonary artery hypertension and our difficulty in controlling her systemic hypertension. PAH: She has mild pulmonary artery hypertension.  This could be due to left heart diastolic failure or a consequence of obstructive sleep apnea.  Reevaluate after increased diuretics. History of hyponatremia: This occurred while she was taking thiazide diuretics, ARB, SSRI.  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  Took demeclocycline for a while, but is no longer on it.  Currently has normal sodium levels. Hypercholesterolemia: No known evidence of CAD or PAD.  Elevated LDL, but also excellent HDL.  No treatment recommended for the time being.           Medication Adjustments/Labs and Tests Ordered: Current medicines are reviewed at length with the patient today.  Concerns regarding medicines are outlined above.  Orders Placed This Encounter  Procedures   Basic metabolic panel   Brain natriuretic peptide   EKG 12-Lead   Meds ordered this encounter  Medications   torsemide (DEMADEX) 20 MG tablet    Sig: Take 1 tablet (20 mg total) by mouth daily.    Dispense:  90 tablet    Refill:  3    Patient Instructions  Medication Instructions:   INCREASE TORSEMIDE TO 20 MG DAILY  PLEASE TAKE HYDRALAZINE 25 MG THREE TIMES A DAY AS PRESCRIBED *If you need a refill on your cardiac medications before your next appointment, please call your pharmacy*   Lab Work: BMP, BNP- Please return for Blood Work in [employer]. No appointment needed, lab here at the office is open Monday-Friday from 8AM to 4PM and closed daily for lunch from 12:45-1:45.   If you have labs (blood work) drawn today and your tests are completely normal, you will receive your results only by: MyChart Message (if you have MyChart) OR A paper copy in the mail If you have any lab test that is abnormal or we need to change your treatment, we will call you to review the results.   Testing/Procedures: KEEP A BP LOG- CHECK BP DAILY AND SEND LOG IN ONE WEEK THROUGH MYCHART   Follow-Up: At  [facility], you and your health needs are our priority.  As part of our continuing mission to provide you with exceptional heart care, we have created designated Provider Care Teams.  These Care Teams include your primary Cardiologist (physician) and Advanced Practice Providers (APPs -  Physician Assistants and Nurse Practitioners) who all work together to provide you with the care you need, when you need it.  We recommend signing up for the patient portal called "MyChart".  Sign up information is provided on this After Visit Summary.  MyChart is used to connect with patients for Virtual Visits (Telemedicine).  Patients are able to view lab/test results, encounter notes, upcoming appointments, etc.  Non-urgent messages can be sent to your provider as well.   To learn more about what you can do with MyChart, go to [email or web address].    Your next appointment:   5 month(s)  Dr Royann Shivers    Signed, Thurmon Fair, MD  12/08/2022 11:31 AM    [facility]

## 2022-12-08 NOTE — Telephone Encounter (Signed)
Pt stated at check out that she would like to know if PT referral can be sent to a facility in [facility] instead of [place]. Please advise, thank you!

## 2022-12-08 NOTE — Addendum Note (Signed)
Addended by: Melvyn Novas on: 12/08/2022 05:11 PM   Modules accepted: Orders

## 2022-12-08 NOTE — Progress Notes (Signed)
SLEEP MEDICINE CLINIC, BUT SEEN FOR NEW PROBLEM : TREMOR  Provider:  Melvyn Novas, MD  Primary Care Physician:  Etta Grandchild, MD [address] [place] [address]     Referring Provider: Etta Grandchild, Md [address] [place],  [place] [address]          Chief Complaint according to patient   Patient presents with:     New Patient (Initial Visit)           HISTORY OF PRESENT ILLNESS:  Cheryl Taylor is a 57 y.o. female patient who is here for NEW PROBLEM seen on  12/08/2022    A new symptom of Tremor.  Chief concern according to patient :  " I need to discuss some things evolving: cardiologist has seen me and wonders if my sleep apnea the cause of systolic failure of relaxation,  LVH".  She didn't bring her CPAP and has not been compliant.   Has a history of neuropathy, balance, blurred vision, left hand tremor and changes in penmanship, drooling, attention and focus problems' - I lost my last 2 jobs.  She is a recovering alcoholic. Previous MRI showed a lacune.  She is  sober 11 years now.    CLINICAL INFORMATION/HISTORY: HST  Started CPAP in 2017,no longer established with a sleep MD. DME is [facility]. She complains of extreme fatigue during the day. last week she was started on armodafinil. she has felt that has helped with the daytime fatigue.  Here for cpap management. Last seen on 02/28/19. Last ss done in 2017. Pt is needing a new CPAP, over 60 years old. No data uploading -pt has not been using regularly.     01-20-2021, TIWANNA Taylor is a 56 y.o. year old Caucasian female patient seen here in a Rv  on 01/20/2021. She is here to inquire if she can get a new CPAP machine - and she reports lack of compliance.  She stated her husband is going to bed early and she doesn't want to switch the CPAP on not to wake him up. She appreciates better sleep while using it. She had the machine over 5 years.  She is OK with a HST to establish a new baseline.  Patient used modafinil for hypersomnia/   Calculated pAHI (per hour):   39.2/h                          REM pAHI: 34.6/h                                               NREM pAHI:    39.9/h                   Review of Systems: Out of a complete 14 system review, the patient complains of only the following symptoms, and all other reviewed systems are negative.:  Fatigue, sleepiness ,tremor, drooling,   Left hand tremor: tremogram attached.     How likely are you to doze in the following situations: 0 = not likely, 1 = slight chance, 2 = moderate chance, 3 = high chance   Sitting and Reading? Watching Television? Sitting inactive in a public place (theater or meeting)? As a passenger in a car for an hour without a  break? Lying down in the afternoon when circumstances permit? Sitting and talking to someone? Sitting quietly after lunch without alcohol? In a car, while stopped for a few minutes in traffic?   Total = 10/ 24 points on armodafinil   FSS endorsed at 30/ 63 points.   Social History   Socioeconomic History   Marital status: Married    Spouse name: Joe   Number of children: 1   Years of education: Not on file   Highest education level: Bachelor's degree (e.g., BA, AB, BS)  Occupational History   Occupation: Best boy     Comment: [employer]  Tobacco Use   Smoking status: Never   Smokeless tobacco: Never  Vaping Use   Vaping status: Never Used  Substance and Sexual Activity   Alcohol use: No    Alcohol/week: 50.0 standard drinks of alcohol    Types: 50 Glasses of wine per week    Comment: quit ETOH on 2013   Drug use: Yes    Types: Amphetamines    Comment: Ativan 4-5mg  daily, no longer taking   Sexual activity: Yes    Partners: Male    Birth control/protection: Pill    Comment: lives with husband and daughter, works at [employer], no dietary restrictions  Other Topics Concern   Not on file  Social History Narrative   Lives wih husband and  daughter   Right handed   Caffeine: 1-2 cups a day   Social Determinants of Health   Financial Resource Strain: Low Risk  (05/23/2022)   Overall Financial Resource Strain (CARDIA)    Difficulty of Paying Living Expenses: Not hard at all  Food Insecurity: No Food Insecurity (05/23/2022)   Hunger Vital Sign    Worried About Running Out of Food in the Last Year: Never true    Ran Out of Food in the Last Year: Never true  Transportation Needs: No Transportation Needs (05/23/2022)   PRAPARE - Administrator, Civil Service (Medical): No    Lack of Transportation (Non-Medical): No  Physical Activity: Unknown (05/23/2022)   Exercise Vital Sign    Days of Exercise per Week: 0 days    Minutes of Exercise per Session: Not on file  Stress: No Stress Concern Present (05/23/2022)   [employer] of Occupational Health - Occupational Stress Questionnaire    Feeling of Stress : Only a little  Social Connections: Moderately Integrated (05/23/2022)   Social Connection and Isolation Panel [NHANES]    Frequency of Communication with Friends and Family: Three times a week    Frequency of Social Gatherings with Friends and Family: Twice a week    Attends Religious Services: Never    Database administrator or Organizations: Yes    Attends Engineer, structural: More than 4 times per year    Marital Status: Married    Family History  Problem Relation Age of Onset   Kidney failure Father 71       deceased   Kidney disease Father    Obesity Father    Seizures Father    Heart disease Father 10   Hypertension Father    Hyperlipidemia Father    Stroke Father    Sleep apnea Father    Schizophrenia Brother        52yo   Alcohol abuse Brother    Other Brother        chronic pain, 61 yo   Stroke Brother    Alcohol abuse Sister  45yo   Obesity Paternal Grandfather    Stroke Sister    Depression Mother    Anxiety disorder Mother    Obesity Mother    Colon cancer Neg Hx     Colon polyps Neg Hx    Esophageal cancer Neg Hx    Rectal cancer Neg Hx    Stomach cancer Neg Hx     Past Medical History:  Diagnosis Date   Alcohol abuse, in remission    Anger    Anxiety    Arthritis    feet    Back pain    Chest pain    Deficiency anemia 07/31/2019   Depression    Drug use    Fatigue 02/15/2015   GERD (gastroesophageal reflux disease)    HTN (hypertension)    Hyperlipidemia    Obesity 08/10/2014   Obesity    Pain in both feet    arthritis / Hallox Rigidus   Perimenopausal 11/18/2013   Raynaud's disease    Screening for cervical cancer 02/06/2015   Sleep apnea    wears cpap    SOB (shortness of breath)    Vitamin D deficiency     Past Surgical History:  Procedure Laterality Date   CESAREAN SECTION  2003   TONSILLECTOMY  1980     Current Outpatient Medications on File Prior to Visit  Medication Sig Dispense Refill   amLODipine (NORVASC) 10 MG tablet TAKE 1 TABLET BY MOUTH EVERY DAY 90 tablet 0   Armodafinil 250 MG tablet TAKE 1 TABLET BY MOUTH EVERY DAY 90 tablet 0   carvedilol (COREG) 6.25 MG tablet Take 1 tablet (6.25 mg total) by mouth 2 (two) times daily with a meal. 60 tablet 3   Cholecalciferol (VITAMIN D3) 50 MCG (2000 UT) capsule Take 2,000 Units by mouth daily.     Cyanocobalamin 1000 MCG/ML KIT Inject 1 Act as directed once a week. 4 kit 1   demeclocycline (DECLOMYCIN) 150 MG tablet TAKE 1 TABLET BY MOUTH TWICE A DAY (Patient taking differently: Take 150 mg by mouth daily.) 180 tablet 0   escitalopram (LEXAPRO) 20 MG tablet TAKE 1 TABLET BY MOUTH EVERY DAY 90 tablet 0   hydrALAZINE (APRESOLINE) 25 MG tablet TAKE 1 TABLET BY MOUTH THREE TIMES A DAY (Patient taking differently: Take 25 mg by mouth daily.) 270 tablet 0   MULTIPLE VITAMIN PO Take by mouth.     omeprazole (PRILOSEC) 20 MG capsule TAKE 1 CAPSULE BY MOUTH EVERY DAY 90 capsule 1   spironolactone (ALDACTONE) 25 MG tablet TAKE 1 TABLET (25 MG TOTAL) BY MOUTH DAILY. 90 tablet 0    SYRINGE-NEEDLE, DISP, 3 ML 25G X 1" 3 ML MISC Use to administer B12 injection 50 each 0   Syringe/Needle, Disp, (SYRINGE 3CC/25GX5/8") 25G X 5/8" 3 ML MISC Use to administer B12 injection weekly 50 each 0   Thiamine HCl (VITAMIN B-1) 250 MG tablet Take 250 mg by mouth daily.     torsemide (DEMADEX) 20 MG tablet Take 1 tablet (20 mg total) by mouth daily. 90 tablet 3   traZODone (DESYREL) 50 MG tablet TAKE 2 TABLETS (100 MG TOTAL) BY MOUTH AS NEEDED. 180 tablet 1   valACYclovir (VALTREX) 1000 MG tablet Take 1 tablet by mouth daily 90 tablet 0   vitamin k 100 MCG tablet Take 1 tablet by mouth daily.     No current facility-administered medications on file prior to visit.    No Known Allergies   DIAGNOSTIC DATA (LABS,  IMAGING, TESTING) - I reviewed patient records, labs, notes, testing and imaging myself where available.  Lab Results  Component Value Date   WBC 5.6 09/21/2022   HGB 10.8 (L) 09/21/2022   HCT 32.7 (L) 09/21/2022   MCV 92.4 09/21/2022   PLT 345.0 09/21/2022      Component Value Date/Time   NA 136 09/21/2022 1222   NA 139 08/29/2016 1110   K 3.5 09/21/2022 1222   CL 97 09/21/2022 1222   CO2 31 09/21/2022 1222   GLUCOSE 102 (H) 09/21/2022 1222   BUN 19 09/21/2022 1222   BUN 20 08/29/2016 1110   CREATININE 0.60 09/21/2022 1222   CREATININE 0.70 11/07/2012 1009   CALCIUM 9.2 09/21/2022 1222   PROT 7.5 03/24/2021 1504   ALBUMIN 4.5 12/08/2020 1443   ALBUMIN 4.6 08/29/2016 1110   AST 17 12/08/2020 1443   ALT 19 12/08/2020 1443   ALKPHOS 89 12/08/2020 1443   BILITOT 0.4 12/08/2020 1443   BILITOT <0.2 08/29/2016 1110   GFRNONAA >60 02/17/2020 2105   GFRAA 112 08/29/2016 1110   Lab Results  Component Value Date   CHOL 242 (H) 09/21/2022   HDL 70.70 09/21/2022   LDLCALC 152 (H) 09/21/2022   LDLDIRECT 149.0 07/30/2019   TRIG 96.0 09/21/2022   CHOLHDL 3 09/21/2022   Lab Results  Component Value Date   HGBA1C 5.9 12/08/2020   Lab Results  Component Value  Date   VITAMINB12 173 (L) 09/21/2022   Lab Results  Component Value Date   TSH 0.91 05/26/2022    PHYSICAL EXAM:  Today's Vitals   12/08/22 0930  BP: (!) 138/92  Pulse: 88  Weight: 176 lb (79.8 kg)  Height: 5\' 4"  (1.626 m)   Body mass index is 30.21 kg/m.   Wt Readings from Last 3 Encounters:  12/08/22 176 lb (79.8 kg)  12/07/22 174 lb (78.9 kg)  09/21/22 171 lb (77.6 kg)     Ht Readings from Last 3 Encounters:  12/08/22 5\' 4"  (1.626 m)  12/07/22 5\' 4"  (1.626 m)  09/21/22 5\' 4"  (1.626 m)      General: The patient is awake, alert and appears not in acute distress. The patient is groomed, cooperative, conversant.  Head: Normocephalic, atraumatic. Neck is supple. Mallampati 3,  neck circumference:16 inches . Nasal airflow not fully  patent.  Retrognathia is seen.  Dental status:  biological  Cardiovascular:  Regular rate ,  systolic murmur.  without distended neck veins. Respiratory: Lungs are clear to auscultation.  Skin:  With evidence of ankle edema. Trunk: The patient's posture is erect.   NEUROLOGIC EXAM: The patient is awake and alert, oriented to place and time.   Memory subjective described as intact.  Attention span & concentration ability appears normal.  Speech is fluent,  without  dysarthria, dysphonia or aphasia.  Mood and affect are appropriate.   Cranial nerves: no loss of smell or taste reported  Pupils are equal and briskly reactive to light. Funduscopic exam deferred. .  Extraocular movements in vertical and horizontal planes were intact and without nystagmus. No Diplopia. Visual fields by finger perimetry are intact. Hearing was intact to soft voice and finger rubbing.    Facial sensation intact to fine touch.  Facial motor strength is symmetric and tongue and uvula move midline.  There is titubation , exacerbated by eye closure.  Neck ROM : rotation, tilt and flexion extension were normal for age and shoulder shrug was symmetrical.    Motor  exam:  Symmetric bulk, tone and ROM.   Normal tone without cog -wheeling, symmetric grip strength .   Sensory:  Fine touch and vibration were tested  and  normal.  She could feel vibration at both ankles and heard the tuning fork too.  Proprioception tested in the upper extremities was normal.   Coordination: Rapid alternating movements in the fingers/hands were of normal speed.  The Finger-to-nose maneuver was abnormal, bilaterally with evidence of ataxia, and tremor. Left side has high amplitude, action and positional tremor, no resting tremor.    Gait and station: Patient could rise unassisted from a seated position, walked without assistive device.  Spontaneous gait is intact, tandem gait is impaired - cerebellar or neuropathic ? Stance is of normal width/ base and the patient turned with 3 steps.  Toe and heel walk were deferred.  Deep tendon reflexes: in the  upper and lower extremities are symmetric and intact.  Babinski response was deferred .    ASSESSMENT AND PLAN 57 y.o. year old female  here with:    1) The patient poorly compliant with CPAP, had severe OSA at baseline: ordered a new sleep study, in the meanwhile she should continue using her CPAP from 2023 with the settings of 5 through 15 cmH2O pressure and with  2 cm EPR,  New findings of LVH, valve disease. Leaking. Evaluate to rule out centra apnea.   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  Higher amplitude on the left. Titubation is clearly present, increased with anxiety.   No cogwheel rigor associated.  Her sister has tremor, cerebellar ataxia ? and is an active alcohol abuser.   3) balance problem is visible during tandem gait , she is able to walk steady with normal step size, and able to walk on tip toes, normal arm swing.she reports drifting to the left.   Physical therapy ordered.   4) She needs to see ophthalmology !.  No nystagmus, but  reported blurred vision in a family with strong history of macular degeneration   Plan: Repeat HST , lost weight 12 pounds , she is less often snoring, even without CPAP now.     I plan to follow up either personally or through our NP within 12 months.   I would like to thank Etta Grandchild, MD and Etta Grandchild, Md [address] [place],  [place] [address] for allowing me to meet with and to take care of this pleasant patient.     After spending a total time of  40  minutes face to face and additional time for physical and neurologic examination, review of laboratory studies,  personal review of imaging studies, reports and results of other testing and review of referral information / records as far as provided in visit,   Electronically signed by: Melvyn Novas, MD 12/08/2022 9:56 AM  [facility] Board certified by The [employer] of Sleep Medicine and Diplomate of the [employer] of Sleep Medicine. Board certified In Neurology through the ABPN, Fellow of the [employer] of Neurology.

## 2022-12-08 NOTE — Telephone Encounter (Signed)
[facility]: [id number] exp. 12/08/22-01/07/23 for GI

## 2022-12-08 NOTE — Patient Instructions (Addendum)
The patient poorly compliant with CPAP, had severe OSA at baseline: ordered a new sleep study, in the meanwhile she should continue using her CPAP from 2023 with the settings of 5 through 15 cmH2O pressure and with  2 cm EPR,  New findings of LVH, valve disease. Leaking. Evaluate to rule out centra apnea.   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  Higher amplitude on the left. Titubation is clearly present, increased with anxiety.   No cogwheel rigor associated.  Her sister has tremor, cerebellar ataxia ? and is an active alcohol abuser.   3) balance problem is visible during tandem gait , she is able to walk steady with normal step size, and able to walk on tip toes, normal arm swing. She reports drifting to the left.   Physical therapy ordered.   4) Dr. Yetta Barre : She needs to see ophthalmology !.  No nystagmus, but reported blurred vision in a family with strong history of macular degeneration   Plan: Repeat HST , lost weight 12 pounds , LVH, valve disorder, ankle edema. she is less often snoring, even without CPAP now.

## 2022-12-09 LAB — COMPREHENSIVE METABOLIC PANEL
ALT: 29 [IU]/L (ref 0–32)
AST: 28 [IU]/L (ref 0–40)
Albumin: 4.5 g/dL (ref 3.8–4.9)
Alkaline Phosphatase: 87 [IU]/L (ref 44–121)
BUN/Creatinine Ratio: 28 — ABNORMAL HIGH (ref 9–23)
BUN: 16 mg/dL (ref 6–24)
Bilirubin Total: <0.2 mg/dL (ref 0.0–1.2)
CO2: 28 mmol/L (ref 20–29)
Calcium: 9.4 mg/dL (ref 8.7–10.2)
Chloride: 95 mmol/L — ABNORMAL LOW (ref 96–106)
Creatinine, Ser: 0.57 mg/dL (ref 0.57–1.00)
Globulin, Total: 2.5 g/dL (ref 1.5–4.5)
Glucose: 95 mg/dL (ref 70–99)
Potassium: 4 mmol/L (ref 3.5–5.2)
Sodium: 137 mmol/L (ref 134–144)
Total Protein: 7 g/dL (ref 6.0–8.5)
eGFR: 105 mL/min/{1.73_m2} (ref >59)

## 2022-12-12 ENCOUNTER — Telehealth: Payer: Self-pay

## 2022-12-12 ENCOUNTER — Ambulatory Visit (INDEPENDENT_AMBULATORY_CARE_PROVIDER_SITE_OTHER): Payer: Medicaid Other

## 2022-12-12 DIAGNOSIS — E538 Deficiency of other specified B group vitamins: Secondary | ICD-10-CM | POA: Diagnosis not present

## 2022-12-12 MED ORDER — CYANOCOBALAMIN 1000 MCG/ML IJ SOLN
1000.0000 ug | Freq: Once | INTRAMUSCULAR | Status: AC
  Administered 2022-12-12: 1000 ug via INTRAMUSCULAR

## 2022-12-12 NOTE — Telephone Encounter (Signed)
Called pt and informed her that her CMP labs results are normal. Pt verbalized understanding. Pt had no questions at this time but was encouraged to call back if questions arise.

## 2022-12-12 NOTE — Progress Notes (Signed)
After obtaining consent, and per orders of Dr. Ronnald Ramp, injection of B12 given by Marrian Salvage. Patient instructed to report any adverse reaction to me immediately.

## 2022-12-12 NOTE — Telephone Encounter (Signed)
-----   Message from [place] sent at 12/09/2022 11:56 AM EST ----- Normal kidney function. A contrast agent can be used for MRI.

## 2022-12-13 ENCOUNTER — Telehealth: Payer: Self-pay | Admitting: Neurology

## 2022-12-13 NOTE — Telephone Encounter (Signed)
Referral for physical therapy sent to [facility]) Phone: [phone] Fax: [phone]

## 2022-12-15 ENCOUNTER — Encounter: Payer: Self-pay | Admitting: Internal Medicine

## 2022-12-15 ENCOUNTER — Encounter: Payer: Self-pay | Admitting: Cardiovascular Disease

## 2022-12-15 LAB — BRAIN NATRIURETIC PEPTIDE: BNP: 93.2 pg/mL (ref 0.0–100.0)

## 2022-12-15 LAB — BASIC METABOLIC PANEL
BUN/Creatinine Ratio: 21 (ref 9–23)
BUN: 15 mg/dL (ref 6–24)
CO2: 28 mmol/L (ref 20–29)
Calcium: 9.9 mg/dL (ref 8.7–10.2)
Chloride: 94 mmol/L — ABNORMAL LOW (ref 96–106)
Creatinine, Ser: 0.7 mg/dL (ref 0.57–1.00)
Glucose: 80 mg/dL (ref 70–99)
Potassium: 4.5 mmol/L (ref 3.5–5.2)
Sodium: 139 mmol/L (ref 134–144)
eGFR: 100 mL/min/{1.73_m2} (ref >59)

## 2022-12-19 ENCOUNTER — Ambulatory Visit (INDEPENDENT_AMBULATORY_CARE_PROVIDER_SITE_OTHER): Payer: Medicaid Other

## 2022-12-19 DIAGNOSIS — E538 Deficiency of other specified B group vitamins: Secondary | ICD-10-CM

## 2022-12-19 MED ORDER — CYANOCOBALAMIN 1000 MCG/ML IJ SOLN
1000.0000 ug | Freq: Once | INTRAMUSCULAR | Status: AC
  Administered 2022-12-19: 1000 ug via INTRAMUSCULAR

## 2022-12-19 NOTE — Progress Notes (Signed)
After obtaining consent, and per orders of Dr. Yetta Barre, injection of B12 given by Ferdie Ping. Patient instructed to any adverse reaction to me immediately.

## 2022-12-20 ENCOUNTER — Telehealth: Payer: Self-pay | Admitting: Neurology

## 2022-12-20 NOTE — Telephone Encounter (Signed)
[facility] no auth req via fax form

## 2022-12-20 NOTE — Telephone Encounter (Signed)
[facility] pending faxed notes

## 2022-12-21 ENCOUNTER — Encounter: Payer: Self-pay | Admitting: Internal Medicine

## 2022-12-21 ENCOUNTER — Ambulatory Visit: Payer: Medicaid Other | Admitting: Internal Medicine

## 2022-12-21 VITALS — BP 162/78 | HR 74 | Temp 98.1°F | Resp 16 | Ht 64.0 in | Wt 175.2 lb

## 2022-12-21 DIAGNOSIS — D539 Nutritional anemia, unspecified: Secondary | ICD-10-CM

## 2022-12-21 DIAGNOSIS — H538 Other visual disturbances: Secondary | ICD-10-CM

## 2022-12-21 DIAGNOSIS — I10 Essential (primary) hypertension: Secondary | ICD-10-CM

## 2022-12-21 DIAGNOSIS — F411 Generalized anxiety disorder: Secondary | ICD-10-CM

## 2022-12-21 DIAGNOSIS — I119 Hypertensive heart disease without heart failure: Secondary | ICD-10-CM | POA: Diagnosis not present

## 2022-12-21 DIAGNOSIS — L281 Prurigo nodularis: Secondary | ICD-10-CM | POA: Diagnosis not present

## 2022-12-21 LAB — TSH: TSH: 1.61 u[IU]/mL (ref 0.35–5.50)

## 2022-12-21 NOTE — Patient Instructions (Signed)
Hypertension, Adult High blood pressure (hypertension) is when the force of blood pumping through the arteries is too strong. The arteries are the blood vessels that carry blood from the heart throughout the body. Hypertension forces the heart to work harder to pump blood and may cause arteries to become narrow or stiff. Untreated or uncontrolled hypertension can lead to a heart attack, heart failure, a stroke, kidney disease, and other problems. A blood pressure reading consists of a higher number over a lower number. Ideally, your blood pressure should be below 120/80. The first ("top") number is called the systolic pressure. It is a measure of the pressure in your arteries as your heart beats. The second ("bottom") number is called the diastolic pressure. It is a measure of the pressure in your arteries as the heart relaxes. What are the causes? The exact cause of this condition is not known. There are some conditions that result in high blood pressure. What increases the risk? Certain factors may make you more likely to develop high blood pressure. Some of these risk factors are under your control, including: Smoking. Not getting enough exercise or physical activity. Being overweight. Having too much fat, sugar, calories, or salt (sodium) in your diet. Drinking too much alcohol. Other risk factors include: Having a personal history of heart disease, diabetes, high cholesterol, or kidney disease. Stress. Having a family history of high blood pressure and high cholesterol. Having obstructive sleep apnea. Age. The risk increases with age. What are the signs or symptoms? High blood pressure may not cause symptoms. Very high blood pressure (hypertensive crisis) may cause: Headache. Fast or irregular heartbeats (palpitations). Shortness of breath. Nosebleed. Nausea and vomiting. Vision changes. Severe chest pain, dizziness, and seizures. How is this diagnosed? This condition is diagnosed by  measuring your blood pressure while you are seated, with your arm resting on a flat surface, your legs uncrossed, and your feet flat on the floor. The cuff of the blood pressure monitor will be placed directly against the skin of your upper arm at the level of your heart. Blood pressure should be measured at least twice using the same arm. Certain conditions can cause a difference in blood pressure between your right and left arms. If you have a high blood pressure reading during one visit or you have normal blood pressure with other risk factors, you may be asked to: Return on a different day to have your blood pressure checked again. Monitor your blood pressure at home for 1 week or longer. If you are diagnosed with hypertension, you may have other blood or imaging tests to help your health care provider understand your overall risk for other conditions. How is this treated? This condition is treated by making healthy lifestyle changes, such as eating healthy foods, exercising more, and reducing your alcohol intake. You may be referred for counseling on a healthy diet and physical activity. Your health care provider may prescribe medicine if lifestyle changes are not enough to get your blood pressure under control and if: Your systolic blood pressure is above 130. Your diastolic blood pressure is above 80. Your personal target blood pressure may vary depending on your medical conditions, your age, and other factors. Follow these instructions at home: Eating and drinking  Eat a diet that is high in fiber and potassium, and low in sodium, added sugar, and fat. An example of this eating plan is called the DASH diet. DASH stands for Dietary Approaches to Stop Hypertension. To eat this way: Eat   plenty of fresh fruits and vegetables. Try to fill one half of your plate at each meal with fruits and vegetables. Eat whole grains, such as whole-wheat pasta, brown rice, or whole-grain bread. Fill about one  fourth of your plate with whole grains. Eat or drink low-fat dairy products, such as skim milk or low-fat yogurt. Avoid fatty cuts of meat, processed or cured meats, and poultry with skin. Fill about one fourth of your plate with lean proteins, such as fish, chicken without skin, beans, eggs, or tofu. Avoid pre-made and processed foods. These tend to be higher in sodium, added sugar, and fat. Reduce your daily sodium intake. Many people with hypertension should eat less than 1,500 mg of sodium a day. Do not drink alcohol if: Your health care provider tells you not to drink. You are pregnant, may be pregnant, or are planning to become pregnant. If you drink alcohol: Limit how much you have to: 0-1 drink a day for women. 0-2 drinks a day for men. Know how much alcohol is in your drink. In the [place] one drink equals one 12 oz bottle of beer (355 mL), one 5 oz glass of wine (148 mL), or one 1 oz glass of hard liquor (44 mL). Lifestyle  Work with your health care provider to maintain a healthy body weight or to lose weight. Ask what an ideal weight is for you. Get at least 30 minutes of exercise that causes your heart to beat faster (aerobic exercise) most days of the week. Activities may include walking, swimming, or biking. Include exercise to strengthen your muscles (resistance exercise), such as Pilates or lifting weights, as part of your weekly exercise routine. Try to do these types of exercises for 30 minutes at least 3 days a week. Do not use any products that contain nicotine or tobacco. These products include cigarettes, chewing tobacco, and vaping devices, such as e-cigarettes. If you need help quitting, ask your health care provider. Monitor your blood pressure at home as told by your health care provider. Keep all follow-up visits. This is important. Medicines Take over-the-counter and prescription medicines only as told by your health care provider. Follow directions carefully. Blood  pressure medicines must be taken as prescribed. Do not skip doses of blood pressure medicine. Doing this puts you at risk for problems and can make the medicine less effective. Ask your health care provider about side effects or reactions to medicines that you should watch for. Contact a health care provider if you: Think you are having a reaction to a medicine you are taking. Have headaches that keep coming back (recurring). Feel dizzy. Have swelling in your ankles. Have trouble with your vision. Get help right away if you: Develop a severe headache or confusion. Have unusual weakness or numbness. Feel faint. Have severe pain in your chest or abdomen. Vomit repeatedly. Have trouble breathing. These symptoms may be an emergency. Get help right away. Call 911. Do not wait to see if the symptoms will go away. Do not drive yourself to the hospital. Summary Hypertension is when the force of blood pumping through your arteries is too strong. If this condition is not controlled, it may put you at risk for serious complications. Your personal target blood pressure may vary depending on your medical conditions, your age, and other factors. For most people, a normal blood pressure is less than 120/80. Hypertension is treated with lifestyle changes, medicines, or a combination of both. Lifestyle changes include losing weight, eating a healthy,   low-sodium diet, exercising more, and limiting alcohol. This information is not intended to replace advice given to you by your health care provider. Make sure you discuss any questions you have with your health care provider. Document Revised: 11/24/2020 Document Reviewed: 11/24/2020 Elsevier Patient Education  2024 Elsevier Inc.  

## 2022-12-21 NOTE — Progress Notes (Signed)
Subjective:  Patient ID: Cheryl Taylor, female    DOB: May 09, 1965  Age: 57 y.o. MRN: [id number]  CC: Hypertension   HPI [name] presents for f/up ---  She has not taken an antihypertensives today because she got up too late.  Discussed the use of AI scribe software for clinical note transcription with the patient, who gave verbal consent to proceed.  History of Present Illness   The patient presents with a chief complaint of blurred, foggy vision, particularly noticeable when driving or focusing on objects directly ahead. This symptom has been present for approximately one to two months. The patient denies any associated pain or loss of vision.  In addition to the visual disturbances, the patient has been diagnosed with essential tremor, which is evident in the form of head shaking. The patient also reports difficulty with fine motor tasks, such as drawing with the left hand, and certain movements, which were discovered during a neurological examination.  The patient also mentions a history of sleep apnea and non-compliance with CPAP therapy, which was previously discussed with a cardiologist. The patient reports needing to slow down in her daily activities, suggesting a possible decrease in cognitive processing speed or physical ability.  Furthermore, the patient has been experiencing uncontrolled drooling, which occurs unexpectedly. Lastly, the patient mentions a habit of scratching her arms her horns, which she believes may be psychological in nature. An MRI has been scheduled to further investigate these neurological symptoms.       Outpatient Medications Prior to Visit  Medication Sig Dispense Refill   amLODipine (NORVASC) 10 MG tablet TAKE 1 TABLET BY MOUTH EVERY DAY 90 tablet 0   Armodafinil 250 MG tablet TAKE 1 TABLET BY MOUTH EVERY DAY 90 tablet 0   carvedilol (COREG) 6.25 MG tablet Take 1 tablet (6.25 mg total) by mouth 2 (two) times daily with a meal. 60  tablet 3   Cholecalciferol (VITAMIN D3) 50 MCG (2000 UT) capsule Take 2,000 Units by mouth daily.     Cyanocobalamin 1000 MCG/ML KIT Inject 1 Act as directed once a week. 4 kit 1   demeclocycline (DECLOMYCIN) 150 MG tablet TAKE 1 TABLET BY MOUTH TWICE A DAY (Patient taking differently: Take 150 mg by mouth daily.) 180 tablet 0   escitalopram (LEXAPRO) 20 MG tablet TAKE 1 TABLET BY MOUTH EVERY DAY 90 tablet 0   hydrALAZINE (APRESOLINE) 25 MG tablet TAKE 1 TABLET BY MOUTH THREE TIMES A DAY (Patient taking differently: Take 25 mg by mouth daily.) 270 tablet 0   MULTIPLE VITAMIN PO Take by mouth.     omeprazole (PRILOSEC) 20 MG capsule TAKE 1 CAPSULE BY MOUTH EVERY DAY 90 capsule 1   spironolactone (ALDACTONE) 25 MG tablet TAKE 1 TABLET (25 MG TOTAL) BY MOUTH DAILY. 90 tablet 0   SYRINGE-NEEDLE, DISP, 3 ML 25G X 1" 3 ML MISC Use to administer B12 injection 50 each 0   torsemide (DEMADEX) 20 MG tablet Take 1 tablet (20 mg total) by mouth daily. 90 tablet 3   traZODone (DESYREL) 50 MG tablet TAKE 2 TABLETS (100 MG TOTAL) BY MOUTH AS NEEDED. 180 tablet 1   valACYclovir (VALTREX) 1000 MG tablet Take 1 tablet by mouth daily 90 tablet 0   vitamin k 100 MCG tablet Take 1 tablet by mouth daily.     Thiamine HCl (VITAMIN B-1) 250 MG tablet Take 250 mg by mouth daily.     Syringe/Needle, Disp, (SYRINGE 3CC/25GX5/8") 25G X  5/8" 3 ML MISC Use to administer B12 injection weekly 50 each 0   No facility-administered medications prior to visit.    ROS Review of Systems  Constitutional:  Negative for appetite change, diaphoresis, fatigue and fever.  HENT: Negative.    Eyes:  Positive for visual disturbance. Negative for photophobia and redness.  Respiratory:  Negative for cough, chest tightness, shortness of breath and wheezing.   Cardiovascular:  Negative for chest pain, palpitations and leg swelling.  Gastrointestinal:  Negative for abdominal pain, constipation, diarrhea, nausea and vomiting.   Genitourinary: Negative.  Negative for difficulty urinating.  Musculoskeletal:  Positive for gait problem. Negative for back pain, myalgias and neck pain.  Skin:  Positive for wound. Negative for color change and rash.  Neurological:  Positive for tremors and numbness. Negative for dizziness, speech difficulty, weakness, light-headedness and headaches.  Hematological:  Negative for adenopathy. Does not bruise/bleed easily.  Psychiatric/Behavioral:  Positive for dysphoric mood. Negative for agitation, behavioral problems, confusion, decreased concentration, hallucinations, sleep disturbance and suicidal ideas. The patient is nervous/anxious.     Objective:  BP (!) 162/78   Pulse 74   Temp 98.1 F (36.7 C) (Temporal)   Resp 16   Ht 5\' 4"  (1.626 m)   Wt 175 lb 4 oz (79.5 kg)   LMP 11/13/2017   SpO2 96%   BMI 30.08 kg/m   BP Readings from Last 3 Encounters:  12/21/22 (!) 162/78  12/08/22 (!) 138/92  12/07/22 (!) 164/81    Wt Readings from Last 3 Encounters:  12/21/22 175 lb 4 oz (79.5 kg)  12/08/22 176 lb (79.8 kg)  12/07/22 174 lb (78.9 kg)    Physical Exam Vitals reviewed.  Constitutional:      Appearance: Normal appearance.  HENT:     Mouth/Throat:     Mouth: Mucous membranes are moist.  Eyes:     General: No scleral icterus.    Conjunctiva/sclera: Conjunctivae normal.  Cardiovascular:     Rate and Rhythm: Normal rate and regular rhythm.     Heart sounds: Murmur heard.     Systolic murmur is present with a grade of 2/6.     No friction rub. No gallop.  Pulmonary:     Effort: Pulmonary effort is normal.     Breath sounds: No stridor. No wheezing, rhonchi or rales.  Abdominal:     General: Abdomen is flat.     Palpations: There is no mass.     Tenderness: There is no abdominal tenderness. There is no guarding.     Hernia: No hernia is present.  Musculoskeletal:        General: Normal range of motion.     Cervical back: Neck supple.  Lymphadenopathy:      Cervical: No cervical adenopathy.  Skin:    General: Skin is warm and dry.     Findings: Lesion present. No rash.  Neurological:     Mental Status: She is alert. Mental status is at baseline.  Psychiatric:        Mood and Affect: Mood normal.        Behavior: Behavior normal.        Thought Content: Thought content normal.        Judgment: Judgment normal.     Lab Results  Component Value Date   WBC 5.2 12/26/2022   HGB 10.7 (L) 12/26/2022   HCT 32.3 (L) 12/26/2022   PLT 330.0 12/26/2022   GLUCOSE 80 12/14/2022   CHOL 242 (H)  09/21/2022   TRIG 96.0 09/21/2022   HDL 70.70 09/21/2022   LDLDIRECT 149.0 07/30/2019   LDLCALC 152 (H) 09/21/2022   ALT 29 12/08/2022   AST 28 12/08/2022   NA 139 12/14/2022   K 4.5 12/14/2022   CL 94 (L) 12/14/2022   CREATININE 0.70 12/14/2022   BUN 15 12/14/2022   CO2 28 12/14/2022   TSH 1.61 12/21/2022   HGBA1C 5.9 12/08/2020    US Renal Artery Stenosis  Result Date: 10/14/2021 CLINICAL DATA:  Hypertension, refractory EXAM: RENAL/URINARY TRACT ULTRASOUND RENAL DUPLEX DOPPLER ULTRASOUND COMPARISON:  None Available. FINDINGS: Right Kidney: Length: 11.4 cm. Increased echogenicity of the renal cortex. No mass or hydronephrosis visualized. Left Kidney: Length: 12.1 cm. Increased echogenicity of the renal cortex. No mass or hydronephrosis visualized. Bladder:  Within normal limits. RENAL DUPLEX ULTRASOUND Right Renal Artery Velocities: Origin:  147 cm/sec Mid:  107 cm/sec Hilum:  1 1 cm/sec Interlobar:  64 cm/sec Arcuate:  39 cm/sec, normal resistive indices ranging 0.72-0.78 Left Renal Artery Velocities: Origin:  104 cm/sec Mid:  103 cm/sec Hilum:  133 cm/sec Interlobar:  57 cm/sec Arcuate:  37 cm/sec, normal resistive indices ranging from 0.69-0.80 Aortic Velocity:  98 cm/sec Right Renal-Aortic Ratios: Origin: 1.5 Mid:  1.1 Hilum: 1.1 Interlobar: 0.7 Arcuate: 0.4 Left Renal-Aortic Ratios: Origin: 1.1 Mid: 1.1 Hilum: 1.4 Interlobar: 0.7 Arcuate: 0.4  IMPRESSION: 1. Normal vascular examination of the bilateral renal arteries. No findings of renal artery stenosis. 2. Bilateral kidneys demonstrate increased cortical echogenicity, nonspecific but suggestive of underlying medical renal disease. Electronically Signed   By: Olive Bass M.D.   On: 10/14/2021 12:01    Assessment & Plan:   Blurred vision, bilateral -     Ambulatory referral to Ophthalmology -     Urine drugs of abuse scrn w alc, routine (Ref Lab); Future  Picker's nodules -     Urine drugs of abuse scrn w alc, routine (Ref Lab); Future  Essential hypertension -     Urinalysis, Routine w reflex microscopic; Future -     Urine drugs of abuse scrn w alc, routine (Ref Lab); Future -     TSH; Future  Hypertensive left ventricular hypertrophy, without heart failure-her blood pressure is not adequately well-controlled due to noncompliance of the day of the visit. -     Urine drugs of abuse scrn w alc, routine (Ref Lab); Future -     TSH; Future  Deficiency anemia- Her anemia is not improving.  She likely has the anemia of chronic disease. -     Vitamin B1; Future -     CBC with Differential/Platelet; Future  GAD (generalized anxiety disorder) -     hydrOXYzine HCl; Take 1 tablet (10 mg total) by mouth every 8 (eight) hours as needed.  Dispense: 90 tablet; Refill: 2     Follow-up: Return in about 3 months (around 03/23/2023).  Sanda Linger, MD

## 2022-12-22 LAB — URINALYSIS, ROUTINE W REFLEX MICROSCOPIC
Bilirubin Urine: NEGATIVE
Hgb urine dipstick: NEGATIVE
Ketones, ur: NEGATIVE
Leukocytes,Ua: NEGATIVE
Nitrite: NEGATIVE
RBC / HPF: NONE SEEN (ref 0–?)
Specific Gravity, Urine: 1.01 (ref 1.000–1.030)
Total Protein, Urine: NEGATIVE
Urine Glucose: NEGATIVE
Urobilinogen, UA: 0.2 (ref 0.0–1.0)
pH: 7 (ref 5.0–8.0)

## 2022-12-22 LAB — URINE DRUGS OF ABUSE SCREEN W ALC, ROUTINE (REF LAB)
Amphetamines, Urine: NEGATIVE ng/mL
Barbiturate Quant, Ur: NEGATIVE ng/mL
Benzodiazepine Quant, Ur: NEGATIVE ng/mL
Cannabinoid Quant, Ur: NEGATIVE ng/mL
Cocaine (Metab.): NEGATIVE ng/mL
Ethanol, Urine: NEGATIVE %
Methadone Screen, Urine: NEGATIVE ng/mL
Opiate Quant, Ur: NEGATIVE ng/mL
PCP Quant, Ur: NEGATIVE ng/mL
Propoxyphene: NEGATIVE ng/mL

## 2022-12-22 MED ORDER — HYDROXYZINE HCL 10 MG PO TABS: 10.0000 mg | ORAL_TABLET | Freq: Three times a day (TID) | ORAL | 2 refills | Status: DC | PRN

## 2022-12-26 ENCOUNTER — Other Ambulatory Visit: Payer: Self-pay | Admitting: Internal Medicine

## 2022-12-26 ENCOUNTER — Encounter: Payer: Self-pay | Admitting: Internal Medicine

## 2022-12-26 ENCOUNTER — Ambulatory Visit (INDEPENDENT_AMBULATORY_CARE_PROVIDER_SITE_OTHER): Payer: Medicaid Other

## 2022-12-26 ENCOUNTER — Other Ambulatory Visit (INDEPENDENT_AMBULATORY_CARE_PROVIDER_SITE_OTHER): Payer: Medicaid Other

## 2022-12-26 ENCOUNTER — Other Ambulatory Visit: Payer: Medicaid Other

## 2022-12-26 DIAGNOSIS — E538 Deficiency of other specified B group vitamins: Secondary | ICD-10-CM

## 2022-12-26 DIAGNOSIS — D539 Nutritional anemia, unspecified: Secondary | ICD-10-CM | POA: Diagnosis not present

## 2022-12-26 LAB — CBC WITH DIFFERENTIAL/PLATELET
Basophils Absolute: 0.1 10*3/uL (ref 0.0–0.1)
Basophils Relative: 2 % (ref 0.0–3.0)
Eosinophils Absolute: 0.1 10*3/uL (ref 0.0–0.7)
Eosinophils Relative: 2.5 % (ref 0.0–5.0)
HCT: 32.3 % — ABNORMAL LOW (ref 36.0–46.0)
Hemoglobin: 10.7 g/dL — ABNORMAL LOW (ref 12.0–15.0)
Lymphocytes Relative: 22.9 % (ref 12.0–46.0)
Lymphs Abs: 1.2 10*3/uL (ref 0.7–4.0)
MCHC: 33 g/dL (ref 30.0–36.0)
MCV: 92.2 fL (ref 78.0–100.0)
Monocytes Absolute: 0.6 10*3/uL (ref 0.1–1.0)
Monocytes Relative: 11.4 % (ref 3.0–12.0)
Neutro Abs: 3.2 10*3/uL (ref 1.4–7.7)
Neutrophils Relative %: 61.2 % (ref 43.0–77.0)
Platelets: 330 10*3/uL (ref 150.0–400.0)
RBC: 3.5 Mil/uL — ABNORMAL LOW (ref 3.87–5.11)
RDW: 12.8 % (ref 11.5–15.5)
WBC: 5.2 10*3/uL (ref 4.0–10.5)

## 2022-12-26 LAB — VITAMIN B1: Vitamin B1 (Thiamine): 67 nmol/L — ABNORMAL HIGH (ref 8–30)

## 2022-12-26 MED ORDER — CYANOCOBALAMIN 1000 MCG/ML IJ SOLN
1000.0000 ug | Freq: Once | INTRAMUSCULAR | Status: AC
  Administered 2022-12-26: 1000 ug via INTRAMUSCULAR

## 2022-12-26 NOTE — Telephone Encounter (Signed)
PA was updated to be done at [facility].

## 2022-12-26 NOTE — Progress Notes (Signed)
PT visits today for their B-12 injection. PT informed of what they were receiving and tolerated injection well. PT notified to reach out of office if needed.

## 2023-01-02 ENCOUNTER — Ambulatory Visit: Payer: Medicaid Other

## 2023-01-02 ENCOUNTER — Ambulatory Visit (INDEPENDENT_AMBULATORY_CARE_PROVIDER_SITE_OTHER): Payer: Medicaid Other

## 2023-01-02 DIAGNOSIS — R270 Ataxia, unspecified: Secondary | ICD-10-CM | POA: Diagnosis not present

## 2023-01-02 DIAGNOSIS — G252 Other specified forms of tremor: Secondary | ICD-10-CM | POA: Diagnosis not present

## 2023-01-02 DIAGNOSIS — G4733 Obstructive sleep apnea (adult) (pediatric): Secondary | ICD-10-CM | POA: Diagnosis not present

## 2023-01-02 DIAGNOSIS — E538 Deficiency of other specified B group vitamins: Secondary | ICD-10-CM

## 2023-01-02 MED ORDER — GADOBUTROL 1 MMOL/ML IV SOLN
7.5000 mL | Freq: Once | INTRAVENOUS | Status: AC | PRN
  Administered 2023-01-02: 7.5 mL via INTRAVENOUS

## 2023-01-02 MED ORDER — CYANOCOBALAMIN 1000 MCG/ML IJ SOLN
1000.0000 ug | Freq: Once | INTRAMUSCULAR | Status: AC
  Administered 2023-01-02: 1000 ug via INTRAMUSCULAR

## 2023-01-02 NOTE — Progress Notes (Signed)
 PT visits today for their b-12 injection. PT informed of what they were receiving and tolerated injection well. PT notified to reach out to office if needed.

## 2023-01-05 ENCOUNTER — Other Ambulatory Visit: Payer: Medicaid Other | Admitting: Pharmacist

## 2023-01-05 NOTE — Progress Notes (Signed)
01/05/2023 Name: Cheryl Taylor MRN: [id number] DOB: 1965-12-21  Chief Complaint  Patient presents with   Hypertension   Medication Management    Cheryl Taylor is a 57 y.o. year old female who presented for a telephone visit.   They were referred to the pharmacist by their PCP for assistance in managing hypertension.    Subjective:  Care Team: Primary Care Provider: Etta Grandchild, MD ; Next Scheduled Visit: 12/21/2022   Medication Access/Adherence  Current Pharmacy:  [facility] [phone] - [place], [place] - [address] [place] [address] Phone: [phone] Fax: [phone]   Patient reports affordability concerns with their medications: No  Patient reports access/transportation concerns to their pharmacy: No  Patient reports adherence concerns with their medications:  No     Hypertension:  Current medications: Amlodipine 10 mg daily, hydralazine 25 mg TID (pt reports now taking TID), spironolactone 25 mg daily, torsemide 20 mg daily, carvedilol 6.25 mg BID Medications previously tried: carvedilol XR was denied by insurance, irbesartan (diarrhea), valsartan, chlorthalidone  Patient has a validated, automated, upper arm home BP cuff Current blood pressure readings:  11/8-11/15 132/80 150/80 156/86 132/77 165/96 149/81 145/88 Started carvedilol 11/16  *Pt has hx of hyponatremia - prescribed demeclocycline for this   Objective:  Lab Results  Component Value Date   HGBA1C 5.9 12/08/2020    Lab Results  Component Value Date   CREATININE 0.70 12/14/2022   BUN 15 12/14/2022   NA 139 12/14/2022   K 4.5 12/14/2022   CL 94 (L) 12/14/2022   CO2 28 12/14/2022    Lab Results  Component Value Date   CHOL 242 (H) 09/21/2022   HDL 70.70 09/21/2022   LDLCALC 152 (H) 09/21/2022   LDLDIRECT 149.0 07/30/2019   TRIG 96.0 09/21/2022   CHOLHDL 3 09/21/2022    Medications Reviewed Today     Reviewed by  Cheryl Taylor, RPH (Pharmacist) on 01/05/23 at 1500  Med List Status: <None>   Medication Order Taking? Sig Documenting Provider Last Dose Status Informant  amLODipine (NORVASC) 10 MG tablet [id number] Yes TAKE 1 TABLET BY MOUTH EVERY DAY Etta Grandchild, MD Taking Active   Armodafinil 250 MG tablet [id number]  TAKE 1 TABLET BY MOUTH EVERY DAY Etta Grandchild, MD  Active   carvedilol (COREG) 6.25 MG tablet [id number] Yes Take 1 tablet (6.25 mg total) by mouth 2 (two) times daily with a meal. Etta Grandchild, MD Taking Active   Cholecalciferol (VITAMIN D3) 50 MCG (2000 UT) capsule [id number] Yes Take 2,000 Units by mouth daily. [provider] Taking Active Self  Cyanocobalamin 1000 MCG/ML KIT [id number]  Inject 1 Act as directed once a week. Etta Grandchild, MD  Active   demeclocycline (DECLOMYCIN) 150 MG tablet [id number]  TAKE 1 TABLET BY MOUTH TWICE A DAY  Patient taking differently: Take 150 mg by mouth daily.   Etta Grandchild, MD  Active   escitalopram (LEXAPRO) 20 MG tablet [id number] Yes TAKE 1 TABLET BY MOUTH EVERY DAY Etta Grandchild, MD Taking Active   hydrALAZINE (APRESOLINE) 25 MG tablet [id number] Yes TAKE 1 TABLET BY MOUTH THREE TIMES A DAY  Patient taking differently: Take 25 mg by mouth daily.   Etta Grandchild, MD Taking Active   hydrOXYzine (ATARAX) 10 MG tablet [id number]  Take 1 tablet (10 mg total) by mouth every 8 (eight) hours as needed. Etta Grandchild, MD  Active  MULTIPLE VITAMIN PO [id number] Yes Take by mouth. [provider] Taking Active   omeprazole (PRILOSEC) 20 MG capsule [id number] Yes TAKE 1 CAPSULE BY MOUTH EVERY DAY Etta Grandchild, MD Taking Active   spironolactone (ALDACTONE) 25 MG tablet [id number] Yes TAKE 1 TABLET (25 MG TOTAL) BY MOUTH DAILY. Etta Grandchild, MD Taking Active   SYRINGE-NEEDLE, DISP, 3 ML 25G X 1" 3 ML MISC [id number]  Use to administer B12 injection Etta Grandchild, MD  Active   torsemide (DEMADEX) 20 MG tablet [id number] Yes  Take 1 tablet (20 mg total) by mouth daily. Croitoru, Mihai, MD Taking Active   traZODone (DESYREL) 50 MG tablet [id number]  TAKE 2 TABLETS (100 MG TOTAL) BY MOUTH AS NEEDED. Etta Grandchild, MD  Active   valACYclovir (VALTREX) 1000 MG tablet [id number]  Take 1 tablet by mouth daily Etta Grandchild, MD  Active   vitamin k 100 MCG tablet [id number] Yes Take 1 tablet by mouth daily. [provider] Taking Active               Assessment/Plan:   Hypertension: - Currently uncontrolled, BP goal <130/80. Home BP are labile. - Reviewed appropriate blood pressure monitoring technique and reviewed goal blood pressure. Recommended to check home blood pressure and heart rate daily - Continue current regimen. Recommend getting BP readings since starting carvedilol - Consider adding an ACE/ARB.      Follow Up Plan: 1/9 5 week f/u  Arbutus Leas, PharmD, BCPS Clinical Pharmacist [facility] [phone]

## 2023-01-09 ENCOUNTER — Ambulatory Visit (INDEPENDENT_AMBULATORY_CARE_PROVIDER_SITE_OTHER): Payer: Medicaid Other

## 2023-01-09 DIAGNOSIS — E538 Deficiency of other specified B group vitamins: Secondary | ICD-10-CM

## 2023-01-09 MED ORDER — CYANOCOBALAMIN 1000 MCG/ML IJ SOLN
1000.0000 ug | Freq: Once | INTRAMUSCULAR | Status: AC
  Administered 2023-01-09: 1000 ug via INTRAMUSCULAR

## 2023-01-09 NOTE — Progress Notes (Signed)
PT visits today for their b-12 injection. PT informed of what they were receiving and tolerated injection well. PT informed to reach out to office if needed.

## 2023-01-12 ENCOUNTER — Institutional Professional Consult (permissible substitution) (HOSPITAL_BASED_OUTPATIENT_CLINIC_OR_DEPARTMENT_OTHER): Payer: Medicaid Other | Admitting: Family

## 2023-01-15 ENCOUNTER — Telehealth: Payer: Medicaid Other | Admitting: Physician Assistant

## 2023-01-15 DIAGNOSIS — B9689 Other specified bacterial agents as the cause of diseases classified elsewhere: Secondary | ICD-10-CM

## 2023-01-15 DIAGNOSIS — L089 Local infection of the skin and subcutaneous tissue, unspecified: Secondary | ICD-10-CM

## 2023-01-16 ENCOUNTER — Other Ambulatory Visit: Payer: Self-pay | Admitting: Internal Medicine

## 2023-01-16 ENCOUNTER — Telehealth: Payer: Self-pay | Admitting: *Deleted

## 2023-01-16 ENCOUNTER — Ambulatory Visit (INDEPENDENT_AMBULATORY_CARE_PROVIDER_SITE_OTHER): Payer: Medicaid Other

## 2023-01-16 DIAGNOSIS — E538 Deficiency of other specified B group vitamins: Secondary | ICD-10-CM | POA: Diagnosis not present

## 2023-01-16 DIAGNOSIS — F411 Generalized anxiety disorder: Secondary | ICD-10-CM

## 2023-01-16 MED ORDER — CEPHALEXIN 500 MG PO CAPS: 500.0000 mg | ORAL_CAPSULE | Freq: Four times a day (QID) | ORAL | 0 refills | Status: DC

## 2023-01-16 MED ORDER — CYANOCOBALAMIN 1000 MCG/ML IJ SOLN
1000.0000 ug | Freq: Once | INTRAMUSCULAR | Status: AC
  Administered 2023-01-16: 1000 ug via INTRAMUSCULAR

## 2023-01-16 NOTE — Progress Notes (Signed)

## 2023-01-16 NOTE — Telephone Encounter (Signed)
-----   Message from Franklin Dohmeier sent at 01/15/2023  3:31 PM EST ----- Normal brain MRI. Tremor without parkinsonian symptoms.

## 2023-01-16 NOTE — Telephone Encounter (Signed)
LVM for pt to call back to discuss MRI results.

## 2023-01-16 NOTE — Progress Notes (Signed)
After obtaining consent, and per orders of Dr. Ronnald Ramp, injection of B12 given by Marrian Salvage. Patient instructed to report any adverse reaction to me immediately.

## 2023-01-17 ENCOUNTER — Telehealth: Payer: Self-pay

## 2023-01-17 NOTE — Telephone Encounter (Signed)
Called pt and leave a detail message, per Dr Vickey Huger "Normal brain MRI. Tremor without parkinsonian symptoms." Told patient to give [place] a call back if she has any questions.

## 2023-01-17 NOTE — Telephone Encounter (Signed)
-----   Message from Franklin Dohmeier sent at 01/15/2023  3:31 PM EST ----- Normal brain MRI. Tremor without parkinsonian symptoms.

## 2023-01-19 ENCOUNTER — Other Ambulatory Visit: Payer: Medicaid Other

## 2023-01-20 ENCOUNTER — Telehealth: Payer: Medicaid Other | Admitting: Physician Assistant

## 2023-01-20 DIAGNOSIS — L03211 Cellulitis of face: Secondary | ICD-10-CM

## 2023-01-20 NOTE — Progress Notes (Signed)
Because you have failed the 1st line treatment, we do highly recommend for you to be evaluated in person for appropriate treatment. I recommend that you be seen in a face to face visit.   NOTE: There will be NO CHARGE for this eVisit   If you are having a true medical emergency please call 911.      For an urgent face to face visit, [facility] has eight urgent care centers for your convenience:   NEW!! [facility] at [facility] Get Driving Directions [phone] [address] [place], [address]    [facility]gent [facility] Center at [facility] Get Driving Directions [phone] [address] [place], [place] [address]   [facility] Health Urgent Care Center [facility]) Get Driving Directions [phone] [address] [place], [place] [address]  [facility] Health Urgent Care Center [facility] [place]) Get Driving Directions [phone] [address] [place],  [place]  [address]  [facility] Health Urgent Care Center [facility] - at [employer]  [phone] [place],  [place] [address]   [facility] at [facility] Get Driving Directions [phone] [address] [place], [place] [address]   [facility] Health Urgent Care at [facility] Get Driving Directions  [phone] [address] [place], [place] [address]   [facility] Health Urgent Care at [facility] Directions [phone] [address] [place], [place] [address]  Your MyChart E-visit questionnaire answers were reviewed by a board certified advanced clinical practitioner to complete your personal care plan based on your specific symptoms.  Thank you for using e-Visits.     I have spent 5 minutes in review of e-visit questionnaire, review and updating patient chart, medical decision making and response to patient.   Margaretann Loveless, PA-C

## 2023-01-21 ENCOUNTER — Inpatient Hospital Stay
Admission: RE | Admit: 2023-01-21 | Discharge: 2023-01-21 | Discharge status: Home / Self Care | Payer: Medicaid Other | Source: Ambulatory Visit | Attending: Family Medicine

## 2023-01-21 VITALS — BP 138/80 | HR 60 | Temp 97.6°F | Resp 17 | Ht 64.0 in | Wt 170.0 lb

## 2023-01-21 DIAGNOSIS — L03211 Cellulitis of face: Secondary | ICD-10-CM

## 2023-01-21 DIAGNOSIS — Z5189 Encounter for other specified aftercare: Secondary | ICD-10-CM | POA: Diagnosis not present

## 2023-01-21 MED ORDER — DOXYCYCLINE HYCLATE 100 MG PO CAPS: 100.0000 mg | ORAL_CAPSULE | Freq: Two times a day (BID) | ORAL | 0 refills | Status: DC

## 2023-01-21 MED ORDER — MUPIROCIN 2 % EX OINT: 1.0000 | TOPICAL_OINTMENT | Freq: Two times a day (BID) | CUTANEOUS | 0 refills | Status: DC

## 2023-01-21 NOTE — ED Triage Notes (Signed)
Pt states that she has a wound above her eyebrow. X2 weeks

## 2023-01-21 NOTE — Discharge Instructions (Addendum)
Stop Keflex, stop demeocycline daily Take doxycycline 2 times a day.  Take this medicine with food Use the Bactroban ointment once or twice a day until skin clears See your doctor in follow-up

## 2023-01-22 NOTE — ED Provider Notes (Signed)
[facility]    CSN: [id number] Arrival date & time: 01/21/23  1450      History   Chief Complaint Chief Complaint  Patient presents with   Wound Check    Keflex not working on skin injury on face. - Entered by patient    HPI Cheryl Taylor is a 57 y.o. female.   HPI Patient states that Cheryl Taylor went to a salon to have her eyebrows waxed.  Cheryl Taylor states that Cheryl Taylor does not think that the operator was sedentary.  In any event Cheryl Taylor developed a skin infection above her left eyebrow.  Cheryl Taylor had a video visit and they prescribed Keflex for her.  After completing the Keflex Cheryl Taylor still has some redness and swelling, the skin in the area is peeling, Cheryl Taylor is also distressed that there is hair loss.  Cheryl Taylor is here for evaluation Past Medical History:  Diagnosis Date   Alcohol abuse, in remission    Anger    Anxiety    Arthritis    feet    Back pain    Chest pain    Deficiency anemia 07/31/2019   Depression    Drug use    Fatigue 02/15/2015   GERD (gastroesophageal reflux disease)    HTN (hypertension)    Hyperlipidemia    Obesity 08/10/2014   Obesity    Pain in both feet    arthritis / Hallox Rigidus   Perimenopausal 11/18/2013   Raynaud's disease    Screening for cervical cancer 02/06/2015   Sleep apnea    wears cpap    SOB (shortness of breath)    Vitamin D deficiency     Patient Active Problem List   Diagnosis Date Noted   Ataxia 12/08/2022   Hypertensive left ventricular hypertrophy, without heart failure 12/08/2022   Nonrheumatic mitral valve regurgitation 12/03/2022   Nonrheumatic tricuspid valve regurgitation 12/03/2022   Aortic valve stenosis, moderate 12/03/2022   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   Action tremor 09/21/2022   Numbness and tingling 09/21/2022   Blurred vision, bilateral 09/21/2022   Resistant hypertension 09/21/2022   Murmur, cardiac 09/21/2022   Dyslipidemia, goal LDL below 130 09/21/2022   Bradycardia on ECG  09/21/2022   Other folate deficiency anemias 04/15/2022   Severe obstructive sleep apnea 04/15/2022   Hyperaldosteronism (HCC) 08/15/2021   SIADH (syndrome of inappropriate ADH production) (HCC) 02/22/2020   Psychophysiological insomnia 02/21/2020   Iron deficiency anemia secondary to inadequate dietary iron intake 08/03/2019   Class 1 obesity due to excess calories without serious comorbidity with body mass index (BMI) of 34.0 to 34.9 in adult 02/28/2019   Hypersomnia, persistent 02/28/2019   OSA on CPAP 02/28/2019   Excessive somnolence disorder 02/05/2019   Visit for screening mammogram 02/05/2019   Primary osteoarthritis involving multiple joints 11/06/2018   Insulin resistance 05/19/2016   Cervical cancer screening 02/06/2015   Ovarian cyst, left 11/07/2012   Hepatic hemangioma 11/07/2012   Raynaud's disease 12/15/2011   Essential hypertension 06/07/2010   Vitamin D deficiency 04/20/2010    Past Surgical History:  Procedure Laterality Date   CESAREAN SECTION  2003   TONSILLECTOMY  1980    OB History     Gravida  1   Para  1   Term      Preterm      AB  0   Living  1      SAB      IAB      Ectopic  0   Multiple      Live Births           Obstetric Comments  Caesarean section          Home Medications    Prior to Admission medications   Medication Sig Start Date End Date Taking? Authorizing Provider  amLODipine (NORVASC) 10 MG tablet TAKE 1 TABLET BY MOUTH EVERY DAY 10/05/22  Yes Etta Grandchild, MD  Armodafinil 250 MG tablet TAKE 1 TABLET BY MOUTH EVERY DAY 11/16/22  Yes Etta Grandchild, MD  carvedilol (COREG) 6.25 MG tablet Take 1 tablet (6.25 mg total) by mouth 2 (two) times daily with a meal. 11/24/22  Yes Etta Grandchild, MD  cephALEXin (KEFLEX) 500 MG capsule Take 1 capsule (500 mg total) by mouth 4 (four) times daily. 01/16/23  Yes Margaretann Loveless, PA-C  Cholecalciferol (VITAMIN D3) 50 MCG (2000 UT) capsule Take 2,000 Units by mouth  daily.   Yes [provider]  Cyanocobalamin 1000 MCG/ML KIT Inject 1 Act as directed once a week. 11/24/22  Yes Etta Grandchild, MD  demeclocycline (DECLOMYCIN) 150 MG tablet TAKE 1 TABLET BY MOUTH TWICE A DAY Patient taking differently: Take 150 mg by mouth daily. 05/27/22  Yes Etta Grandchild, MD  doxycycline (VIBRAMYCIN) 100 MG capsule Take 1 capsule (100 mg total) by mouth 2 (two) times daily. 01/21/23  Yes Eustace Moore, MD  escitalopram (LEXAPRO) 20 MG tablet TAKE 1 TABLET BY MOUTH EVERY DAY 10/05/22  Yes Etta Grandchild, MD  hydrOXYzine (ATARAX) 10 MG tablet TAKE 1 TABLET BY MOUTH EVERY 8 HOURS AS NEEDED. 01/16/23  Yes Etta Grandchild, MD  MULTIPLE VITAMIN PO Take by mouth.   Yes [provider]  mupirocin ointment (BACTROBAN) 2 % Apply 1 Application topically 2 (two) times daily. 01/21/23  Yes Eustace Moore, MD  omeprazole (PRILOSEC) 20 MG capsule TAKE 1 CAPSULE BY MOUTH EVERY DAY 10/05/22  Yes Etta Grandchild, MD  spironolactone (ALDACTONE) 25 MG tablet TAKE 1 TABLET (25 MG TOTAL) BY MOUTH DAILY. 10/05/22  Yes Etta Grandchild, MD  SYRINGE-NEEDLE, DISP, 3 ML 25G X 1" 3 ML MISC Use to administer B12 injection 10/25/22  Yes Etta Grandchild, MD  torsemide (DEMADEX) 20 MG tablet Take 1 tablet (20 mg total) by mouth daily. 12/07/22  Yes Croitoru, Mihai, MD  traZODone (DESYREL) 50 MG tablet TAKE 2 TABLETS (100 MG TOTAL) BY MOUTH AS NEEDED. 10/05/22  Yes Etta Grandchild, MD  valACYclovir (VALTREX) 1000 MG tablet Take 1 tablet by mouth daily 05/24/21  Yes Etta Grandchild, MD  vitamin k 100 MCG tablet Take 1 tablet by mouth daily.   Yes [provider]    Family History Family History  Problem Relation Age of Onset   Kidney failure Father 35       deceased   Kidney disease Father    Obesity Father    Seizures Father    Heart disease Father 68   Hypertension Father    Hyperlipidemia Father    Stroke Father    Sleep apnea Father    Schizophrenia Brother         52yo   Alcohol abuse Brother    Other Brother        chronic pain, 30 yo   Stroke Brother    Alcohol abuse Sister        45yo   Obesity Paternal Grandfather    Stroke Sister  Depression Mother    Anxiety disorder Mother    Obesity Mother    Colon cancer Neg Hx    Colon polyps Neg Hx    Esophageal cancer Neg Hx    Rectal cancer Neg Hx    Stomach cancer Neg Hx     Social History Social History   Tobacco Use   Smoking status: Never   Smokeless tobacco: Never  Vaping Use   Vaping status: Never Used  Substance Use Topics   Alcohol use: No    Alcohol/week: 50.0 standard drinks of alcohol    Types: 50 Glasses of wine per week    Comment: quit ETOH on 2013   Drug use: Yes    Types: Amphetamines    Comment: Ativan 4-5mg  daily, no longer taking     Allergies   Patient has no known allergies.   Review of Systems Review of Systems See HPI  Physical Exam Triage Vital Signs ED Triage Vitals  Encounter Vitals Group     BP 01/21/23 1535 138/80     Systolic BP Percentile --      Diastolic BP Percentile --      Pulse Rate 01/21/23 1535 60     Resp 01/21/23 1535 17     Temp 01/21/23 1535 97.6 F (36.4 C)     Temp Source 01/21/23 1535 Oral     SpO2 01/21/23 1535 99 %     Weight 01/21/23 1532 170 lb (77.1 kg)     Height 01/21/23 1532 5\' 4"  (1.626 m)     Head Circumference --      Peak Flow --      Pain Score 01/21/23 1532 0     Pain Loc --      Pain Education --      Exclude from Growth Chart --    No data found.  Updated Vital Signs BP 138/80 (BP Location: Left Arm)   Pulse 60   Temp 97.6 F (36.4 C) (Oral)   Resp 17   Ht 5\' 4"  (1.626 m)   Wt 77.1 kg   LMP 11/13/2017   SpO2 99%   BMI 29.18 kg/m      Physical Exam Constitutional:      General: Cheryl Taylor is not in acute distress.    Appearance: Cheryl Taylor is well-developed.  HENT:     Head: Normocephalic and atraumatic.      Comments: Small area of erythema of the skin.  Soft tissue swelling.  Mildly tender.   There has been some scaling in the area.  The Berry medial portion of the left brow has hair loss Eyes:     Conjunctiva/sclera: Conjunctivae normal.     Pupils: Pupils are equal, round, and reactive to light.  Cardiovascular:     Rate and Rhythm: Normal rate.  Pulmonary:     Effort: Pulmonary effort is normal. No respiratory distress.  Abdominal:     General: There is no distension.     Palpations: Abdomen is soft.  Musculoskeletal:        General: Normal range of motion.     Cervical back: Normal range of motion.  Skin:    General: Skin is warm and dry.  Neurological:     Mental Status: Cheryl Taylor is alert.      UC Treatments / Results  Labs (all labs ordered are listed, but only abnormal results are displayed) Labs Reviewed - No data to display  EKG   Radiology No results found.  Procedures  Procedures (including critical care time)  Medications Ordered in UC Medications - No data to display  Initial Impression / Assessment and Plan / UC Course  I have reviewed the triage vital signs and the nursing notes.  Pertinent labs & imaging results that were available during my care of the patient were reviewed by me and considered in my medical decision making (see chart for details).     Concern for staph infection.  Incomplete response to Keflex. Final Clinical Impressions(s) / UC Diagnoses   Final diagnoses:  Visit for wound check  Cellulitis of face     Discharge Instructions      Stop Keflex, stop demeocycline daily Take doxycycline 2 times a day.  Take this medicine with food Use the Bactroban ointment once or twice a day until skin clears See your doctor in follow-up    ED Prescriptions     Medication Sig Dispense Auth. Provider   doxycycline (VIBRAMYCIN) 100 MG capsule Take 1 capsule (100 mg total) by mouth 2 (two) times daily. 14 capsule Eustace Moore, MD   mupirocin ointment (BACTROBAN) 2 % Apply 1 Application topically 2 (two) times daily. 22 g  Eustace Moore, MD      PDMP not reviewed this encounter.   Eustace Moore, MD 01/22/23 [phone]

## 2023-01-23 ENCOUNTER — Ambulatory Visit (INDEPENDENT_AMBULATORY_CARE_PROVIDER_SITE_OTHER): Payer: Medicaid Other

## 2023-01-23 ENCOUNTER — Telehealth: Payer: Self-pay

## 2023-01-23 DIAGNOSIS — E538 Deficiency of other specified B group vitamins: Secondary | ICD-10-CM

## 2023-01-23 MED ORDER — CYANOCOBALAMIN 1000 MCG/ML IJ SOLN
1000.0000 ug | Freq: Once | INTRAMUSCULAR | Status: AC
  Administered 2023-01-23: 1000 ug via INTRAMUSCULAR

## 2023-01-23 NOTE — Progress Notes (Signed)
After obtaining consent, and per orders of Dr. Ronnald Ramp, injection of B12 given by Marrian Salvage. Patient instructed to report any adverse reaction to me immediately.

## 2023-01-30 ENCOUNTER — Telehealth: Payer: Self-pay

## 2023-01-30 ENCOUNTER — Ambulatory Visit (INDEPENDENT_AMBULATORY_CARE_PROVIDER_SITE_OTHER): Payer: Medicaid Other

## 2023-01-30 DIAGNOSIS — E538 Deficiency of other specified B group vitamins: Secondary | ICD-10-CM

## 2023-01-30 MED ORDER — CYANOCOBALAMIN 1000 MCG/ML IJ SOLN
1000.0000 ug | Freq: Once | INTRAMUSCULAR | Status: AC
  Administered 2023-01-30: 1000 ug via INTRAMUSCULAR

## 2023-01-30 NOTE — Progress Notes (Signed)
Patient here for monthly B12 injection per Dr. Jones.  B12 1000 mcg given in right IM and patient tolerated injection well today.  

## 2023-01-30 NOTE — Telephone Encounter (Signed)
Pt called requesting rx for Latisse. Call was made also on 12/26. Per Dr Delton See, this is not a med that can be sent through Urgent Care setting. Must f/u with PCP to get rx and f/u. Pt verbalized understanding.

## 2023-02-04 ENCOUNTER — Other Ambulatory Visit: Payer: Self-pay | Admitting: Internal Medicine

## 2023-02-04 DIAGNOSIS — I10 Essential (primary) hypertension: Secondary | ICD-10-CM

## 2023-02-06 ENCOUNTER — Ambulatory Visit (INDEPENDENT_AMBULATORY_CARE_PROVIDER_SITE_OTHER): Payer: Medicaid Other

## 2023-02-06 DIAGNOSIS — E538 Deficiency of other specified B group vitamins: Secondary | ICD-10-CM

## 2023-02-06 MED ORDER — CYANOCOBALAMIN 1000 MCG/ML IJ SOLN
1000.0000 ug | Freq: Once | INTRAMUSCULAR | Status: AC
  Administered 2023-02-06: 1000 ug via INTRAMUSCULAR

## 2023-02-06 NOTE — Progress Notes (Signed)
 Patient visits today for their b-12 injection. Patient informed of what they had received and tolerated injection well. Patient notified to reach out to the office if needed.

## 2023-02-07 ENCOUNTER — Other Ambulatory Visit: Payer: Self-pay | Admitting: Internal Medicine

## 2023-02-07 DIAGNOSIS — F411 Generalized anxiety disorder: Secondary | ICD-10-CM

## 2023-02-09 ENCOUNTER — Other Ambulatory Visit (INDEPENDENT_AMBULATORY_CARE_PROVIDER_SITE_OTHER): Payer: Medicaid Other | Admitting: Pharmacist

## 2023-02-09 VITALS — BP 128/81

## 2023-02-09 DIAGNOSIS — I10 Essential (primary) hypertension: Secondary | ICD-10-CM

## 2023-02-09 MED ORDER — LOSARTAN POTASSIUM 25 MG PO TABS: 25.0000 mg | ORAL_TABLET | Freq: Every morning | ORAL | 0 refills | Status: DC

## 2023-02-09 NOTE — Progress Notes (Signed)
 02/09/2023 Name: Cheryl Taylor MRN: [id number] DOB: 30-Oct-1965  Chief Complaint  Patient presents with   Hypertension   Medication Management     Cheryl Taylor is a 58 y.o. year old female who presented for a telephone visit.   They were referred to the pharmacist by their PCP for assistance in managing hypertension.    Subjective:  Care Team: Primary Care Provider: Joshua Debby CROME, MD ; Next Scheduled Visit: 12/21/2022   Medication Access/Adherence  Current Pharmacy:  [facility] [phone] - [place], [place] - [address] [place] [address] Phone: [phone] Fax: [phone]   Patient reports affordability concerns with their medications: No  Patient reports access/transportation concerns to their pharmacy: No  Patient reports adherence concerns with their medications:  No     Hypertension:  Current medications: Amlodipine  10 mg daily, hydralazine  25 mg TID (pt reports now taking TID), spironolactone  25 mg daily, torsemide  20 mg daily, carvedilol  6.25 mg BID Medications previously tried: carvedilol  XR was denied by insurance, irbesartan  (diarrhea), valsartan , chlorthalidone   Patient has a validated, automated, upper arm home BP cuff Current blood pressure readings:  12/5 144/73 (70) 12/9 140/80 (68) 12/13 138/67 (77) 12/15 129/76 (66) 12/21 144/77 (62) 12/22 145/78 (68) 12/26 139/78 (64) 12/30 145/86 (71) 12/31 127/78 (69) 1/1 165/92 (61) 1/2 147/83 (63) 1/4 137/79 (66) 1/9 128/81 (68)  *Pt has hx of hyponatremia - prescribed demeclocycline for this   Objective:  Lab Results  Component Value Date   HGBA1C 5.9 12/08/2020    Lab Results  Component Value Date   CREATININE 0.70 12/14/2022   BUN 15 12/14/2022   NA 139 12/14/2022   K 4.5 12/14/2022   CL 94 (L) 12/14/2022   CO2 28 12/14/2022    Lab Results  Component Value Date   CHOL 242 (H) 09/21/2022   HDL 70.70 09/21/2022   LDLCALC 152 (H)  09/21/2022   LDLDIRECT 149.0 07/30/2019   TRIG 96.0 09/21/2022   CHOLHDL 3 09/21/2022    Medications Reviewed Today     Reviewed by Merceda Lela SAUNDERS, RPH (Pharmacist) on 02/09/23 at 1605  Med List Status: <None>   Medication Order Taking? Sig Documenting Provider Last Dose Status Informant  amLODipine  (NORVASC ) 10 MG tablet [id number] Yes TAKE 1 TABLET BY MOUTH EVERY DAY Joshua Debby CROME, MD Taking Active   Armodafinil  250 MG tablet [id number]  TAKE 1 TABLET BY MOUTH EVERY DAY Joshua Debby CROME, MD  Active   carvedilol  (COREG ) 6.25 MG tablet [id number] Yes Take 1 tablet (6.25 mg total) by mouth 2 (two) times daily with a meal. Joshua Debby CROME, MD Taking Active   cephALEXin  (KEFLEX ) 500 MG capsule [id number]  Take 1 capsule (500 mg total) by mouth 4 (four) times daily. Burnette, Jerie M, PA-C  Active   Cholecalciferol (VITAMIN D3) 50 MCG (2000 UT) capsule [id number]  Take 2,000 Units by mouth daily. [provider]  Active Self  Cyanocobalamin  1000 MCG/ML KIT [id number]  Inject 1 Act as directed once a week. Joshua Debby CROME, MD  Active   escitalopram  (LEXAPRO ) 20 MG tablet [id number]  TAKE 1 TABLET BY MOUTH EVERY DAY Joshua Debby CROME, MD  Active   hydrOXYzine  (ATARAX ) 10 MG tablet [id number]  TAKE 1 TABLET BY MOUTH EVERY 8 HOURS AS NEEDED. Joshua Debby CROME, MD  Active   losartan  (COZAAR ) 25 MG tablet [id number]  Take 1 tablet (25 mg total) by mouth every morning. Joshua,  Debby CROME, MD  Active   MULTIPLE VITAMIN PO [id number]  Take by mouth. [provider]  Active   mupirocin  ointment (BACTROBAN ) 2 % [id number]  Apply 1 Application topically 2 (two) times daily. Maranda Jamee Jacob, MD  Active   omeprazole  [facility]) 20 MG capsule [id number]  TAKE 1 CAPSULE BY MOUTH EVERY DAY Joshua Debby CROME, MD  Active   spironolactone  (ALDACTONE ) 25 MG tablet [id number] Yes TAKE 1 TABLET (25 MG TOTAL) BY MOUTH DAILY. Joshua Debby CROME, MD Taking Active   SYRINGE-NEEDLE, DISP, 3 ML 25G X 1 3 ML MISC  [id number]  Use to administer B12 injection Joshua Debby CROME, MD  Active   torsemide  (DEMADEX ) 20 MG tablet [id number] Yes Take 1 tablet (20 mg total) by mouth daily. Croitoru, Mihai, MD Taking Active   traZODone  (DESYREL ) 50 MG tablet [id number]  TAKE 2 TABLETS (100 MG TOTAL) BY MOUTH AS NEEDED. Joshua Debby CROME, MD  Active   valACYclovir  (VALTREX ) 1000 MG tablet [id number]  Take 1 tablet by mouth daily Joshua Debby CROME, MD  Active   vitamin k 100 MCG tablet [id number]  Take 1 tablet by mouth daily. [provider]  Active               Assessment/Plan:   Hypertension: - Currently uncontrolled, BP goal <130/80. Home BP are labile. - Reviewed appropriate blood pressure monitoring technique and reviewed goal blood pressure. Recommended to check home blood pressure and heart rate daily -  Recommend adding losartan  25 mg daily for hypertension and for HFpEF (dx by cardio in November) - She has cardio f/u on 1/30 - recommend getting BMP checked at that time to f/u on Scr and K since starting losartan      Follow Up Plan: F/u after 1/30 appt  Darrelyn Drum, PharmD, BCPS, CPP Clinical Pharmacist Practitioner [facility] [phone]

## 2023-02-09 NOTE — Patient Instructions (Addendum)
 It was a pleasure speaking with you today!  Start losartan  25 mg every morning. Continue monitoring blood pressures at home.  Feel free to call with any questions or concerns!  Darrelyn Drum, PharmD, BCPS [facility] Clinical Pharmacist [facility] [phone]

## 2023-02-10 ENCOUNTER — Other Ambulatory Visit: Payer: Self-pay | Admitting: Internal Medicine

## 2023-02-10 DIAGNOSIS — E538 Deficiency of other specified B group vitamins: Secondary | ICD-10-CM

## 2023-02-13 ENCOUNTER — Other Ambulatory Visit: Payer: Self-pay | Admitting: Internal Medicine

## 2023-02-13 ENCOUNTER — Ambulatory Visit: Payer: Medicaid Other

## 2023-02-13 DIAGNOSIS — I10 Essential (primary) hypertension: Secondary | ICD-10-CM

## 2023-02-15 ENCOUNTER — Ambulatory Visit: Payer: Medicaid Other

## 2023-02-20 ENCOUNTER — Ambulatory Visit: Payer: Medicaid Other

## 2023-02-22 ENCOUNTER — Ambulatory Visit (INDEPENDENT_AMBULATORY_CARE_PROVIDER_SITE_OTHER): Payer: Medicaid Other

## 2023-02-22 DIAGNOSIS — E538 Deficiency of other specified B group vitamins: Secondary | ICD-10-CM

## 2023-02-22 MED ORDER — CYANOCOBALAMIN 1000 MCG/ML IJ SOLN
1000.0000 ug | Freq: Once | INTRAMUSCULAR | Status: AC
  Administered 2023-02-22: 1000 ug via INTRAMUSCULAR

## 2023-02-22 NOTE — Progress Notes (Signed)
Patient visits today to receive her B12 injection. Patient was informed and tolerated well. Patient was notified to reach out to [place] if needed.

## 2023-02-24 ENCOUNTER — Encounter (HOSPITAL_BASED_OUTPATIENT_CLINIC_OR_DEPARTMENT_OTHER): Payer: Self-pay

## 2023-02-27 ENCOUNTER — Ambulatory Visit: Payer: Medicaid Other

## 2023-02-27 ENCOUNTER — Telehealth: Payer: Self-pay | Admitting: Neurology

## 2023-02-27 NOTE — Telephone Encounter (Signed)
HST- [facility] [employer] no auth req via fax form   Patient is scheduled at [facility] for 03/21/23 at 3 pm.  Mailed packet to the patient.

## 2023-03-01 ENCOUNTER — Ambulatory Visit: Payer: Medicaid Other

## 2023-03-01 DIAGNOSIS — E538 Deficiency of other specified B group vitamins: Secondary | ICD-10-CM

## 2023-03-01 MED ORDER — CYANOCOBALAMIN 1000 MCG/ML IJ SOLN
1000.0000 ug | Freq: Once | INTRAMUSCULAR | Status: AC
  Administered 2023-03-01: 1000 ug via INTRAMUSCULAR

## 2023-03-01 NOTE — Progress Notes (Addendum)
Pt was given B12 injec w/o any complications at this time.  Medical screening examination/treatment/procedure(s) were performed by non-physician practitioner and as supervising physician I was immediately available for consultation/collaboration.  I agree with above. Jacinta Shoe, MD

## 2023-03-02 ENCOUNTER — Ambulatory Visit (INDEPENDENT_AMBULATORY_CARE_PROVIDER_SITE_OTHER): Payer: Medicaid Other | Admitting: Family

## 2023-03-02 ENCOUNTER — Encounter (HOSPITAL_BASED_OUTPATIENT_CLINIC_OR_DEPARTMENT_OTHER): Payer: Self-pay | Admitting: Family

## 2023-03-02 VITALS — BP 136/73 | HR 76 | Ht 64.0 in | Wt 184.4 lb

## 2023-03-02 DIAGNOSIS — I5032 Chronic diastolic (congestive) heart failure: Secondary | ICD-10-CM

## 2023-03-02 DIAGNOSIS — I2721 Secondary pulmonary arterial hypertension: Secondary | ICD-10-CM

## 2023-03-02 DIAGNOSIS — I10 Essential (primary) hypertension: Secondary | ICD-10-CM

## 2023-03-02 DIAGNOSIS — G4733 Obstructive sleep apnea (adult) (pediatric): Secondary | ICD-10-CM

## 2023-03-02 DIAGNOSIS — E782 Mixed hyperlipidemia: Secondary | ICD-10-CM | POA: Diagnosis not present

## 2023-03-02 DIAGNOSIS — I340 Nonrheumatic mitral (valve) insufficiency: Secondary | ICD-10-CM | POA: Diagnosis not present

## 2023-03-02 NOTE — Patient Instructions (Signed)
Medication Instructions:   Continue your current medications  *If you need a refill on your cardiac medications before your next appointment, please call your pharmacy*  Lab Work: Your physician recommends that you return for lab work today: BMP, plasma metanephrines  Plasma metanephrines evaluates for pheochromocytoma which could contribute to high blood pressure.   If you have labs (blood work) drawn today and your tests are completely normal, you will receive your results only by: MyChart Message (if you have MyChart) OR A paper copy in the mail If you have any lab test that is abnormal or we need to change your treatment, we will call you to review the results.  Follow-Up: At [facility], you and your health needs are our priority.  As part of our continuing mission to provide you with exceptional heart care, we have created designated Provider Care Teams.  These Care Teams include your primary Cardiologist (physician) and Advanced Practice Providers (APPs -  Physician Assistants and Nurse Practitioners) who all work together to provide you with the care you need, when you need it.  We recommend signing up for the patient portal called "MyChart".  Sign up information is provided on this After Visit Summary.  MyChart is used to connect with patients for Virtual Visits (Telemedicine).  Patients are able to view lab/test results, encounter notes, upcoming appointments, etc.  Non-urgent messages can be sent to your provider as well.   To learn more about what you can do with MyChart, go to [email or web address].    Your next appointment:   In 3 months with Hypertension  Clinic Dr. Duke Salvia, Alver Sorrow, NP, or Phillips Hay, [facility]  Other Instructions  Our goal is your resting heart rate to be 55bpm - 99bpm  Our goal is for your blood pressure to be less than 130/80.

## 2023-03-02 NOTE — Progress Notes (Signed)
[facility] Initial Assessment:    Date:  03/02/2023   ID:  Cheryl Taylor, DOB [birth year], MRN [id number]  PCP:  Etta Grandchild, MD  Cardiologist:  Thurmon Fair, MD  Nephrologist:  Referring MD: Etta Grandchild, MD   CC: Hypertension  History of Present Illness:    Cheryl Taylor is a 58 y.o. female with a hx of diastolic heart failure, OSA (AHI 39/hr), HTN, HLD, cardiac murmur, chronic mild normocytic anemia, mild PAH, mitral regurgitation, remote alcohol abuse currently in remission here to establish care in the [facility].   Pounds with hyponatremia 2021 prescribed  demeclocycline.  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  Prior renin aldosterone levels normal 2022 and 2023.  No renal artery stenosis by duplex 2023.  Echo 11/2022 normal LVEF, moderate LVH, indeterminate diastolic function, mild to moderate MR, moderate TR, mild aortic stenosis (mean gradient 11 mmHg).  However based on Dr. Erin Hearing review gradients across the valve slightly increased but no evidence of systolic anterior motion of the mitral valve flow gradients were increased due to high cardiac output.  12/08/22 visit with Dr. Meryle Ready poor adherence to CPAP. New sleep study ordered and recommended to  continue using CPAP from 2023. Sleep study scheduled 03/21/23. She was seen by PharmD at her PCP team 02/09/23 and started on Losartan 25mg  daily.   Cheryl Taylor was diagnosed with hypertension more than 20 years ago. It has been difficult to control. Blood pressure checked with arm cuff at home. Since adding Losartan, home BP readings have been 142/81, 139/82, 146/88, 139/90, 138/84, 143/83, 130/77, 126/80, 126/68, 133/76, 138/78, 125/77. she reports tobacco use never. Alcohol use previously having quit in 2013. For exercise she she has no formal routine. Exercise limited by neurology issues and feeling of imbalance. she eats at home and  does follow low sodium diet. Notes her husband makes very healthy meals. Drinks one cup of coffee in the morning then water.   Notes her present CPAP is broken. She has a portable one but is only using intermittently. She is excited to get her regular CPAP hopefully replaced after her upcoming sleep study.   BP via her home cuff after sitting a few minutes: 130/83  Medication schedule: AM - Torsemide, Carvedilol PM - Amlodipine, Losartan,  Spironolactone, Carvedilol  Previous antihypertensives: Amlodipine - swelling Irbesartan (diarrhea) Valsartan Chlorthalidone Chlorthalidone Carvedilol XR - denied by insurance   Past Medical History:  Diagnosis Date   Alcohol abuse, in remission    Anger    Anxiety    Arthritis    feet    Back pain    Chest pain    Deficiency anemia 07/31/2019   Depression    Drug use    Fatigue 02/15/2015   GERD (gastroesophageal reflux disease)    HTN (hypertension)    Hyperlipidemia    Obesity 08/10/2014   Obesity    Pain in both feet    arthritis / Hallox Rigidus   Perimenopausal 11/18/2013   Raynaud's disease    Screening for cervical cancer 02/06/2015   Sleep apnea    wears cpap    SOB (shortness of breath)    Vitamin D deficiency     Past Surgical History:  Procedure Laterality Date   CESAREAN SECTION  2003   TONSILLECTOMY  1980    Current Medications: Current Meds  Medication Sig   amLODipine (NORVASC) 10 MG tablet TAKE 1  TABLET BY MOUTH EVERY DAY   Armodafinil 250 MG tablet TAKE 1 TABLET BY MOUTH EVERY DAY   carvedilol (COREG) 6.25 MG tablet TAKE 1 TABLET BY MOUTH 2 TIMES DAILY WITH A MEAL.   Cholecalciferol (VITAMIN D3) 50 MCG (2000 UT) capsule Take 2,000 Units by mouth daily.   escitalopram (LEXAPRO) 20 MG tablet TAKE 1 TABLET BY MOUTH EVERY DAY   hydrOXYzine (ATARAX) 10 MG tablet TAKE 1 TABLET BY MOUTH EVERY 8 HOURS AS NEEDED.   losartan (COZAAR) 25 MG tablet Take 1 tablet (25 mg total) by mouth every morning.   MULTIPLE  VITAMIN PO Take by mouth.   omeprazole (PRILOSEC) 20 MG capsule TAKE 1 CAPSULE BY MOUTH EVERY DAY   spironolactone (ALDACTONE) 25 MG tablet TAKE 1 TABLET (25 MG TOTAL) BY MOUTH DAILY.   torsemide (DEMADEX) 20 MG tablet Take 1 tablet (20 mg total) by mouth daily.   traZODone (DESYREL) 50 MG tablet TAKE 2 TABLETS (100 MG TOTAL) BY MOUTH AS NEEDED.   valACYclovir (VALTREX) 1000 MG tablet Take 1 tablet by mouth daily   vitamin k 100 MCG tablet Take 1 tablet by mouth daily.     Allergies:   Patient has no known allergies.   Social History   Socioeconomic History   Marital status: Married    Spouse name: Joe   Number of children: 1   Years of education: Not on file   Highest education level: Bachelor's degree (e.g., BA, AB, BS)  Occupational History   Occupation: Best boy     Comment: [employer]  Tobacco Use   Smoking status: Never   Smokeless tobacco: Never  Vaping Use   Vaping status: Never Used  Substance and Sexual Activity   Alcohol use: No    Alcohol/week: 50.0 standard drinks of alcohol    Types: 50 Glasses of wine per week    Comment: quit ETOH on 2013   Drug use: Yes    Types: Amphetamines    Comment: Ativan 4-5mg  daily, no longer taking   Sexual activity: Yes    Partners: Male    Birth control/protection: Pill    Comment: lives with husband and daughter, works at [employer], no dietary restrictions  Other Topics Concern   Not on file  Social History Narrative   Lives wih husband and daughter   Right handed   Caffeine: 1-2 cups a day   Social Drivers of Health   Financial Resource Strain: Low Risk  (12/18/2022)   Overall Financial Resource Strain (CARDIA)    Difficulty of Paying Living Expenses: Not hard at all  Food Insecurity: No Food Insecurity (03/02/2023)   Hunger Vital Sign    Worried About Running Out of Food in the Last Year: Never true    Ran Out of Food in the Last Year: Never true  Transportation Needs: No Transportation Needs  (03/02/2023)   PRAPARE - Administrator, Civil Service (Medical): No    Lack of Transportation (Non-Medical): No  Physical Activity: Unknown (12/18/2022)   Exercise Vital Sign    Days of Exercise per Week: 0 days    Minutes of Exercise per Session: Not on file  Stress: Stress Concern Present (12/18/2022)   [employer] of Occupational Health - Occupational Stress Questionnaire    Feeling of Stress : To some extent  Social Connections: Moderately Integrated (12/18/2022)   Social Connection and Isolation Panel [NHANES]    Frequency of Communication with Friends and Family: More than three times  a week    Frequency of Social Gatherings with Friends and Family: Once a week    Attends Religious Services: Never    Database administrator or Organizations: Yes    Attends Engineer, structural: More than 4 times per year    Marital Status: Married     Family History: The patient's family history includes Alcohol abuse in her brother and sister; Anxiety disorder in her mother; Depression in her mother; Heart disease (age of onset: 60) in her father; Hyperlipidemia in her father; Hypertension in her father; Kidney disease in her father; Kidney failure (age of onset: 61) in her father; Obesity in her father, mother, and paternal grandfather; Other in her brother; Schizophrenia in her brother; Seizures in her father; Sleep apnea in her father; Stroke in her brother, father, and sister. There is no history of Colon cancer, Colon polyps, Esophageal cancer, Rectal cancer, or Stomach cancer.  ROS:   Please see the history of present illness.     All other systems reviewed and are negative.  EKGs/Labs/Other Studies Reviewed:         Recent Labs: 12/08/2022: ALT 29 12/14/2022: BNP 93.2; BUN 15; Creatinine, Ser 0.70; Potassium 4.5; Sodium 139 12/21/2022: TSH 1.61 12/26/2022: Hemoglobin 10.7; Platelets 330.0   Recent Lipid Panel    Component Value Date/Time   CHOL 242 (H)  09/21/2022 1222   CHOL 190 08/29/2016 1110   TRIG 96.0 09/21/2022 1222   HDL 70.70 09/21/2022 1222   HDL 47 08/29/2016 1110   CHOLHDL 3 09/21/2022 1222   VLDL 19.2 09/21/2022 1222   LDLCALC 152 (H) 09/21/2022 1222   LDLCALC 123 (H) 08/29/2016 1110   LDLDIRECT 149.0 07/30/2019 1627    Physical Exam:   VS:  BP 136/73 (BP Location: Right Arm, Patient Position: Sitting, Cuff Size: Large)   Pulse 76   Ht 5\' 4"  (1.626 m)   Wt 184 lb 6.4 oz (83.6 kg)   LMP 11/13/2017   SpO2 97%   BMI 31.65 kg/m  , BMI Body mass index is 31.65 kg/m. GENERAL:  Well appearing HEENT: Pupils equal round and reactive, fundi not visualized, oral mucosa unremarkable NECK:  No jugular venous distention, waveform within normal limits, carotid upstroke brisk and symmetric, no bruits, no thyromegaly LYMPHATICS:  No cervical adenopathy LUNGS:  Clear to auscultation bilaterally HEART:  RRR.  PMI not displaced or sustained,S1 and S2 within normal limits, no S3, no S4, no clicks, no rubs, no murmurs ABD:  Flat, positive bowel sounds normal in frequency in pitch, no bruits, no rebound, no guarding, no midline pulsatile mass, no hepatomegaly, no splenomegaly EXT:  2 plus pulses throughout, no edema, no cyanosis no clubbing SKIN:  No rashes no nodules NEURO:  Cranial nerves II through XII grossly intact, motor grossly intact throughout PSYCH:  Cognitively intact, oriented to person place and time   ASSESSMENT/PLAN:    HTN -BP reasonably controlled in clinic and home BP on average over the last week 131/77.  Home BP cuff found to be accurate.  Continue present regimen amlodipine 10 mg daily, carvedilol 625 mg twice daily, losartan 25 mg daily, spironolactone 25 mg daily, torsemide 20 mg daily.  Check in via check in 1 week to reassess BP as we spent a portion of our time today reviewing how to properly check BP at home. She is interested in combination tablets in future, if possible.  Update BMP after primary care  pharmacy team added losartan. Assess plasma metanephrines to  rule out pheochromocytoma.  OSA-pending repeat sleep study 03/21/2023.  Reports her regular CPAP machine is not working and she is having to use her portable one which she does not tolerate well as it is drying.  Will reach out to Dr. Vickey Huger to see if there is alternative while she awaits sleep study.  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  HLD-09/21/2022 total cholesterol 242, triglycerides 96, HDL 70, LDL 152. The 10-year ASCVD risk score (Arnett DK, et al., 2019) is: 4.9%. Recommend lowering lipids through diet and exercise. Encouraged to consider participation in Right Start exercise program.   Diastolic heart failure / Valvular abnormality (AS/MR) / PAH -Per primary cardiology team.  No evidence of volume overload.  Reports swelling has been improved from prior on torsemide 20 mg daily, spironolactone 25 mg daily.  Hx of hyponatremia - In 2021 previously requiring demeclocycline.  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  Careful monitoring, BMP today.   Screening for Secondary Hypertension:     03/02/2023    1:43 PM  Causes  Renovascular HTN Screened     - Comments Renal artery duplex no stenosis  Sleep Apnea Screened     - Comments Wearing CPAP inconsistently.  Repeat sleep today upcoming  Thyroid Disease Screened     - Comments Unremarkable TSH 12/2022  Hyperaldosteronism Screened     - Comments Unremarkable renin aldosterone 12/2022  Cushing's Syndrome Screened     - Comments Normal cortisol 07/2021  Coarctation of the Aorta N/A     - Comments BP symmetric    Relevant Labs/Studies:    Latest Ref Rng & Units 12/14/2022    3:49 PM 12/08/2022   10:42 AM 09/21/2022   12:22 PM  Basic Labs  Sodium 134 - 144 mmol/L 139  137  136   Potassium 3.5 - 5.2 mmol/L 4.5  4.0  3.5   Creatinine 0.57 - 1.00 mg/dL 1.30  8.65  7.84        Latest Ref Rng & Units 12/21/2022    3:36  PM 05/26/2022    3:44 PM  Thyroid   TSH 0.35 - 5.50 uIU/mL 1.61  0.91        Latest Ref Rng & Units 08/04/2021    2:29 PM 02/03/2020   10:08 AM  Renin/Aldosterone   Aldosterone  ng/dL 12  17           Latest Ref Rng & Units 08/04/2021    2:29 PM 02/03/2020   10:08 AM  Cortisol  Cortisol  ug/dL 8.4  69.6          Disposition:    FU with MD/APP/PharmD in 3 months    Medication Adjustments/Labs and Tests Ordered: Current medicines are reviewed at length with the patient today.  Concerns regarding medicines are outlined above.  Orders Placed This Encounter  Procedures   Basic metabolic panel   Metanephrines, plasma   No orders of the defined types were placed in this encounter.    Signed, Alver Sorrow, NP  03/02/2023 2:01 PM    [facility]

## 2023-03-06 ENCOUNTER — Other Ambulatory Visit: Payer: Self-pay | Admitting: Internal Medicine

## 2023-03-06 DIAGNOSIS — F411 Generalized anxiety disorder: Secondary | ICD-10-CM

## 2023-03-07 ENCOUNTER — Encounter (HOSPITAL_BASED_OUTPATIENT_CLINIC_OR_DEPARTMENT_OTHER): Payer: Self-pay

## 2023-03-07 LAB — BASIC METABOLIC PANEL
BUN/Creatinine Ratio: 26 (ref 12–28)
BUN: 17 mg/dL (ref 8–27)
CO2: 26 mmol/L (ref 20–29)
Calcium: 9.1 mg/dL (ref 8.7–10.3)
Chloride: 97 mmol/L (ref 96–106)
Creatinine, Ser: 0.65 mg/dL (ref 0.57–1.00)
Glucose: 97 mg/dL (ref 70–99)
Potassium: 4 mmol/L (ref 3.5–5.2)
Sodium: 138 mmol/L (ref 134–144)
eGFR: 101 mL/min/{1.73_m2} (ref >59)

## 2023-03-07 LAB — METANEPHRINES, PLASMA
Metanephrine, Free: <25 pg/mL (ref 0.0–88.0)
Normetanephrine, Free: 159.5 pg/mL (ref 0.0–244.0)

## 2023-03-14 ENCOUNTER — Other Ambulatory Visit: Payer: Self-pay | Admitting: Internal Medicine

## 2023-03-14 ENCOUNTER — Encounter: Payer: Self-pay | Admitting: Internal Medicine

## 2023-03-14 DIAGNOSIS — G4733 Obstructive sleep apnea (adult) (pediatric): Secondary | ICD-10-CM

## 2023-03-14 DIAGNOSIS — G4710 Hypersomnia, unspecified: Secondary | ICD-10-CM

## 2023-03-14 DIAGNOSIS — F331 Major depressive disorder, recurrent, moderate: Secondary | ICD-10-CM

## 2023-03-16 ENCOUNTER — Other Ambulatory Visit: Payer: Self-pay

## 2023-03-16 ENCOUNTER — Telehealth (HOSPITAL_COMMUNITY): Payer: Self-pay

## 2023-03-16 DIAGNOSIS — G4733 Obstructive sleep apnea (adult) (pediatric): Secondary | ICD-10-CM

## 2023-03-16 DIAGNOSIS — G4710 Hypersomnia, unspecified: Secondary | ICD-10-CM

## 2023-03-16 MED ORDER — ARMODAFINIL 250 MG PO TABS: 250.0000 mg | ORAL_TABLET | Freq: Every day | ORAL | 2 refills | Status: DC

## 2023-03-16 MED ORDER — ARMODAFINIL 250 MG PO TABS
250.0000 mg | ORAL_TABLET | Freq: Every day | ORAL | 2 refills | Status: DC
Start: 2023-03-16 — End: 2023-03-16

## 2023-03-16 NOTE — Telephone Encounter (Signed)
Pharmacy Patient Advocate Encounter   Received notification from CoverMyMeds that prior authorization for Armodafinil 250MG  tablets is required/requested.   Insurance verification completed.   The patient is insured through [facility] .   Per test claim: PA required; PA started via CoverMyMeds. KEY [id number] . Waiting for clinical questions to populate.

## 2023-03-20 ENCOUNTER — Other Ambulatory Visit (HOSPITAL_COMMUNITY): Payer: Self-pay

## 2023-03-20 ENCOUNTER — Ambulatory Visit (INDEPENDENT_AMBULATORY_CARE_PROVIDER_SITE_OTHER): Payer: Medicaid Other

## 2023-03-20 DIAGNOSIS — E538 Deficiency of other specified B group vitamins: Secondary | ICD-10-CM

## 2023-03-20 MED ORDER — CYANOCOBALAMIN 1000 MCG/ML IJ SOLN
1000.0000 ug | Freq: Once | INTRAMUSCULAR | Status: AC
  Administered 2023-03-20: 1000 ug via INTRAMUSCULAR

## 2023-03-20 NOTE — Telephone Encounter (Signed)
 Clinical questions have been answered and PA submitted. PA currently Pending. Please be advised that most companies allow up to 30 days to make a decision. We will advise when a determination has been made, or follow up in 1 week.   Please reach out to our team, Rx Prior Auth Pool, if you haven't heard back in a week.

## 2023-03-20 NOTE — Progress Notes (Signed)
 After obtaining consent, and per orders of Dr. Yetta Barre, injection of B12 given by Ferdie Ping. Patient instructed to report any adverse reaction to me immediately.

## 2023-03-21 ENCOUNTER — Ambulatory Visit (INDEPENDENT_AMBULATORY_CARE_PROVIDER_SITE_OTHER): Payer: Medicaid Other | Admitting: Neurology

## 2023-03-21 DIAGNOSIS — R270 Ataxia, unspecified: Secondary | ICD-10-CM

## 2023-03-21 DIAGNOSIS — I350 Nonrheumatic aortic (valve) stenosis: Secondary | ICD-10-CM

## 2023-03-21 DIAGNOSIS — G4710 Hypersomnia, unspecified: Secondary | ICD-10-CM | POA: Diagnosis not present

## 2023-03-21 DIAGNOSIS — G252 Other specified forms of tremor: Secondary | ICD-10-CM

## 2023-03-21 DIAGNOSIS — G4733 Obstructive sleep apnea (adult) (pediatric): Secondary | ICD-10-CM

## 2023-03-21 NOTE — Telephone Encounter (Signed)
Pharmacy Patient Advocate Encounter  Received notification from [facility] that Prior Authorization for Armodafinil has been DENIED.  Full denial letter will be uploaded to the media tab. See denial reason below.    PA #/Case ID/Reference #: [id number]

## 2023-03-22 NOTE — Telephone Encounter (Signed)
 Patient has been made aware.

## 2023-03-23 ENCOUNTER — Encounter: Payer: Self-pay | Admitting: Internal Medicine

## 2023-03-23 ENCOUNTER — Ambulatory Visit: Payer: Medicaid Other | Admitting: Internal Medicine

## 2023-03-23 VITALS — BP 128/70 | HR 72 | Temp 98.2°F | Ht 64.0 in | Wt 178.4 lb

## 2023-03-23 DIAGNOSIS — D649 Anemia, unspecified: Secondary | ICD-10-CM | POA: Diagnosis not present

## 2023-03-23 DIAGNOSIS — D513 Other dietary vitamin B12 deficiency anemia: Secondary | ICD-10-CM | POA: Diagnosis not present

## 2023-03-23 DIAGNOSIS — Z1231 Encounter for screening mammogram for malignant neoplasm of breast: Secondary | ICD-10-CM

## 2023-03-23 DIAGNOSIS — I10 Essential (primary) hypertension: Secondary | ICD-10-CM

## 2023-03-23 NOTE — Patient Instructions (Signed)
 Vitamin B12 Deficiency Vitamin B12 deficiency occurs when the body does not have enough of this important vitamin. The body needs this vitamin: To make red blood cells. To make DNA. This is the genetic material inside cells. To help the nerves work properly so they can carry messages from the brain to the body. Vitamin B12 deficiency can cause health problems, such as not having enough red blood cells in the blood (anemia). This can lead to nerve damage if untreated. What are the causes? This condition may be caused by: Not eating enough foods that contain vitamin B12. Not having enough stomach acid and digestive fluids to properly absorb vitamin B12 from the food that you eat. Having certain diseases that make it hard to absorb vitamin B12. These diseases include Crohn's disease, chronic pancreatitis, and cystic fibrosis. An autoimmune disorder in which the body does not make enough of a protein (intrinsic factor) within the stomach, resulting in not enough absorption of vitamin B12. Having a surgery in which part of the stomach or small intestine is removed. Taking certain medicines that make it hard for the body to absorb vitamin B12. These include: Heartburn medicines, such as antacids and proton pump inhibitors. Some medicines that are used to treat diabetes. What increases the risk? The following factors may make you more likely to develop a vitamin B12 deficiency: Being an older adult. Eating a vegetarian or vegan diet that does not include any foods that come from animals. Eating a poor diet while you are pregnant. Taking certain medicines. Having alcoholism. What are the signs or symptoms? In some cases, there are no symptoms of this condition. If the condition leads to anemia or nerve damage, various symptoms may occur, such as: Weakness. Tiredness (fatigue). Loss of appetite. Numbness or tingling in your hands and feet. Redness and burning of the tongue. Depression,  confusion, or memory problems. Trouble walking. If anemia is severe, symptoms can include: Shortness of breath. Dizziness. Rapid heart rate. How is this diagnosed? This condition may be diagnosed with a blood test to measure the level of vitamin B12 in your blood. You may also have other tests, including: A group of tests that measure certain characteristics of blood cells (complete blood count, CBC). A blood test to measure intrinsic factor. A procedure where a thin tube with a camera on the end is used to look into your stomach or intestines (endoscopy). Other tests may be needed to discover the cause of the deficiency. How is this treated? Treatment for this condition depends on the cause. This condition may be treated by: Changing your eating and drinking habits, such as: Eating more foods that contain vitamin B12. Drinking less alcohol or no alcohol. Getting vitamin B12 injections. Taking vitamin B12 supplements by mouth (orally). Your health care provider will tell you which dose is best for you. Follow these instructions at home: Eating and drinking  Include foods in your diet that come from animals and contain a lot of vitamin B12. These include: Meats and poultry. This includes beef, pork, chicken, [place], and organ meats, such as liver. Seafood. This includes clams, rainbow trout, salmon, tuna, and haddock. Eggs. Dairy foods such as milk, yogurt, and cheese. Eat foods that have vitamin B12 added to them (are fortified), such as ready-to-eat breakfast cereals. Check the label on the package to see if a food is fortified. The items listed above may not be a complete list of foods and beverages you can eat and drink. Contact a dietitian for  more information. Alcohol use Do not drink alcohol if: Your health care provider tells you not to drink. You are pregnant, may be pregnant, or are planning to become pregnant. If you drink alcohol: Limit how much you have to: 0-1 drink a  day for women. 0-2 drinks a day for men. Know how much alcohol is in your drink. In the [place] one drink equals one 12 oz bottle of beer (355 mL), one 5 oz glass of wine (148 mL), or one 1 oz glass of hard liquor (44 mL). General instructions Get vitamin B12 injections if told to by your health care provider. Take supplements only as told by your health care provider. Follow the directions carefully. Keep all follow-up visits. This is important. Contact a health care provider if: Your symptoms come back. Your symptoms get worse or do not improve with treatment. Get help right away: You develop shortness of breath. You have a rapid heart rate. You have chest pain. You become dizzy or you faint. These symptoms may be an emergency. Get help right away. Call 911. Do not wait to see if the symptoms will go away. Do not drive yourself to the hospital. Summary Vitamin B12 deficiency occurs when the body does not have enough of this important vitamin. Common causes include not eating enough foods that contain vitamin B12, not being able to absorb vitamin B12 from the food that you eat, having a surgery in which part of the stomach or small intestine is removed, or taking certain medicines. Eat foods that have vitamin B12 in them. Treatment may include making a change in the way you eat and drink, getting vitamin B12 injections, or taking vitamin B12 supplements. This information is not intended to replace advice given to you by your health care provider. Make sure you discuss any questions you have with your health care provider. Document Revised: 09/11/2020 Document Reviewed: 09/11/2020 Elsevier Patient Education  2024 [employer].

## 2023-03-23 NOTE — Progress Notes (Unsigned)
Subjective:  Patient ID: Cheryl Taylor, female    DOB: [birth year]  Age: 58 y.o. MRN: [id number]  CC: No chief complaint on file.   HPI Cheryl Taylor presents for ***  Outpatient Medications Prior to Visit  Medication Sig Dispense Refill   amLODipine (NORVASC) 10 MG tablet TAKE 1 TABLET BY MOUTH EVERY DAY 90 tablet 0   Armodafinil 250 MG tablet Take 1 tablet (250 mg total) by mouth daily. 30 tablet 2   carvedilol (COREG) 6.25 MG tablet TAKE 1 TABLET BY MOUTH 2 TIMES DAILY WITH A MEAL. 180 tablet 0   Cholecalciferol (VITAMIN D3) 50 MCG (2000 UT) capsule Take 2,000 Units by mouth daily.     escitalopram (LEXAPRO) 20 MG tablet TAKE 1 TABLET BY MOUTH EVERY DAY 90 tablet 0   hydrOXYzine (ATARAX) 10 MG tablet TAKE 1 TABLET BY MOUTH EVERY 8 HOURS AS NEEDED 270 tablet 0   losartan (COZAAR) 25 MG tablet Take 1 tablet (25 mg total) by mouth every morning. 90 tablet 0   MULTIPLE VITAMIN PO Take by mouth.     omeprazole (PRILOSEC) 20 MG capsule TAKE 1 CAPSULE BY MOUTH EVERY DAY 90 capsule 1   spironolactone (ALDACTONE) 25 MG tablet TAKE 1 TABLET (25 MG TOTAL) BY MOUTH DAILY. 90 tablet 0   SYRINGE-NEEDLE, DISP, 3 ML 25G X 1" 3 ML MISC Use to administer B12 injection 50 each 0   torsemide (DEMADEX) 20 MG tablet Take 1 tablet (20 mg total) by mouth daily. 90 tablet 3   traZODone (DESYREL) 50 MG tablet TAKE 2 TABLETS (100 MG TOTAL) BY MOUTH AS NEEDED. 180 tablet 1   valACYclovir (VALTREX) 1000 MG tablet Take 1 tablet by mouth daily (Patient taking differently: Take 1,000 mg by mouth as needed.) 90 tablet 0   cephALEXin (KEFLEX) 500 MG capsule Take 1 capsule (500 mg total) by mouth 4 (four) times daily. 20 capsule 0   mupirocin ointment (BACTROBAN) 2 % Apply 1 Application topically 2 (two) times daily. 22 g 0   vitamin k 100 MCG tablet Take 1 tablet by mouth daily.     No facility-administered medications prior to visit.    ROS Review of Systems  Objective:  BP 128/70 (BP Location: Left  Arm, Patient Position: Sitting, Cuff Size: Normal)   Pulse 72   Temp 98.2 F (36.8 C) (Oral)   Ht 5\' 4"  (1.626 m)   Wt 178 lb 6.4 oz (80.9 kg)   LMP 11/13/2017   SpO2 96%   BMI 30.62 kg/m   BP Readings from Last 3 Encounters:  03/23/23 128/70  03/02/23 136/73  02/09/23 128/81    Wt Readings from Last 3 Encounters:  03/23/23 178 lb 6.4 oz (80.9 kg)  03/02/23 184 lb 6.4 oz (83.6 kg)  01/21/23 170 lb (77.1 kg)    Physical Exam  Lab Results  Component Value Date   WBC 5.2 12/26/2022   HGB 10.7 (L) 12/26/2022   HCT 32.3 (L) 12/26/2022   PLT 330.0 12/26/2022   GLUCOSE 97 03/02/2023   CHOL 242 (H) 09/21/2022   TRIG 96.0 09/21/2022   HDL 70.70 09/21/2022   LDLDIRECT 149.0 07/30/2019   LDLCALC 152 (H) 09/21/2022   ALT 29 12/08/2022   AST 28 12/08/2022   NA 138 03/02/2023   K 4.0 03/02/2023   CL 97 03/02/2023   CREATININE 0.65 03/02/2023   BUN 17 03/02/2023   CO2 26 03/02/2023   TSH 1.61 12/21/2022   HGBA1C 5.9 12/08/2020  No results found.  Assessment & Plan:  There are no diagnoses linked to this encounter.   Follow-up: No follow-ups on file.  Sanda Linger, MD

## 2023-03-24 ENCOUNTER — Encounter: Payer: Self-pay | Admitting: Internal Medicine

## 2023-03-24 DIAGNOSIS — D649 Anemia, unspecified: Secondary | ICD-10-CM | POA: Insufficient documentation

## 2023-03-24 LAB — CBC WITH DIFFERENTIAL/PLATELET
Basophils Absolute: 0.1 10*3/uL (ref 0.0–0.1)
Basophils Relative: 0.9 % (ref 0.0–3.0)
Eosinophils Absolute: 0.1 10*3/uL (ref 0.0–0.7)
Eosinophils Relative: 2 % (ref 0.0–5.0)
HCT: 32.8 % — ABNORMAL LOW (ref 36.0–46.0)
Hemoglobin: 11 g/dL — ABNORMAL LOW (ref 12.0–15.0)
Lymphocytes Relative: 23.5 % (ref 12.0–46.0)
Lymphs Abs: 1.5 10*3/uL (ref 0.7–4.0)
MCHC: 33.6 g/dL (ref 30.0–36.0)
MCV: 94.1 fL (ref 78.0–100.0)
Monocytes Absolute: 0.5 10*3/uL (ref 0.1–1.0)
Monocytes Relative: 8.2 % (ref 3.0–12.0)
Neutro Abs: 4.2 10*3/uL (ref 1.4–7.7)
Neutrophils Relative %: 65.4 % (ref 43.0–77.0)
Platelets: 366 10*3/uL (ref 150.0–400.0)
RBC: 3.49 Mil/uL — ABNORMAL LOW (ref 3.87–5.11)
RDW: 12.8 % (ref 11.5–15.5)
WBC: 6.5 10*3/uL (ref 4.0–10.5)

## 2023-03-24 NOTE — Progress Notes (Signed)
 [facility]  Cheryl Taylor 58 year old female 01-16-1966   HOME SLEEP TEST REPORT ( by Watch PAT)   STUDY DATE:  03-21-2023, data loaded on 03-24-2023   ORDERING CLINICIAN:  REFERRING CLINICIAN:  Sanda Linger, MD    CLINICAL INFORMATION/HISTORY: patient has been seen for tremor, for Excessive daytime sleepiness.  Here f to undergo new HST  . She carried the dx of OSA  on CPAP pt started CPAP in 2017. she is no longer established with a sleep MD. DME Christoper Allegra. she complains of extreme fatigue during the day. last week she was started on armodafinil. she has felt that has helped with the daytime fatigue  She has a medical history of  Anxiety, Arthritis, Back pain, Chest pain, Depression, Alcohol and Drug use, GERD (gastroesophageal reflux disease), HTN (hypertension), Hyperlipidemia, Obesity (08/10/2014), Obesity, Pain in both feet, Perimenopausal (11/18/2013), Raynaud's disease, Screening for cervical cancer (02/06/2015), OSA/ Sleep apnea on CPAP , SOB (shortness of breath), and Vitamin D deficiency  Cheryl Taylor is a 58 y.o. female patient who is here for NEW PROBLEM seen on  12/08/2022     A new symptom of Tremor.  Chief concern according to patient :  " I need to discuss some things evolving: cardiologist has seen me and wonders if my sleep apnea the cause of systolic failure of relaxation,  LVH". She didn't bring her CPAP machine hereand has not been compliant.    Has a history of neuropathy, balance, blurred vision, left hand tremor and changes in penmanship, drooling, attention and focus problems' - I lost my last 2 jobs.  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  The patient had the first sleep study in the year 2017.  Her sleep study was performed on 01 April 2015 as an attended sleep study under the guidance of Dr. Craige Cotta.  She took Benadryl at night to help her sleep, the respiratory parameters for this attended sleep study showed an AHI of only 3.7/h and RDI of  7.5/h and REM sleep latency of 230 minutes.  It took her well over 40 minutes to fall asleep and her sleep efficiency was only 76%.  The diagnosis was hypersomnia and snoring.  She then underwent a home sleep test hoping to get a better sleep efficiency 01-20-2021, [name] is a 58 y.o. year old Caucasian female patient seen here in a Rv  on 01/20/2021. She is here to inquire if she can get a new CPAP machine - and she reports lack of compliance.  She stated her husband is going to bed early and she doesn't want to switch the CPAP on not to wake him up. She appreciates better sleep while using it. She had the machine over 5 years.  She is OK with a HST to establish a new baseline. Patient used modafinil for hypersomnia/    Calculated pAHI (per hour):   39.2/h                          REM pAHI: 34.6/h                                               NREM pAHI:    39.9/h  Epworth sleepiness score: 10/ 24 points on armodafinil   FSS endorsed at 30/ 63 points.   BMI: 30 kg/m   Neck Circumference: 16"   FINDINGS:   Sleep Summary:   Total Recording Time (hours, min): 8 hours 2 minutes       Total Sleep Time (hours, min):      7 hours 41 minutes           Percent REM (%): 25%                                      Respiratory Indices:   Calculated pAHI (per hour):   By AASM criteria 36.1/h   ,                        REM pAHI:    15.5/h                                             NREM pAHI: 43/h   -yet the device failed to calculate any central apnea.                         Positional AHI:    The patient slept the longest time in supine position 498 minutes with an AHI of 66.8/h this is severe.  She slept 129 minutes on her left side with an AHI of only 4.7 and 108 minutes on her right side with an AHI of 24/h.  There is a very dominant positional component here.  Snoring reached a mean volume of 42 dB and was present for half of the recorded time.                                                Oxygen Saturation Statistics:   Oxygen Saturation (%) Mean:   95%            O2 Saturation Range (%):     Between 84 and 100%                                  O2 Saturation (minutes) <89%:    0.1-minute       Pulse Rate Statistics:   Pulse Mean (bpm):   56 bpm              Pulse Range:     Between 45 and 86 bpm            IMPRESSION:  This HST confirms the presence of severe sleep apnea but the distribution between REM and non-REM sleep is highly unusual.  This low REM sleep apnea hypopnea index would be seen usually in central sleep apnea but the device did not indicate the presence of such.  There is intermittent bradycardia but no significant hypoxemia noted.    I would recommend for this patient to continue with CPAP- she should continue using her CPAP from 2023 with the settings of 5 through 15 cmH2O pressure and with  2 cm EPR,  Based on the new medical developments and findings  of LVH,  leaking valve disease. I am suspicious that this patient may have unrecognized central apnoeic events.   If  she is not willing or able to comply,  she may get by with simply avoiding supine sleep.  According to this night HST ,her apnea hypopnea index would be at 12.4/h instead of 36.1/h and would be considered mild by simply avoiding sleeping on her back.   Weight loss is always recommended for REM sleep dependent apneas it may reduce snoring even in other patients.  This patient could also consider using a dental device because of the lack of hypoxia and the lack of REM sleep dominant apnea -she could be a very good candidate.   RECOMMENDATION:  Plan A ) stay on CPAP,  Plan B) get a dental device and avoid supine sleep.      INTERPRETING PHYSICIAN:   Melvyn Novas, MD

## 2023-03-26 ENCOUNTER — Other Ambulatory Visit: Payer: Self-pay | Admitting: Internal Medicine

## 2023-03-26 DIAGNOSIS — I10 Essential (primary) hypertension: Secondary | ICD-10-CM

## 2023-03-27 ENCOUNTER — Ambulatory Visit: Payer: Medicaid Other

## 2023-03-28 ENCOUNTER — Encounter: Payer: Self-pay | Admitting: Neurology

## 2023-03-28 NOTE — Procedures (Signed)
 [facility]  Cheryl Taylor 58 year old female 01-16-1966   HOME SLEEP TEST REPORT ( by Watch PAT)   STUDY DATE:  03-21-2023, data loaded on 03-24-2023   ORDERING CLINICIAN:  REFERRING CLINICIAN:  Sanda Linger, MD    CLINICAL INFORMATION/HISTORY: patient has been seen for tremor, for Excessive daytime sleepiness.  Here f to undergo new HST  . She carried the dx of OSA  on CPAP pt started CPAP in 2017. she is no longer established with a sleep MD. DME Christoper Allegra. she complains of extreme fatigue during the day. last week she was started on armodafinil. she has felt that has helped with the daytime fatigue  She has a medical history of  Anxiety, Arthritis, Back pain, Chest pain, Depression, Alcohol and Drug use, GERD (gastroesophageal reflux disease), HTN (hypertension), Hyperlipidemia, Obesity (08/10/2014), Obesity, Pain in both feet, Perimenopausal (11/18/2013), Raynaud's disease, Screening for cervical cancer (02/06/2015), OSA/ Sleep apnea on CPAP , SOB (shortness of breath), and Vitamin D deficiency  Cheryl Taylor is a 58 y.o. female patient who is here for NEW PROBLEM seen on  12/08/2022     A new symptom of Tremor.  Chief concern according to patient :  " I need to discuss some things evolving: cardiologist has seen me and wonders if my sleep apnea the cause of systolic failure of relaxation,  LVH". She didn't bring her CPAP machine hereand has not been compliant.    Has a history of neuropathy, balance, blurred vision, left hand tremor and changes in penmanship, drooling, attention and focus problems' - I lost my last 2 jobs.  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  The patient had the first sleep study in the year 2017.  Her sleep study was performed on 01 April 2015 as an attended sleep study under the guidance of Dr. Craige Cotta.  She took Benadryl at night to help her sleep, the respiratory parameters for this attended sleep study showed an AHI of only 3.7/h and RDI of  7.5/h and REM sleep latency of 230 minutes.  It took her well over 40 minutes to fall asleep and her sleep efficiency was only 76%.  The diagnosis was hypersomnia and snoring.  She then underwent a home sleep test hoping to get a better sleep efficiency 01-20-2021, Cheryl Taylor is a 58 y.o. year old Caucasian female patient seen here in a Rv  on 01/20/2021. She is here to inquire if she can get a new CPAP machine - and she reports lack of compliance.  She stated her husband is going to bed early and she doesn't want to switch the CPAP on not to wake him up. She appreciates better sleep while using it. She had the machine over 5 years.  She is OK with a HST to establish a new baseline. Patient used modafinil for hypersomnia/    Calculated pAHI (per hour):   39.2/h                          REM pAHI: 34.6/h                                               NREM pAHI:    39.9/h  Epworth sleepiness score: 10/ 24 points on armodafinil   FSS endorsed at 30/ 63 points.   BMI: 30 kg/m   Neck Circumference: 16"   FINDINGS:   Sleep Summary:   Total Recording Time (hours, min): 8 hours 2 minutes       Total Sleep Time (hours, min):      7 hours 41 minutes           Percent REM (%): 25%                                      Respiratory Indices:   Calculated pAHI (per hour):   By AASM criteria 36.1/h   ,                        REM pAHI:    15.5/h                                             NREM pAHI: 43/h   -yet the device failed to calculate any central apnea.                         Positional AHI:    The patient slept the longest time in supine position 498 minutes with an AHI of 66.8/h this is severe.  She slept 129 minutes on her left side with an AHI of only 4.7 and 108 minutes on her right side with an AHI of 24/h.  There is a very dominant positional component here.  Snoring reached a mean volume of 42 dB and was present for half of the recorded time.                                                Oxygen Saturation Statistics:   Oxygen Saturation (%) Mean:   95%            O2 Saturation Range (%):     Between 84 and 100%                                  O2 Saturation (minutes) <89%:    0.1-minute       Pulse Rate Statistics:   Pulse Mean (bpm):   56 bpm              Pulse Range:     Between 45 and 86 bpm            IMPRESSION:  This HST confirms the presence of severe sleep apnea but the distribution between REM and non-REM sleep is highly unusual.  This low REM sleep apnea hypopnea index would be seen usually in central sleep apnea but the device did not indicate the presence of such.  There is intermittent bradycardia but no significant hypoxemia noted.    I would recommend for this patient to continue with CPAP- she should continue using her CPAP from 2023 with the settings of 5 through 15 cmH2O pressure and with  2 cm EPR,  Based on the new medical developments and findings  of LVH,  leaking valve disease. I am suspicious that this patient may have unrecognized central apnoeic events.   If  she is not willing or able to comply,  she may get by with simply avoiding supine sleep.  According to this night HST ,her apnea hypopnea index would be at 12.4/h instead of 36.1/h and would be considered mild by simply avoiding sleeping on her back.   Weight loss is always recommended for REM sleep dependent apneas it may reduce snoring even in other patients.  This patient could also consider using a dental device because of the lack of hypoxia and the lack of REM sleep dominant apnea -she could be a very good candidate.   RECOMMENDATION:  Plan A ) stay on CPAP,  Plan B) get a dental device and avoid supine sleep.      INTERPRETING PHYSICIAN:   Melvyn Novas, MD

## 2023-03-29 ENCOUNTER — Telehealth: Payer: Self-pay | Admitting: *Deleted

## 2023-03-29 NOTE — Telephone Encounter (Signed)
 Called pt at [phone].  LVM for pt to call office.

## 2023-03-29 NOTE — Telephone Encounter (Signed)
-----   Message from Nurse Hampton Abbot sent at 03/29/2023  1:06 PM EST -----  ----- Message ----- From: Melvyn Novas, MD Sent: 03/28/2023   6:02 PM EST To: Etta Grandchild, MD; Gna-Pod 3 Results   This HST confirms the presence of severe sleep apnea but the distribution between REM and non-REM sleep is highly unusual.  This low REM sleep apnea hypopnea index would be seen usually in central sleep apnea but the device did not indicate the presence of such.  There is intermittent bradycardia but no significant hypoxemia noted.     I would recommend for this patient to continue with CPAP- she should continue using her CPAP from 2023 with the settings of 5 through 15 cmH2O pressure and with  2 cm EPR,  Based on the new medical developments and findings of LVH,  leaking valve disease. I am suspicious that this patient may have unrecognized central apnoeic events.   RECOMMENDATION:   Plan A ) stay on CPAP,  Plan B) get a dental device and avoid supine sleep.

## 2023-03-30 NOTE — Telephone Encounter (Signed)
 Patient LVM at 12:35 pm returning phone call, would like a call back.

## 2023-03-30 NOTE — Telephone Encounter (Signed)
Contacted pt back, LVM rq call back

## 2023-03-31 ENCOUNTER — Encounter: Payer: Self-pay | Admitting: Pharmacist

## 2023-04-03 ENCOUNTER — Ambulatory Visit (INDEPENDENT_AMBULATORY_CARE_PROVIDER_SITE_OTHER): Payer: Medicaid Other

## 2023-04-03 DIAGNOSIS — D513 Other dietary vitamin B12 deficiency anemia: Secondary | ICD-10-CM

## 2023-04-03 MED ORDER — CYANOCOBALAMIN 1000 MCG/ML IJ SOLN
1000.0000 ug | Freq: Once | INTRAMUSCULAR | Status: AC
  Administered 2023-04-03: 1000 ug via INTRAMUSCULAR

## 2023-04-03 NOTE — Progress Notes (Signed)
 Patient visits today for their b-12 injection. Patient informed of what they received and tolerated the injection well. Patient notified to reach out to office if needed.

## 2023-04-10 ENCOUNTER — Ambulatory Visit (INDEPENDENT_AMBULATORY_CARE_PROVIDER_SITE_OTHER): Payer: Medicaid Other

## 2023-04-10 DIAGNOSIS — D513 Other dietary vitamin B12 deficiency anemia: Secondary | ICD-10-CM

## 2023-04-10 MED ORDER — CYANOCOBALAMIN 1000 MCG/ML IJ SOLN
1000.0000 ug | Freq: Once | INTRAMUSCULAR | Status: AC
Start: 2023-04-10 — End: 2023-04-10
  Administered 2023-04-10: 1000 ug via INTRAMUSCULAR

## 2023-04-10 NOTE — Progress Notes (Signed)
 Patient visits today for their b-12 injection. Patient informed of what they had received and tolerated injection well. Patient notified to reach out to office if needed.

## 2023-04-17 ENCOUNTER — Other Ambulatory Visit: Payer: Self-pay | Admitting: Internal Medicine

## 2023-04-17 DIAGNOSIS — I10 Essential (primary) hypertension: Secondary | ICD-10-CM

## 2023-04-19 ENCOUNTER — Inpatient Hospital Stay (HOSPITAL_BASED_OUTPATIENT_CLINIC_OR_DEPARTMENT_OTHER): Payer: Medicaid Other | Admitting: Physician Assistant

## 2023-04-19 ENCOUNTER — Inpatient Hospital Stay: Payer: Medicaid Other | Attending: Physician Assistant

## 2023-04-19 ENCOUNTER — Encounter: Payer: Self-pay | Admitting: Physician Assistant

## 2023-04-19 ENCOUNTER — Ambulatory Visit
Admission: RE | Admit: 2023-04-19 | Discharge: 2023-04-19 | Disposition: A | Discharge status: Home / Self Care | Source: Ambulatory Visit | Attending: Internal Medicine | Admitting: Internal Medicine

## 2023-04-19 VITALS — BP 122/51 | HR 72 | Temp 97.9°F | Resp 18 | Ht 64.0 in | Wt 182.8 lb

## 2023-04-19 DIAGNOSIS — Z79899 Other long term (current) drug therapy: Secondary | ICD-10-CM

## 2023-04-19 DIAGNOSIS — Z83438 Family history of other disorder of lipoprotein metabolism and other lipidemia: Secondary | ICD-10-CM

## 2023-04-19 DIAGNOSIS — Z1231 Encounter for screening mammogram for malignant neoplasm of breast: Secondary | ICD-10-CM

## 2023-04-19 DIAGNOSIS — I10 Essential (primary) hypertension: Secondary | ICD-10-CM | POA: Diagnosis not present

## 2023-04-19 DIAGNOSIS — Z841 Family history of disorders of kidney and ureter: Secondary | ICD-10-CM

## 2023-04-19 DIAGNOSIS — E785 Hyperlipidemia, unspecified: Secondary | ICD-10-CM

## 2023-04-19 DIAGNOSIS — Z818 Family history of other mental and behavioral disorders: Secondary | ICD-10-CM

## 2023-04-19 DIAGNOSIS — Z811 Family history of alcohol abuse and dependence: Secondary | ICD-10-CM

## 2023-04-19 DIAGNOSIS — K219 Gastro-esophageal reflux disease without esophagitis: Secondary | ICD-10-CM

## 2023-04-19 DIAGNOSIS — Z8249 Family history of ischemic heart disease and other diseases of the circulatory system: Secondary | ICD-10-CM

## 2023-04-19 DIAGNOSIS — R5383 Other fatigue: Secondary | ICD-10-CM

## 2023-04-19 DIAGNOSIS — D649 Anemia, unspecified: Secondary | ICD-10-CM

## 2023-04-19 DIAGNOSIS — G4730 Sleep apnea, unspecified: Secondary | ICD-10-CM

## 2023-04-19 DIAGNOSIS — Z825 Family history of asthma and other chronic lower respiratory diseases: Secondary | ICD-10-CM

## 2023-04-19 DIAGNOSIS — Z832 Family history of diseases of the blood and blood-forming organs and certain disorders involving the immune mechanism: Secondary | ICD-10-CM

## 2023-04-19 DIAGNOSIS — Z8349 Family history of other endocrine, nutritional and metabolic diseases: Secondary | ICD-10-CM

## 2023-04-19 DIAGNOSIS — Z7289 Other problems related to lifestyle: Secondary | ICD-10-CM

## 2023-04-19 LAB — CMP (CANCER CENTER ONLY)
ALT: 28 U/L (ref 0–44)
AST: 23 U/L (ref 15–41)
Albumin: 4.5 g/dL (ref 3.5–5.0)
Alkaline Phosphatase: 69 U/L (ref 38–126)
Anion gap: 7 (ref 5–15)
BUN: 16 mg/dL (ref 6–20)
CO2: 32 mmol/L (ref 22–32)
Calcium: 9 mg/dL (ref 8.9–10.3)
Chloride: 97 mmol/L — ABNORMAL LOW (ref 98–111)
Creatinine: 0.55 mg/dL (ref 0.44–1.00)
GFR, Estimated: >60 mL/min (ref >60)
Glucose, Bld: 87 mg/dL (ref 70–99)
Potassium: 3.3 mmol/L — ABNORMAL LOW (ref 3.5–5.1)
Sodium: 136 mmol/L (ref 135–145)
Total Bilirubin: 0.2 mg/dL (ref 0.0–1.2)
Total Protein: 7.1 g/dL (ref 6.5–8.1)

## 2023-04-19 LAB — TECHNOLOGIST SMEAR REVIEW: Plt Morphology: NORMAL

## 2023-04-19 LAB — FERRITIN: Ferritin: 28 ng/mL (ref 11–307)

## 2023-04-19 LAB — CBC WITH DIFFERENTIAL (CANCER CENTER ONLY)
Abs Immature Granulocytes: 0.01 10*3/uL (ref 0.00–0.07)
Basophils Absolute: 0.1 10*3/uL (ref 0.0–0.1)
Basophils Relative: 1 %
Eosinophils Absolute: 0.2 10*3/uL (ref 0.0–0.5)
Eosinophils Relative: 3 %
HCT: 32.4 % — ABNORMAL LOW (ref 36.0–46.0)
Hemoglobin: 10.6 g/dL — ABNORMAL LOW (ref 12.0–15.0)
Immature Granulocytes: 0 %
Lymphocytes Relative: 39 %
Lymphs Abs: 2.5 10*3/uL (ref 0.7–4.0)
MCH: 31 pg (ref 26.0–34.0)
MCHC: 32.7 g/dL (ref 30.0–36.0)
MCV: 94.7 fL (ref 80.0–100.0)
Monocytes Absolute: 0.8 10*3/uL (ref 0.1–1.0)
Monocytes Relative: 12 %
Neutro Abs: 2.9 10*3/uL (ref 1.7–7.7)
Neutrophils Relative %: 45 %
Platelet Count: 324 10*3/uL (ref 150–400)
RBC: 3.42 MIL/uL — ABNORMAL LOW (ref 3.87–5.11)
RDW: 12.3 % (ref 11.5–15.5)
WBC Count: 6.4 10*3/uL (ref 4.0–10.5)
nRBC: 0 % (ref 0.0–0.2)

## 2023-04-19 LAB — IRON AND IRON BINDING CAPACITY (CC-WL,HP ONLY)
Iron: 43 ug/dL (ref 28–170)
Saturation Ratios: 11 % (ref 10.4–31.8)
TIBC: 382 ug/dL (ref 250–450)
UIBC: 339 ug/dL (ref 148–442)

## 2023-04-19 LAB — C-REACTIVE PROTEIN: CRP: 0.7 mg/dL (ref <1.0)

## 2023-04-19 LAB — FOLATE: Folate: 13.5 ng/mL (ref >5.9)

## 2023-04-19 LAB — VITAMIN B12: Vitamin B-12: 632 pg/mL (ref 180–914)

## 2023-04-19 LAB — SEDIMENTATION RATE: Sed Rate: 6 mm/h (ref 0–22)

## 2023-04-19 NOTE — Progress Notes (Signed)
 [facility] [phone]) [phone]   [phone]  INITIAL CONSULT NOTE  Patient Care Team: Etta Grandchild, MD as PCP - General (Internal Medicine) Croitoru, Rachelle Hora, MD as PCP - Cardiology (Cardiology) Bonita Quin, [facility] (Pharmacist)   CHIEF COMPLAINTS/PURPOSE OF CONSULTATION:  Normocytic anemia  HISTORY OF PRESENTING ILLNESS:  Cheryl Taylor 58 y.o. female with medical history significant for GERD, HTN, hyperlipidemia, obstructive sleep apnea presents to the hematology clinic for evaluation of normocytic anemia. She is unaccompanied for this visit.   On review of the previous records, [name] has evidence of chronic anemia starting January 2020. Labs from 09/21/2022 showed evidence of vitamin B12 deficiency and was started on weekly B12 IM injections.   On exam today, [name] reports persistent fatigue and brain fog that impacts her ADLs. She denies any recent changes to her appetite or weight. She reports having some nausea and abdominal discomfort. She denies any bowel habit changes. She denies easy bruising or signs of active bleeding. She denies fevers, chills, sweats, shortness of breath, chest pain or cough. She has no other complaints. Rest of the 10 point ROS is below.   MEDICAL HISTORY:  Past Medical History:  Diagnosis Date   Alcohol abuse, in remission    Anger    Anxiety    Arthritis    feet    Back pain    Chest pain    Deficiency anemia 07/31/2019   Depression    Drug use    Fatigue 02/15/2015   GERD (gastroesophageal reflux disease)    HTN (hypertension)    Hyperlipidemia    Obesity 08/10/2014   Obesity    Pain in both feet    arthritis / Hallox Rigidus   Perimenopausal 11/18/2013   Raynaud's disease    Screening for cervical cancer 02/06/2015   Sleep apnea    wears cpap    SOB (shortness of breath)    Vitamin D deficiency     SURGICAL HISTORY: Past Surgical History:  Procedure Laterality Date   CESAREAN SECTION  2003    TONSILLECTOMY  1980    SOCIAL HISTORY: Social History   Socioeconomic History   Marital status: Married    Spouse name: Joe   Number of children: 1   Years of education: Not on file   Highest education level: Bachelor's degree (e.g., BA, AB, BS)  Occupational History   Occupation: Best boy     Comment: [employer]  Tobacco Use   Smoking status: Never   Smokeless tobacco: Never  Vaping Use   Vaping status: Never Used  Substance and Sexual Activity   Alcohol use: No    Alcohol/week: 50.0 standard drinks of alcohol    Types: 50 Glasses of wine per week    Comment: quit ETOH on 2013   Drug use: Yes    Types: Amphetamines    Comment: Ativan 4-5mg  daily, no longer taking   Sexual activity: Yes    Partners: Male    Birth control/protection: Pill    Comment: lives with husband and daughter, works at [employer], no dietary restrictions  Other Topics Concern   Not on file  Social History Narrative   Lives wih husband and daughter   Right handed   Caffeine: 1-2 cups a day   Social Drivers of Health   Financial Resource Strain: Low Risk  (03/20/2023)   Overall Financial Resource Strain (CARDIA)    Difficulty of Paying Living Expenses: Not hard at all  Food Insecurity: No Food Insecurity (03/20/2023)   Hunger Vital Sign    Worried About Running Out of Food in the Last Year: Never true    Ran Out of Food in the Last Year: Never true  Transportation Needs: No Transportation Needs (03/20/2023)   PRAPARE - Administrator, Civil Service (Medical): No    Lack of Transportation (Non-Medical): No  Physical Activity: Unknown (03/20/2023)   Exercise Vital Sign    Days of Exercise per Week: 0 days    Minutes of Exercise per Session: Not on file  Stress: Stress Concern Present (03/20/2023)   [employer] of Occupational Health - Occupational Stress Questionnaire    Feeling of Stress : To some extent  Social Connections: Moderately Integrated (03/20/2023)    Social Connection and Isolation Panel [NHANES]    Frequency of Communication with Friends and Family: More than three times a week    Frequency of Social Gatherings with Friends and Family: Three times a week    Attends Religious Services: Never    Active Member of Clubs or Organizations: Yes    Attends Engineer, structural: More than 4 times per year    Marital Status: Married  Catering manager Violence: Not on file    FAMILY HISTORY: Family History  Problem Relation Age of Onset   Kidney failure Father 87       deceased   Kidney disease Father    Obesity Father    Seizures Father    Heart disease Father 8   Hypertension Father    Hyperlipidemia Father    Stroke Father    Sleep apnea Father    Schizophrenia Brother        52yo   Alcohol abuse Brother    Other Brother        chronic pain, 30 yo   Stroke Brother    Alcohol abuse Sister        45yo   Obesity Paternal Grandfather    Stroke Sister    Depression Mother    Anxiety disorder Mother    Obesity Mother    Colon cancer Neg Hx    Colon polyps Neg Hx    Esophageal cancer Neg Hx    Rectal cancer Neg Hx    Stomach cancer Neg Hx     ALLERGIES:  has no known allergies.  MEDICATIONS:  Current Outpatient Medications  Medication Sig Dispense Refill   amLODipine (NORVASC) 10 MG tablet TAKE 1 TABLET BY MOUTH EVERY DAY 90 tablet 0   Armodafinil 250 MG tablet Take 1 tablet (250 mg total) by mouth daily. 30 tablet 2   carvedilol (COREG) 6.25 MG tablet TAKE 1 TABLET BY MOUTH 2 TIMES DAILY WITH A MEAL. 180 tablet 0   Cholecalciferol (VITAMIN D3) 50 MCG (2000 UT) capsule Take 2,000 Units by mouth daily.     escitalopram (LEXAPRO) 20 MG tablet TAKE 1 TABLET BY MOUTH EVERY DAY 90 tablet 0   hydrOXYzine (ATARAX) 10 MG tablet TAKE 1 TABLET BY MOUTH EVERY 8 HOURS AS NEEDED 270 tablet 0   losartan (COZAAR) 25 MG tablet Take 1 tablet (25 mg total) by mouth every morning. 90 tablet 0   MULTIPLE VITAMIN PO Take by mouth.      omeprazole (PRILOSEC) 20 MG capsule TAKE 1 CAPSULE BY MOUTH EVERY DAY 90 capsule 1   spironolactone (ALDACTONE) 25 MG tablet TAKE 1 TABLET (25 MG TOTAL) BY MOUTH DAILY. 90 tablet 1   torsemide (DEMADEX) 20  MG tablet Take 1 tablet (20 mg total) by mouth daily. 90 tablet 3   traZODone (DESYREL) 50 MG tablet TAKE 2 TABLETS (100 MG TOTAL) BY MOUTH AS NEEDED. 180 tablet 1   valACYclovir (VALTREX) 1000 MG tablet Take 1 tablet by mouth daily (Patient taking differently: Take 1,000 mg by mouth as needed.) 90 tablet 0   SYRINGE-NEEDLE, DISP, 3 ML 25G X 1" 3 ML MISC Use to administer B12 injection 50 each 0   No current facility-administered medications for this visit.    REVIEW OF SYSTEMS:   Constitutional: ( - ) fevers, ( - )  chills , ( - ) night sweats Eyes: ( - ) blurriness of vision, ( - ) double vision, ( - ) watery eyes Ears, nose, mouth, throat, and face: ( - ) mucositis, ( - ) sore throat Respiratory: ( - ) cough, ( - ) dyspnea, ( - ) wheezes Cardiovascular: ( - ) palpitation, ( - ) chest discomfort, ( - ) lower extremity swelling Gastrointestinal:  ( - ) nausea, ( - ) heartburn, ( - ) change in bowel habits Skin: ( - ) abnormal skin rashes Lymphatics: ( - ) new lymphadenopathy, ( - ) easy bruising Neurological: ( - ) numbness, ( - ) tingling, ( - ) new weaknesses Behavioral/Psych: ( - ) mood change, ( - ) new changes  All other systems were reviewed with the patient and are negative.  PHYSICAL EXAMINATION: ECOG PERFORMANCE STATUS: 1 - Symptomatic but completely ambulatory  Vitals:   04/19/23 0936  BP: (!) 122/51  Pulse: 72  Resp: 18  Temp: 97.9 F (36.6 C)  SpO2: 98%   Filed Weights   04/19/23 0936  Weight: 182 lb 12.8 oz (82.9 kg)    GENERAL: well appearing female in NAD  SKIN: skin color, texture, turgor are normal, no rashes or significant lesions EYES: conjunctiva are pink and non-injected, sclera clear LUNGS: clear to auscultation and percussion with normal  breathing effort HEART: regular rate & rhythm and no murmurs and no lower extremity edema Musculoskeletal: no cyanosis of digits and no clubbing  PSYCH: alert & oriented x 3, fluent speech NEURO: no focal motor/sensory deficits  LABORATORY DATA:  I have reviewed the data as listed    Latest Ref Rng & Units 04/19/2023   10:28 AM 03/23/2023    4:30 PM 12/26/2022    3:05 PM  CBC  WBC 4.0 - 10.5 K/uL 6.4  6.5  5.2   Hemoglobin 12.0 - 15.0 g/dL 16.1  09.6  04.5   Hematocrit 36.0 - 46.0 % 32.4  32.8  32.3   Platelets 150 - 400 K/uL 324  366.0  330.0        Latest Ref Rng & Units 04/19/2023   10:28 AM 03/02/2023    9:22 AM 12/14/2022    3:49 PM  CMP  Glucose 70 - 99 mg/dL 87  97  80   BUN 6 - 20 mg/dL 16  17  15    Creatinine 0.44 - 1.00 mg/dL 4.09  8.11  9.14   Sodium 135 - 145 mmol/L 136  138  139   Potassium 3.5 - 5.1 mmol/L 3.3  4.0  4.5   Chloride 98 - 111 mmol/L 97  97  94   CO2 22 - 32 mmol/L 32  26  28   Calcium 8.9 - 10.3 mg/dL 9.0  9.1  9.9   Total Protein 6.5 - 8.1 g/dL 7.1     Total  Bilirubin 0.0 - 1.2 mg/dL 0.2     Alkaline Phos 38 - 126 U/L 69     AST 15 - 41 U/L 23     ALT 0 - 44 U/L 28       RADIOGRAPHIC STUDIES: I have personally reviewed the radiological images as listed and agreed with the findings in the report. MM 3D SCREENING MAMMOGRAM BILATERAL BREAST Result Date: 04/21/2023 CLINICAL DATA:  Screening. EXAM: DIGITAL SCREENING BILATERAL MAMMOGRAM WITH TOMOSYNTHESIS AND CAD TECHNIQUE: Bilateral screening digital craniocaudal and mediolateral oblique mammograms were obtained. Bilateral screening digital breast tomosynthesis was performed. The images were evaluated with computer-aided detection. COMPARISON:  Previous exam(s). ACR Breast Density Category b: There are scattered areas of fibroglandular density. FINDINGS: There are no findings suspicious for malignancy. IMPRESSION: No mammographic evidence of malignancy. A result letter of this screening mammogram will  be mailed directly to the patient. RECOMMENDATION: Screening mammogram in one year. (Code:SM-B-01Y) BI-RADS CATEGORY  1: Negative. Electronically Signed   By: Amie Portland M.D.   On: 04/21/2023 15:20    ASSESSMENT & PLAN MARIELLE MANTIONE is a 58 y.o. female who presents to the hematology clinic for evaluation of normocytic anemia.   We discussed possible etiologies including nutritional deficiencies, CKD, inflammatory process, medication induced and bone marrow disorders. Patient has history of vitamin B12 deficiency and currently receives weekly B12 IM injections. Recommend to start with serologic workup and then consider bone marrow biopsy if underlying cause is not identified. Ms. Spitzley agreed with the plan and agreed to move forward with the workup.   #Normocytic anemia #Vitamin B12 deficiency: --Currently weekly Vitamin B12 IM injections through PCPp --Labs today to check CBC, CMP. --Check nutritional labs with iron panel, vitamin B12, MMA, folate --Check for inflammatory process with CRP and sed rate --Check for paraproteinemia with SPEP/IFE and serum free light chains --Evaluate for blood cell abnormality with peripheral smear.  --Consider bone marrow biopsy based on above workup --RTC once workup is complete  Orders Placed This Encounter  Procedures   CBC with Differential (Cancer Center Only)    Standing Status:   Future    Number of Occurrences:   1    Expiration Date:   04/18/2024   CMP (Cancer Center only)    Standing Status:   Future    Number of Occurrences:   1    Expiration Date:   04/18/2024   Ferritin    Standing Status:   Future    Number of Occurrences:   1    Expiration Date:   04/18/2024   Iron and Iron Binding Capacity ([facility] only)    Standing Status:   Future    Number of Occurrences:   1    Expiration Date:   04/18/2024   Methylmalonic acid, serum    Standing Status:   Future    Number of Occurrences:   1    Expiration Date:   04/18/2024   Vitamin B12     Standing Status:   Future    Number of Occurrences:   1    Expiration Date:   04/18/2024   Folate, Serum    Standing Status:   Future    Number of Occurrences:   1    Expiration Date:   04/18/2024   Technologist smear review    Clinical information::   anemia   Multiple Myeloma Panel (SPEP&IFE w/QIG)    Standing Status:   Future    Number of Occurrences:   1  Expiration Date:   04/18/2024   Kappa/lambda light chains    Standing Status:   Future    Number of Occurrences:   1    Expiration Date:   04/18/2024   Sedimentation rate    Standing Status:   Future    Number of Occurrences:   1    Expiration Date:   04/18/2024   C-reactive protein    Standing Status:   Future    Number of Occurrences:   1    Expiration Date:   04/18/2024    All questions were answered. The patient knows to call the clinic with any problems, questions or concerns.  I have spent a total of 60 minutes minutes of face-to-face and non-face-to-face time, preparing to see the patient, obtaining and/or reviewing separately obtained history, performing a medically appropriate examination, counseling and educating the patient, ordering tests/procedures, referring and communicating with other health care professionals, documenting clinical information in the electronic health record, independently interpreting results and communicating results to the patient, and care coordination.   Georga Kaufmann, PA-C Department of Hematology/Oncology [facility] at [facility] Phone: [phone]  Patient was seen with Dr. Leonides Schanz  I have read the above note and personally examined the patient. I agree with the assessment and plan as noted above.  Briefly Mrs. Stephany Poorman is a 58 year old female who presents for evaluation of a normocytic anemia.  Her last labs on 03/23/2023 showed white blood cell count 6.5, hemoglobin 9.0, MCV 94.1, and platelets of 366.  Due to concern for these findings the patient  was referred to hematology for further evaluation and management.  She has been diagnosed with vitamin B12 deficiency and is receiving weekly vitamin B12 injections.  Today we will perform a full workup to include iron levels, repeat vitamin B12, methylmalonic acid, and folate.  Additionally we will rule out multiple myeloma with a myeloma panel and serum free light chains.  In the event no clear etiology can be found would recommend pursuing bone marrow biopsy.  Patient voiced understanding of our findings and plan moving forward.   Ulysees Barns, MD Department of Hematology/Oncology [facility] at [facility] Phone: [phone] Pager: [phone] Email: [email or web address]

## 2023-04-20 LAB — KAPPA/LAMBDA LIGHT CHAINS
Kappa free light chain: 12.8 mg/L (ref 3.3–19.4)
Kappa, lambda light chain ratio: 1.39 (ref 0.26–1.65)
Lambda free light chains: 9.2 mg/L (ref 5.7–26.3)

## 2023-04-21 LAB — MULTIPLE MYELOMA PANEL, SERUM
Albumin SerPl Elph-Mcnc: 4.2 g/dL (ref 2.9–4.4)
Albumin/Glob SerPl: 1.7 (ref 0.7–1.7)
Alpha 1: 0.2 g/dL (ref 0.0–0.4)
Alpha2 Glob SerPl Elph-Mcnc: 0.6 g/dL (ref 0.4–1.0)
B-Globulin SerPl Elph-Mcnc: 0.9 g/dL (ref 0.7–1.3)
Gamma Glob SerPl Elph-Mcnc: 0.9 g/dL (ref 0.4–1.8)
Globulin, Total: 2.6 g/dL (ref 2.2–3.9)
IgA: 13 mg/dL — ABNORMAL LOW (ref 87–352)
IgG (Immunoglobin G), Serum: 1004 mg/dL (ref 586–1602)
IgM (Immunoglobulin M), Srm: 50 mg/dL (ref 26–217)
Total Protein ELP: 6.8 g/dL (ref 6.0–8.5)

## 2023-04-23 LAB — METHYLMALONIC ACID, SERUM: Methylmalonic Acid, Quantitative: 373 nmol/L (ref 0–378)

## 2023-04-25 ENCOUNTER — Encounter: Payer: Self-pay | Admitting: Physician Assistant

## 2023-04-29 ENCOUNTER — Encounter: Payer: Self-pay | Admitting: Internal Medicine

## 2023-05-01 ENCOUNTER — Telehealth: Payer: Self-pay | Admitting: Hematology and Oncology

## 2023-05-01 ENCOUNTER — Other Ambulatory Visit: Payer: Self-pay | Admitting: Internal Medicine

## 2023-05-01 ENCOUNTER — Other Ambulatory Visit: Payer: Self-pay | Admitting: Physician Assistant

## 2023-05-01 ENCOUNTER — Telehealth: Payer: Self-pay

## 2023-05-01 DIAGNOSIS — I10 Essential (primary) hypertension: Secondary | ICD-10-CM

## 2023-05-01 MED ORDER — FERROUS SULFATE 325 (65 FE) MG PO TBEC: 325.0000 mg | DELAYED_RELEASE_TABLET | Freq: Every day | ORAL | 3 refills | Status: DC

## 2023-05-01 NOTE — Telephone Encounter (Signed)
 Can you call [facility] and request follow up to evaluate underlying cause of B12 and iron deficiencies   Office note and labs faxed to [facility] with a request to schedule pt for an appt.  Confirmation received

## 2023-05-04 ENCOUNTER — Other Ambulatory Visit: Payer: Self-pay | Admitting: Internal Medicine

## 2023-05-04 DIAGNOSIS — D513 Other dietary vitamin B12 deficiency anemia: Secondary | ICD-10-CM

## 2023-05-04 MED ORDER — CYANOCOBALAMIN 1000 MCG/ML IJ SOLN: 1000.0000 ug | Freq: Once | INTRAMUSCULAR | 0 refills | Status: AC

## 2023-05-06 ENCOUNTER — Other Ambulatory Visit: Payer: Self-pay | Admitting: Internal Medicine

## 2023-05-06 DIAGNOSIS — E538 Deficiency of other specified B group vitamins: Secondary | ICD-10-CM

## 2023-05-06 MED ORDER — CYANOCOBALAMIN 1000 MCG/ML IJ SOLN: 1000.0000 ug | INTRAMUSCULAR | 0 refills | Status: DC

## 2023-05-09 ENCOUNTER — Telehealth: Admitting: Physician Assistant

## 2023-05-09 DIAGNOSIS — H60391 Other infective otitis externa, right ear: Secondary | ICD-10-CM | POA: Diagnosis not present

## 2023-05-09 MED ORDER — CIPRO HC 0.2-1 % OT SUSP
3.0000 [drp] | Freq: Two times a day (BID) | OTIC | 0 refills | Status: DC
Start: 2023-05-09 — End: 2023-05-16

## 2023-05-09 NOTE — Progress Notes (Signed)
 E Visit for Ear Pain - Swimmer's Ear  We are sorry that you are not feeling well. Here is how we plan to help!  Based on what you have shared with me it looks like you have an external ear infection (Otitis Externa). This is a redness or swelling, irritation, or infection of your outer ear canal. These symptoms usually occur within a few days of swimming, but can also occur from trauma in the ear canal, leading to bacterial infection. Your ear canal is a tube that goes from the opening of the ear to the eardrum.  When water stays in your ear canal, germs can grow.    The usual symptoms include:    Itchiness inside the ear  Redness or a sense of swelling in the ear  Pain when the ear is tugged on when pressure is placed on the ear  Pus draining from the infected ear    I have prescribed: Ciprofloxin 0.2% and hydrocortisone 1% otic suspension 3 drops in affected ears twice daily for 7 days  In certain cases, swimmer's ear may progress to a more serious bacterial infection of the middle or inner ear.  If you have a fever 102 and up and significantly worsening symptoms, this could indicate a more serious infection moving to the middle/inner and needs face to face evaluation in an office by a provider.  Your symptoms should improve over the next 3 days and should resolve in about 7 days.  Be sure to complete ALL of your prescription.  HOME CARE: Wash your hands frequently. If you are prescribed an ear drop, do not place the tip of the bottle on your ear or touch it with your fingers. You can take Acetaminophen 650 mg every 4-6 hours as needed for pain.  If pain is severe or moderate, you can apply a heating pad (set on low) or hot water bottle (wrapped in a towel) to outer ear for 20 minutes.  This will also increase drainage. Avoid ear plugs Do not go swimming until the symptoms are gone Do not use Q-tips After showers, help the water run out by tilting your head to one side.   GET HELP  RIGHT AWAY IF: Fever is over 102.2 degrees. You develop progressive ear pain or hearing loss. Ear symptoms persist longer than 3 days after treatment.  MAKE SURE YOU: Understand these instructions. Will watch your condition. Will get help right away if you are not doing well or get worse.  TO PREVENT SWIMMER'S EAR: Use a bathing cap or custom fitted swim molds to keep your ears dry. Towel off after swimming to dry your ears. Tilt your head or pull your earlobes to allow the water to escape your ear canal. If there is still water in your ears, consider using a hairdryer on the lowest setting.  Thank you for choosing an e-visit.  Your e-visit answers were reviewed by a board certified advanced clinical practitioner to complete your personal care plan. Depending upon the condition, your plan could have included both over the counter or prescription medications.  Please review your pharmacy choice. Make sure the pharmacy is open so you can pick up the prescription now. If there is a problem, you may contact your provider through [employer] and have the prescription routed to another pharmacy.  Your safety is important to [place]. If you have drug allergies check your prescription carefully.   For the next 24 hours you can use MyChart to ask questions about  today's visit, request a non-urgent call back, or ask for a work or school excuse. You will get an email with a survey after your eVisit asking about your experience. We would appreciate your feedback. I hope that your e-visit has been valuable and will aid in your recovery.

## 2023-05-09 NOTE — Progress Notes (Signed)
 I have spent 5 minutes in review of e-visit questionnaire, review and updating patient chart, medical decision making and response to patient.   Piedad Climes, PA-C

## 2023-05-10 ENCOUNTER — Other Ambulatory Visit: Payer: Self-pay | Admitting: Physician Assistant

## 2023-05-10 MED ORDER — CIPROFLOXACIN-DEXAMETHASONE 0.3-0.1 % OT SUSP: 4.0000 [drp] | Freq: Two times a day (BID) | OTIC | 0 refills | Status: AC

## 2023-05-10 MED ORDER — OFLOXACIN 0.3 % OT SOLN
OTIC | 0 refills | Status: DC
Start: 2023-05-10 — End: 2023-05-10

## 2023-05-10 NOTE — Addendum Note (Signed)
 Addended by: Guy Sandifer on: 05/10/2023 05:06 PM   Modules accepted: Orders

## 2023-05-18 ENCOUNTER — Other Ambulatory Visit: Payer: Self-pay | Admitting: Internal Medicine

## 2023-05-18 DIAGNOSIS — E538 Deficiency of other specified B group vitamins: Secondary | ICD-10-CM

## 2023-05-18 DIAGNOSIS — I10 Essential (primary) hypertension: Secondary | ICD-10-CM

## 2023-05-30 ENCOUNTER — Other Ambulatory Visit: Payer: Self-pay | Admitting: Internal Medicine

## 2023-05-30 DIAGNOSIS — I10 Essential (primary) hypertension: Secondary | ICD-10-CM

## 2023-05-31 MED ORDER — AMLODIPINE BESYLATE 10 MG PO TABS: 10.0000 mg | ORAL_TABLET | Freq: Every day | ORAL | 0 refills | Status: DC

## 2023-05-31 MED ORDER — CARVEDILOL 6.25 MG PO TABS: 6.2500 mg | ORAL_TABLET | Freq: Two times a day (BID) | ORAL | 0 refills | Status: DC

## 2023-06-01 ENCOUNTER — Encounter (HOSPITAL_BASED_OUTPATIENT_CLINIC_OR_DEPARTMENT_OTHER): Payer: Medicaid Other | Admitting: Family

## 2023-06-05 ENCOUNTER — Encounter (HOSPITAL_BASED_OUTPATIENT_CLINIC_OR_DEPARTMENT_OTHER): Payer: Self-pay

## 2023-06-06 ENCOUNTER — Telehealth: Payer: Self-pay | Admitting: Physician Assistant

## 2023-06-06 NOTE — Telephone Encounter (Signed)
 Rescheduled appointment per provider requested. The patient is aware of the appointment changes.

## 2023-06-09 ENCOUNTER — Other Ambulatory Visit: Payer: Self-pay | Admitting: Internal Medicine

## 2023-06-09 ENCOUNTER — Telehealth: Admitting: Physician Assistant

## 2023-06-09 DIAGNOSIS — R3989 Other symptoms and signs involving the genitourinary system: Secondary | ICD-10-CM | POA: Diagnosis not present

## 2023-06-09 DIAGNOSIS — K219 Gastro-esophageal reflux disease without esophagitis: Secondary | ICD-10-CM

## 2023-06-09 DIAGNOSIS — F5104 Psychophysiologic insomnia: Secondary | ICD-10-CM

## 2023-06-09 DIAGNOSIS — F331 Major depressive disorder, recurrent, moderate: Secondary | ICD-10-CM

## 2023-06-09 MED ORDER — CEPHALEXIN 500 MG PO CAPS
500.0000 mg | ORAL_CAPSULE | Freq: Two times a day (BID) | ORAL | 0 refills | Status: DC
Start: 2023-06-09 — End: 2023-06-20

## 2023-06-09 NOTE — Progress Notes (Signed)

## 2023-06-14 NOTE — Telephone Encounter (Signed)
 Contacted pt regarding appt, no answer. Lvm informing her ill send [facility] msg.

## 2023-06-19 ENCOUNTER — Telehealth: Admitting: Neurology

## 2023-06-19 ENCOUNTER — Encounter: Payer: Self-pay | Admitting: Neurology

## 2023-06-19 DIAGNOSIS — G4733 Obstructive sleep apnea (adult) (pediatric): Secondary | ICD-10-CM | POA: Diagnosis not present

## 2023-06-19 DIAGNOSIS — G252 Other specified forms of tremor: Secondary | ICD-10-CM

## 2023-06-19 DIAGNOSIS — G4710 Hypersomnia, unspecified: Secondary | ICD-10-CM | POA: Diagnosis not present

## 2023-06-19 DIAGNOSIS — I119 Hypertensive heart disease without heart failure: Secondary | ICD-10-CM

## 2023-06-19 NOTE — Progress Notes (Addendum)
 Virtual Visit via Video Note  I connected with Lorilee Rooks on 06/19/23 at 11:30 AM EDT by a video enabled telemedicine application. The connection failed several times .   Location:   I discussed the limitations of evaluation and management by telemedicine and the availability of in person appointments. The patient expressed understanding and agreed to proceed.  We were finally able to connect ,  the patient from her work place  and the provider ,Neomia Banner, MD , from her medical office at [facility].      We discussed today the further treatment for Ms Mynhier's persistent hypersomnia while being treated for OSA on CPAP. She reports no major improvement in terms of sleepiness. She also has had chronic insomnia.  HLA narcolepsy test was negative.  First Sleep study had confirmed a dx of OSA in 2017 at [facility] pulmonology -  by [facility] system HST.  We followed in 02-15-2021 with another HST, by watch pat, to replace her old CPAP device., the patient reported extreme fatigued while using the device sporadically . Had been started on modafinil at the time.  HST documented an AHI of 39.2/h  almost similar AHI in NREM and in REM sleep. 02 Nadir was 85% and there were 1.5 minutes of total hypoxia time. Another HST was then performed in 03-2023 , after the patient had presented  with a new tremor problem, cardiology concerned about LVH. She did not bring her CPAP, and she had no compliance data. This HST again confirmed severe OSA , AHI was 36/h REM AHI was  now 15/h and NREM AHI was 43/h. Mostly supine sleep with AHI of 68/h. No significant hypoxia time.   REC: Use PAP therapy, pursue weight loss and increase exercise. Plan B was to avoid supine sleep and get a dental device.    ROS 06-19-2023: Epworth SS endorsed at 12/ 24 and FS at 38/ 63 points.  Sleep attacks and vivid dreams were again reported,    Assessment update:   1) severe OSA : The patient  had purchased a travel CPAP and now her  older CPAP machine has broken down, she asked for a prescription to replace the home CPAP.  She still feels CPAP is not making much of a difference.    2)  she reports that Armodafinil  is no longer  treating her sleepiness or fatigue as effectively. She has vivid dreams. She is seen by oncology/ hematology and cardiology.  She uses trazodone  for sleep. I will refill modafinil for now.   3) She reports again her hands are shaking, or the right hand is shaking.   Plan : 1)CONTINUE CPAP. I am happy to order a home device with autotitration function. 5-15 cm water, 2 cm EPR , heated humidification and mask of her choice.   2) if there is a concern of effective apnea treatment from the cardiological viewpoint, I would have only compliance data to go by.     3): I will order an in lab titration for her - the goal would be to have her use CPAP that night , bringing her own mask , starting at 7 cm water and titrating to any pressure controlling the AHI to less than 7/h , then stay for MSLT.    The patient should not be using Armodafinil  prior for 14 days, neither Lexapro  or trazodone  in preparation for MSLT.    Revisit with me or NP within 4-12 weeks after CPAP-PSG and MSLT completion.  I discussed the assessment and treatment plan with the patient. The patient was provided an opportunity to ask questions and all were answered. The patient agreed with the plan and demonstrated an understanding of the instructions.   The patient was advised to call back or seek an in-person evaluation if the symptoms worsen or if the condition fails to improve as anticipated.  I provided 26 minutes of non-face-to-face time during this encounter.   Neomia Banner, MD

## 2023-06-19 NOTE — Patient Instructions (Addendum)
 Assessment update:   1) severe OSA : The patient  had purchased a travel CPAP and now her older CPAP machine has broken down, she asked for a prescription to replace the home CPAP.  She still feels CPAP is not making much of a difference.    2)  she reports that Armodafinil  is no longer  treating her sleepiness or fatigue as effectively. She has vivid dreams. She is seen by oncology/ hematology and cardiology.  She uses trazodone  for sleep. I will refill modafinil for now.   3) She reports again her hands are shaking, or the right hand is shaking.   Plan : 1)CONTINUE CPAP. I am happy to order a home device with autotitration function. 5-15 cm water, 2 cm EPR , heated humidification and mask of her choice.   2) if there is a concern of effective apnea treatment from the cardiological viewpoint, I would have only compliance data to go by.     3): I will order an in-lab-titration for her - the goal would be to have her use CPAP that night on her own mask , starting at 7 cm water and titrating to any pressure controlling the AHI to less than 7/h , then stay for MSLT.    The patient should not be using Armodafinil  prior for 14 days, neither Lexapro  or trazodone  in preparation for MSLT.      Revisit with me or NP within 4-12 weeks after CPAP-PSG and MSLT completion.  Dx Hypersomnia, organic and non-organic causes.  Hypersomnia is a condition in which a person feels very tired during the day even though the person gets plenty of sleep at night. A person with this condition may take naps during the day and may find it very difficult to wake up from sleep. Hypersomnia may affect a person's ability to think, concentrate, drive, or remember things. What are the causes? The cause of this condition may not be known. Possible causes include: Taking certain medicines. Using drugs or alcohol  or having abused drugs and alcohol  for a long time.  Sleep disorders, such as narcolepsy and sleep  apnea. Injury to the head, brain, or spinal cord. Tumors. Certain medical conditions. These include: Depression. Diabetes. Gastroesophageal reflux disease (GERD). An underactive thyroid  gland (hypothyroidism). What are the signs or symptoms? The main symptoms of hypersomnia include: Feeling very tired throughout the day, regardless of how much sleep you got the night before. Having trouble waking up. Others may find it difficult to wake you up when you are sleeping. Sleeping for longer and longer periods at a time. Taking naps throughout the day. Other symptoms may include: Feeling restless, anxious, or annoyed. Lacking energy. Having trouble with: Remembering. Speaking. Thinking. Loss of appetite. Seeing, hearing, tasting, smelling, or feeling things that are not real (hallucinations). How is this diagnosed? This condition may be diagnosed based on: Your symptoms and medical history. Your sleeping habits. Your health care provider may ask you to write down your sleeping habits in a daily sleep log, along with any symptoms you have. A series of tests that are done while you sleep (sleep study or polysomnogram). A test that measures how quickly you can fall asleep during the day (daytime nap study or multiple sleep latency test). How is this treated? This condition may be treated by: Following a regular sleep routine. Making lifestyle changes, such as changing your eating habits, getting regular exercise, and avoiding alcohol  or caffeinated beverages. Taking medicines to make you more alert (stimulants)  during the day. Treating any underlying medical causes of hypersomnia. Follow these instructions at home: Sleep habits Stick to a routine that includes going to bed and waking up at the same times every day and night. Practice a relaxing bedtime routine. This may include reading, meditation, deep breathing, or taking a warm bath before going to sleep. Exercise regularly as told  by your health care provider. However, avoid exercising in the hours right before bedtime. Keep your sleep environment at a cooler temperature, darkened, and quiet. Sleep with pillows and a mattress that are comfortable and supportive. Schedule short 20-minute naps for when you feel sleepiest during the day. Talk with your employer or teachers about your hypersomnia. If possible, adjust your schedule so that: You have a regular daytime work schedule. You can take a scheduled nap during the day. You do not have to work or be active at night. Do not eat a heavy meal for a few hours before bedtime. Eat your meals at about the same times every day. Safety  Do not drive or use machinery if you are sleepy. Ask your health care provider if it is safe for you to drive. Wear a life jacket when swimming or spending time near water. General instructions  Take over-the-counter and prescription medicines only as told by your health care provider. This includes supplements. Avoid drinking alcohol  or caffeinated beverages. Keep a sleep log that will help your health care provider manage your condition. This may include information about: What time you go to bed each night. How often you wake up at night. How many hours you sleep at night. How often and for how long you nap during the day. Any observations from others, such as leg movements during sleep, sleep walking, or snoring. Keep all follow-up visits. This is important. Contact a health care provider if: You have new symptoms. Your symptoms get worse. Get help right away if: You have thoughts about hurting yourself or someone else. Get help right away if you feel like you may hurt yourself or others, or have thoughts about taking your own life. Go to your nearest emergency room or: Call 911. Call the National Suicide Prevention Lifeline at 770-768-5294 or 988. This is open 24 hours a day. Text the Crisis Text Line at  (219) 091-6989. Summary Hypersomnia refers to a condition in which you feel very tired during the day even though you get plenty of sleep at night. A person with this condition may take naps during the day and may find it very difficult to wake up from sleep. Hypersomnia may affect a person's ability to think, concentrate, drive, or remember things. Treatment may include a regular sleep routine and making some lifestyle changes. This information is not intended to replace advice given to you by your health care provider. Make sure you discuss any questions you have with your health care provider. Document Revised: 12/28/2020 Document Reviewed: 12/28/2020 Elsevier Patient Education  2024 [employer].

## 2023-06-20 ENCOUNTER — Telehealth: Admitting: Family Medicine

## 2023-06-20 DIAGNOSIS — J019 Acute sinusitis, unspecified: Secondary | ICD-10-CM

## 2023-06-20 DIAGNOSIS — B9689 Other specified bacterial agents as the cause of diseases classified elsewhere: Secondary | ICD-10-CM | POA: Diagnosis not present

## 2023-06-20 MED ORDER — AMOXICILLIN-POT CLAVULANATE 875-125 MG PO TABS: 1.0000 | ORAL_TABLET | Freq: Two times a day (BID) | ORAL | 0 refills | Status: DC

## 2023-06-20 NOTE — Progress Notes (Signed)

## 2023-06-22 ENCOUNTER — Telehealth: Payer: Self-pay | Admitting: Neurology

## 2023-06-22 NOTE — Telephone Encounter (Signed)
 CPAP/MSLT [facility] [employer] pending

## 2023-06-22 NOTE — Telephone Encounter (Signed)
 CPAP/MSLT [facility] [employer] no auth req via fax

## 2023-06-23 MED ORDER — DOXYCYCLINE HYCLATE 100 MG PO TABS: 100.0000 mg | ORAL_TABLET | Freq: Two times a day (BID) | ORAL | 0 refills | Status: DC

## 2023-06-23 NOTE — Addendum Note (Signed)
 Addended by: Angelia Kelp on: 06/23/2023 02:17 PM   Modules accepted: Orders

## 2023-06-27 ENCOUNTER — Encounter: Payer: Self-pay | Admitting: Neurology

## 2023-06-28 ENCOUNTER — Other Ambulatory Visit: Payer: Self-pay | Admitting: Physician Assistant

## 2023-06-28 DIAGNOSIS — D649 Anemia, unspecified: Secondary | ICD-10-CM

## 2023-06-29 ENCOUNTER — Other Ambulatory Visit: Payer: Self-pay | Admitting: Internal Medicine

## 2023-06-29 ENCOUNTER — Other Ambulatory Visit: Payer: Self-pay

## 2023-06-29 ENCOUNTER — Telehealth: Payer: Self-pay | Admitting: Physician Assistant

## 2023-06-29 DIAGNOSIS — D649 Anemia, unspecified: Secondary | ICD-10-CM

## 2023-06-29 DIAGNOSIS — G4733 Obstructive sleep apnea (adult) (pediatric): Secondary | ICD-10-CM

## 2023-06-29 DIAGNOSIS — G4710 Hypersomnia, unspecified: Secondary | ICD-10-CM

## 2023-06-30 ENCOUNTER — Inpatient Hospital Stay: Attending: Physician Assistant

## 2023-06-30 ENCOUNTER — Inpatient Hospital Stay: Admitting: Physician Assistant

## 2023-06-30 DIAGNOSIS — K219 Gastro-esophageal reflux disease without esophagitis: Secondary | ICD-10-CM | POA: Insufficient documentation

## 2023-06-30 DIAGNOSIS — Z79899 Other long term (current) drug therapy: Secondary | ICD-10-CM | POA: Insufficient documentation

## 2023-06-30 DIAGNOSIS — G4730 Sleep apnea, unspecified: Secondary | ICD-10-CM | POA: Diagnosis not present

## 2023-06-30 DIAGNOSIS — D649 Anemia, unspecified: Secondary | ICD-10-CM | POA: Insufficient documentation

## 2023-06-30 DIAGNOSIS — I10 Essential (primary) hypertension: Secondary | ICD-10-CM | POA: Insufficient documentation

## 2023-06-30 DIAGNOSIS — Z8349 Family history of other endocrine, nutritional and metabolic diseases: Secondary | ICD-10-CM | POA: Insufficient documentation

## 2023-06-30 DIAGNOSIS — Z823 Family history of stroke: Secondary | ICD-10-CM | POA: Insufficient documentation

## 2023-06-30 DIAGNOSIS — Z811 Family history of alcohol abuse and dependence: Secondary | ICD-10-CM | POA: Diagnosis not present

## 2023-06-30 DIAGNOSIS — Z818 Family history of other mental and behavioral disorders: Secondary | ICD-10-CM | POA: Diagnosis not present

## 2023-06-30 DIAGNOSIS — E785 Hyperlipidemia, unspecified: Secondary | ICD-10-CM | POA: Insufficient documentation

## 2023-06-30 DIAGNOSIS — Z7289 Other problems related to lifestyle: Secondary | ICD-10-CM | POA: Insufficient documentation

## 2023-06-30 DIAGNOSIS — Z841 Family history of disorders of kidney and ureter: Secondary | ICD-10-CM | POA: Insufficient documentation

## 2023-06-30 LAB — IRON AND IRON BINDING CAPACITY (CC-WL,HP ONLY)
Iron: 81 ug/dL (ref 28–170)
Saturation Ratios: 23 % (ref 10.4–31.8)
TIBC: 360 ug/dL (ref 250–450)
UIBC: 279 ug/dL (ref 148–442)

## 2023-06-30 LAB — FERRITIN: Ferritin: 53 ng/mL (ref 11–307)

## 2023-06-30 LAB — CBC WITH DIFFERENTIAL (CANCER CENTER ONLY)
Abs Immature Granulocytes: 0.01 10*3/uL (ref 0.00–0.07)
Basophils Absolute: 0.1 10*3/uL (ref 0.0–0.1)
Basophils Relative: 1 %
Eosinophils Absolute: 0.1 10*3/uL (ref 0.0–0.5)
Eosinophils Relative: 2 %
HCT: 31.7 % — ABNORMAL LOW (ref 36.0–46.0)
Hemoglobin: 10.6 g/dL — ABNORMAL LOW (ref 12.0–15.0)
Immature Granulocytes: 0 %
Lymphocytes Relative: 33 %
Lymphs Abs: 2.1 10*3/uL (ref 0.7–4.0)
MCH: 30.1 pg (ref 26.0–34.0)
MCHC: 33.4 g/dL (ref 30.0–36.0)
MCV: 90.1 fL (ref 80.0–100.0)
Monocytes Absolute: 0.6 10*3/uL (ref 0.1–1.0)
Monocytes Relative: 9 %
Neutro Abs: 3.7 10*3/uL (ref 1.7–7.7)
Neutrophils Relative %: 55 %
Platelet Count: 377 10*3/uL (ref 150–400)
RBC: 3.52 MIL/uL — ABNORMAL LOW (ref 3.87–5.11)
RDW: 12 % (ref 11.5–15.5)
WBC Count: 6.6 10*3/uL (ref 4.0–10.5)
nRBC: 0 % (ref 0.0–0.2)

## 2023-06-30 LAB — VITAMIN B12: Vitamin B-12: 1043 pg/mL — ABNORMAL HIGH (ref 180–914)

## 2023-07-01 ENCOUNTER — Encounter: Admitting: Nurse Practitioner

## 2023-07-01 DIAGNOSIS — B9689 Other specified bacterial agents as the cause of diseases classified elsewhere: Secondary | ICD-10-CM

## 2023-07-01 NOTE — Progress Notes (Signed)
 Because you have been experiencing these symptoms off and on for a while now, I feel your condition warrants further evaluation and I recommend that you be seen for a face to face visit.  Please contact your primary care physician practice to be seen. Many offices offer virtual options to be seen via video if you are not comfortable going in person to a medical facility at this time.  NOTE: You will NOT be charged for this eVisit.  If you do not have a PCP, [facility] offers a free physician referral service available at [phone]. Our trained staff has the experience, knowledge and resources to put you in touch with a physician who is right for you.    If you are having a true medical emergency please call 911.   Your e-visit answers were reviewed by a board certified advanced clinical practitioner to complete your personal care plan.  Thank you for using e-Visits.

## 2023-07-03 ENCOUNTER — Encounter: Payer: Self-pay | Admitting: Physician Assistant

## 2023-07-03 LAB — METHYLMALONIC ACID, SERUM: Methylmalonic Acid, Quantitative: 321 nmol/L (ref 0–378)

## 2023-07-05 ENCOUNTER — Ambulatory Visit: Admitting: Physician Assistant

## 2023-07-05 ENCOUNTER — Other Ambulatory Visit

## 2023-07-05 ENCOUNTER — Other Ambulatory Visit: Payer: Self-pay | Admitting: Internal Medicine

## 2023-07-05 DIAGNOSIS — D500 Iron deficiency anemia secondary to blood loss (chronic): Secondary | ICD-10-CM

## 2023-07-11 NOTE — Telephone Encounter (Signed)
 Patient will need to taper off Lexapro , Desyrel  & Armodafinil  2 weeks prior to sleep study.

## 2023-07-23 ENCOUNTER — Encounter: Payer: Self-pay | Admitting: Internal Medicine

## 2023-07-25 ENCOUNTER — Other Ambulatory Visit: Payer: Self-pay | Admitting: Internal Medicine

## 2023-07-25 DIAGNOSIS — R21 Rash and other nonspecific skin eruption: Secondary | ICD-10-CM | POA: Insufficient documentation

## 2023-07-28 ENCOUNTER — Ambulatory Visit: Admitting: Nurse Practitioner

## 2023-07-31 ENCOUNTER — Telehealth: Payer: Self-pay | Admitting: Neurology

## 2023-07-31 NOTE — Telephone Encounter (Signed)
 Called and LVM for pt to call back and schedule her Initial Cpap f/u. Needs to be done by 9/14.

## 2023-08-01 NOTE — Telephone Encounter (Signed)
 Called and LVM for pt to call back and schedule her Initial Cpap f/u. Needs to be done by 9/14.

## 2023-08-05 ENCOUNTER — Other Ambulatory Visit: Payer: Self-pay | Admitting: Internal Medicine

## 2023-08-05 DIAGNOSIS — I10 Essential (primary) hypertension: Secondary | ICD-10-CM

## 2023-08-15 ENCOUNTER — Other Ambulatory Visit: Payer: Self-pay | Admitting: Internal Medicine

## 2023-08-15 ENCOUNTER — Other Ambulatory Visit (HOSPITAL_COMMUNITY): Payer: Self-pay

## 2023-08-15 DIAGNOSIS — L989 Disorder of the skin and subcutaneous tissue, unspecified: Secondary | ICD-10-CM | POA: Insufficient documentation

## 2023-08-16 ENCOUNTER — Telehealth: Payer: Self-pay

## 2023-08-16 ENCOUNTER — Other Ambulatory Visit (HOSPITAL_COMMUNITY): Payer: Self-pay

## 2023-08-16 NOTE — Telephone Encounter (Signed)
 Pharmacy Patient Advocate Encounter   Received notification from Patient Pharmacy that prior authorization for Nuvigil  250mg  tabs is required/requested.   Insurance verification completed.   The patient is insured through [facility] .   Per test claim: PA required; PA submitted to above mentioned insurance via CoverMyMeds Key/confirmation #/[id number] [facility] Status is pending

## 2023-08-17 ENCOUNTER — Other Ambulatory Visit (HOSPITAL_COMMUNITY): Payer: Self-pay

## 2023-08-17 ENCOUNTER — Encounter (HOSPITAL_BASED_OUTPATIENT_CLINIC_OR_DEPARTMENT_OTHER): Admitting: Family

## 2023-08-17 NOTE — Telephone Encounter (Signed)
 Pharmacy Patient Advocate Encounter  Received notification from [facility] that Prior Authorization for Nuvigil  250mg  tabs has been APPROVED from 08/16/23 to 08/15/24   PA #/Case ID/Reference #: [id number]  The patient's insurance prefers Nuvigil  and will need to use a DAW2

## 2023-08-18 NOTE — Telephone Encounter (Signed)
 Patient has been made aware.

## 2023-08-22 NOTE — Telephone Encounter (Signed)
 Pt was called again to make an attempt to schedule her initial CPAP f/u. LVM requesting pt calls to schedule.

## 2023-08-26 ENCOUNTER — Other Ambulatory Visit: Payer: Self-pay | Admitting: Internal Medicine

## 2023-08-26 DIAGNOSIS — I10 Essential (primary) hypertension: Secondary | ICD-10-CM

## 2023-08-28 ENCOUNTER — Ambulatory Visit (INDEPENDENT_AMBULATORY_CARE_PROVIDER_SITE_OTHER): Admitting: Internal Medicine

## 2023-08-28 ENCOUNTER — Encounter: Payer: Self-pay | Admitting: Internal Medicine

## 2023-08-28 VITALS — BP 136/66 | HR 64 | Ht 63.25 in | Wt 170.1 lb

## 2023-08-28 DIAGNOSIS — R109 Unspecified abdominal pain: Secondary | ICD-10-CM

## 2023-08-28 DIAGNOSIS — D649 Anemia, unspecified: Secondary | ICD-10-CM | POA: Diagnosis not present

## 2023-08-28 DIAGNOSIS — R110 Nausea: Secondary | ICD-10-CM

## 2023-08-28 NOTE — Patient Instructions (Signed)
 You have been scheduled for an endoscopy. Please follow written instructions given to you at your visit today.  If you use inhalers (even only as needed), please bring them with you on the day of your procedure.  If you take any of the following medications, they will need to be adjusted prior to your procedure:   DO NOT TAKE 7 DAYS PRIOR TO TEST- Trulicity (dulaglutide) Ozempic, Wegovy (semaglutide) Mounjaro (tirzepatide) Bydureon Bcise (exanatide extended release)  DO NOT TAKE 1 DAY PRIOR TO YOUR TEST Rybelsus (semaglutide) Adlyxin (lixisenatide) Victoza (liraglutide) Byetta (exanatide) ___________________________________________________________________________  _______________________________________________________  If your blood pressure at your visit was 140/90 or greater, please contact your primary care physician to follow up on this.  _______________________________________________________  If you are age 9 or older, your body mass index should be between 23-30. Your Body mass index is 29.9 kg/m. If this is out of the aforementioned range listed, please consider follow up with your Primary Care Provider.  If you are age 87 or younger, your body mass index should be between 19-25. Your Body mass index is 29.9 kg/m. If this is out of the aformentioned range listed, please consider follow up with your Primary Care Provider.   ________________________________________________________  The [facility] providers would like to encourage you to use MYCHART to communicate with providers for non-urgent requests or questions.  Due to long hold times on the telephone, sending your provider a message by [facility] may be a faster and more efficient way to get a response.  Please allow 48 business hours for a response.  Please remember that this is for non-urgent requests.  _______________________________________________________  [facility] is using a team-based approach to  care.  Your team is made up of your doctor and two to three APPS. Our APPS (Nurse Practitioners and Physician Assistants) work with your physician to ensure care continuity for you. They are fully qualified to address your health concerns and develop a treatment plan. They communicate directly with your gastroenterologist to care for you. Seeing the Advanced Practice Practitioners on your physician's team can help you by facilitating care more promptly, often allowing for earlier appointments, access to diagnostic testing, procedures, and other specialty referrals.

## 2023-08-28 NOTE — Progress Notes (Signed)
 HISTORY OF PRESENT ILLNESS:  Cheryl Taylor is a 58 y.o. female with past medical history as listed below.  She is sent today by hematology regarding normocytic anemia.  She has a history of B12 deficiency with adequate replacement currently.  Hemoglobin has been unchanged over the past year at about 10.6.  Recent iron studies are normal (lower end of normal).  She did undergo complete colonoscopy with Dr. Eda in March 2021.  She was found to have a small adenomatous polyp and internal hemorrhoids.  Otherwise normal.  No prior EGD.  Patient tells me that she has been on PPI long-term for GERD.  No active GERD symptoms.  She does report constant nausea and 20 pound weight loss over the past several months.  She attributes this to poor appetite.  She does have some issues with medication related constipation but tells me that taking prunes results of good bowel movements.  No melena or hematochezia.  She has nonspecific fatigue.  She does have CPAP.  REVIEW OF SYSTEMS:  All non-GI ROS negative except for arthritis, anxiety, fatigue  Past Medical History:  Diagnosis Date   Alcohol  abuse, in remission    Anger    Anxiety    Arthritis    feet    Back pain    Chest pain    Deficiency anemia 07/31/2019   Depression    Drug use    Fatigue 02/15/2015   GERD (gastroesophageal reflux disease)    HTN (hypertension)    Hyperlipidemia    Obesity 08/10/2014   Obesity    Pain in both feet    arthritis / Hallox Rigidus   Perimenopausal 11/18/2013   Raynaud's disease    Screening for cervical cancer 02/06/2015   Sleep apnea    wears cpap    SOB (shortness of breath)    Vitamin D  deficiency     Past Surgical History:  Procedure Laterality Date   CESAREAN SECTION  2003   TONSILLECTOMY  1980    Social History [place] CATAR[place]  reports that she has never smoked. She has never used smokeless tobacco. She reports current drug use. Drug: Amphetamines. She reports that she does not drink  alcohol .  family history includes Alcohol  abuse in her brother and sister; Anxiety disorder in her mother; Depression in her mother; Heart disease (age of onset: 54) in her father; Hyperlipidemia in her father; Hypertension in her father; Kidney disease in her father; Kidney failure (age of onset: 81) in her father; Obesity in her father, mother, and paternal grandfather; Other in her brother; Schizophrenia in her brother; Seizures in her father; Sleep apnea in her father; Stroke in her brother, father, and sister.  No Known Allergies     PHYSICAL EXAMINATION: Vital signs: BP 136/66 (BP Location: Right Arm, Patient Position: Sitting, Cuff Size: Normal)   Pulse 64   Ht 5' 3.25 (1.607 m)   Wt 170 lb 2 oz (77.2 kg)   LMP 11/13/2017   BMI 29.90 kg/m   Constitutional: generally well-appearing, no acute distress Psychiatric: alert and oriented x3, cooperative Eyes: extraocular movements intact, anicteric, conjunctiva pink Mouth: oral pharynx moist, no lesions Neck: supple no lymphadenopathy Cardiovascular: heart regular rate and rhythm, no murmur Lungs: clear to auscultation bilaterally Abdomen: soft, nontender, nondistended, no obvious ascites, no peritoneal signs, normal bowel sounds, no organomegaly Rectal: Omitted Extremities: no clubbing, cyanosis, or lower extremity edema bilaterally Skin: no lesions on visible extremities Neuro: No focal deficits.  Cranial nerves intact  ASSESSMENT:  1.  Normocytic anemia 2.  History of B12 deficiency. 3.  Weight loss 4.  Nausea without vomiting 5.  History of small adenomatous polyp.  Otherwise unremarkable colonoscopy 2021 6.  General Medical problems 7.  GERD.  On PPI   PLAN:  1.  Upper endoscopy to evaluate nausea, weight loss, and anemia.The nature of the procedure, as well as the risks, benefits, and alternatives were carefully and thoroughly reviewed with the patient. Ample time for discussion and questions allowed. The patient  understood, was satisfied, and agreed to proceed. 2.  Continue PPI 3.  If upper endoscopy unremarkable, consider advanced imaging such as CT scan. 4.  Ongoing hematologic care with Dr. Federico 5.  Ongoing general medical care Dr. Joshua Total time of 45 minutes spent preparing to see the patient, reviewing multiple records, laboratories, and endoscopy reports.  Obtaining comprehensive history, performing medically appropriate physical exam, counseling and educating the patient regarding the above listed issues, ordering endoscopic procedure, and documenting clinical information in the health record

## 2023-09-04 ENCOUNTER — Ambulatory Visit: Admitting: Internal Medicine

## 2023-09-04 ENCOUNTER — Encounter: Payer: Self-pay | Admitting: Internal Medicine

## 2023-09-04 ENCOUNTER — Ambulatory Visit (INDEPENDENT_AMBULATORY_CARE_PROVIDER_SITE_OTHER)

## 2023-09-04 ENCOUNTER — Ambulatory Visit: Payer: Self-pay | Admitting: Internal Medicine

## 2023-09-04 ENCOUNTER — Ambulatory Visit: Payer: Medicaid Other | Admitting: Internal Medicine

## 2023-09-04 VITALS — BP 136/68 | HR 69 | Temp 98.0°F | Resp 16 | Ht 63.25 in | Wt 170.4 lb

## 2023-09-04 DIAGNOSIS — Z23 Encounter for immunization: Secondary | ICD-10-CM | POA: Diagnosis not present

## 2023-09-04 DIAGNOSIS — M4802 Spinal stenosis, cervical region: Secondary | ICD-10-CM | POA: Insufficient documentation

## 2023-09-04 DIAGNOSIS — E269 Hyperaldosteronism, unspecified: Secondary | ICD-10-CM

## 2023-09-04 DIAGNOSIS — M5412 Radiculopathy, cervical region: Secondary | ICD-10-CM
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
## 2023-09-05 ENCOUNTER — Encounter: Payer: Self-pay | Admitting: Internal Medicine

## 2023-09-08 ENCOUNTER — Encounter (HOSPITAL_BASED_OUTPATIENT_CLINIC_OR_DEPARTMENT_OTHER): Payer: Self-pay | Admitting: Family

## 2023-09-08 ENCOUNTER — Ambulatory Visit (HOSPITAL_BASED_OUTPATIENT_CLINIC_OR_DEPARTMENT_OTHER): Admitting: Family

## 2023-09-08 VITALS — BP 130/72 | HR 65 | Ht 64.0 in | Wt 166.0 lb

## 2023-09-08 DIAGNOSIS — I10 Essential (primary) hypertension: Secondary | ICD-10-CM | POA: Diagnosis not present

## 2023-09-08 DIAGNOSIS — Z8249 Family history of ischemic heart disease and other diseases of the circulatory system: Secondary | ICD-10-CM

## 2023-09-08 NOTE — Patient Instructions (Signed)
 Medication Instructions:  Continue your current medications   Labwork: Your physician recommends that you return for lab work today: BMET, lipoprotein a  Follow-Up: Please follow up in 1 year in [facility] with Dr. Raford, Reche Finder, NP or Allean Mink PharmD    Special Instructions:   Let us  know if your blood pressure is consistently more than 130/80 at home.   Heart Healthy Diet Recommendations: A low-salt diet is recommended. Meats should be grilled, baked, or boiled. Avoid fried foods. Focus on lean protein sources like fish or chicken with vegetables and fruits. The American Heart Association is a Chief Technology Officer!  American Heart Association Diet and Lifeystyle Recommendations   Exercise recommendations: The American Heart Association recommends 150 minutes of moderate intensity exercise weekly. Try 30 minutes of moderate intensity exercise 4-5 times per week. This could include walking, jogging, or swimming.

## 2023-09-08 NOTE — Progress Notes (Signed)
 [facility] Assessment:    Date:  09/08/2023   ID:  Cheryl Taylor, DOB 11-Sep-1965, MRN [id number]  PCP:  Joshua Debby CROME, MD  Cardiologist:  Jerel Balding, MD  Nephrologist:  Referring MD: Joshua Debby CROME, MD   CC: Hypertension  History of Present Illness:    Cheryl Taylor is a 58 y.o. female with a hx of diastolic heart failure, OSA (AHI 39/hr), HTN, HLD, cardiac murmur, chronic mild normocytic anemia, mild PAH, mitral regurgitation, remote alcohol  abuse currently in remission here to establish care in the [facility].   Presented with hyponatremia 2021 prescribed  demeclocycline.  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  Prior renin aldosterone levels normal 2022 and 2023.  No renal artery stenosis by duplex 2023.  Echo 11/2022 normal LVEF, moderate LVH, indeterminate diastolic function, mild to moderate MR, moderate TR, mild aortic stenosis (mean gradient 11 mmHg).  However based on Dr. Tyrone review gradients across the valve slightly increased but no evidence of systolic anterior motion of the mitral valve flow gradients were increased due to high cardiac output.  12/08/22 visit with Dr. Sandi poor adherence to CPAP. New sleep study ordered and recommended to  continue using CPAP from 2023. Sleep study scheduled 03/21/23. She was seen by PharmD at her PCP team 02/09/23 and started on Losartan  25mg  daily.   Established with [facility] 03/02/23 having been diagnosed with hypertension more than 20 years prior. No tobacco use. Alcohol  use previously having quit in 2013. For exercise she she had no formal routine as limited by imbalance. She was following a low sodium diet and limiting to one cup of coffee. She was awaiting replacement of CPAP.   Presents today for follow up independently. Has ben undergoing workup of anemia with recent endoscopy with GI. BP has been well controlled per her  report with home readings 125-140/60-80. No chest pain, exertional dyspnea. She was recently idagnosed with spondylosis of the cervical spine and has upcoming visit with neurosurgery to establish care.   Previous antihypertensives: Amlodipine  - swelling Irbesartan  (diarrhea) Valsartan  Chlorthalidone  Chlorthalidone  Carvedilol  XR - denied by insurance   Past Medical History:  Diagnosis Date   Alcohol  abuse, in remission    Anger    Anxiety    Arthritis    feet    Back pain    Chest pain    Deficiency anemia 07/31/2019   Depression    Drug use    Fatigue 02/15/2015   GERD (gastroesophageal reflux disease)    HTN (hypertension)    Hyperlipidemia    Obesity 08/10/2014   Obesity    Pain in both feet    arthritis / Hallox Rigidus   Perimenopausal 11/18/2013   Raynaud's disease    Screening for cervical cancer 02/06/2015   Sleep apnea    wears cpap    SOB (shortness of breath)    Vitamin D  deficiency     Past Surgical History:  Procedure Laterality Date   CESAREAN SECTION  2003   TONSILLECTOMY  1980    Current Medications: Current Meds  Medication Sig   amLODipine  (NORVASC ) 10 MG tablet Take 1 tablet (10 mg total) by mouth daily.   Armodafinil  250 MG tablet PA DENIED TAKE 1 TABLET BY MOUTH EVERY DAY   carvedilol  (COREG ) 6.25 MG tablet TAKE 1 TABLET BY MOUTH 2 TIMES DAILY WITH A MEAL.   Cholecalciferol (VITAMIN D3) 50 MCG (2000 UT)  capsule Take 2,000 Units by mouth daily.   cyanocobalamin  (VITAMIN B12) 1000 MCG/ML injection INJECT 1 ACT AS DIRECTED EVERY 30 (THIRTY) DAYS.   escitalopram  (LEXAPRO ) 20 MG tablet TAKE 1 TABLET BY MOUTH EVERY DAY   hydrOXYzine  (ATARAX ) 10 MG tablet TAKE 1 TABLET BY MOUTH EVERY 8 HOURS AS NEEDED   losartan  (COZAAR ) 25 MG tablet TAKE 1 TABLET BY MOUTH EVERY DAY IN THE MORNING   MULTIPLE VITAMIN PO Take by mouth.   omeprazole  (PRILOSEC) 20 MG capsule TAKE 1 CAPSULE BY MOUTH EVERY DAY   spironolactone  (ALDACTONE ) 25 MG tablet TAKE 1 TABLET (25 MG  TOTAL) BY MOUTH DAILY.   torsemide  (DEMADEX ) 20 MG tablet Take 1 tablet (20 mg total) by mouth daily.   traZODone  (DESYREL ) 50 MG tablet TAKE 2 TABLETS (100 MG TOTAL) BY MOUTH AS NEEDED.   valACYclovir  (VALTREX ) 1000 MG tablet Take 1 tablet by mouth daily (Patient taking differently: Take 1,000 mg by mouth as needed.)     Allergies:   Patient has no known allergies.   Social History   Socioeconomic History   Marital status: Married    Spouse name: Joe   Number of children: 1   Years of education: Not on file   Highest education level: Bachelor's degree (e.g., BA, AB, BS)  Occupational History   Occupation: Best boy     Comment: [employer]  Tobacco Use   Smoking status: Never   Smokeless tobacco: Never  Vaping Use   Vaping status: Never Used  Substance and Sexual Activity   Alcohol  use: No    Alcohol /week: 50.0 standard drinks of alcohol     Types: 50 Glasses of wine per week    Comment: quit ETOH on 2013   Drug use: Yes    Types: Amphetamines    Comment: Ativan  4-5mg  daily, no longer taking   Sexual activity: Yes    Partners: Male    Birth control/protection: Pill    Comment: lives with husband and daughter, works at [employer], no dietary restrictions  Other Topics Concern   Not on file  Social History Narrative   Lives wih husband and daughter   Right handed   Caffeine: 1-2 cups a day   Social Drivers of Health   Financial Resource Strain: Low Risk  (03/20/2023)   Overall Financial Resource Strain (CARDIA)    Difficulty of Paying Living Expenses: Not hard at all  Food Insecurity: No Food Insecurity (03/20/2023)   Hunger Vital Sign    Worried About Running Out of Food in the Last Year: Never true    Ran Out of Food in the Last Year: Never true  Transportation Needs: No Transportation Needs (03/20/2023)   PRAPARE - Administrator, Civil Service (Medical): No    Lack of Transportation (Non-Medical): No  Physical Activity: Unknown (03/20/2023)    Exercise Vital Sign    Days of Exercise per Week: 0 days    Minutes of Exercise per Session: Not on file  Stress: Stress Concern Present (03/20/2023)   [employer] of Occupational Health - Occupational Stress Questionnaire    Feeling of Stress : To some extent  Social Connections: Moderately Integrated (03/20/2023)   Social Connection and Isolation Panel    Frequency of Communication with Friends and Family: More than three times a week    Frequency of Social Gatherings with Friends and Family: Three times a week    Attends Religious Services: Never    Active Member of Clubs or  Organizations: Yes    Attends Engineer, structural: More than 4 times per year    Marital Status: Married     Family History: The patient's family history includes Alcohol  abuse in her brother and sister; Anxiety disorder in her mother; Depression in her mother; Heart disease (age of onset: 70) in her father; Hyperlipidemia in her father; Hypertension in her father; Kidney disease in her father; Kidney failure (age of onset: 73) in her father; Obesity in her father, mother, and paternal grandfather; Other in her brother; Schizophrenia in her brother; Seizures in her father; Sleep apnea in her father; Stroke in her brother, father, and sister. There is no history of Colon cancer, Colon polyps, Esophageal cancer, Rectal cancer, or Stomach cancer.  ROS:   Please see the history of present illness.     All other systems reviewed and are negative.  EKGs/Labs/Other Studies Reviewed:         Recent Labs: 12/14/2022: BNP 93.2 12/21/2022: TSH 1.61 04/19/2023: ALT 28; BUN 16; Creatinine 0.55; Potassium 3.3; Sodium 136 06/30/2023: Hemoglobin 10.6; Platelet Count 377   Recent Lipid Panel    Component Value Date/Time   CHOL 242 (H) 09/21/2022 1222   CHOL 190 08/29/2016 1110   TRIG 96.0 09/21/2022 1222   HDL 70.70 09/21/2022 1222   HDL 47 08/29/2016 1110   CHOLHDL 3 09/21/2022 1222   VLDL 19.2  09/21/2022 1222   LDLCALC 152 (H) 09/21/2022 1222   LDLCALC 123 (H) 08/29/2016 1110   LDLDIRECT 149.0 07/30/2019 1627    Physical Exam:   VS:  BP 130/72   Pulse 65   Ht 5' 4 (1.626 m)   Wt 166 lb (75.3 kg)   LMP 11/13/2017   SpO2 98%   BMI 28.49 kg/m  , BMI Body mass index is 28.49 kg/m. GENERAL:  Well appearing HEENT: Pupils equal round and reactive, fundi not visualized, oral mucosa unremarkable NECK:  No jugular venous distention, waveform within normal limits, carotid upstroke brisk and symmetric, no bruits, no thyromegaly LYMPHATICS:  No cervical adenopathy LUNGS:  Clear to auscultation bilaterally HEART:  RRR.  PMI not displaced or sustained,S1 and S2 within normal limits, no S3, no S4, no clicks, no rubs, no murmurs ABD:  Flat, positive bowel sounds normal in frequency in pitch, no bruits, no rebound, no guarding, no midline pulsatile mass, no hepatomegaly, no splenomegaly EXT:  2 plus pulses throughout, no edema, no cyanosis no clubbing SKIN:  No rashes no nodules NEURO:  Cranial nerves II through XII grossly intact, motor grossly intact throughout PSYCH:  Cognitively intact, oriented to person place and time   ASSESSMENT/PLAN:    HTN -BP controlled in clinic and at home.  Home BP cuff previously found to be accurate.  Continue present regimen amlodipine  10 mg daily, carvedilol  625 mg twice daily, losartan  25 mg daily, spironolactone  25 mg daily, torsemide  20 mg daily.   Prior secondary workup unremarkable. If combination tablet needed, could consider transition to Amlodipine -Olmesartan in the future.   OSA- CPAP compliance encouraged. Managed by Dr. Chalice.   HLD-09/21/2022 total cholesterol 242, triglycerides 96, HDL 70, LDL 152. The 10-year ASCVD risk score (Arnett DK, et al., 2019) is: 4.5%. Recommend lowering lipids through diet and exercise.Due to family history of cardiovsacular disease, lipoprotein a today.   Diastolic heart failure / Valvular abnormality  (AS/MR) / PAH -Per primary cardiology team.  No evidence of volume overload.  Reports swelling has been improved from prior on torsemide  20 mg daily,  spironolactone  25 mg daily.NYHA I.   Hx of hyponatremia - In 2021 previously requiring demeclocycline.  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  Monitor with periodic lab work. BMET today.  Screening for Secondary Hypertension:     03/02/2023    1:43 PM  Causes  Renovascular HTN Screened     - Comments Renal artery duplex no stenosis  Sleep Apnea Screened     - Comments Wearing CPAP inconsistently.  Repeat sleep today upcoming  Thyroid  Disease Screened     - Comments Unremarkable TSH 12/2022  Hyperaldosteronism Screened     - Comments Unremarkable renin aldosterone 12/2022  Cushing's Syndrome Screened     - Comments Normal cortisol 07/2021  Coarctation of the Aorta N/A     - Comments BP symmetric    Relevant Labs/Studies:    Latest Ref Rng & Units 04/19/2023   10:28 AM 03/02/2023    9:22 AM 12/14/2022    3:49 PM  Basic Labs  Sodium 135 - 145 mmol/L 136  138  139   Potassium 3.5 - 5.1 mmol/L 3.3  4.0  4.5   Creatinine 0.44 - 1.00 mg/dL 9.44  9.34  9.29        Latest Ref Rng & Units 12/21/2022    3:36 PM 05/26/2022    3:44 PM  Thyroid    TSH 0.35 - 5.50 uIU/mL 1.61  0.91        Latest Ref Rng & Units 08/04/2021    2:29 PM 02/03/2020   10:08 AM  Renin/Aldosterone   Aldosterone  ng/dL 12  17        Latest Ref Rng & Units 03/02/2023    9:22 AM  Metanephrines/Catecholamines   Metanephrines 0.0 - 88.0 pg/mL <25.0   Normetanephrines  0.0 - 244.0 pg/mL 159.5        Latest Ref Rng & Units 08/04/2021    2:29 PM 02/03/2020   10:08 AM  Cortisol  Cortisol  ug/dL 8.4  79.7          Disposition:    FU with MD/APP/PharmD in 12 months    Medication Adjustments/Labs and Tests Ordered: Current medicines are reviewed at length with the patient today.  Concerns regarding medicines are outlined  above.  Orders Placed This Encounter  Procedures   Basic metabolic panel with GFR   Lipoprotein A (LPA)   No orders of the defined types were placed in this encounter.    Signed, Reche GORMAN Finder, NP  09/08/2023 9:18 PM    [facility]

## 2023-09-11 ENCOUNTER — Ambulatory Visit (HOSPITAL_BASED_OUTPATIENT_CLINIC_OR_DEPARTMENT_OTHER): Payer: Self-pay | Admitting: Family

## 2023-09-11 ENCOUNTER — Encounter: Payer: Self-pay | Admitting: Internal Medicine

## 2023-09-11 ENCOUNTER — Ambulatory Visit (AMBULATORY_SURGERY_CENTER): Admitting: Internal Medicine

## 2023-09-11 ENCOUNTER — Other Ambulatory Visit: Payer: Self-pay | Admitting: Medical Genetics

## 2023-09-11 VITALS — BP 114/60 | HR 60 | Temp 98.4°F | Resp 14 | Ht 60.0 in | Wt 170.0 lb

## 2023-09-11 DIAGNOSIS — R110 Nausea: Secondary | ICD-10-CM

## 2023-09-11 DIAGNOSIS — R634 Abnormal weight loss: Secondary | ICD-10-CM

## 2023-09-11 DIAGNOSIS — R6881 Early satiety: Secondary | ICD-10-CM | POA: Diagnosis not present

## 2023-09-11 DIAGNOSIS — D649 Anemia, unspecified: Secondary | ICD-10-CM

## 2023-09-11 LAB — BASIC METABOLIC PANEL WITH GFR
BUN/Creatinine Ratio: 28 (ref 12–28)
BUN: 22 mg/dL (ref 8–27)
CO2: 25 mmol/L (ref 20–29)
Calcium: 9.6 mg/dL (ref 8.7–10.3)
Chloride: 95 mmol/L — ABNORMAL LOW (ref 96–106)
Creatinine, Ser: 0.8 mg/dL (ref 0.57–1.00)
Glucose: 96 mg/dL (ref 70–99)
Potassium: 3.9 mmol/L (ref 3.5–5.2)
Sodium: 140 mmol/L (ref 134–144)
eGFR: 84 mL/min/1.73 (ref >59)

## 2023-09-11 LAB — LIPOPROTEIN A (LPA): Lipoprotein (a): 24.3 nmol/L (ref <75.0)

## 2023-09-11 MED ORDER — SODIUM CHLORIDE 0.9 % IV SOLN: 500.0000 mL | Freq: Once | INTRAVENOUS | Status: DC

## 2023-09-11 NOTE — Progress Notes (Signed)
 Expand All Collapse All HISTORY OF PRESENT ILLNESS:   Cheryl Taylor is a 58 y.o. female with past medical history as listed below.  She is sent today by hematology regarding normocytic anemia.  She has a history of B12 deficiency with adequate replacement currently.  Hemoglobin has been unchanged over the past year at about 10.6.  Recent iron studies are normal (lower end of normal).  She did undergo complete colonoscopy with Dr. Eda in March 2021.  She was found to have a small adenomatous polyp and internal hemorrhoids.  Otherwise normal.  No prior EGD.   Patient tells me that she has been on PPI long-term for GERD.  No active GERD symptoms.  She does report constant nausea and 20 pound weight loss over the past several months.  She attributes this to poor appetite.  She does have some issues with medication related constipation but tells me that taking prunes results of good bowel movements.  No melena or hematochezia.  She has nonspecific fatigue.  She does have CPAP.   REVIEW OF SYSTEMS:   All non-GI ROS negative except for arthritis, anxiety, fatigue       Past Medical History:  Diagnosis Date   Alcohol  abuse, in remission     Anger     Anxiety     Arthritis      feet    Back pain     Chest pain     Deficiency anemia 07/31/2019   Depression     Drug use     Fatigue 02/15/2015   GERD (gastroesophageal reflux disease)     HTN (hypertension)     Hyperlipidemia     Obesity 08/10/2014   Obesity     Pain in both feet      arthritis / Hallox Rigidus   Perimenopausal 11/18/2013   Raynaud's disease     Screening for cervical cancer 02/06/2015   Sleep apnea      wears cpap    SOB (shortness of breath)     Vitamin D  deficiency                 Past Surgical History:  Procedure Laterality Date   CESAREAN SECTION   2003   TONSILLECTOMY   1980          Social History Cheryl Taylor  reports that she has never smoked. She has never used smokeless tobacco. She reports  current drug use. Drug: Amphetamines. She reports that she does not drink alcohol .   family history includes Alcohol  abuse in her brother and sister; Anxiety disorder in her mother; Depression in her mother; Heart disease (age of onset: 78) in her father; Hyperlipidemia in her father; Hypertension in her father; Kidney disease in her father; Kidney failure (age of onset: 51) in her father; Obesity in her father, mother, and paternal grandfather; Other in her brother; Schizophrenia in her brother; Seizures in her father; Sleep apnea in her father; Stroke in her brother, father, and sister.   Allergies  No Known Allergies         PHYSICAL EXAMINATION: Vital signs: BP 136/66 (BP Location: Right Arm, Patient Position: Sitting, Cuff Size: Normal)   Pulse 64   Ht 5' 3.25 (1.607 m)   Wt 170 lb 2 oz (77.2 kg)   LMP 11/13/2017   BMI 29.90 kg/m   Constitutional: generally well-appearing, no acute distress Psychiatric: alert and oriented x3, cooperative Eyes: extraocular movements intact, anicteric, conjunctiva pink Mouth: oral pharynx moist,  no lesions Neck: supple no lymphadenopathy Cardiovascular: heart regular rate and rhythm, no murmur Lungs: clear to auscultation bilaterally Abdomen: soft, nontender, nondistended, no obvious ascites, no peritoneal signs, normal bowel sounds, no organomegaly Rectal: Omitted Extremities: no clubbing, cyanosis, or lower extremity edema bilaterally Skin: no lesions on visible extremities Neuro: No focal deficits.  Cranial nerves intact   ASSESSMENT:   1.  Normocytic anemia 2.  History of B12 deficiency. 3.  Weight loss 4.  Nausea without vomiting 5.  History of small adenomatous polyp.  Otherwise unremarkable colonoscopy 2021 6.  General Medical problems 7.  GERD.  On PPI     PLAN:   1.  Upper endoscopy to evaluate nausea, weight loss, and anemia.The nature of the procedure, as well as the risks, benefits, and alternatives were carefully and  thoroughly reviewed with the patient. Ample time for discussion and questions allowed. The patient understood, was satisfied, and agreed to proceed. 2.  Continue PPI 3.  If upper endoscopy unremarkable, consider advanced imaging such as CT scan. 4.  Ongoing hematologic care with Dr. Federico 5.  Ongoing general medical care Dr. Joshua

## 2023-09-11 NOTE — Patient Instructions (Signed)
    Normal Esophagus   A Gastric emptying scan will be scheduled by Dr Nancyann office personnel   Continue previous diet & medications   YOU HAD AN ENDOSCOPIC PROCEDURE TODAY AT THE [facility]:   Refer to the procedure report that was given to you for any specific questions about what was found during the examination.  If the procedure report does not answer your questions, please call your gastroenterologist to clarify.  If you requested that your care partner not be given the details of your procedure findings, then the procedure report has been included in a sealed envelope for you to review at your convenience later.  YOU SHOULD EXPECT: Some feelings of bloating in the abdomen. Passage of more gas than usual.  Walking can help get rid of the air that was put into your GI tract during the procedure and reduce the bloating. If you had a lower endoscopy (such as a colonoscopy or flexible sigmoidoscopy) you may notice spotting of blood in your stool or on the toilet paper. If you underwent a bowel prep for your procedure, you may not have a normal bowel movement for a few days.  Please Note:  You might notice some irritation and congestion in your nose or some drainage.  This is from the oxygen used during your procedure.  There is no need for concern and it should clear up in a day or so.  SYMPTOMS TO REPORT IMMEDIATELY:  Following upper endoscopy (EGD)  Vomiting of blood or coffee ground material  New chest pain or pain under the shoulder blades  Painful or persistently difficult swallowing  New shortness of breath  Fever of 100F or higher  Black, tarry-looking stools  For urgent or emergent issues, a gastroenterologist can be reached at any hour by calling [phone]. Do not use MyChart messaging for urgent concerns.    DIET:  We do recommend a small meal at first, but then you may proceed to your regular diet.  Drink plenty of fluids but you should avoid alcoholic  beverages for 24 hours.  ACTIVITY:  You should plan to take it easy for the rest of today and you should NOT DRIVE or use heavy machinery until tomorrow (because of the sedation medicines used during the test).    FOLLOW UP: Our staff will call the number listed on your records the next business day following your procedure.  We will call around 7:15- 8:00 am to check on you and address any questions or concerns that you may have regarding the information given to you following your procedure. If we do not reach you, we will leave a message.     If any biopsies were taken you will be contacted by phone or by letter within the next 1-3 weeks.  Please call us  at [phone] if you have not heard about the biopsies in 3 weeks.    SIGNATURES/CONFIDENTIALITY: You and/or your care partner have signed paperwork which will be entered into your electronic medical record.  These signatures attest to the fact that that the information above on your After Visit Summary has been reviewed and is understood.  Full responsibility of the confidentiality of this discharge information lies with you and/or your care-partner.

## 2023-09-11 NOTE — Op Note (Signed)
 [facility] Patient Name: Cheryl Taylor Procedure Date: 09/11/2023 10:34 AM MRN: [id number] Endoscopist: Norleen SAILOR. Abran , MD, [id number] Age: 58 Referring MD:  Date of Birth: 1965/05/15 Gender: Female Account #: [id number] Procedure:                Upper GI endoscopy Indications:              Early satiety, Nausea, Weight loss Medicines:                Monitored Anesthesia Care Procedure:                Pre-Anesthesia Assessment:                           - Prior to the procedure, a History and Physical                            was performed, and patient medications and                            allergies were reviewed. The patient's tolerance of                            previous anesthesia was also reviewed. The risks                            and benefits of the procedure and the sedation                            options and risks were discussed with the patient.                            All questions were answered, and informed consent                            was obtained. Prior Anticoagulants: The patient has                            taken no anticoagulant or antiplatelet agents. ASA                            Grade Assessment: II - A patient with mild systemic                            disease. After reviewing the risks and benefits,                            the patient was deemed in satisfactory condition to                            undergo the procedure.                           After obtaining informed consent, the endoscope was  passed under direct vision. Throughout the                            procedure, the patient's blood pressure, pulse, and                            oxygen saturations were monitored continuously. The                            GIF [id number] #[id number] was introduced through the                            mouth, and advanced to the second part of duodenum.                            The upper GI  endoscopy was accomplished without                            difficulty. The patient tolerated the procedure                            well. Scope In: Scope Out: Findings:                 The esophagus was normal.                           The stomach was normal.                           The examined duodenum was normal.                           The cardia and gastric fundus were normal on                            retroflexion. Complications:            No immediate complications. Estimated Blood Loss:     Estimated blood loss: none. Impression:               - Normal esophagus.                           - Normal stomach.                           - Normal examined duodenum.                           - No specimens collected. Recommendation:           1. Patient has a contact number available for                            emergencies. The signs and symptoms of potential  delayed complications were discussed with the                            patient. Return to normal activities tomorrow.                            Written discharge instructions were provided to the                            patient.                           2. Resume previous diet.                           3. Continue present medications.                           4. SCHEDULE SOLID PHASE GASTRIC EMPTYING SCAN                            nausea, early satiety, weight loss                           - Consider advanced imaging if gastric emptying                            scan unremarkable Legacy Lacivita N. Abran, MD 09/11/2023 10:57:29 AM This report has been signed electronically.

## 2023-09-11 NOTE — Progress Notes (Signed)
 Sedate, gd SR, tolerated procedure well, VSS, report to RN

## 2023-09-12 ENCOUNTER — Telehealth: Payer: Self-pay | Admitting: *Deleted

## 2023-09-12 NOTE — Telephone Encounter (Signed)
 No answer on follow up call. Left message.

## 2023-09-14 ENCOUNTER — Other Ambulatory Visit

## 2023-09-14 ENCOUNTER — Telehealth: Payer: Self-pay

## 2023-09-14 DIAGNOSIS — R634 Abnormal weight loss: Secondary | ICD-10-CM

## 2023-09-14 DIAGNOSIS — R110 Nausea: Secondary | ICD-10-CM

## 2023-09-14 NOTE — Progress Notes (Unsigned)
 [name]

## 2023-09-14 NOTE — Telephone Encounter (Signed)
 Order for GES placed;  message sent to [facility] and April to schedule it

## 2023-09-18 ENCOUNTER — Ambulatory Visit: Admitting: Internal Medicine

## 2023-09-18 ENCOUNTER — Ambulatory Visit (INDEPENDENT_AMBULATORY_CARE_PROVIDER_SITE_OTHER): Admitting: Neurology

## 2023-09-18 ENCOUNTER — Encounter: Payer: Self-pay | Admitting: Neurology

## 2023-09-18 VITALS — BP 140/81 | HR 67 | Ht 64.0 in | Wt 170.0 lb

## 2023-09-18 DIAGNOSIS — G320 Subacute combined degeneration of spinal cord in diseases classified elsewhere: Secondary | ICD-10-CM

## 2023-09-18 DIAGNOSIS — E538 Deficiency of other specified B group vitamins: Secondary | ICD-10-CM

## 2023-09-18 DIAGNOSIS — G4710 Hypersomnia, unspecified: Secondary | ICD-10-CM | POA: Diagnosis not present

## 2023-09-18 DIAGNOSIS — G4733 Obstructive sleep apnea (adult) (pediatric): Secondary | ICD-10-CM

## 2023-09-18 DIAGNOSIS — M4802 Spinal stenosis, cervical region: Secondary | ICD-10-CM

## 2023-09-18 NOTE — Progress Notes (Addendum)
 Provider:  Dedra Gores, MD  Primary Care Physician:  Joshua Debby CROME, MD [address] [place] [address]     Referring Provider: Joshua Debby CROME, Md [address] [place],  [place] [address]          Chief Complaint according to patient   Patient presents with:                HISTORY OF PRESENT ILLNESS:  Cheryl Taylor is a 58 y.o. female patient who is here for OSA on CPAP revisit on 09/18/2023 for yearly follow up , originally scheduled with NP.   Chief concern  : I have the pleasure of seeing Cheryl Taylor, today, a patient with a remote history of alcohol  abuse  who tested positive for OSA in a HST and meanwhile started using CPAP- AHI was now well controlled, yet hypersomnia persisted.  Home sleep test had been ordered for excessive sleepiness. She had been prescribed Armodafinil    Med. history of low back pain, chest pain, she had been depressed in the past, had GERD , gastroparesis,  she still has tremors,  has a history of Raynaud's,  Neuropathy, Cervical Radiculopathy, Anemia, low vitamin D .     Severe apnea confirmed and CPAP at 5-15 cm water, 2 cm EPR was prescribed ( continued care) . Mask is a nasal cradle type and she likes it.   She was asked not to sleep on her back.   AASM based AHI : 36.1 /h , severe - OSA , no central events.  Continue with CPAP.      Review of Systems: Out of a complete 14 system review, the patient complains of only the following symptoms, and all other reviewed systems are negative.:   SLEEPINESS ?  How likely are you to doze in the following situations: 0 = not likely, 1 = slight chance, 2 = moderate chance, 3 = high chance  Sitting and Reading? Watching Television? Sitting inactive in a public place (theater or meeting)? Lying down in the afternoon when circumstances permit? Sitting and talking to someone? Sitting quietly after lunch without alcohol ? In a car, while stopped for a few minutes in  traffic? As a passenger in a car for an hour without a break?  Total = 14/24  FSS ; 62/ 63  GDS 2/ 15        Social History   Socioeconomic History   Marital status: Married    Spouse name: Joe   Number of children: 1   Years of education: Not on file   Highest education level: Bachelor's degree (e.g., BA, AB, BS)  Occupational History   Occupation: Best boy     Comment: [employer]  Tobacco Use   Smoking status: Never   Smokeless tobacco: Never  Vaping Use   Vaping status: Never Used  Substance and Sexual Activity   Alcohol  use: No    Alcohol /week: 50.0 standard drinks of alcohol     Types: 50 Glasses of wine per week    Comment: quit ETOH on 2013   Drug use: Yes    Types: Amphetamines    Comment: Ativan  4-5mg  daily, no longer taking   Sexual activity: Yes    Partners: Male    Birth control/protection: Pill    Comment: lives with husband and daughter, works at [employer], no dietary restrictions  Other Topics Concern   Not on file  Social History Narrative  Lives wih husband and daughter   Right handed   Caffeine: 1-2 cups a day   Social Drivers of Health   Financial Resource Strain: Low Risk  (03/20/2023)   Overall Financial Resource Strain (CARDIA)    Difficulty of Paying Living Expenses: Not hard at all  Food Insecurity: No Food Insecurity (03/20/2023)   Hunger Vital Sign    Worried About Running Out of Food in the Last Year: Never true    Ran Out of Food in the Last Year: Never true  Transportation Needs: No Transportation Needs (03/20/2023)   PRAPARE - Administrator, Civil Service (Medical): No    Lack of Transportation (Non-Medical): No  Physical Activity: Unknown (03/20/2023)   Exercise Vital Sign    Days of Exercise per Week: 0 days    Minutes of Exercise per Session: Not on file  Stress: Stress Concern Present (03/20/2023)   [employer] of Occupational Health - Occupational Stress Questionnaire    Feeling of Stress  : To some extent  Social Connections: Moderately Integrated (03/20/2023)   Social Connection and Isolation Panel    Frequency of Communication with Friends and Family: More than three times a week    Frequency of Social Gatherings with Friends and Family: Three times a week    Attends Religious Services: Never    Active Member of Clubs or Organizations: Yes    Attends Engineer, structural: More than 4 times per year    Marital Status: Married    Family History  Problem Relation Age of Onset   Kidney failure Father 84       deceased   Kidney disease Father    Obesity Father    Seizures Father    Heart disease Father 51   Hypertension Father    Hyperlipidemia Father    Stroke Father    Sleep apnea Father    Schizophrenia Brother        52yo   Alcohol  abuse Brother    Other Brother        chronic pain, 25 yo   Stroke Brother    Alcohol  abuse Sister        45yo   Obesity Paternal Grandfather    Stroke Sister    Depression Mother    Anxiety disorder Mother    Obesity Mother    Colon cancer Neg Hx    Colon polyps Neg Hx    Esophageal cancer Neg Hx    Rectal cancer Neg Hx    Stomach cancer Neg Hx     Past Medical History:  Diagnosis Date   Alcohol  abuse, in remission    Anger    Anxiety    Arthritis    feet    Back pain    Cataract    Chest pain    Deficiency anemia 07/31/2019   Depression    Drug use    Fatigue 02/15/2015   GERD (gastroesophageal reflux disease)    Heart murmur    HTN (hypertension)    Hyperlipidemia    Obesity 08/10/2014   Obesity    Pain in both feet    arthritis / Hallox Rigidus   Perimenopausal 11/18/2013   Raynaud's disease    Screening for cervical cancer 02/06/2015   Sleep apnea    wears cpap    SOB (shortness of breath)    Vitamin D  deficiency     Past Surgical History:  Procedure Laterality Date   CESAREAN SECTION  2003  TONSILLECTOMY  1980     Current Outpatient Medications on File Prior to Visit   Medication Sig Dispense Refill   amLODipine  (NORVASC ) 10 MG tablet Take 1 tablet (10 mg total) by mouth daily. 90 tablet 0   Armodafinil  250 MG tablet PA DENIED TAKE 1 TABLET BY MOUTH EVERY DAY 30 tablet 2   carvedilol  (COREG ) 6.25 MG tablet TAKE 1 TABLET BY MOUTH 2 TIMES DAILY WITH A MEAL. 180 tablet 0   Cholecalciferol (VITAMIN D3) 50 MCG (2000 UT) capsule Take 2,000 Units by mouth daily.     cyanocobalamin  (VITAMIN B12) 1000 MCG/ML injection INJECT 1 ACT AS DIRECTED EVERY 30 (THIRTY) DAYS. 3 mL 1   escitalopram  (LEXAPRO ) 20 MG tablet TAKE 1 TABLET BY MOUTH EVERY DAY 90 tablet 1   hydrOXYzine  (ATARAX ) 10 MG tablet TAKE 1 TABLET BY MOUTH EVERY 8 HOURS AS NEEDED 270 tablet 0   losartan  (COZAAR ) 25 MG tablet TAKE 1 TABLET BY MOUTH EVERY DAY IN THE MORNING 90 tablet 1   MULTIPLE VITAMIN PO Take by mouth.     omeprazole  (PRILOSEC) 20 MG capsule TAKE 1 CAPSULE BY MOUTH EVERY DAY 90 capsule 1   spironolactone  (ALDACTONE ) 25 MG tablet TAKE 1 TABLET (25 MG TOTAL) BY MOUTH DAILY. 90 tablet 1   torsemide  (DEMADEX ) 20 MG tablet Take 1 tablet (20 mg total) by mouth daily. 90 tablet 3   traZODone  (DESYREL ) 50 MG tablet TAKE 2 TABLETS (100 MG TOTAL) BY MOUTH AS NEEDED. 180 tablet 1   valACYclovir  (VALTREX ) 1000 MG tablet Take 1 tablet by mouth daily (Patient taking differently: Take 1,000 mg by mouth as needed.) 90 tablet 0   No current facility-administered medications on file prior to visit.    No Known Allergies   DIAGNOSTIC DATA (LABS, IMAGING, TESTING) - I reviewed patient records, labs, notes, testing and imaging myself where available.  Lab Results  Component Value Date   WBC 6.6 06/30/2023   HGB 10.6 (L) 06/30/2023   HCT 31.7 (L) 06/30/2023   MCV 90.1 06/30/2023   PLT 377 06/30/2023      Component Value Date/Time   NA 140 09/08/2023 1608   K 3.9 09/08/2023 1608   CL 95 (L) 09/08/2023 1608   CO2 25 09/08/2023 1608   GLUCOSE 96 09/08/2023 1608   GLUCOSE 87 04/19/2023 1028   BUN 22  09/08/2023 1608   CREATININE 0.80 09/08/2023 1608   CREATININE 0.55 04/19/2023 1028   CREATININE 0.70 11/07/2012 1009   CALCIUM  9.6 09/08/2023 1608   PROT 7.1 04/19/2023 1028   PROT 7.0 12/08/2022 1042   ALBUMIN 4.5 04/19/2023 1028   ALBUMIN 4.5 12/08/2022 1042   AST 23 04/19/2023 1028   ALT 28 04/19/2023 1028   ALKPHOS 69 04/19/2023 1028   BILITOT 0.2 04/19/2023 1028   GFRNONAA >60 04/19/2023 1028   GFRAA 112 08/29/2016 1110   Lab Results  Component Value Date   CHOL 242 (H) 09/21/2022   HDL 70.70 09/21/2022   LDLCALC 152 (H) 09/21/2022   LDLDIRECT 149.0 07/30/2019   TRIG 96.0 09/21/2022   CHOLHDL 3 09/21/2022   Lab Results  Component Value Date   HGBA1C 5.9 12/08/2020   Lab Results  Component Value Date   VITAMINB12 1,043 (H) 06/30/2023   Lab Results  Component Value Date   TSH 1.61 12/21/2022    PHYSICAL EXAM:  Vitals:   09/18/23 1538  BP: (!) 140/81  Pulse: 67   No data found. Body mass index is 29.18  kg/m.   Wt Readings from Last 3 Encounters:  09/18/23 170 lb (77.1 kg)  09/11/23 170 lb (77.1 kg)  09/08/23 166 lb (75.3 kg)     Ht Readings from Last 3 Encounters:  09/18/23 5' 4 (1.626 m)  09/11/23 5' (1.524 m)  09/08/23 5' 4 (1.626 m)      General: The patient is awake, alert and appears not in acute distress and groomed. Head: Normocephalic, atraumatic.  Neck is supple. Mallampati 3,  neck circumference:16 inches . Nasal airflow not fully  patent.  Retrognathia is seen.  Dental status:  biological  Cardiovascular:  Regular rate ,  systolic murmur.  without distended neck veins. Respiratory: Lungs are clear to auscultation.  Skin:  With evidence of ankle edema. Trunk: The patient's posture is erect.   NEUROLOGIC EXAM: The patient is awake and alert, oriented to place and time.   Memory subjective described as intact.  Attention span & concentration ability appears normal.  Speech is fluent,  without  dysarthria, dysphonia or aphasia.   Mood and affect are appropriate.   Cranial nerves: no loss of smell or taste reported  Pupils are equal and briskly reactive to light. Funduscopic exam deferred. .  Extraocular movements in vertical and horizontal planes were intact and without nystagmus. No Diplopia. Visual fields by finger perimetry are intact. Hearing was intact to soft voice and finger rubbing.    Facial sensation intact to fine touch.  Facial motor strength is symmetric and tongue and uvula move midline.  There is titubation , exacerbated by eye closure.  Neck ROM : rotation, tilt and flexion extension were normal for age and shoulder shrug was symmetrical.    Motor exam:  Symmetric bulk, tone and ROM.   Normal tone without cog -wheeling, symmetric grip strength .   ASSESSMENT AND PLAN :   57 y.o. year old female  here with:  Excessive sleepiness, persistent daytime sleepiness while AHI controlled on CPAP - still reports EDS with the  ESS score  at 14 points.     1) OSA : New CPAP machine received and used compliantly  - works well,   she reports fatigue is still present but also struggles with iron def anemia now.   2) We have scheduled a PSG in lab study while on CPAP of 10 cm water  and will follow with MSLT for narcolepsy follow up. This study has been scheduled for early September   3) narcolepsy/ idiopathic hypersomnia?  There was no sleep paralysis reported but hands feel numb- That's different.  But there are some narcolepsy associated symptoms -Vivid dreams, crazy and frequent, very realistic. 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    NOTE TO RTPSG:  for 10-08-2023   PSG and MSLT were ordered  06-19-2023  and amended today with the following new instruction:  Cheryl Taylor is doing well on her CPAP, but has a 95% pressure of 9 cm water , I like the upcoming PSG on CPAP  before MSLT to have a setting at 9-10 cm water . I originally ordered a titration from 7 cm water on. That's  not needed.  I would like starting at 9 cm.  Proceed to MSLT with any AHI <10/h. CMS criteria.         I would like to thank Joshua Debby CROME, Md [address] [place],  [place] [address] for allowing me to meet with this pleasant patient.   Sleep Clinic  Patients are generally offered input on sleep hygiene, life style changes and how to improve compliance with medical treatment where applicable. Review and reiteration of good sleep hygiene measures is offered to any sleep clinic patient, be it in the first consultation or with any follow up visits. Any patient with sleepiness should be cautioned not to drive, work at heights, or operate dangerous or heavy equipment when feeling tired or sleepy.  The patient will be seen in follow-up in the sleep clinic at [facility] for discussion of positive / diagnostic test results, sleep related symptoms and treatment compliance review, further management strategies, etc.  The referring provider will be notified of the test results.   The patient's condition requires frequent monitoring and adjustments in the treatment plan, reflecting the ongoing complexity of care.  This provider is the continuing focal point for all needed services for this condition.  After spending a total time of  35  minutes face to face and time for  history taking, physical and neurologic examination, review of laboratory studies,  personal review of imaging studies, reports and results of other testing and review of referral information / records as far as provided in visit,   Electronically signed by: Dedra Gores, MD 09/18/2023 3:56 PM  [facility] and [facility] Sleep Board certified by The [employer] of Sleep Medicine and Diplomate of the [employer] of Sleep Medicine. Board certified In Neurology through the ABPN, Fellow of the [employer] of Neurology.

## 2023-09-18 NOTE — Patient Instructions (Signed)
 Excessive sleepiness, persistent daytime sleepiness with AHI control on CPAP  : ESS at 14 points.     1) New CPAP machine and works well,   she reports fatigue still being present but struggles with iron def anemia now.   2) We have scheduled a PSG on CPAP of 10 cm water  and will follow with MSLT for narcolepsy follow up.   3) no sleep paralysis, but hands feel numb- That's different.  But there are some narcolepsy associated symptoms -Vivid dreams, crazy and frequent, very realistic. Cataplexy has not been felt.   4) narcolepsy HLA, will order new.

## 2023-09-19 ENCOUNTER — Encounter (INDEPENDENT_AMBULATORY_CARE_PROVIDER_SITE_OTHER): Payer: Self-pay

## 2023-09-21 NOTE — Progress Notes (Unsigned)
 Referring Physician:  Joshua Debby CROME, MD [address] [place],  [place] [address]  Primary Physician:  Joshua Debby CROME, MD  History of Present Illness: 09/26/2023 Cheryl Taylor is here today with a chief complaint of neck pain as well as numbness, tingling, and weakness in her right arm.  She describes a burning sensation in her neck.  Numbness started in her right hand only but now is migrating up towards her neck per the patient, numbness is primarily in the first 3 digits.  She does feel some weakness in her right arm and she feels as though she is dropping things often and her grip has changed.  She describes the pain in her hand as burning in nature.  Driving and writing exacerbates his pain.  In addition she feels as though her gait has become worse and she is falling some and swaying when she walks.  In addition she has some pain in the back of the left side of her head.  This is burning in nature and will hurt to touch.  No saddle anesthesia or incontinence of bowel or bladder.    Weakness: none  Bowel/Bladder Dysfunction: none  Conservative measures:  Physical therapy:  has not participated in Multimodal medical therapy including regular antiinflammatories:  Ibuprofen  Injections:  no epidural steroid injections  Past Surgery: no spine surgeries   The symptoms are causing a significant impact on the patient's life.   Review of Systems:  A 10 point review of systems is negative, except for the pertinent positives and negatives detailed in the HPI.  Past Medical History: Past Medical History:  Diagnosis Date   Alcohol  abuse, in remission    Anger    Anxiety    Arthritis    feet    Back pain    Cataract    Chest pain    Deficiency anemia 07/31/2019   Depression    Drug use    Fatigue 02/15/2015   GERD (gastroesophageal reflux disease)    Heart murmur    HTN (hypertension)    Hyperlipidemia    Obesity 08/10/2014   Obesity    Pain in both feet     arthritis / Hallox Rigidus   Perimenopausal 11/18/2013   Raynaud's disease    Screening for cervical cancer 02/06/2015   Sleep apnea    wears cpap    SOB (shortness of breath)    Vitamin D  deficiency     Past Surgical History: Past Surgical History:  Procedure Laterality Date   CESAREAN SECTION  2003   TONSILLECTOMY  1980    Allergies: Allergies as of 09/26/2023   (No Known Allergies)    Medications: Outpatient Encounter Medications as of 09/26/2023  Medication Sig   amLODipine  (NORVASC ) 10 MG tablet Take 1 tablet (10 mg total) by mouth daily.   Armodafinil  250 MG tablet PA DENIED TAKE 1 TABLET BY MOUTH EVERY DAY   carvedilol  (COREG ) 6.25 MG tablet TAKE 1 TABLET BY MOUTH 2 TIMES DAILY WITH A MEAL.   Cholecalciferol (VITAMIN D3) 50 MCG (2000 UT) capsule Take 2,000 Units by mouth daily.   cyanocobalamin  (VITAMIN B12) 1000 MCG/ML injection INJECT 1 VIAL AS DIRECTED ONCE A WEEK.   escitalopram  (LEXAPRO ) 20 MG tablet TAKE 1 TABLET BY MOUTH EVERY DAY   hydrOXYzine  (ATARAX ) 10 MG tablet TAKE 1 TABLET BY MOUTH EVERY 8 HOURS AS NEEDED   losartan  (COZAAR ) 25 MG tablet TAKE 1 TABLET BY MOUTH EVERY DAY IN THE MORNING  MULTIPLE VITAMIN PO Take by mouth.   omeprazole  (PRILOSEC) 20 MG capsule TAKE 1 CAPSULE BY MOUTH EVERY DAY   spironolactone  (ALDACTONE ) 25 MG tablet TAKE 1 TABLET (25 MG TOTAL) BY MOUTH DAILY.   torsemide  (DEMADEX ) 20 MG tablet Take 1 tablet (20 mg total) by mouth daily.   traZODone  (DESYREL ) 50 MG tablet TAKE 2 TABLETS (100 MG TOTAL) BY MOUTH AS NEEDED.   valACYclovir  (VALTREX ) 1000 MG tablet Take 1 tablet by mouth daily (Patient taking differently: Take 1,000 mg by mouth as needed.)   [DISCONTINUED] cyanocobalamin  (VITAMIN B12) 1000 MCG/ML injection INJECT 1 ACT AS DIRECTED EVERY 30 (THIRTY) DAYS.   No facility-administered encounter medications on file as of 09/26/2023.    Social History: Social History   Tobacco Use   Smoking status: Never   Smokeless tobacco:  Never  Vaping Use   Vaping status: Never Used  Substance Use Topics   Alcohol  use: No    Alcohol /week: 50.0 standard drinks of alcohol     Types: 50 Glasses of wine per week    Comment: quit ETOH on 2013   Drug use: Yes    Types: Amphetamines    Comment: Ativan  4-5mg  daily, no longer taking    Family Medical History: Family History  Problem Relation Age of Onset   Kidney failure Father 24       deceased   Kidney disease Father    Obesity Father    Seizures Father    Heart disease Father 106   Hypertension Father    Hyperlipidemia Father    Stroke Father    Sleep apnea Father    Schizophrenia Brother        52yo   Alcohol  abuse Brother    Other Brother        chronic pain, 48 yo   Stroke Brother    Alcohol  abuse Sister        60yo   Obesity Paternal Grandfather    Stroke Sister    Depression Mother    Anxiety disorder Mother    Obesity Mother    Colon cancer Neg Hx    Colon polyps Neg Hx    Esophageal cancer Neg Hx    Rectal cancer Neg Hx    Stomach cancer Neg Hx     Physical Examination: @VITALWITHPAIN @  General: Patient is well developed, well nourished, calm, collected, and in no apparent distress. Attention to examination is appropriate.  Psychiatric: Patient is non-anxious.  Head:  Pupils equal, round, and reactive to light.  ENT:  Oral mucosa appears well hydrated.  Neck:   Supple.    Respiratory: Patient is breathing without any difficulty.  Extremities: No edema.  Vascular: Palpable dorsal pedal pulses.  Skin:   On exposed skin, there are no abnormal skin lesions.  NEUROLOGICAL:     Awake, alert, oriented to person, place, and time.  Speech is clear and fluent. Fund of knowledge is appropriate.   Cranial Nerves: Pupils equal round and reactive to light.  Facial tone is symmetric.  Facial sensation is symmetric.  ROM of spine: Tenderness to palpation of cervical paraspinals.  + tinel of left occipital nerve Positive provocative maneuvers  of right carpal tunnel syndrome. Positive Spurling's.  Strength: Side Biceps Triceps Deltoid Interossei Grip Wrist Ext. Wrist Flex.  R 5 5 5 4  4+ 5 5  L 5 5 5 5 5 5 5     Reflexes are 2+ and symmetric at the biceps, triceps, brachioradialis, patella and achilles.  Hoffman's is absent.  Clonus is not present.  Toes are down-going.  Bilateral upper and lower extremity sensation is intact to light touch.    Slight difficulty with tandem gait.  Medical Decision Making  Imaging: CLINICAL DATA:  Neck pain with right upper extremity numbness in tingling a few months.   EXAM: CERVICAL SPINE - COMPLETE 4+ VIEW   COMPARISON:  None Available.   FINDINGS: Vertebral body alignment and heights are normal. There is mild spondylosis throughout the cervical spine most notable over the mid to lower spine. Mild disc space narrowing at the C5-6 level. Moderate left-sided neural foraminal narrowing at the C4-5 level and moderate right-sided neural foraminal narrowing at the C5-6 level. Minimal left-sided neural foraminal narrowing at the C5-6 level. Atlantoaxial articulation is normal. Prevertebral soft tissues are normal. There is uncovertebral joint spurring and facet arthropathy present.   IMPRESSION: 1. 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 2. Moderate left-sided neural foraminal narrowing at the C4-5 level and moderate right-sided neural foraminal narrowing at the C5-6 level.  I have personally reviewed the images and agree with the above interpretation.  Assessment and Plan: Ms. Hogland is a pleasant 58 y.o. female with right hand numbness and tingling, primarily in the first 3 digits that exacerbated by writing and driving that is now progressed up her arm.  It wakes her up from sleep in middle the night.  She also has some burning neck pain that goes towards her right arm as well as up the back of the left side of her head.  She feels as though her  walking has become worse and her fine motor skills are suffering.  She is dropping things often and feels as though her gait has been altered.  Positive provocative maneuvers indicating carpal tunnel syndrome on the right upper extremity.  Positive Tinel of left occipital nerve.  Positive Spurling's.  Slight intrinsic hand weakness on examination.    Concern for both right carpal tunnel syndrome as well as left occipital neuralgia in addition to cervical radiculopathy.  Plan includes the following moving forward:  -Dynamic films of cervical spine including extension and flexion - MRI of cervical spine to evaluate further for cervical stenosis - EMG of right upper extremity given symptoms that are exacerbated by writing and driving that wake her up from night.  Would like to rule out carpal tunnel syndrome versus radiculopathy. - Referral placed for physical therapy. - Gabapentin  given for neuropathic pain. - Will review results once complete.   Thank you for involving me in the care of this patient.   I spent a total of 45 minutes in both face-to-face and non-face-to-face activities for this visit on the date of this encounter including preparing to see the patient, obtaining and reviewing separately obtained history, performing medically appropriate examination, counseling the patient and their family, ordering additional medications and tests, documenting clinical information, independently interpreting results, coordination of care.   Lyle Decamp, PA-C Dept. of Neurosurgery

## 2023-09-25 ENCOUNTER — Other Ambulatory Visit: Payer: Self-pay | Admitting: Internal Medicine

## 2023-09-25 DIAGNOSIS — E538 Deficiency of other specified B group vitamins: Secondary | ICD-10-CM

## 2023-09-25 NOTE — Addendum Note (Signed)
 Addended by: CHALICE SAUNAS on: 09/25/2023 08:05 AM   Modules accepted: Orders

## 2023-09-26 ENCOUNTER — Encounter: Payer: Self-pay | Admitting: Physician Assistant

## 2023-09-26 ENCOUNTER — Ambulatory Visit: Admitting: Physician Assistant

## 2023-09-26 VITALS — BP 128/68 | Ht 64.0 in | Wt 171.0 lb

## 2023-09-26 DIAGNOSIS — R200 Anesthesia of skin: Secondary | ICD-10-CM

## 2023-09-26 DIAGNOSIS — R208 Other disturbances of skin sensation: Secondary | ICD-10-CM

## 2023-09-26 DIAGNOSIS — R202 Paresthesia of skin: Secondary | ICD-10-CM | POA: Diagnosis not present

## 2023-09-26 DIAGNOSIS — R29898 Other symptoms and signs involving the musculoskeletal system: Secondary | ICD-10-CM

## 2023-09-26 DIAGNOSIS — M5412 Radiculopathy, cervical region: Secondary | ICD-10-CM

## 2023-09-26 MED ORDER — GABAPENTIN 300 MG PO CAPS: 300.0000 mg | ORAL_CAPSULE | Freq: Every day | ORAL | 2 refills | Status: AC

## 2023-09-28 ENCOUNTER — Ambulatory Visit: Payer: Self-pay | Admitting: Neurology

## 2023-09-28 LAB — NARCOLEPSY EVALUATION
DQA1*01:02: NEGATIVE
DQB1*06:02: NEGATIVE

## 2023-10-03 ENCOUNTER — Encounter: Payer: Self-pay | Admitting: Physician Assistant

## 2023-10-03 ENCOUNTER — Other Ambulatory Visit

## 2023-10-03 DIAGNOSIS — Z006 Encounter for examination for normal comparison and control in clinical research program: Secondary | ICD-10-CM

## 2023-10-05 ENCOUNTER — Other Ambulatory Visit: Payer: Self-pay | Admitting: Internal Medicine

## 2023-10-05 DIAGNOSIS — I10 Essential (primary) hypertension: Secondary | ICD-10-CM

## 2023-10-05 NOTE — Addendum Note (Signed)
 Addended by: GIRARD DON GAILS on: 10/05/2023 05:06 PM   Modules accepted: Orders

## 2023-10-07 ENCOUNTER — Ambulatory Visit
Admission: RE | Admit: 2023-10-07 | Discharge: 2023-10-07 | Disposition: A | Discharge status: Home / Self Care | Source: Ambulatory Visit | Attending: Physician Assistant | Admitting: Physician Assistant

## 2023-10-07 DIAGNOSIS — M5412 Radiculopathy, cervical region: Secondary | ICD-10-CM

## 2023-10-08 ENCOUNTER — Ambulatory Visit (INDEPENDENT_AMBULATORY_CARE_PROVIDER_SITE_OTHER): Admitting: Neurology

## 2023-10-08 DIAGNOSIS — G4733 Obstructive sleep apnea (adult) (pediatric): Secondary | ICD-10-CM

## 2023-10-08 DIAGNOSIS — M4802 Spinal stenosis, cervical region: Secondary | ICD-10-CM

## 2023-10-08 DIAGNOSIS — G4710 Hypersomnia, unspecified: Secondary | ICD-10-CM

## 2023-10-08 DIAGNOSIS — E538 Deficiency of other specified B group vitamins: Secondary | ICD-10-CM

## 2023-10-08 DIAGNOSIS — G4719 Other hypersomnia: Secondary | ICD-10-CM

## 2023-10-09 ENCOUNTER — Other Ambulatory Visit: Payer: Self-pay | Admitting: Neurology

## 2023-10-09 ENCOUNTER — Other Ambulatory Visit (INDEPENDENT_AMBULATORY_CARE_PROVIDER_SITE_OTHER): Payer: Self-pay

## 2023-10-09 ENCOUNTER — Ambulatory Visit: Admitting: Neurology

## 2023-10-09 DIAGNOSIS — G4733 Obstructive sleep apnea (adult) (pediatric): Secondary | ICD-10-CM

## 2023-10-09 DIAGNOSIS — I119 Hypertensive heart disease without heart failure: Secondary | ICD-10-CM

## 2023-10-09 DIAGNOSIS — G4710 Hypersomnia, unspecified: Secondary | ICD-10-CM

## 2023-10-09 DIAGNOSIS — Z79899 Other long term (current) drug therapy: Secondary | ICD-10-CM

## 2023-10-09 DIAGNOSIS — G4711 Idiopathic hypersomnia with long sleep time: Secondary | ICD-10-CM | POA: Diagnosis not present

## 2023-10-09 DIAGNOSIS — Z0289 Encounter for other administrative examinations: Secondary | ICD-10-CM

## 2023-10-09 DIAGNOSIS — G252 Other specified forms of tremor: Secondary | ICD-10-CM

## 2023-10-12 ENCOUNTER — Ambulatory Visit: Payer: Self-pay | Admitting: Physician Assistant

## 2023-10-13 ENCOUNTER — Ambulatory Visit: Payer: Self-pay | Admitting: Neurology

## 2023-10-13 LAB — GENECONNECT MOLECULAR SCREEN: Genetic Analysis Overall Interpretation: NEGATIVE

## 2023-10-13 LAB — COMPREHENSIVE DRUG ANALYSIS,UR

## 2023-10-14 ENCOUNTER — Other Ambulatory Visit: Payer: Self-pay | Admitting: Internal Medicine

## 2023-10-14 DIAGNOSIS — F411 Generalized anxiety disorder: Secondary | ICD-10-CM

## 2023-10-17 ENCOUNTER — Other Ambulatory Visit: Payer: Self-pay | Admitting: Internal Medicine

## 2023-10-17 DIAGNOSIS — G4733 Obstructive sleep apnea (adult) (pediatric): Secondary | ICD-10-CM

## 2023-10-17 DIAGNOSIS — G4710 Hypersomnia, unspecified: Secondary | ICD-10-CM

## 2023-10-19 ENCOUNTER — Ambulatory Visit: Payer: Self-pay | Admitting: Neurology

## 2023-10-19 NOTE — Procedures (Signed)
 [facility] MSLT Report    Name: Cheryl Taylor, Cheryl Taylor Reference [id number]  Study Date: 10/09/2023 FMW:[id number] DOB: April 27, 1965  Protocol This is a 13 channel Multiple Sleep Latency Test comprised of 5 channels of EEG (T3-Cz, Cz-T4, F4-M1, C4-M1, O2-M1). 3 channels of Chin EMG, 4 channels of EOG and 1 channel for ECG. All channels were sampled at 256 Hz.  This polysomnographic procedure is designed to evaluate (1) the complaint of excessive daytime sleepiness by quantifying the time required to fall asleep and (2) the possibility of narcolepsy by checking for abnormally short latencies to REM sleep. Electrographic variables include EEG, EMG, EOG and ECG. Patients are monitored throughout four or five 20-minute opportunities to sleep (naps) at two-hour intervals. For each nap, the patient is allowed 20 minutes to fall asleep. Once asleep, the patient is awakened after 15 minutes. Between naps, the patient is kept as alert as possible. A sleep latency of 20 minutes indicates that no sleep occurred. Parametric Analysis Total Number of Naps 2   NAP # Time of Nap Sleep Latency (mins) REM Latency (mins) Sleep Time Percent Awake Time Percent   1 08:32 20.0 20.0 0 100    2 10:32 20.0 20.0 0 100     MSLT Summary of Naps  Sleepiness Index: 0.0  Mean Sleep Latency: 20.0  Number of Naps with REM Sleep: 0   Results from Preceding PSG Study  Sleep Onset Time 22:38 Sleep Efficiency (%) 91.94%  Rise Time 06:22 Sleep Latency (min) 8.27min  Total Sleep Time 7h 13.41min REM Latency (min) 5h 41.57min   Name: Cheryl Taylor, Cheryl Taylor Reference [id number]  Study Date: 10/09/2023 FMW:[id number] DOB: Dec 15, 1965  IMPRESSION: No evidence of hypersomnia - no sleep onset in the first 2 nap opportunities.  RECOMMENDATIONS: None-   no Evidence of Hypersomnia  The dx of reported  persistent hypersomnia could not be verified as abnormal sleepiness, no short sleep latency and no REM sleep onset were  observed.  I attest to having reviewed every epoch of the entire raw data recording prior to the issuance of this report in accordance with the Standards of the American Academy of Sleep Medicine. DEDRA GORES, MD

## 2023-10-19 NOTE — Procedures (Signed)
 [facility] PAP TITRATION INTERPRETATION REPORT   STUDY DATE: 10/08/2023      PATIENT NAME:  Cheryl Taylor         DATE OF BIRTH:  [birth year]  PATIENT ID:  [id number]    TYPE OF STUDY:  CPAP  READING PHYSICIAN: DEDRA GORES, MD SCORING TECHNICIAN: Jesusa Haddock, RPSGT   HISTORY: This 58 year-old Female reports persistent hypersomnia after being diagnosed and treated for OSA, using CPAP compliantly . The Epworth Sleepiness Scale was endorsed at 14 /24 (scores above or equal to 10 are suggestive of hypersomnolence). FSS : x/ 63 points.   DESCRIPTION: A sleep technologist was in attendance for the duration of the recording.  Data collection, scoring, video monitoring, and reporting were performed in compliance with the AASM Manual for the Scoring of Sleep and Associated Events.  ADDITIONAL INFORMATION:  Height: 64.0 in Weight: 170 lb (BMI 29) Neck Size: 16.0 in    MEDICATIONS: Norvasc , Armodafinil , Coreg , Vitamin D3, Lexapro , Atarax , Cozaar , Multiple Vitamin, Prilosec, Aldactone , Demadex , Trazodone , Valtrex    SLEEP CONTINUITY AND SLEEP ARCHITECTURE:   CPAP was applied at 9 cm water pressure and continued throughout the night.  Lights off was at 22:24: and lights on 06:22: (8.0 hours in bed). Total sleep time was 433.5 minutes (34.9% supine;  65.1% lateral;  0.0% prone, 0.8% REM sleep), and sleep efficiency at 90.6%.  Sleep latency was 15.5 minutes.  Of the total sleep time, the percentage of stage N1 sleep was 2.4%, stage N2 sleep was 71.2%, stage N3 sleep was 25.6%, and REM sleep was 0.8%.There were 0 Stage R periods observed on this study night, 27 awakenings (i.e. transitions to Stage W from any sleep stage), and 88.0 total stage transitions. Wake after sleep onset (WASO) time accounted for 29 minutes.  AROUSAL: There were 13.4 arousals/hour.  Of these, 0 were identified as respiratory-related arousals (0.0 /h), 0 were PLM-related arousals (0.0 /h), and  97 were non-specific arousals (13.4 /h)  RESPIRATORY MONITORING:  Based on CMS criteria (using a 4% oxygen desaturation rule for scoring hypopneas), there was 1 apnea (0 obstructive; 1 central; 0 mixed), and 0 hypopneas.   Apnea index was 0.1. Hypopnea index was 0.0. The apnea-hypopnea index was 0.1 overall (0.0 supine, 0.0 non-supine; 0.0 REM, 0.0 supine REM). There were 0 respiratory effort-related arousals (RERAs).  The RERA index was 0.0 events/h. Total respiratory disturbance index (RDI) was 0.1 events/h. RDI results showed: supine RDI  0.0 /h; non-supine RDI 0.2 /h; REM RDI 0.0 /h, supine REM RDI 0.0 /h.   Based on AASM criteria (using a 3% oxygen desaturation and /or arousal rule for scoring hypopneas), there was 1 apnea (0 obstructive; 1 central; 0 mixed), and 0 hypopneas.  Apnea index was 0.1. Hypopnea index was 0.0. The apnea-hypopnea index was 0.1 overall (0.0 supine, 0.0 non-supine; 0.0 REM, 0.0 supine REM).  There were 0 respiratory effort-related arousals (RERAs). OXIMETRY: Respiratory events were associated with oxyhemoglobin desaturations (nadir during sleep 86% from a mean of 96%.  Total sleep time spent at, or below 88% was 0.1 minutes, or 0.0% of total sleep time. BODY POSITION: Duration of total sleep and percent of total sleep in their respective position is as follows: supine 151 minutes (34.9%), non-supine 282.0 minutes (65.1%); Total supine REM sleep time was 00 minutes (0.0% of total REM sleep). LIMB MOVEMENTS: There were 0 periodic limb movements of sleep (0.0/h), of which 0 (0.0/h) were associated with an arousal. EKG: The electrocardiogram documented  normal sinus rhythm and bradycardic sinus rhythm. The average heart rate during sleep was 52 bpm.  The maximum heart rate during sleep was 77 bpm, minimum heart rate was 30 bpm.   IMPRESSION:   1. On 9 cm water pressure of positive airway therapy  there was no apnea or hypopnea of clinical significance. Sleep-disordered  breathing and Hypoxemia resolved with therapy.   2. Total sleep time was within normal limits. There was one episode of bradycardia noted. No PLMs were seen.   No REM sleep onset was seen.    3. There was above average amount of sleep wave sleep present.        RECOMMENDATIONS:   Valid study for MSLT to follow.    DEDRA GORES,  MD

## 2023-10-22 ENCOUNTER — Other Ambulatory Visit: Payer: Self-pay | Admitting: Internal Medicine

## 2023-10-22 DIAGNOSIS — I10 Essential (primary) hypertension: Secondary | ICD-10-CM

## 2023-10-24 ENCOUNTER — Other Ambulatory Visit (HOSPITAL_COMMUNITY)

## 2023-10-24 ENCOUNTER — Telehealth: Payer: Self-pay | Admitting: Physician Assistant

## 2023-10-25 ENCOUNTER — Encounter: Payer: Self-pay | Admitting: Internal Medicine

## 2023-10-26 ENCOUNTER — Other Ambulatory Visit: Payer: Self-pay | Admitting: Physician Assistant

## 2023-10-26 DIAGNOSIS — D649 Anemia, unspecified: Secondary | ICD-10-CM

## 2023-10-26 NOTE — Progress Notes (Signed)
 [facility] [phone]   [phone]  PROGRESS NOTE  Patient Care Team: Joshua Debby CROME, MD as PCP - General (Internal Medicine) Croitoru, Jerel, MD as PCP - Cardiology (Cardiology) Merceda Lela SAUNDERS, [facility] (Pharmacist)   CHIEF COMPLAINTS/PURPOSE OF CONSULTATION:  Normocytic anemia  HISTORY OF PRESENTING ILLNESS:  Cheryl Taylor 58 y.o. female returns for a follow up for normocytic anemia.   On exam today, Cheryl Taylor reports she continues to have persistent fatigue that does impact her ADLs including ability to work.  Her appetite and weight are fairly stable.  She denies nausea, vomiting or abdominal pain.  She was recently diagnosed with obstructive sleep apnea and uses a CPAP machine nightly.  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  She denies easy bruising or signs of active bleeding.  She denies fevers, chills, night sweats, shortness of breath, chest pain or cough.  She has no other complaints.  Rest of the 10 point ROS as below.  MEDICAL HISTORY:  Past Medical History:  Diagnosis Date   Alcohol  abuse, in remission    Anger    Anxiety    Arthritis    feet    Back pain    Cataract    Chest pain    Deficiency anemia 07/31/2019   Depression    Drug use    Fatigue 02/15/2015   GERD (gastroesophageal reflux disease)    Heart murmur    HTN (hypertension)    Hyperlipidemia    Obesity 08/10/2014   Obesity    Pain in both feet    arthritis / Hallox Rigidus   Perimenopausal 11/18/2013   Raynaud's disease    Screening for cervical cancer 02/06/2015   Sleep apnea    wears cpap    SOB (shortness of breath)    Vitamin D  deficiency     SURGICAL HISTORY: Past Surgical History:  Procedure Laterality Date   CESAREAN SECTION  2003   TONSILLECTOMY  1980    SOCIAL HISTORY: Social History   Socioeconomic History   Marital status: Married    Spouse name:  Joe   Number of children: 1   Years of education: Not on file   Highest education level: Bachelor's degree (e.g., BA, AB, BS)  Occupational History   Occupation: Best boy     Comment: [employer]  Tobacco Use   Smoking status: Never   Smokeless tobacco: Never  Vaping Use   Vaping status: Never Used  Substance and Sexual Activity   Alcohol  use: No    Alcohol /week: 50.0 standard drinks of alcohol     Types: 50 Glasses of wine per week    Comment: quit ETOH on 2013   Drug use: Yes    Types: Amphetamines    Comment: Ativan  4-5mg  daily, no longer taking   Sexual activity: Yes    Partners: Male    Birth control/protection: Pill    Comment: lives with husband and daughter, works at [employer], no dietary restrictions  Other Topics Concern   Not on file  Social History Narrative   Lives wih husband and daughter   Right handed   Caffeine: 1-2 cups a day   Social Drivers of Health   Financial Resource Strain: Low Risk  (03/20/2023)   Overall Financial Resource Strain (CARDIA)    Difficulty of Paying Living Expenses: Not hard at all  Food Insecurity: No Food Insecurity (03/20/2023)   Hunger Vital  Sign    Worried About Programme researcher, broadcasting/film/video in the Last Year: Never true    Ran Out of Food in the Last Year: Never true  Transportation Needs: No Transportation Needs (03/20/2023)   PRAPARE - Administrator, Civil Service (Medical): No    Lack of Transportation (Non-Medical): No  Physical Activity: Unknown (03/20/2023)   Exercise Vital Sign    Days of Exercise per Week: 0 days    Minutes of Exercise per Session: Not on file  Stress: Stress Concern Present (03/20/2023)   [employer] of Occupational Health - Occupational Stress Questionnaire    Feeling of Stress : To some extent  Social Connections: Moderately Integrated (03/20/2023)   Social Connection and Isolation Panel    Frequency of Communication with Friends and Family: More than three times a week     Frequency of Social Gatherings with Friends and Family: Three times a week    Attends Religious Services: Never    Active Member of Clubs or Organizations: Yes    Attends Engineer, structural: More than 4 times per year    Marital Status: Married  Catering manager Violence: Not on file    FAMILY HISTORY: Family History  Problem Relation Age of Onset   Kidney failure Father 44       deceased   Kidney disease Father    Obesity Father    Seizures Father    Heart disease Father 35   Hypertension Father    Hyperlipidemia Father    Stroke Father    Sleep apnea Father    Schizophrenia Brother        52yo   Alcohol  abuse Brother    Other Brother        chronic pain, 63 yo   Stroke Brother    Alcohol  abuse Sister        45yo   Obesity Paternal Grandfather    Stroke Sister    Depression Mother    Anxiety disorder Mother    Obesity Mother    Colon cancer Neg Hx    Colon polyps Neg Hx    Esophageal cancer Neg Hx    Rectal cancer Neg Hx    Stomach cancer Neg Hx     ALLERGIES:  has no known allergies.  MEDICATIONS:  Current Outpatient Medications  Medication Sig Dispense Refill   amLODipine  (NORVASC ) 10 MG tablet TAKE 1 TABLET BY MOUTH EVERY DAY 90 tablet 0   Armodafinil  250 MG tablet TAKE 1 TABLET BY MOUTH EVERY DAY **PA DENIED 30 tablet 2   carvedilol  (COREG ) 6.25 MG tablet TAKE 1 TABLET BY MOUTH 2 TIMES DAILY WITH A MEAL. 180 tablet 0   Cholecalciferol (VITAMIN D3) 50 MCG (2000 UT) capsule Take 2,000 Units by mouth daily.     cyanocobalamin  (VITAMIN B12) 1000 MCG/ML injection INJECT 1 VIAL AS DIRECTED ONCE A WEEK. 12 mL 0   escitalopram  (LEXAPRO ) 20 MG tablet TAKE 1 TABLET BY MOUTH EVERY DAY 90 tablet 1   gabapentin  (NEURONTIN ) 300 MG capsule Take 1 capsule (300 mg total) by mouth at bedtime. 90 capsule 2   hydrOXYzine  (ATARAX ) 10 MG tablet TAKE 1 TABLET BY MOUTH EVERY 8 HOURS AS NEEDED 90 tablet 2   losartan  (COZAAR ) 25 MG tablet TAKE 1 TABLET BY MOUTH EVERY DAY  IN THE MORNING 90 tablet 1   MULTIPLE VITAMIN PO Take by mouth.     omeprazole  (PRILOSEC) 20 MG capsule TAKE 1 CAPSULE BY MOUTH EVERY  DAY 90 capsule 1   spironolactone  (ALDACTONE ) 25 MG tablet TAKE 1 TABLET (25 MG TOTAL) BY MOUTH DAILY. 90 tablet 1   torsemide  (DEMADEX ) 20 MG tablet Take 1 tablet (20 mg total) by mouth daily. 90 tablet 3   traZODone  (DESYREL ) 50 MG tablet TAKE 2 TABLETS (100 MG TOTAL) BY MOUTH AS NEEDED. 180 tablet 1   valACYclovir  (VALTREX ) 1000 MG tablet Take 1 tablet by mouth daily (Patient taking differently: Take 1,000 mg by mouth as needed.) 90 tablet 0   No current facility-administered medications for this visit.    REVIEW OF SYSTEMS:   Constitutional: ( - ) fevers, ( - )  chills , ( - ) night sweats Eyes: ( - ) blurriness of vision, ( - ) double vision, ( - ) watery eyes Ears, nose, mouth, throat, and face: ( - ) mucositis, ( - ) sore throat Respiratory: ( - ) cough, ( - ) dyspnea, ( - ) wheezes Cardiovascular: ( - ) palpitation, ( - ) chest discomfort, ( - ) lower extremity swelling Gastrointestinal:  ( - ) nausea, ( - ) heartburn, ( - ) change in bowel habits Skin: ( - ) abnormal skin rashes Lymphatics: ( - ) new lymphadenopathy, ( - ) easy bruising Neurological: ( - ) numbness, ( - ) tingling, ( - ) new weaknesses Behavioral/Psych: ( - ) mood change, ( - ) new changes  All other systems were reviewed with the patient and are negative.  PHYSICAL EXAMINATION: ECOG PERFORMANCE STATUS: 1 - Symptomatic but completely ambulatory  There were no vitals filed for this visit.  There were no vitals filed for this visit.   GENERAL: well appearing female in NAD  SKIN: skin color, texture, turgor are normal, no rashes or significant lesions EYES: conjunctiva are pink and non-injected, sclera clear LUNGS: clear to auscultation and percussion with normal breathing effort HEART: regular rate & rhythm and no murmurs and no lower extremity edema Musculoskeletal: no  cyanosis of digits and no clubbing  PSYCH: alert & oriented x 3, fluent speech NEURO: no focal motor/sensory deficits  LABORATORY DATA:  I have reviewed the data as listed    Latest Ref Rng & Units 06/30/2023   12:52 PM 04/19/2023   10:28 AM 03/23/2023    4:30 PM  CBC  WBC 4.0 - 10.5 K/uL 6.6  6.4  6.5   Hemoglobin 12.0 - 15.0 g/dL 89.3  89.3  88.9   Hematocrit 36.0 - 46.0 % 31.7  32.4  32.8   Platelets 150 - 400 K/uL 377  324  366.0        Latest Ref Rng & Units 09/08/2023    4:08 PM 04/19/2023   10:28 AM 03/02/2023    9:22 AM  CMP  Glucose 70 - 99 mg/dL 96  87  97   BUN 8 - 27 mg/dL 22  16  17    Creatinine 0.57 - 1.00 mg/dL 9.19  9.44  9.34   Sodium 134 - 144 mmol/L 140  136  138   Potassium 3.5 - 5.2 mmol/L 3.9  3.3  4.0   Chloride 96 - 106 mmol/L 95  97  97   CO2 20 - 29 mmol/L 25  32  26   Calcium  8.7 - 10.3 mg/dL 9.6  9.0  9.1   Total Protein 6.5 - 8.1 g/dL  7.1    Total Bilirubin 0.0 - 1.2 mg/dL  0.2    Alkaline Phos 38 - 126 U/L  69    AST 15 - 41 U/L  23    ALT 0 - 44 U/L  28      RADIOGRAPHIC STUDIES: I have personally reviewed the radiological images as listed and agreed with the findings in the report. Cpap titration Result Date: 10/19/2023 Chalice Saunas, MD     10/19/2023  5:34 PM  [facility] PAP TITRATION INTERPRETATION REPORT STUDY DATE: 10/08/2023   PATIENT NAME:  Cheryl Taylor        DATE OF BIRTH:  November 05, 1965 PATIENT ID:  [id number]    TYPE OF STUDY:  CPAP READING PHYSICIAN: SAUNAS CHALICE, MD SCORING TECHNICIAN: Jesusa Haddock, RPSGT HISTORY: This 58 year-old Female reports persistent hypersomnia after being diagnosed and treated for OSA, using CPAP compliantly . The Epworth Sleepiness Scale was endorsed at 14 /24 (scores above or equal to 10 are suggestive of hypersomnolence). FSS : x/ 63 points. DESCRIPTION: A sleep technologist was in attendance for the duration of the recording.  Data collection, scoring, video monitoring,  and reporting were performed in compliance with the AASM Manual for the Scoring of Sleep and Associated Events. ADDITIONAL INFORMATION:  Height: 64.0 in Weight: 170 lb (BMI 29) Neck Size: 16.0 in  MEDICATIONS: Norvasc , Armodafinil , Coreg , Vitamin D3, Lexapro , Atarax , Cozaar , Multiple Vitamin, Prilosec, Aldactone , Demadex , Trazodone , Valtrex  SLEEP CONTINUITY AND SLEEP ARCHITECTURE:  CPAP was applied at 9 cm water pressure and continued throughout the night. Lights off was at 22:24: and lights on 06:22: (8.0 hours in bed). Total sleep time was 433.5 minutes (34.9% supine;  65.1% lateral;  0.0% prone, 0.8% REM sleep), and sleep efficiency at 90.6%.  Sleep latency was 15.5 minutes.  Of the total sleep time, the percentage of stage N1 sleep was 2.4%, stage N2 sleep was 71.2%, stage N3 sleep was 25.6%, and REM sleep was 0.8%.There were 0 Stage R periods observed on this study night, 27 awakenings (i.e. transitions to Stage W from any sleep stage), and 88.0 total stage transitions. Wake after sleep onset (WASO) time accounted for 29 minutes. AROUSAL: There were 13.4 arousals/hour.  Of these, 0 were identified as respiratory-related arousals (0.0 /h), 0 were PLM-related arousals (0.0 /h), and 97 were non-specific arousals (13.4 /h) RESPIRATORY MONITORING:  Based on CMS criteria (using a 4% oxygen desaturation rule for scoring hypopneas), there was 1 apnea (0 obstructive; 1 central; 0 mixed), and 0 hypopneas.  Apnea index was 0.1. Hypopnea index was 0.0. The apnea-hypopnea index was 0.1 overall (0.0 supine, 0.0 non-supine; 0.0 REM, 0.0 supine REM). There were 0 respiratory effort-related arousals (RERAs).  The RERA index was 0.0 events/h. Total respiratory disturbance index (RDI) was 0.1 events/h. RDI results showed: supine RDI  0.0 /h; non-supine RDI 0.2 /h; REM RDI 0.0 /h, supine REM RDI 0.0 /h. Based on AASM criteria (using a 3% oxygen desaturation and /or arousal rule for scoring hypopneas), there was 1 apnea (0  obstructive; 1 central; 0 mixed), and 0 hypopneas. Apnea index was 0.1. Hypopnea index was 0.0. The apnea-hypopnea index was 0.1 overall (0.0 supine, 0.0 non-supine; 0.0 REM, 0.0 supine REM). There were 0 respiratory effort-related arousals (RERAs). OXIMETRY: Respiratory events were associated with oxyhemoglobin desaturations (nadir during sleep 86% from a mean of 96%.  Total sleep time spent at, or below 88% was 0.1 minutes, or 0.0% of total sleep time. BODY POSITION: Duration of total sleep and percent of total sleep in their respective position is as follows: supine 151 minutes (34.9%), non-supine 282.0 minutes (65.1%);  Total supine REM sleep time was 00 minutes (0.0% of total REM sleep). LIMB MOVEMENTS: There were 0 periodic limb movements of sleep (0.0/h), of which 0 (0.0/h) were associated with an arousal. EKG: The electrocardiogram documented normal sinus rhythm and bradycardic sinus rhythm. The average heart rate during sleep was 52 bpm.  The maximum heart rate during sleep was 77 bpm, minimum heart rate was 30 bpm. IMPRESSION: 1. On 9 cm water pressure of positive airway therapy  there was no apnea or hypopnea of clinical significance. Sleep-disordered breathing and Hypoxemia resolved with therapy. 2. Total sleep time was within normal limits. There was one episode of bradycardia noted. No PLMs were seen.  No REM sleep onset was seen.  3. There was above average amount of sleep wave sleep present.    RECOMMENDATIONS:  Valid study for MSLT to follow. CARMEN DOHMEIER,  MD    MR CERVICAL SPINE WO CONTRAST Result Date: 10/11/2023 CLINICAL DATA:  Initial evaluation for chronic neck pain with numbness and burning in right forearm/fingers, numbness at left head. EXAM: MRI CERVICAL SPINE WITHOUT CONTRAST TECHNIQUE: Multiplanar, multisequence MR imaging of the cervical spine was performed. No intravenous contrast was administered. COMPARISON:  Radiograph from 09/04/2023 FINDINGS: Alignment: Reversal of the normal  cervical lordosis with underlying mild dextroscoliosis. 2 mm anterolisthesis of C4 on C5, with 2 mm retrolisthesis of C5 on C6. Vertebrae: Vertebral body height maintained without acute or chronic fracture. Bone marrow signal intensity within normal limits. No worrisome osseous lesions. No abnormal marrow edema. Cord: Normal signal and morphology. Posterior Fossa, vertebral arteries, paraspinal tissues: Unremarkable. Disc levels: C2-C3: Normal interspace.  No canal or foraminal stenosis. C3-C4: Degenerative intervertebral disc space narrowing without significant disc bulge. Left greater than right facet arthrosis with bony ankylosis. No spinal stenosis. Mild left C4 foraminal narrowing. Right neural foramen remains patent. C4-C5: Trace anterolisthesis. Left eccentric disc bulge with left greater than right uncovertebral spurring. Moderate left facet arthrosis. No significant spinal stenosis. Severe left C5 foraminal narrowing. Right neural foramen remains patent. C5-C6: Degenerative vertebral disc space narrowing with circumferential disc osteophyte complex. Posterior component flattens and effaces the ventral thecal sac, asymmetric to the right (series 105, image 17). Mild cord flattening without cord signal changes. Mild spinal stenosis with severe bilateral C6 foraminal narrowing, worse on the right. C6-C7: Mild disc bulge. No spinal stenosis. Foramina remain patent. C7-T1: Negative interspace. Moderate left facet arthrosis. No spinal stenosis. Foramina remain patent. IMPRESSION: 1. Right eccentric disc osteophyte complex at C5-6 with resultant mild canal and severe bilateral C6 foraminal stenosis. 2. Left eccentric disc osteophyte complex and facet arthrosis at C4-5 with resultant severe left C5 foraminal stenosis. 3. Mild left C4 foraminal narrowing related to uncovertebral and facet disease. Electronically Signed   By: Morene Hoard M.D.   On: 10/11/2023 22:34   Multiple sleep latency test Result  Date: 10/09/2023 Chalice Saunas, MD     10/19/2023  5:45 PM [facility] MSLT Report  Name: Cheryl Taylor, Cheryl Taylor Reference [id number] Study Date: 10/09/2023 FMW:[id number] DOB: February 06, 1965 Protocol This is a 13 channel Multiple Sleep Latency Test comprised of 5 channels of EEG (T3-Cz, Cz-T4, F4-M1, C4-M1, O2-M1). 3 channels of Chin EMG, 4 channels of EOG and 1 channel for ECG. All channels were sampled at 256 Hz. This polysomnographic procedure is designed to evaluate (1) the complaint of excessive daytime sleepiness by quantifying the time required to fall asleep and (2) the possibility of narcolepsy by checking for abnormally short latencies to  REM sleep. Electrographic variables include EEG, EMG, EOG and ECG. Patients are monitored throughout four or five 20-minute opportunities to sleep (naps) at two-hour intervals. For each nap, the patient is allowed 20 minutes to fall asleep. Once asleep, the patient is awakened after 15 minutes. Between naps, the patient is kept as alert as possible. A sleep latency of 20 minutes indicates that no sleep occurred. Parametric Analysis Total Number of Naps 2 NAP # Time of Nap Sleep Latency (mins) REM Latency (mins) Sleep Time Percent Awake Time Percent 1 08:32 20.0 20.0 0 100  2 10:32 20.0 20.0 0 100  MSLT Summary of Naps Sleepiness Index: 0.0 Mean Sleep Latency: 20.0 Number of Naps with REM Sleep: 0 Results from Preceding PSG Study Sleep Onset Time 22:38 Sleep Efficiency (%) 91.94% Rise Time 06:22 Sleep Latency (min) 8.57min Total Sleep Time 7h 13.94min REM Latency (min) 5h 41.65min Name: Cheryl Taylor, Cheryl Taylor Reference [id number] Study Date: 10/09/2023 FMW:[id number] DOB: 11-May-1965 IMPRESSION: No evidence of hypersomnia - no sleep onset in the first 2 nap opportunities. RECOMMENDATIONS: None-   no Evidence of Hypersomnia The dx of reported  persistent hypersomnia could not be verified as abnormal sleepiness, no short sleep latency and no REM sleep onset were  observed. I attest to having reviewed every epoch of the entire raw data recording prior to the issuance of this report in accordance with the Standards of the American Academy of Sleep Medicine. DEDRA GORES, MD   ASSESSMENT & PLAN Cheryl Taylor is a 58 y.o. female who presents to the hematology clinic for evaluation of normocytic anemia.   #Normocytic anemia #Vitamin B12 deficiency: --Receives Vitamin B12 IM injections at home.  --Prior workup from 04/19/2023 ruled out paraproteinemia and inflammatory markers were normal. Peripheral smear was unremarkable.  --Labs today show stable anemia with Hgb 10.5, MCV 90.1. Iron panel is normal with ferritin 54. Vitamin B12 doesn't show deficiency and copper levels are normal.  --Discussed bone marrow biopsy versus continued observation. Since no clear etiology has been found, patient has agreed to proceed with bone marrow biopsy.  --RTC one week after bone marrow biopsy.   #Hypokalemia: --Potassium level is 2.8 today --Advised to take potassium chloride  20 mEq PO daily --Will recheck levels at next visit.   #Fatigue: --Could like secondary to persistent anemia. --Will make referral to endocrinology to determine if hormone dysfunction is contributing to fatigue   No orders of the defined types were placed in this encounter.   All questions were answered. The patient knows to call the clinic with any problems, questions or concerns.  I have spent a total of 25 minutes minutes of face-to-face and non-face-to-face time, preparing to see the patient, performing a medically appropriate examination, counseling and educating the patient, ordering tests/procedures, documenting clinical information in the electronic health record, independently interpreting results and communicating results to the patient, and care coordination.   Johnston Police, PA-C Department of Hematology/Oncology [facility] Phone:  [phone]

## 2023-10-27 ENCOUNTER — Inpatient Hospital Stay: Attending: Physician Assistant

## 2023-10-27 ENCOUNTER — Ambulatory Visit
Admission: RE | Admit: 2023-10-27 | Discharge: 2023-10-27 | Disposition: A | Discharge status: Home / Self Care | Source: Ambulatory Visit | Attending: Physician Assistant | Admitting: Physician Assistant

## 2023-10-27 ENCOUNTER — Other Ambulatory Visit: Payer: Self-pay | Admitting: Physician Assistant

## 2023-10-27 ENCOUNTER — Encounter: Payer: Self-pay | Admitting: Neurology

## 2023-10-27 ENCOUNTER — Other Ambulatory Visit: Payer: Self-pay

## 2023-10-27 ENCOUNTER — Inpatient Hospital Stay (HOSPITAL_BASED_OUTPATIENT_CLINIC_OR_DEPARTMENT_OTHER): Admitting: Physician Assistant

## 2023-10-27 VITALS — BP 182/85 | HR 61 | Temp 97.7°F | Resp 13 | Wt 168.4 lb

## 2023-10-27 DIAGNOSIS — D649 Anemia, unspecified: Secondary | ICD-10-CM

## 2023-10-27 DIAGNOSIS — D519 Vitamin B12 deficiency anemia, unspecified: Secondary | ICD-10-CM | POA: Diagnosis present

## 2023-10-27 DIAGNOSIS — E876 Hypokalemia: Secondary | ICD-10-CM | POA: Insufficient documentation

## 2023-10-27 DIAGNOSIS — R5383 Other fatigue: Secondary | ICD-10-CM | POA: Diagnosis not present

## 2023-10-27 DIAGNOSIS — R200 Anesthesia of skin: Secondary | ICD-10-CM

## 2023-10-27 DIAGNOSIS — M5412 Radiculopathy, cervical region: Secondary | ICD-10-CM

## 2023-10-27 DIAGNOSIS — R202 Paresthesia of skin: Secondary | ICD-10-CM

## 2023-10-27 LAB — FERRITIN: Ferritin: 54 ng/mL (ref 11–307)

## 2023-10-27 LAB — CBC WITH DIFFERENTIAL (CANCER CENTER ONLY)
Abs Immature Granulocytes: 0.01 K/uL (ref 0.00–0.07)
Basophils Absolute: 0.1 K/uL (ref 0.0–0.1)
Basophils Relative: 1 %
Eosinophils Absolute: 0.3 K/uL (ref 0.0–0.5)
Eosinophils Relative: 4 %
HCT: 31.9 % — ABNORMAL LOW (ref 36.0–46.0)
Hemoglobin: 10.5 g/dL — ABNORMAL LOW (ref 12.0–15.0)
Immature Granulocytes: 0 %
Lymphocytes Relative: 35 %
Lymphs Abs: 2.2 K/uL (ref 0.7–4.0)
MCH: 29.7 pg (ref 26.0–34.0)
MCHC: 32.9 g/dL (ref 30.0–36.0)
MCV: 90.1 fL (ref 80.0–100.0)
Monocytes Absolute: 0.5 K/uL (ref 0.1–1.0)
Monocytes Relative: 8 %
Neutro Abs: 3.3 K/uL (ref 1.7–7.7)
Neutrophils Relative %: 52 %
Platelet Count: 300 K/uL (ref 150–400)
RBC: 3.54 MIL/uL — ABNORMAL LOW (ref 3.87–5.11)
RDW: 12.2 % (ref 11.5–15.5)
WBC Count: 6.3 K/uL (ref 4.0–10.5)
nRBC: 0 % (ref 0.0–0.2)

## 2023-10-27 LAB — CMP (CANCER CENTER ONLY)
ALT: 32 U/L (ref 0–44)
AST: 34 U/L (ref 15–41)
Albumin: 4.8 g/dL (ref 3.5–5.0)
Alkaline Phosphatase: 88 U/L (ref 38–126)
Anion gap: 8 (ref 5–15)
BUN: 14 mg/dL (ref 6–20)
CO2: 34 mmol/L — ABNORMAL HIGH (ref 22–32)
Calcium: 9.6 mg/dL (ref 8.9–10.3)
Chloride: 98 mmol/L (ref 98–111)
Creatinine: 0.75 mg/dL (ref 0.44–1.00)
GFR, Estimated: >60 mL/min (ref >60)
Glucose, Bld: 108 mg/dL — ABNORMAL HIGH (ref 70–99)
Potassium: 2.8 mmol/L — ABNORMAL LOW (ref 3.5–5.1)
Sodium: 140 mmol/L (ref 135–145)
Total Bilirubin: 0.2 mg/dL (ref 0.0–1.2)
Total Protein: 7.8 g/dL (ref 6.5–8.1)

## 2023-10-27 LAB — RETIC PANEL
Immature Retic Fract: 6.7 % (ref 2.3–15.9)
RBC.: 3.48 MIL/uL — ABNORMAL LOW (ref 3.87–5.11)
Retic Count, Absolute: 37.2 K/uL (ref 19.0–186.0)
Retic Ct Pct: 1.1 % (ref 0.4–3.1)
Reticulocyte Hemoglobin: 33.1 pg (ref >27.9)

## 2023-10-27 LAB — IRON AND IRON BINDING CAPACITY (CC-WL,HP ONLY)
Iron: 59 ug/dL (ref 28–170)
Saturation Ratios: 15 % (ref 10.4–31.8)
TIBC: 388 ug/dL (ref 250–450)
UIBC: 329 ug/dL (ref 148–442)

## 2023-10-27 LAB — VITAMIN B12: Vitamin B-12: 1120 pg/mL — ABNORMAL HIGH (ref 180–914)

## 2023-10-29 LAB — COPPER, SERUM: Copper: 159 ug/dL — ABNORMAL HIGH (ref 80–158)

## 2023-10-31 ENCOUNTER — Ambulatory Visit: Payer: Self-pay | Admitting: Physician Assistant

## 2023-10-31 MED ORDER — POTASSIUM CHLORIDE CRYS ER 20 MEQ PO TBCR: 20.0000 meq | EXTENDED_RELEASE_TABLET | Freq: Every day | ORAL | 0 refills | Status: DC

## 2023-11-01 ENCOUNTER — Ambulatory Visit: Admitting: Internal Medicine

## 2023-11-02 ENCOUNTER — Other Ambulatory Visit: Payer: Self-pay | Admitting: Internal Medicine

## 2023-11-02 DIAGNOSIS — I10 Essential (primary) hypertension: Secondary | ICD-10-CM

## 2023-11-02 MED ORDER — HYDRALAZINE HCL 50 MG PO TABS: 50.0000 mg | ORAL_TABLET | Freq: Three times a day (TID) | ORAL | 0 refills | Status: DC

## 2023-11-03 ENCOUNTER — Encounter: Payer: Self-pay | Admitting: Physician Assistant

## 2023-11-10 ENCOUNTER — Other Ambulatory Visit: Payer: Self-pay | Admitting: Internal Medicine

## 2023-11-10 DIAGNOSIS — F411 Generalized anxiety disorder: Secondary | ICD-10-CM

## 2023-11-13 NOTE — Telephone Encounter (Signed)
 LM for pt to call the office regarding scheduling her BM BX

## 2023-11-17 ENCOUNTER — Encounter (HOSPITAL_COMMUNITY): Payer: Self-pay

## 2023-11-26 ENCOUNTER — Other Ambulatory Visit: Payer: Self-pay | Admitting: Internal Medicine

## 2023-11-26 DIAGNOSIS — I10 Essential (primary) hypertension: Secondary | ICD-10-CM

## 2023-11-27 ENCOUNTER — Other Ambulatory Visit: Payer: Self-pay

## 2023-11-27 DIAGNOSIS — D649 Anemia, unspecified: Secondary | ICD-10-CM

## 2023-11-27 DIAGNOSIS — R5383 Other fatigue: Secondary | ICD-10-CM

## 2023-12-02 ENCOUNTER — Other Ambulatory Visit: Payer: Self-pay

## 2023-12-02 ENCOUNTER — Emergency Department (HOSPITAL_BASED_OUTPATIENT_CLINIC_OR_DEPARTMENT_OTHER)
Admission: EM | Admit: 2023-12-02 | Discharge: 2023-12-02 | Disposition: A | Discharge status: Home / Self Care | Attending: Emergency Medicine | Admitting: Emergency Medicine

## 2023-12-02 DIAGNOSIS — H1089 Other conjunctivitis: Secondary | ICD-10-CM | POA: Diagnosis not present

## 2023-12-02 DIAGNOSIS — W2201XA Walked into wall, initial encounter: Secondary | ICD-10-CM | POA: Insufficient documentation

## 2023-12-02 DIAGNOSIS — Z79899 Other long term (current) drug therapy: Secondary | ICD-10-CM | POA: Insufficient documentation

## 2023-12-02 DIAGNOSIS — S01411A Laceration without foreign body of right cheek and temporomandibular area, initial encounter: Secondary | ICD-10-CM | POA: Diagnosis not present

## 2023-12-02 DIAGNOSIS — I10 Essential (primary) hypertension: Secondary | ICD-10-CM | POA: Diagnosis not present

## 2023-12-02 DIAGNOSIS — B9689 Other specified bacterial agents as the cause of diseases classified elsewhere: Secondary | ICD-10-CM | POA: Insufficient documentation

## 2023-12-02 DIAGNOSIS — S0993XA Unspecified injury of face, initial encounter: Secondary | ICD-10-CM | POA: Diagnosis present

## 2023-12-02 MED ORDER — TETRACAINE HCL 0.5 % OP SOLN
1.0000 [drp] | Freq: Once | OPHTHALMIC | Status: AC
  Administered 2023-12-02: 2 [drp] via OPHTHALMIC
  Filled 2023-12-02: qty 4

## 2023-12-02 MED ORDER — POLYMYXIN B-TRIMETHOPRIM 10000-0.1 UNIT/ML-% OP SOLN: 1.0000 [drp] | OPHTHALMIC | 0 refills | Status: DC

## 2023-12-02 MED ORDER — FLUORESCEIN SODIUM 1 MG OP STRP
1.0000 | ORAL_STRIP | Freq: Once | OPHTHALMIC | Status: AC
  Administered 2023-12-02: 1 via OPHTHALMIC
  Filled 2023-12-02: qty 1

## 2023-12-02 NOTE — Discharge Instructions (Addendum)
 Recommend warm present presses to the eyes bilaterally, apply antibiotic drops for the next 7 days and follow-up with ophthalmology on Monday at 10 AM for a recheck.

## 2023-12-02 NOTE — ED Provider Notes (Signed)
 [place] EMERGENCY DEPARTMENT AT [facility] Provider Note   CSN: [id number] Arrival date & time: 12/02/23  1723     Patient presents with: Eye Pain   Cheryl Taylor is a 58 y.o. female.    Eye Pain     59 year old female with medical history significant for depression, anxiety, HTN, Raynaud's disease, HLD, obesity, GERD, OSA, cataracts presenting to the emergency department with bilateral eye pain.  The patient denies any trauma to her eye.  She did walk into a wall and sustained a nonhealing wound to her right cheek 2 weeks ago.  It is not draining purulence and there is no surrounding redness or tenderness.  She denies any fevers or chills.  She has had some purulent drainage from her eyes bilaterally.  She feels like there is a grainy sensation in her eyes.  She does not endorse severe pain with extraocular movements.  She has had blurred vision, denies any vision loss.  Prior to Admission medications   Medication Sig Start Date End Date Taking? Authorizing Provider  trimethoprim -polymyxin b (POLYTRIM) ophthalmic solution Place 1 drop into both eyes every 4 (four) hours for 7 days. 12/02/23 12/09/23 Yes Jerrol Agent, MD  amLODipine  (NORVASC ) 10 MG tablet TAKE 1 TABLET BY MOUTH EVERY DAY 10/06/23   Joshua Debby CROME, MD  Armodafinil  250 MG tablet TAKE 1 TABLET BY MOUTH EVERY DAY **PA DENIED 10/17/23   Joshua Debby CROME, MD  carvedilol  (COREG ) 6.25 MG tablet TAKE 1 TABLET BY MOUTH TWICE A DAY WITH FOOD 12/01/23   Joshua Debby CROME, MD  Cholecalciferol (VITAMIN D3) 50 MCG (2000 UT) capsule Take 2,000 Units by mouth daily.    [provider]  cyanocobalamin  (VITAMIN B12) 1000 MCG/ML injection INJECT 1 VIAL AS DIRECTED ONCE A WEEK. 09/25/23   Joshua Debby CROME, MD  escitalopram  (LEXAPRO ) 20 MG tablet TAKE 1 TABLET BY MOUTH EVERY DAY 06/13/23   Joshua Debby CROME, MD  gabapentin  (NEURONTIN ) 300 MG capsule Take 1 capsule (300 mg total) by mouth at bedtime. 09/26/23   Ulis Bottcher,  PA-C  hydrALAZINE  (APRESOLINE ) 50 MG tablet Take 1 tablet (50 mg total) by mouth 3 (three) times daily. 11/02/23   Joshua Debby CROME, MD  hydrOXYzine  (ATARAX ) 10 MG tablet TAKE 1 TABLET BY MOUTH EVERY 8 HOURS AS NEEDED 11/10/23   Joshua Debby CROME, MD  losartan  (COZAAR ) 25 MG tablet TAKE 1 TABLET BY MOUTH EVERY DAY IN THE MORNING 08/09/23   Joshua Debby CROME, MD  MULTIPLE VITAMIN PO Take by mouth.    [provider]  omeprazole  (PRILOSEC) 20 MG capsule TAKE 1 CAPSULE BY MOUTH EVERY DAY 06/13/23   Joshua Debby CROME, MD  potassium chloride  SA (KLOR-CON  M) 20 MEQ tablet Take 1 tablet (20 mEq total) by mouth daily. 10/31/23   Neomi Johnston DASEN, PA-C  spironolactone  (ALDACTONE ) 25 MG tablet TAKE 1 TABLET (25 MG TOTAL) BY MOUTH DAILY. 03/28/23   Joshua Debby CROME, MD  torsemide  (DEMADEX ) 20 MG tablet Take 1 tablet (20 mg total) by mouth daily. 12/07/22   Croitoru, Mihai, MD  traZODone  (DESYREL ) 50 MG tablet TAKE 2 TABLETS (100 MG TOTAL) BY MOUTH AS NEEDED. 06/13/23   Joshua Debby CROME, MD  valACYclovir  (VALTREX ) 1000 MG tablet Take 1 tablet by mouth daily Patient taking differently: Take 1,000 mg by mouth as needed. 05/24/21   Joshua Debby CROME, MD    Allergies: Patient has no known allergies.    Review of Systems  Eyes:  Positive  for pain.  All other systems reviewed and are negative.   Updated Vital Signs BP (!) 159/80   Pulse 62   Temp 98.3 F (36.8 C) (Temporal)   Resp 19   Ht 5' 4 (1.626 m)   Wt 77.1 kg   LMP 11/13/2017   SpO2 99%   BMI 29.18 kg/m   Physical Exam Vitals and nursing note reviewed.  Constitutional:      General: She is not in acute distress. HENT:     Head: Normocephalic and atraumatic.     Comments: Small laceration to the right cheek, 0.5 cm, hemostatic, no surrounding erythema, no tenderness Eyes:     Conjunctiva/sclera: Conjunctivae normal.     Pupils: Pupils are equal, round, and reactive to light.  Cardiovascular:     Rate and Rhythm: Normal rate and regular rhythm.   Pulmonary:     Effort: Pulmonary effort is normal. No respiratory distress.  Abdominal:     General: There is no distension.     Tenderness: There is no guarding.  Musculoskeletal:        General: No deformity or signs of injury.     Cervical back: Neck supple.  Skin:    Findings: No lesion or rash.  Neurological:     General: No focal deficit present.     Mental Status: She is alert. Mental status is at baseline.     (all labs ordered are listed, but only abnormal results are displayed) Labs Reviewed - No data to display  EKG: None  Radiology: No results found.   Procedures   Medications Ordered in the ED  fluorescein ophthalmic strip 1 strip (has no administration in time range)  tetracaine (PONTOCAINE) 0.5 % ophthalmic solution 1-2 drop (2 drops Both Eyes Given 12/02/23 1846)                                    Medical Decision Making Risk Prescription drug management.    58 year old female with medical history significant for depression, anxiety, HTN, Raynaud's disease, HLD, obesity, GERD, OSA, cataracts presenting to the emergency department with bilateral eye pain.  The patient denies any trauma to her eye.  She did walk into a wall and sustained a nonhealing wound to her right cheek 2 weeks ago.  It is not draining purulence and there is no surrounding redness or tenderness.  She denies any fevers or chills.  She has had some purulent drainage from her eyes bilaterally.  She feels like there is a grainy sensation in her eyes.  She does not endorse severe pain with extraocular movements.  She has had blurred vision, denies any vision loss.  On arrival, the patient was afebrile, hemodynamically stable.  On exam the patient had intact visual acuity 20/70 both eyes, pain improved with tetracaine drops.  On exam the patient had no evidence of corneal abrasion or ulceration bilaterally.  He presents with grainy pain and mild discharge in her eyes bilaterally, no evidence  of proptosis, no significant pain with extraocular movements, no clear evidence of periorbital cellulitis, low concern for orbital cellulitis.  Suspect likely conjunctivitis, considered iritis.  Spoke with Dr. Marcey of on-call ophthalmology, agreed potentially likely conjunctivitis, will see to evaluate in office at 10 AM on Monday to rule out iritis.  Patient overall stable for discharge will discharge on Polytrim drops.  Patient informed of need for follow-up with ophthalmology, overall stable  for discharge.     Final diagnoses:  Bacterial conjunctivitis of both eyes    ED Discharge Orders          Ordered    trimethoprim -polymyxin b (POLYTRIM) ophthalmic solution  Every 4 hours        12/02/23 2009    Ambulatory referral to Ophthalmology        12/02/23 2050               Jerrol Agent, MD 12/02/23 2051

## 2023-12-02 NOTE — ED Triage Notes (Signed)
 Pt POV reporting bilateral eye pain and blurred vision that began yesterday, feels like something is stuck in eye, unaware of what ,ay have caused issue.

## 2023-12-05 ENCOUNTER — Encounter: Payer: Self-pay | Admitting: Internal Medicine

## 2023-12-05 ENCOUNTER — Ambulatory Visit: Admitting: Internal Medicine

## 2023-12-05 VITALS — BP 138/64 | HR 80 | Temp 97.8°F | Resp 16 | Ht 64.0 in | Wt 160.0 lb

## 2023-12-05 DIAGNOSIS — F5104 Psychophysiologic insomnia: Secondary | ICD-10-CM | POA: Diagnosis not present

## 2023-12-05 DIAGNOSIS — D649 Anemia, unspecified: Secondary | ICD-10-CM | POA: Diagnosis not present

## 2023-12-05 DIAGNOSIS — E876 Hypokalemia: Secondary | ICD-10-CM | POA: Diagnosis not present

## 2023-12-05 DIAGNOSIS — D513 Other dietary vitamin B12 deficiency anemia: Secondary | ICD-10-CM

## 2023-12-05 DIAGNOSIS — D225 Melanocytic nevi of trunk: Secondary | ICD-10-CM

## 2023-12-05 DIAGNOSIS — B86 Scabies: Secondary | ICD-10-CM

## 2023-12-05 DIAGNOSIS — E785 Hyperlipidemia, unspecified: Secondary | ICD-10-CM

## 2023-12-05 DIAGNOSIS — G252 Other specified forms of tremor: Secondary | ICD-10-CM

## 2023-12-05 DIAGNOSIS — I10 Essential (primary) hypertension: Secondary | ICD-10-CM | POA: Diagnosis not present

## 2023-12-05 LAB — CBC WITH DIFFERENTIAL/PLATELET
Basophils Absolute: 0.1 K/uL (ref 0.0–0.1)
Basophils Relative: 0.7 % (ref 0.0–3.0)
Eosinophils Absolute: 0.1 K/uL (ref 0.0–0.7)
Eosinophils Relative: 1.8 % (ref 0.0–5.0)
HCT: 29.4 % — ABNORMAL LOW (ref 36.0–46.0)
Hemoglobin: 9.9 g/dL — ABNORMAL LOW (ref 12.0–15.0)
Lymphocytes Relative: 19 % (ref 12.0–46.0)
Lymphs Abs: 1.4 K/uL (ref 0.7–4.0)
MCHC: 33.8 g/dL (ref 30.0–36.0)
MCV: 88.7 fl (ref 78.0–100.0)
Monocytes Absolute: 0.6 K/uL (ref 0.1–1.0)
Monocytes Relative: 7.6 % (ref 3.0–12.0)
Neutro Abs: 5.2 K/uL (ref 1.4–7.7)
Neutrophils Relative %: 70.9 % (ref 43.0–77.0)
Platelets: 351 K/uL (ref 150.0–400.0)
RBC: 3.32 Mil/uL — ABNORMAL LOW (ref 3.87–5.11)
RDW: 13.1 % (ref 11.5–15.5)
WBC: 7.3 K/uL (ref 4.0–10.5)

## 2023-12-05 LAB — BASIC METABOLIC PANEL WITH GFR
BUN: 29 mg/dL — ABNORMAL HIGH (ref 6–23)
CO2: 33 meq/L — ABNORMAL HIGH (ref 19–32)
Calcium: 9.5 mg/dL (ref 8.4–10.5)
Chloride: 95 meq/L — ABNORMAL LOW (ref 96–112)
Creatinine, Ser: 0.81 mg/dL (ref 0.40–1.20)
GFR: 78.68 mL/min (ref >60.00)
Glucose, Bld: 96 mg/dL (ref 70–99)
Potassium: 3.1 meq/L — ABNORMAL LOW (ref 3.5–5.1)
Sodium: 139 meq/L (ref 135–145)

## 2023-12-05 LAB — LIPID PANEL
Cholesterol: 254 mg/dL — ABNORMAL HIGH (ref 0–200)
HDL: 63.6 mg/dL (ref >39.00)
LDL Cholesterol: 171 mg/dL — ABNORMAL HIGH (ref 0–99)
NonHDL: 189.93
Total CHOL/HDL Ratio: 4
Triglycerides: 96 mg/dL (ref 0.0–149.0)
VLDL: 19.2 mg/dL (ref 0.0–40.0)

## 2023-12-05 LAB — MAGNESIUM: Magnesium: 1.9 mg/dL (ref 1.5–2.5)

## 2023-12-05 MED ORDER — PERMETHRIN 5 % EX CREA: 1.0000 | TOPICAL_CREAM | Freq: Once | CUTANEOUS | 1 refills | Status: AC

## 2023-12-05 MED ORDER — DOXEPIN HCL 10 MG PO CAPS: 10.0000 mg | ORAL_CAPSULE | Freq: Every evening | ORAL | 1 refills | Status: AC | PRN

## 2023-12-05 NOTE — Progress Notes (Signed)
 "                 Subjective:  Patient ID: Cheryl Taylor, female    DOB: 06/15/1965  Age: 58 y.o. MRN: [id number]  CC: Rash (Red, itchy rash all over body mostly extremities and abdomen. Dealing w it for last couple months, unable to see dermatology ) and Hypertension   HPI Cheryl Taylor presents for f/up -   Discussed the use of AI scribe software for clinical note transcription with the patient, who gave verbal consent to proceed.  History of Present Illness Cheryl Taylor is a 58 year old female who presents with severe itchiness and skin lesions.  She experiences severe itchiness associated with skin lesions resembling bites, initially appearing on her hands and the back of her arms, later spreading to her feet and toes. The lesions have left scars, and the itchiness is particularly severe at night. She has been using hydroxyzine , an antihistamine, but finds it ineffective. No biopsies have been performed on the lesions, and she has not been treated for scabies.  She has a cyst on her back that has been present for some time, causing discomfort as it rubs against her clothes. It is small, and she has not been able to see a dermatologist due to appointment availability issues.  She reports neurological symptoms, including ataxia and a sensation of being off balance, leading to falls and injuries such as a slow-healing cut near her eye. She experiences numbness and tingling in her hands, which has worsened over time, causing frequent dropping of objects. She has not seen a neurologist but has consulted a neurosurgeon regarding her back issues.  She has a history of sleep apnea and uses a CPAP machine, which does not improve her energy levels. She experiences significant sleep disturbances, often staying awake watching TV until early morning, affecting her personal life.  She mentions unintentional weight loss due to a lack of appetite and expresses concern about her  overall health.     Outpatient Medications Prior to Visit  Medication Sig Dispense Refill   amLODipine  (NORVASC ) 10 MG tablet TAKE 1 TABLET BY MOUTH EVERY DAY 90 tablet 0   Armodafinil  250 MG tablet TAKE 1 TABLET BY MOUTH EVERY DAY **PA DENIED 30 tablet 2   carvedilol  (COREG ) 6.25 MG tablet TAKE 1 TABLET BY MOUTH TWICE A DAY WITH FOOD 180 tablet 0   Cholecalciferol (VITAMIN D3) 50 MCG (2000 UT) capsule Take 2,000 Units by mouth daily.     cyanocobalamin  (VITAMIN B12) 1000 MCG/ML injection INJECT 1 VIAL AS DIRECTED ONCE A WEEK. 12 mL 0   escitalopram  (LEXAPRO ) 20 MG tablet TAKE 1 TABLET BY MOUTH EVERY DAY 90 tablet 1   gabapentin  (NEURONTIN ) 300 MG capsule Take 1 capsule (300 mg total) by mouth at bedtime. 90 capsule 2   hydrALAZINE  (APRESOLINE ) 50 MG tablet Take 1 tablet (50 mg total) by mouth 3 (three) times daily. 270 tablet 0   losartan  (COZAAR ) 25 MG tablet TAKE 1 TABLET BY MOUTH EVERY DAY IN THE MORNING 90 tablet 1   MULTIPLE VITAMIN PO Take by mouth.     omeprazole  (PRILOSEC) 20 MG capsule TAKE 1 CAPSULE BY MOUTH EVERY DAY 90 capsule 1   potassium chloride  SA (KLOR-CON  M) 20 MEQ tablet Take 1 tablet (20 mEq total) by mouth daily. 14 tablet 0   torsemide  (DEMADEX ) 20 MG tablet Take 1 tablet (20 mg total) by mouth daily. 90 tablet 3   traZODone  (  DESYREL ) 50 MG tablet TAKE 2 TABLETS (100 MG TOTAL) BY MOUTH AS NEEDED. 180 tablet 1   valACYclovir  (VALTREX ) 1000 MG tablet Take 1 tablet by mouth daily (Patient taking differently: Take 1,000 mg by mouth as needed.) 90 tablet 0   hydrOXYzine  (ATARAX ) 10 MG tablet TAKE 1 TABLET BY MOUTH EVERY 8 HOURS AS NEEDED 270 tablet 0   spironolactone  (ALDACTONE ) 25 MG tablet TAKE 1 TABLET (25 MG TOTAL) BY MOUTH DAILY. 90 tablet 1   trimethoprim -polymyxin b  (POLYTRIM ) ophthalmic solution Place 1 drop into both eyes every 4 (four) hours for 7 days. 10 mL 0   No facility-administered medications prior to visit.    ROS Review of Systems  Constitutional:   Positive for fatigue and unexpected weight change. Negative for appetite change, chills, diaphoresis and fever.  HENT: Negative.    Eyes: Negative.  Negative for visual disturbance.  Respiratory: Negative.  Negative for cough, chest tightness, shortness of breath and wheezing.   Cardiovascular:  Negative for chest pain, palpitations and leg swelling.  Gastrointestinal:  Negative for abdominal pain, constipation, diarrhea, nausea and vomiting.  Endocrine: Negative.   Genitourinary: Negative.  Negative for difficulty urinating and dysuria.  Musculoskeletal:  Positive for gait problem. Negative for arthralgias, myalgias and neck pain.  Skin:  Positive for rash and wound. Negative for color change and pallor.  Neurological:  Positive for dizziness, tremors, weakness and numbness. Negative for light-headedness and headaches.  Hematological:  Negative for adenopathy. Does not bruise/bleed easily.  Psychiatric/Behavioral:  Positive for decreased concentration, dysphoric mood and sleep disturbance. Negative for behavioral problems, confusion, hallucinations and suicidal ideas. The patient is nervous/anxious. The patient is not hyperactive.     Objective:  BP 138/64 (BP Location: Right Arm, Patient Position: Sitting, Cuff Size: Normal)   Pulse 80   Temp 97.8 F (36.6 C) (Temporal)   Resp 16   Ht 5' 4 (1.626 m)   Wt 160 lb (72.6 kg)   LMP 11/13/2017   SpO2 98%   BMI 27.46 kg/m   BP Readings from Last 3 Encounters:  12/05/23 138/64  12/02/23 (!) 164/78  10/27/23 (!) 182/85    Wt Readings from Last 3 Encounters:  12/05/23 160 lb (72.6 kg)  12/02/23 170 lb (77.1 kg)  10/27/23 168 lb 6.4 oz (76.4 kg)    Physical Exam Vitals reviewed.  HENT:     Right Ear: Hearing, tympanic membrane, ear canal and external ear normal.     Left Ear: Hearing, tympanic membrane, ear canal and external ear normal.     Mouth/Throat:     Mouth: Mucous membranes are moist.  Eyes:     General: No scleral  icterus.    Conjunctiva/sclera: Conjunctivae normal.  Cardiovascular:     Rate and Rhythm: Normal rate.     Heart sounds: No murmur heard.    No friction rub. No gallop.  Pulmonary:     Effort: Pulmonary effort is normal.     Breath sounds: No stridor. No wheezing, rhonchi or rales.  Abdominal:     General: Abdomen is flat.     Palpations: There is no mass.     Tenderness: There is no abdominal tenderness. There is no guarding.     Hernia: No hernia is present.  Musculoskeletal:     Cervical back: Neck supple.  Lymphadenopathy:     Cervical: No cervical adenopathy.  Skin:    Coloration: Skin is not jaundiced.     Findings: Lesion and rash present. No  bruising.  Neurological:     Mental Status: She is alert. Mental status is at baseline.  Psychiatric:        Mood and Affect: Mood normal.        Behavior: Behavior normal.     Lab Results  Component Value Date   WBC 7.3 12/05/2023   HGB 9.9 (L) 12/05/2023   HCT 29.4 (L) 12/05/2023   PLT 351.0 12/05/2023   GLUCOSE 96 12/05/2023   CHOL 254 (H) 12/05/2023   TRIG 96.0 12/05/2023   HDL 63.60 12/05/2023   LDLDIRECT 149.0 07/30/2019   LDLCALC 171 (H) 12/05/2023   ALT 32 10/27/2023   AST 34 10/27/2023   NA 139 12/05/2023   K 3.1 (L) 12/05/2023   CL 95 (L) 12/05/2023   CREATININE 0.81 12/05/2023   BUN 29 (H) 12/05/2023   CO2 33 (H) 12/05/2023   TSH 2.21 12/05/2023   HGBA1C 5.9 12/08/2020    No results found.  Assessment & Plan:  Scabies infestation -     Permethrin ; Apply 1 Application topically once for 1 dose.  Dispense: 60 g; Refill: 1 -     Doxepin  HCl; Take 1 capsule (10 mg total) by mouth at bedtime as needed.  Dispense: 90 capsule; Refill: 1  Psychophysiological insomnia -     Doxepin  HCl; Take 1 capsule (10 mg total) by mouth at bedtime as needed.  Dispense: 90 capsule; Refill: 1  Anemia, unspecified type -     CBC with Differential/Platelet; Future  Chronic hypokalemia- Will restart spironolactone . -      Magnesium ; Future -     Basic metabolic panel with GFR; Future -     AMB Referral VBCI Care Management -     Spironolactone ; Take 1 tablet (25 mg total) by mouth daily.  Dispense: 90 tablet; Refill: 0  Action tremor -     Ambulatory referral to Neurology -     TSH; Future  Essential hypertension -     AMB Referral VBCI Care Management -     TSH; Future -     Spironolactone ; Take 1 tablet (25 mg total) by mouth daily.  Dispense: 90 tablet; Refill: 0  Atypical nevus of back -     Ambulatory referral to Dermatology  Dyslipidemia, goal LDL below 130- Statin is not indicated. -     Lipid panel; Future -     TSH; Future  Other dietary vitamin B12 deficiency anemia -     Folate; Future     Follow-up: Return in about 4 months (around 04/03/2024).  Debby Molt, MD "

## 2023-12-05 NOTE — Patient Instructions (Signed)
 Rash, Adult A rash is a breakout of spots or blotches on the skin. It can affect the way the skin looks and feels. Many things can cause a rash. Common causes include: Viral infections. These include colds, measles, and hand, foot, and mouth disease. Bacterial infections. These include scarlet fever and impetigo. Fungal infections. These include athlete's foot, ringworm, and yeast rashes. Skin irritation. This may be from heat rash, exposure to moisture or friction for a long time (intertrigo), or exposure to soap or skin care products (eczema). Allergic reactions. These may be caused by foods, medicines, or things like poison ivy. Some rashes may go away after a few days. Others may last for a few weeks. The goal of treatment is to stop the itching and keep the rash from spreading. Follow these instructions at home: Medicine Take or apply over-the-counter and prescription medicines only as told by your health care provider. These may include: Corticosteroids. These can help treat red or swollen skin. They may be given as creams or as medicines to take by mouth (orally). Anti-itch lotions. Allergy medicines. Pain medicine. Antifungal medicine if the rash is from a fungal infection. Antibiotics if you have an infection.  Skin care Apply cool, wet cloths (compresses) to the affected areas. Do not scratch or rub your skin. Avoid covering the rash. Keep it exposed to air as often as you can. Managing itching and discomfort Avoid hot showers and baths. These can make itching worse. A cold shower may help. Try taking a bath with: Epsom salts. You can get these at your local pharmacy or grocery store. Follow the instructions on the package. Baking soda. Pour a small amount into the bath as told by your provider. Colloidal oatmeal. You can get this at your local pharmacy or grocery store. Follow the instructions on the package. Try putting baking soda paste on your skin. Stir water into baking  soda until it becomes like a paste. Try using calamine lotion or cortisone cream to help with itchiness. Keep cool. Stay out of the sun. Sweating and being hot can make itching worse. General instructions  Rest as needed. Drink enough fluid to keep your pee (urine) pale yellow. Wear loose-fitting clothes. Avoid scented soaps, detergents, and perfumes. Use gentle soaps, detergents, perfumes, and cosmetics. Avoid the things that cause your rash (triggers). Keep a journal to help keep track of your triggers. Write down: What you eat. What cosmetics you use. What you drink. What you wear. This includes jewelry. Contact a health care provider if: You sweat at night more than normal. You pee (urinate) more or less than normal, or your pee is a darker color than normal. Your eyes become sensitive to light. Your skin or the white parts of your eyes turn yellow (jaundice). Your skin tingles or is numb. You get painful blisters in your nose or mouth. Your rash does not go away after a few days, or it gets worse. You are more tired or thirsty than normal. You have new or worse symptoms. These may include: Pain in your abdomen. Fever. Diarrhea or vomiting. Weakness or weight loss. Get help right away if: You get confused. You have a severe headache, a stiff neck, or severe joint pain or stiffness. You become very sleepy or not responsive. You have a seizure. This information is not intended to replace advice given to you by your health care provider. Make sure you discuss any questions you have with your health care provider. Document Revised: 11/05/2021 Document Reviewed:  11/05/2021 Elsevier Patient Education  2024 [employer].

## 2023-12-06 LAB — FOLATE: Folate: 11.5 ng/mL (ref >5.9)

## 2023-12-06 LAB — TSH: TSH: 2.21 u[IU]/mL (ref 0.35–5.50)

## 2023-12-07 ENCOUNTER — Encounter: Payer: Self-pay | Admitting: Physician Assistant

## 2023-12-07 ENCOUNTER — Ambulatory Visit: Payer: Self-pay | Admitting: Internal Medicine

## 2023-12-07 MED ORDER — SPIRONOLACTONE 25 MG PO TABS: 25.0000 mg | ORAL_TABLET | Freq: Every day | ORAL | 0 refills | Status: DC

## 2023-12-08 ENCOUNTER — Other Ambulatory Visit: Payer: Self-pay | Admitting: Internal Medicine

## 2023-12-08 ENCOUNTER — Encounter: Payer: Self-pay | Admitting: Internal Medicine

## 2023-12-08 ENCOUNTER — Telehealth: Payer: Self-pay | Admitting: *Deleted

## 2023-12-08 DIAGNOSIS — B86 Scabies: Secondary | ICD-10-CM

## 2023-12-08 DIAGNOSIS — E876 Hypokalemia: Secondary | ICD-10-CM

## 2023-12-08 DIAGNOSIS — I10 Essential (primary) hypertension: Secondary | ICD-10-CM

## 2023-12-08 MED ORDER — POTASSIUM CHLORIDE CRYS ER 20 MEQ PO TBCR: 20.0000 meq | EXTENDED_RELEASE_TABLET | Freq: Two times a day (BID) | ORAL | 0 refills | Status: DC

## 2023-12-08 MED ORDER — PERMETHRIN 5 % EX CREA: 1.0000 | TOPICAL_CREAM | Freq: Once | CUTANEOUS | 1 refills | Status: AC

## 2023-12-08 NOTE — Progress Notes (Signed)
 Care Guide Pharmacy Note  12/08/2023 Name: Cheryl Taylor MRN: [id number] DOB: 1965-04-12  Referred By: Joshua Debby CROME, MD Reason for referral: Call Attempt #1 and Complex Care Management (Outreach to schedule referral with pharmacist )   Cheryl Taylor is a 58 y.o. year old female who is a primary care patient of Joshua Debby CROME, MD.  Cheryl Taylor was referred to the pharmacist for assistance related to: hypokalemia   An unsuccessful telephone outreach was attempted today to contact the patient who was referred to the pharmacy team for assistance with medication management. Additional attempts will be made to contact the patient.  Thedford Franks, CMA [place]  [facility], [facility] Guide Direct Dial: [phone]  Fax: [phone] Website: [email or web address]

## 2023-12-10 ENCOUNTER — Other Ambulatory Visit: Payer: Self-pay | Admitting: Internal Medicine

## 2023-12-10 DIAGNOSIS — F331 Major depressive disorder, recurrent, moderate: Secondary | ICD-10-CM

## 2023-12-10 DIAGNOSIS — K219 Gastro-esophageal reflux disease without esophagitis: Secondary | ICD-10-CM

## 2023-12-11 ENCOUNTER — Telehealth: Payer: Self-pay

## 2023-12-11 NOTE — Telephone Encounter (Signed)
 Received a message form Tonya Crawford that they had reached out to patient 5 times to schedule a GES with no response.  I called patient and got her voicemail.  Left message for her to call scheduling to get this test scheduled.  Tonya will defer the order for now to give her time to call

## 2023-12-11 NOTE — Progress Notes (Unsigned)
 Care Guide Pharmacy Note  12/11/2023 Name: [name] MRN: [id number] DOB: [birth year]  Referred By: Joshua Debby CROME, MD Reason for referral: Call Attempt #1 and Complex Care Management (Outreach to schedule referral with pharmacist )   Cheryl Taylor is a 58 y.o. year old female who is a primary care patient of Joshua Debby CROME, MD.  Cheryl Taylor was referred to the pharmacist for assistance related to: hypokalemia  A second unsuccessful telephone outreach was attempted today to contact the patient who was referred to the pharmacy team for assistance with medication management. Additional attempts will be made to contact the patient.  Thedford Franks, CMA [place]  [facility], [facility] Guide Direct Dial: [phone]  Fax: [phone] Website: [email or web address]

## 2023-12-12 ENCOUNTER — Other Ambulatory Visit: Payer: Self-pay | Admitting: Internal Medicine

## 2023-12-12 NOTE — Progress Notes (Signed)
 Care Guide Pharmacy Note  12/12/2023 Name: Cheryl Taylor MRN: [id number] DOB: 04/18/1965  Referred By: Joshua Debby CROME, MD Reason for referral: Call Attempt #1 and Complex Care Management (Outreach to schedule referral with pharmacist )   Cheryl Taylor is a 58 y.o. year old female who is a primary care patient of Joshua Debby CROME, MD.  Cheryl Taylor was referred to the pharmacist for assistance related to: hypokalemia   A third unsuccessful telephone outreach was attempted today to contact the patient who was referred to the pharmacy team for assistance with medication management. The Population Health team is pleased to engage with this patient at any time in the future upon receipt of referral and should he/she be interested in assistance from the Population Health team.  Thedford Franks, CMA [facility]  [facility], [facility] Guide Direct Dial: [phone]  Fax: [phone] Website: [email or web address]

## 2023-12-14 ENCOUNTER — Ambulatory Visit: Payer: Medicaid Other | Admitting: Neurology

## 2023-12-16 ENCOUNTER — Other Ambulatory Visit: Payer: Self-pay | Admitting: Internal Medicine

## 2023-12-16 DIAGNOSIS — B86 Scabies: Secondary | ICD-10-CM

## 2023-12-16 MED ORDER — PERMETHRIN 5 % EX CREA
1.0000 | TOPICAL_CREAM | Freq: Once | CUTANEOUS | 0 refills | Status: AC
Start: 2023-12-16 — End: 2023-12-16

## 2023-12-18 ENCOUNTER — Telehealth: Payer: Self-pay | Admitting: Hematology and Oncology

## 2023-12-19 ENCOUNTER — Inpatient Hospital Stay: Admitting: Physician Assistant

## 2023-12-19 ENCOUNTER — Inpatient Hospital Stay

## 2023-12-19 ENCOUNTER — Other Ambulatory Visit: Payer: Self-pay | Admitting: Internal Medicine

## 2023-12-19 DIAGNOSIS — E538 Deficiency of other specified B group vitamins: Secondary | ICD-10-CM

## 2023-12-25 ENCOUNTER — Ambulatory Visit: Admitting: Internal Medicine

## 2024-01-01 ENCOUNTER — Other Ambulatory Visit: Payer: Self-pay | Admitting: Interventional Radiology

## 2024-01-01 DIAGNOSIS — Z01818 Encounter for other preprocedural examination: Secondary | ICD-10-CM

## 2024-01-02 ENCOUNTER — Other Ambulatory Visit: Payer: Self-pay

## 2024-01-02 ENCOUNTER — Other Ambulatory Visit: Payer: Self-pay | Admitting: Internal Medicine

## 2024-01-02 DIAGNOSIS — F5104 Psychophysiologic insomnia: Secondary | ICD-10-CM

## 2024-01-02 NOTE — H&P (Signed)
 Chief Complaint: normocytic anemia of unclear etiology; referred for image guided bone marrow biopsy for further evaluation  Referring Provider(s): Thayil, Irene PA-C  Supervising Physician: Philip Cornet  Patient Status: [facility] - Out-pt  History of Present Illness: Cheryl Taylor is a 58 y.o. female with medical history significant for GERD, anxiety, HTN, HLD, obesity, and OSA on CPAP. She has continued to experience persistent fatigue. Paraproteinemia was ruled out from prior workup 04/19/2023. Patient has been receiving vitamin B12 IM injections at home. Labwork is consistent with normocytic anemia. Due to unclear etiology, patient presents today for image guided bone marrow biopsy and aspiration.Pt also with hx scabies last month which was treated (per pt.) Diagnosed with nummular dermatitis at recent dermatology appt and prescribed triamcinolone  ointment.    Allergies Reviewed:  Patient has no known allergies.   Patient is Full Code  Past Medical History:  Diagnosis Date   Alcohol  abuse, in remission    Anger    Anxiety    Arthritis    feet    Back pain    Cataract    Chest pain    Deficiency anemia 07/31/2019   Depression    Drug use    Fatigue 02/15/2015   GERD (gastroesophageal reflux disease)    Heart murmur    HTN (hypertension)    Hyperlipidemia    Obesity 08/10/2014   Obesity    Pain in both feet    arthritis / Hallox Rigidus   Perimenopausal 11/18/2013   Raynaud's disease    Screening for cervical cancer 02/06/2015   Sleep apnea    wears cpap    SOB (shortness of breath)    Vitamin D  deficiency     Past Surgical History:  Procedure Laterality Date   CESAREAN SECTION  2003   TONSILLECTOMY  1980    Medications: Prior to Admission medications   Medication Sig Start Date End Date Taking? Authorizing Provider  amLODipine  (NORVASC ) 10 MG tablet TAKE 1 TABLET BY MOUTH EVERY DAY 10/06/23   Joshua Debby CROME, MD  Armodafinil  250 MG tablet TAKE 1 TABLET BY  MOUTH EVERY DAY **PA DENIED 10/17/23   Joshua Debby CROME, MD  carvedilol  (COREG ) 6.25 MG tablet TAKE 1 TABLET BY MOUTH TWICE A DAY WITH FOOD 12/01/23   Joshua Debby CROME, MD  Cholecalciferol (VITAMIN D3) 50 MCG (2000 UT) capsule Take 2,000 Units by mouth daily.    [provider]  cyanocobalamin  (VITAMIN B12) 1000 MCG/ML injection INJECT 1 VIAL AS DIRECTED ONCE A WEEK. 12/19/23   Joshua Debby CROME, MD  doxepin  (SINEQUAN ) 10 MG capsule Take 1 capsule (10 mg total) by mouth at bedtime as needed. 12/05/23   Joshua Debby CROME, MD  escitalopram  (LEXAPRO ) 20 MG tablet TAKE 1 TABLET BY MOUTH EVERY DAY 12/14/23   Joshua Debby CROME, MD  gabapentin  (NEURONTIN ) 300 MG capsule Take 1 capsule (300 mg total) by mouth at bedtime. 09/26/23   Ulis Bottcher, PA-C  hydrALAZINE  (APRESOLINE ) 50 MG tablet Take 1 tablet (50 mg total) by mouth 3 (three) times daily. 11/02/23   Joshua Debby CROME, MD  losartan  (COZAAR ) 25 MG tablet TAKE 1 TABLET BY MOUTH EVERY DAY IN THE MORNING 08/09/23   Joshua Debby CROME, MD  MULTIPLE VITAMIN PO Take by mouth.    [provider]  omeprazole  (PRILOSEC) 20 MG capsule TAKE 1 CAPSULE BY MOUTH EVERY DAY 12/14/23   Joshua Debby CROME, MD  potassium chloride  SA (KLOR-CON  M) 20 MEQ tablet Take 1 tablet (  20 mEq total) by mouth 2 (two) times daily. 12/08/23   Joshua Debby CROME, MD  spironolactone  (ALDACTONE ) 25 MG tablet Take 1 tablet (25 mg total) by mouth daily. 12/07/23   Joshua Debby CROME, MD  torsemide  (DEMADEX ) 20 MG tablet Take 1 tablet (20 mg total) by mouth daily. 12/07/22   Croitoru, Mihai, MD  traZODone  (DESYREL ) 50 MG tablet TAKE 2 TABLETS (100 MG TOTAL) BY MOUTH AS NEEDED. 01/02/24   Joshua Debby CROME, MD  valACYclovir  (VALTREX ) 1000 MG tablet Take 1 tablet by mouth daily Patient taking differently: Take 1,000 mg by mouth as needed. 05/24/21   Joshua Debby CROME, MD     Family History  Problem Relation Age of Onset   Kidney failure Father 48       deceased   Kidney disease Father    Obesity  Father    Seizures Father    Heart disease Father 40   Hypertension Father    Hyperlipidemia Father    Stroke Father    Sleep apnea Father    Schizophrenia Brother        52yo   Alcohol  abuse Brother    Other Brother        chronic pain, 22 yo   Stroke Brother    Alcohol  abuse Sister        45yo   Obesity Paternal Grandfather    Stroke Sister    Depression Mother    Anxiety disorder Mother    Obesity Mother    Colon cancer Neg Hx    Colon polyps Neg Hx    Esophageal cancer Neg Hx    Rectal cancer Neg Hx    Stomach cancer Neg Hx     Social History   Socioeconomic History   Marital status: Married    Spouse name: Joe   Number of children: 1   Years of education: Not on file   Highest education level: Bachelor's degree (e.g., BA, AB, BS)  Occupational History   Occupation: Best Boy     Comment: [employer]  Tobacco Use   Smoking status: Never   Smokeless tobacco: Never  Vaping Use   Vaping status: Never Used  Substance and Sexual Activity   Alcohol  use: No    Alcohol /week: 50.0 standard drinks of alcohol     Types: 50 Glasses of wine per week    Comment: quit ETOH on 2013   Drug use: Yes    Types: Amphetamines    Comment: Ativan  4-5mg  daily, no longer taking   Sexual activity: Yes    Partners: Male    Birth control/protection: Pill    Comment: lives with husband and daughter, works at [employer], no dietary restrictions  Other Topics Concern   Not on file  Social History Narrative   Lives wih husband and daughter   Right handed   Caffeine: 1-2 cups a day   Social Drivers of Health   Financial Resource Strain: Low Risk  (12/04/2023)   Overall Financial Resource Strain (CARDIA)    Difficulty of Paying Living Expenses: Not hard at all  Food Insecurity: No Food Insecurity (12/04/2023)   Hunger Vital Sign    Worried About Running Out of Food in the Last Year: Never true    Ran Out of Food in the Last Year: Never true  Transportation Needs: No  Transportation Needs (12/04/2023)   PRAPARE - Administrator, Civil Service (Medical): No    Lack of Transportation (Non-Medical): No  Physical  Activity: Inactive (12/04/2023)   Exercise Vital Sign    Days of Exercise per Week: 0 days    Minutes of Exercise per Session: Not on file  Stress: Stress Concern Present (12/04/2023)   [employer] of Occupational Health - Occupational Stress Questionnaire    Feeling of Stress: To some extent  Social Connections: Moderately Integrated (12/04/2023)   Social Connection and Isolation Panel    Frequency of Communication with Friends and Family: Twice a week    Frequency of Social Gatherings with Friends and Family: Once a week    Attends Religious Services: Never    Database Administrator or Organizations: Yes    Attends Engineer, Structural: More than 4 times per year    Marital Status: Married       Review of Systems: denies fever, HA,CP,dyspnea, cough, abd/back pain,N/V or bleeding; does have some occasional itching  Vital Signs: temp 98  BP 170/89  HR  73  R 16  O2 sats 97% RA  LMP 11/13/2017     Physical Exam: awake/alert; chest- CTA bilat; heart- RRR; abd-soft,+BS,NT; no LE edema  Imaging: No results found.  Labs:  CBC: Recent Labs    04/19/23 1028 06/30/23 1252 10/27/23 0820 12/05/23 1440  WBC 6.4 6.6 6.3 7.3  HGB 10.6* 10.6* 10.5* 9.9*  HCT 32.4* 31.7* 31.9* 29.4*  PLT 324 377 300 351.0    COAGS: No results for input(s): INR, APTT in the last 8760 hours.  BMP: Recent Labs    04/19/23 1028 09/08/23 1608 10/27/23 0820 12/05/23 1440  NA 136 140 140 139  K 3.3* 3.9 2.8* 3.1*  CL 97* 95* 98 95*  CO2 32 25 34* 33*  GLUCOSE 87 96 108* 96  BUN 16 22 14  29*  CALCIUM  9.0 9.6 9.6 9.5  CREATININE 0.55 0.80 0.75 0.81  GFRNONAA >60  --  >60  --     LIVER FUNCTION TESTS: Recent Labs    04/19/23 1028 10/27/23 0820  BILITOT 0.2 0.2  AST 23 34  ALT 28 32  ALKPHOS 69 88  PROT 7.1 7.8   ALBUMIN 4.5 4.8    TUMOR MARKERS: No results for input(s): AFPTM, CEA, CA199, CHROMGRNA in the last 8760 hours.  Assessment and Plan: Normocytic anemia of unclear etiology  Request for image guided bone marrow biopsy and aspiration. No contraindications for procedure identified in ROS, physical exam, or review of pre-sedation considerations.   Risks and benefits of image guided bone marrow biopsy and aspiration was discussed with the patient and/or patient's family including, but not limited to bleeding, infection, damage to adjacent structures or low yield requiring additional tests.  All of the questions were answered and there is agreement to proceed.  Consent signed and in chart.   Thank you for allowing our service to participate in REBEKKAH POWLESS 's care.    Electronically Signed: Kristi B Davenport, NP Gabino Kelsie RIGGERS  01/02/2024, 1:50 PM     I spent a total of 20 minutes  in face to face in clinical consultation, greater than 50% of which was counseling/coordinating care for image guided bone marrow biopsy and aspiration.   (A copy of this note was sent to the referring provider and the time of visit.)

## 2024-01-03 ENCOUNTER — Ambulatory Visit

## 2024-01-03 DIAGNOSIS — L814 Other melanin hyperpigmentation: Secondary | ICD-10-CM

## 2024-01-03 DIAGNOSIS — L578 Other skin changes due to chronic exposure to nonionizing radiation: Secondary | ICD-10-CM | POA: Diagnosis not present

## 2024-01-03 DIAGNOSIS — L820 Inflamed seborrheic keratosis: Secondary | ICD-10-CM

## 2024-01-03 DIAGNOSIS — Z1283 Encounter for screening for malignant neoplasm of skin: Secondary | ICD-10-CM

## 2024-01-03 DIAGNOSIS — D229 Melanocytic nevi, unspecified: Secondary | ICD-10-CM

## 2024-01-03 DIAGNOSIS — L821 Other seborrheic keratosis: Secondary | ICD-10-CM

## 2024-01-03 DIAGNOSIS — R202 Paresthesia of skin: Secondary | ICD-10-CM

## 2024-01-03 DIAGNOSIS — L649 Androgenic alopecia, unspecified: Secondary | ICD-10-CM

## 2024-01-03 DIAGNOSIS — W908XXA Exposure to other nonionizing radiation, initial encounter: Secondary | ICD-10-CM

## 2024-01-03 DIAGNOSIS — L300 Nummular dermatitis: Secondary | ICD-10-CM

## 2024-01-03 DIAGNOSIS — D1801 Hemangioma of skin and subcutaneous tissue: Secondary | ICD-10-CM

## 2024-01-03 DIAGNOSIS — L720 Epidermal cyst: Secondary | ICD-10-CM

## 2024-01-03 DIAGNOSIS — L299 Pruritus, unspecified: Secondary | ICD-10-CM

## 2024-01-03 DIAGNOSIS — L729 Follicular cyst of the skin and subcutaneous tissue, unspecified: Secondary | ICD-10-CM

## 2024-01-03 MED ORDER — TRIAMCINOLONE ACETONIDE 0.1 % EX OINT: TOPICAL_OINTMENT | CUTANEOUS | 2 refills | Status: AC

## 2024-01-03 NOTE — Patient Instructions (Addendum)
 Silicone scar reducing sheets or gel:   Use as directed - briefly, if you choose the sheets, cut to size and you can use for up to 1 week.   You can peel off before showering/exercise and then reapply after drying the area. The patch can be washed with mild soap and water and reused until it begins to disintegrate  Each brand lasts a variable amount of time, usually at least a week per patch Replace every week and use for a total of about 8 weeks. Rarely people may develop an allergy to the adhesive used - if this happens, stop using the sheets and switch to the gel  If you use the gel, apply the gel 1-2x/day for 8 weeks total.   Brands to try: 1. Generic drugstore brands are just fine-- [facility], [facility], etc.  Just ensure silicone is the main ingredient. 2. Cica-Care Silicone Gel Sheeting:To purchase call [phone] or visit any one of several websites: Guyhumor.tn, VitalityMedical.com, Amazon.com 3. Novagel Silicone Gel Sheets: To purchase visit any of several websites: Amazon.com, AliMed.com, Guyhumor.tn 4. ScarAway: found at most major drug stores and [employer].com 5. Oleeva: fabric patches  Oleeva: https://www.norton-jordan.com/     OTC: Sarna    OTC for hair:  Begin Minoxidil 5% foam (or solution) otherwise known as Rogaine - this is often called Mens Strength - but we use it exclusively in men and women. You are going to apply this once or twice daily in areas where you feel are thinner than the remainder.  It can take up to 6-8 months of applying this medication to notice an improvement.  You will need to use this long term to maintain the results of increased hair growth.       Seborrheic Keratosis  What causes seborrheic keratoses? Seborrheic keratoses are harmless, common skin growths that first appear during adult life.  As time goes by, more growths appear.  Some people may develop a large number of them.  Seborrheic keratoses appear on both  covered and uncovered body parts.  They are not caused by sunlight.  The tendency to develop seborrheic keratoses can be inherited.  They vary in color from skin-colored to gray, brown, or even black.  They can be either smooth or have a rough, warty surface.   Seborrheic keratoses are superficial and look as if they were stuck on the skin.  Under the microscope this type of keratosis looks like layers upon layers of skin.  That is why at times the top layer may seem to fall off, but the rest of the growth remains and re-grows.    Treatment Seborrheic keratoses do not need to be treated, but can easily be removed in the office.  Seborrheic keratoses often cause symptoms when they rub on clothing or jewelry.  Lesions can be in the way of shaving.  If they become inflamed, they can cause itching, soreness, or burning.  Removal of a seborrheic keratosis can be accomplished by freezing, burning, or surgery. If any spot bleeds, scabs, or grows rapidly, please return to have it checked, as these can be an indication of a skin cancer.   Due to recent changes in healthcare laws, you may see results of your pathology and/or laboratory studies on MyChart before the doctors have had a chance to review them. We understand that in some cases there may be results that are confusing or concerning to you. Please understand that not all results are received at the same time and often the  doctors may need to interpret multiple results in order to provide you with the best plan of care or course of treatment. Therefore, we ask that you please give us  2 business days to thoroughly review all your results before contacting the office for clarification. Should we see a critical lab result, you will be contacted sooner.   If You Need Anything After Your Visit  If you have any questions or concerns for your doctor, please call our main line at [phone] and press option 4 to reach your doctor's medical assistant. If no one  answers, please leave a voicemail as directed and we will return your call as soon as possible. Messages left after 4 pm will be answered the following business day.   You may also send us  a message via MyChart. We typically respond to MyChart messages within 1-2 business days.  For prescription refills, please ask your pharmacy to contact our office. Our fax number is [phone].  If you have an urgent issue when the clinic is closed that cannot wait until the next business day, you can page your doctor at the number below.    Please note that while we do our best to be available for urgent issues outside of office hours, we are not available 24/7.   If you have an urgent issue and are unable to reach us , you may choose to seek medical care at your doctor's office, retail clinic, urgent care center, or emergency room.  If you have a medical emergency, please immediately call 911 or go to the emergency department.  Pager Numbers  - Dr. Hester: [phone]  - Dr. Jackquline: [phone]  - Dr. Claudene: [phone]   - Dr. Raymund: [phone]  In the event of inclement weather, please call our main line at [phone] for an update on the status of any delays or closures.  Dermatology Medication Tips: Please keep the boxes that topical medications come in in order to help keep track of the instructions about where and how to use these. Pharmacies typically print the medication instructions only on the boxes and not directly on the medication tubes.   If your medication is too expensive, please contact our office at [phone] option 4 or send us  a message through MyChart.   We are unable to tell what your co-pay for medications will be in advance as this is different depending on your insurance coverage. However, we may be able to find a substitute medication at lower cost or fill out paperwork to get insurance to cover a needed medication.   If a prior authorization is required  to get your medication covered by your insurance company, please allow us  1-2 business days to complete this process.  Drug prices often vary depending on where the prescription is filled and some pharmacies may offer cheaper prices.  The website www.goodrx.com contains coupons for medications through different pharmacies. The prices here do not account for what the cost may be with help from insurance (it may be cheaper with your insurance), but the website can give you the price if you did not use any insurance.  - You can print the associated coupon and take it with your prescription to the pharmacy.  - You may also stop by our office during regular business hours and pick up a GoodRx coupon card.  - If you need your prescription sent electronically to a different pharmacy, notify our office through [facility] or by phone at [phone] option 4.  Si Usted Necesita Algo Despus de Su Visita  Tambin puede enviarnos un mensaje a travs de Clinical Cytogeneticist. Por lo general respondemos a los mensajes de MyChart en el transcurso de 1 a 2 das hbiles.  Para renovar recetas, por favor pida a su farmacia que se ponga en contacto con nuestra oficina. Randi lakes de fax es [place] [phone].  Si tiene un asunto urgente cuando la clnica est cerrada y que no puede esperar hasta el siguiente da hbil, puede llamar/localizar a su doctor(a) al nmero que aparece a continuacin.   Por favor, tenga en cuenta que aunque hacemos todo lo posible para estar disponibles para asuntos urgentes fuera del horario de [place], no estamos disponibles las 24 horas del da, los 7 [address]  [address] de la [place].   Si tiene un problema urgente y no puede comunicarse con nosotros, puede optar por buscar atencin mdica  en el consultorio de su doctor(a), en una clnica privada, en un centro de atencin urgente o en una sala de emergencias.  Si tiene engineer, drilling, por favor llame inmediatamente al 911 o vaya a la sala  de emergencias.  Nmeros de bper  - Dr. Hester: [phone]  - Dra. Jackquline: [phone]  - Dr. Claudene: [phone]  - Dra. Kitts: [phone]  En caso de inclemencias del [place], por favor llame a nuestra lnea principal al [phone] para una actualizacin sobre el estado de cualquier retraso o cierre.  Consejos para la medicacin en dermatologa: Por favor, guarde las cajas en las que vienen los medicamentos de uso tpico para ayudarle a seguir las instrucciones sobre dnde y cmo usarlos. Las farmacias generalmente imprimen las instrucciones del medicamento slo en las cajas y no directamente en los tubos del [place].   Si su medicamento es muy caro, por favor, pngase en contacto con landry rieger llamando al [phone] y presione la opcin 4 o envenos un mensaje a travs de Clinical Cytogeneticist.   No podemos decirle cul ser su copago por los medicamentos por adelantado ya que esto es diferente dependiendo de la cobertura de su seguro. Sin embargo, es posible que podamos encontrar un medicamento sustituto a audiological scientist un formulario para que el seguro cubra el medicamento que se considera necesario.   Si se requiere una autorizacin previa para que su compaa de seguros cubra su medicamento, por favor permtanos de 1 a 2 das hbiles para completar este proceso.  Los precios de los medicamentos varan con frecuencia dependiendo del environmental consultant de dnde se surte la receta y alguna farmacias pueden ofrecer precios ms baratos.  El sitio web www.goodrx.com tiene cupones para medicamentos de health and safety inspector. Los precios aqu no tienen en cuenta lo que podra costar con la ayuda del seguro (puede ser ms barato con su seguro), pero el sitio web puede darle el precio si no utiliz tourist information centre manager.  - Puede imprimir el cupn correspondiente y llevarlo con su receta a la farmacia.  - Tambin puede pasar por nuestra oficina durante el horario de atencin regular y education officer, museum una  tarjeta de cupones de GoodRx.  - Si necesita que su receta se enve electrnicamente a una farmacia diferente, informe a nuestra oficina a travs de MyChart de [place] o por telfono llamando al [phone] y presione la opcin 4.

## 2024-01-03 NOTE — Progress Notes (Signed)
 Subjective   Cheryl Taylor is a 58 y.o. female who presents for the following: Total body skin exam for skin cancer screening and mole check. The patient has spots, moles and lesions to be evaluated, some may be new or changing and the patient may have concern these could be cancer. Patient is new patient  Today patient reports: Patient wants to focus a rash that is spread throughout her whole body is itchy and has been present for 6 months, episome salt baths and Cerave lotion. She has a mole on her back that was concerned by her pcp, raised black spot on back as well.   Notes itching. Inquiring about bugs/mites. Follows w/ heme onc for anemia, pending bone marrow bx. Follows w/ neurology for paresthesia.   Review of Systems:    No other skin or systemic complaints except as noted in HPI or Assessment and Plan.  The following portions of the chart were reviewed this encounter and updated as appropriate: medications, allergies, medical history  Relevant Medical History:  n/a   Objective  (SKPE) Well appearing patient in no apparent distress; mood and affect are within normal limits. Examination was performed of the: Full Skin Examination: scalp, head, eyes, ears, nose, lips, neck, chest, axillae, abdomen, back, buttocks, bilateral upper extremities, bilateral lower extremities, hands, feet, fingers, toes, fingernails, and toenails.   Examination notable for: SKIN EXAM, Angioma(s): Scattered red vascular papule(s)  , Lentigo/lentigines: Scattered pigmented macules that are tan to brown in color and are somewhat non-uniform in shape and concentrated in the sun-exposed areas, Nevus/nevi: Scattered well-demarcated, regular, pigmented macule(s) and/or papule(s)  , Seborrheic Keratosis(es): Stuck-on appearing keratotic papule(s) on the trunk, none  irritated with redness, crusting, edema, and/or partial avulsion, Actinic Damage/Elastosis: chronic sun damage: dyspigmentation, telangiectasia, and  wrinkling  Erythematous scaly plaques of L hand, L leg Prurigo nodules  Examination limited by: Undergarments     Assessment & Plan  (SKAP)   SKIN CANCER SCREENING PERFORMED TODAY.  BENIGN SKIN FINDINGS  - Lentigines  - Seborrheic keratoses  - Hemangiomas   - Nevus/Multiple Benign Nevi  - Epidermal Inclusion Cyst - back  - Reassurance provided regarding the benign appearance of lesions noted on exam today; no treatment is indicated in the absence of symptoms/changes. - Reinforced importance of photoprotective strategies including liberal and frequent sunscreen use of a broad-spectrum SPF 30 or greater, use of protective clothing, and sun avoidance for prevention of cutaneous malignancy and photoaging.  Counseled patient on the importance of regular self-skin monitoring as well as routine clinical skin examinations as scheduled.   ACTINIC DAMAGE - Chronic condition, secondary to cumulative UV/sun exposure - Recommend daily broad spectrum sunscreen SPF 30+ to sun-exposed areas, reapply every 2 hours as needed.  - Staying in the shade or wearing long sleeves, sun glasses (UVA+UVB protection) and wide brim hats (4-inch brim around the entire circumference of the hat) are also recommended for sun protection.  - Call for new or changing lesions.  Subcutaneous cyst, favor epidermal inclusion cyst at back - Explained to patient this most likely is consistent with an epidermal inclusion cyst, which represents trapped hair follicule and skin cells under the skin - Benign, patient reassured. - Given that the lesion is symptomatic, patient would like to proceed with excision. They will be scheduled for surgical removal.  Nummular dermatitis   Pruritus  Paresthesia/Neuropathy  - suspect pruritus is neuropathic in nature, bordering on DoP  start triamcinolone  ointment 0.1% twice daily to affected  areas of skin Discussed side effect of potent topical steroids including atrophy, dyspigmentation,  striae, telangectasia, folliculitis, loss of skin pigment, hair growth, tachyphylaxis, risk of systemic absorption with missuse. - Rec OTC Sarna   Androgenetic alopecia  - Rec OTC minoxidil topically   Level of service outlined above   Patient instructions (SKPI)   Procedures, orders, diagnosis for this visit:    There are no diagnoses linked to this encounter.  Return to clinic: Return if symptoms worsen or fail to improve.  I, Almetta Nora, RMA, am acting as scribe for Lauraine JAYSON Kanaris, MD .   Documentation: I have reviewed the above documentation for accuracy and completeness, and I agree with the above.  Lauraine JAYSON Kanaris, MD

## 2024-01-04 ENCOUNTER — Other Ambulatory Visit: Payer: Self-pay

## 2024-01-04 ENCOUNTER — Encounter (HOSPITAL_COMMUNITY): Payer: Self-pay

## 2024-01-04 ENCOUNTER — Other Ambulatory Visit: Payer: Self-pay | Admitting: Internal Medicine

## 2024-01-04 ENCOUNTER — Ambulatory Visit (HOSPITAL_COMMUNITY)
Admission: RE | Admit: 2024-01-04 | Discharge: 2024-01-04 | Disposition: A | Source: Ambulatory Visit | Attending: Physician Assistant

## 2024-01-04 ENCOUNTER — Inpatient Hospital Stay (HOSPITAL_COMMUNITY)
Admission: RE | Admit: 2024-01-04 | Discharge: 2024-01-04 | Disposition: A | Source: Ambulatory Visit | Attending: Physician Assistant

## 2024-01-04 DIAGNOSIS — D649 Anemia, unspecified: Secondary | ICD-10-CM

## 2024-01-04 DIAGNOSIS — R5383 Other fatigue: Secondary | ICD-10-CM

## 2024-01-04 DIAGNOSIS — I10 Essential (primary) hypertension: Secondary | ICD-10-CM

## 2024-01-04 DIAGNOSIS — Z01818 Encounter for other preprocedural examination: Secondary | ICD-10-CM

## 2024-01-04 LAB — CBC WITH DIFFERENTIAL/PLATELET
Abs Immature Granulocytes: 0.02 K/uL (ref 0.00–0.07)
Basophils Absolute: 0.1 K/uL (ref 0.0–0.1)
Basophils Relative: 1 %
Eosinophils Absolute: 0.1 K/uL (ref 0.0–0.5)
Eosinophils Relative: 2 %
HCT: 30.3 % — ABNORMAL LOW (ref 36.0–46.0)
Hemoglobin: 10.2 g/dL — ABNORMAL LOW (ref 12.0–15.0)
Immature Granulocytes: 0 %
Lymphocytes Relative: 19 %
Lymphs Abs: 1.6 K/uL (ref 0.7–4.0)
MCH: 29.7 pg (ref 26.0–34.0)
MCHC: 33.7 g/dL (ref 30.0–36.0)
MCV: 88.1 fL (ref 80.0–100.0)
Monocytes Absolute: 0.8 K/uL (ref 0.1–1.0)
Monocytes Relative: 9 %
Neutro Abs: 5.9 K/uL (ref 1.7–7.7)
Neutrophils Relative %: 69 %
Platelets: 338 K/uL (ref 150–400)
RBC: 3.44 MIL/uL — ABNORMAL LOW (ref 3.87–5.11)
RDW: 12 % (ref 11.5–15.5)
WBC: 8.5 K/uL (ref 4.0–10.5)
nRBC: 0 % (ref 0.0–0.2)

## 2024-01-04 MED ORDER — MIDAZOLAM HCL (PF) 2 MG/2ML IJ SOLN
INTRAMUSCULAR | Status: DC | PRN
  Administered 2024-01-04: .5 mg via INTRAVENOUS

## 2024-01-04 MED ORDER — FENTANYL CITRATE (PF) 100 MCG/2ML IJ SOLN
INTRAMUSCULAR | Status: DC | PRN
  Administered 2024-01-04: 50 ug via INTRAVENOUS

## 2024-01-04 MED ORDER — FENTANYL CITRATE (PF) 100 MCG/2ML IJ SOLN
INTRAMUSCULAR | Status: AC
  Filled 2024-01-04: qty 2

## 2024-01-04 MED ORDER — SODIUM CHLORIDE 0.9 % IV SOLN: INTRAVENOUS | Status: DC

## 2024-01-04 MED ORDER — FENTANYL CITRATE (PF) 100 MCG/2ML IJ SOLN
INTRAMUSCULAR | Status: DC | PRN
  Administered 2024-01-04: 25 ug via INTRAVENOUS

## 2024-01-04 MED ORDER — MIDAZOLAM HCL (PF) 2 MG/2ML IJ SOLN
INTRAMUSCULAR | Status: DC | PRN
  Administered 2024-01-04: 1 mg via INTRAVENOUS

## 2024-01-04 MED ORDER — MIDAZOLAM HCL 2 MG/2ML IJ SOLN
INTRAMUSCULAR | Status: AC
  Filled 2024-01-04: qty 2
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
## 2024-01-04 NOTE — Discharge Instructions (Addendum)
Discharge Instructions:   Please call Interventional Radiology clinic [phone] with any questions or concerns.  You may remove your dressing and shower tomorrow.  Moderate Conscious Sedation, Adult, Care After This sheet gives you information about how to care for yourself after your procedure. Your health care provider may also give you more specific instructions. If you have problems or questions, contact your health care provider. What can I expect after the procedure? After the procedure, it is common to have: Sleepiness for several hours. Impaired judgment for several hours. Difficulty with balance. Vomiting if you eat too soon. Follow these instructions at home: For the time period you were told by your health care provider: Rest. Do not participate in activities where you could fall or become injured. Do not drive or use machinery. Do not drink alcohol. Do not take sleeping pills or medicines that cause drowsiness. Do not make important decisions or sign legal documents. Do not take care of children on your own. Eating and drinking  Follow the diet recommended by your health care provider. Drink enough fluid to keep your urine pale yellow. If you vomit: Drink water, juice, or soup when you can drink without vomiting. Make sure you have little or no nausea before eating solid foods. General instructions Take over-the-counter and prescription medicines only as told by your health care provider. Have a responsible adult stay with you for the time you are told. It is important to have someone help care for you until you are awake and alert. Do not smoke. Keep all follow-up visits as told by your health care provider. This is important. Contact a health care provider if: You are still sleepy or having trouble with balance after 24 hours. You feel light-headed. You keep feeling nauseous or you keep vomiting. You develop a rash. You have a fever. You have redness or  swelling around the IV site. Get help right away if: You have trouble breathing. You have new-onset confusion at home. Summary After the procedure, it is common to feel sleepy, have impaired judgment, or feel nauseous if you eat too soon. Rest after you get home. Know the things you should not do after the procedure. Follow the diet recommended by your health care provider and drink enough fluid to keep your urine pale yellow. Get help right away if you have trouble breathing or new-onset confusion at home. This information is not intended to replace advice given to you by your health care provider. Make sure you discuss any questions you have with your health care provider. Document Revised: 05/17/2019 Document Reviewed: 12/13/2018 Elsevier Patient Education  2023 Elsevier Inc.    Bone Marrow Aspiration and Bone Marrow Biopsy, Adult, Care After This sheet gives you information about how to care for yourself after your procedure. Your health care provider may also give you more specific instructions. If you have problems or questions, contact your health care provider. What can I expect after the procedure? After the procedure, it is common to have: Mild pain and tenderness. Swelling. Bruising. Follow these instructions at home: Puncture site care  Follow instructions from your health care provider about how to take care of the puncture site. Make sure you: Wash your hands with soap and water before and after you change your bandage (dressing). If soap and water are not available, use hand sanitizer. Change your dressing as told by your health care provider. Check your puncture site every day for signs of infection. Check for: More redness, swelling, or pain.   Fluid or blood. Warmth. Pus or a bad smell. Activity Return to your normal activities as told by your health care provider. Ask your health care provider what activities are safe for you. Do not lift anything that is heavier  than 10 lb (4.5 kg), or the limit that you are told, until your health care provider says that it is safe. Do not drive for 24 hours if you were given a sedative during your procedure. General instructions  Take over-the-counter and prescription medicines only as told by your health care provider. Do not take baths, swim, or use a hot tub until your health care provider approves. Ask your health care provider if you may take showers. You may only be allowed to take sponge baths. If directed, put ice on the affected area. To do this: Put ice in a plastic bag. Place a towel between your skin and the bag. Leave the ice on for 20 minutes, 2-3 times a day. Keep all follow-up visits as told by your health care provider. This is important. Contact a health care provider if: Your pain is not controlled with medicine. You have a fever. You have more redness, swelling, or pain around the puncture site. You have fluid or blood coming from the puncture site. Your puncture site feels warm to the touch. You have pus or a bad smell coming from the puncture site. Summary After the procedure, it is common to have mild pain, tenderness, swelling, and bruising. Follow instructions from your health care provider about how to take care of the puncture site and what activities are safe for you. Take over-the-counter and prescription medicines only as told by your health care provider. Contact a health care provider if you have any signs of infection, such as fluid or blood coming from the puncture site. This information is not intended to replace advice given to you by your health care provider. Make sure you discuss any questions you have with your health care provider. Document Revised: 06/05/2018 Document Reviewed: 06/05/2018 Elsevier Patient Education  2023 Elsevier Inc. 

## 2024-01-04 NOTE — Sedation Documentation (Signed)
 RN Rosina pulled 2mg  Versed  and 100mcg Fentanyl  in CT. Pt. Received 1.5mg  Versed  and 75mcg Fentanyl  throughout the procedure. Pt tolerated the procedure well. RN Rosina wasted 0.5mg  Versed  and 25mcg Fentanyl  with RN Augustin in IR pyxis.

## 2024-01-04 NOTE — Procedures (Signed)
Interventional Radiology Procedure:   Indications: Normocytic anemia  Procedure: CT guided bone marrow biopsy  Findings: 2 aspirates and 1 core from right ilium  Complications: None     EBL: Minimal, less than 10 ml  Plan: Discharge to home in one hour.   Cheryl Taylor Pancoast, MD  Pager: [phone]

## 2024-01-04 NOTE — Progress Notes (Signed)
 1050 Ice pack given to use as needed for comfort to low back as instructed,

## 2024-01-04 NOTE — Progress Notes (Signed)
 0730 Called left a message stating she was to arrive at 0700 for her Interventional Radiology appointment today.  Asked to call back to Short Stay at [phone] to let us  know if she is coming for her appointment today.

## 2024-01-08 ENCOUNTER — Ambulatory Visit (INDEPENDENT_AMBULATORY_CARE_PROVIDER_SITE_OTHER): Admitting: Neurology

## 2024-01-08 DIAGNOSIS — R202 Paresthesia of skin: Secondary | ICD-10-CM | POA: Diagnosis not present

## 2024-01-08 DIAGNOSIS — G5601 Carpal tunnel syndrome, right upper limb: Secondary | ICD-10-CM

## 2024-01-08 NOTE — Procedures (Signed)
  [name] Neurology  [address] [place], [address]  [place], [place] [address] Tel: [phone] Fax: [phone] Test Date:  01/08/2024  Patient: Cheryl Taylor DOB: September 21, 1965 Physician: Venetia Potters, MD  Sex: Female Height: 5' 4 Ref Phys: Lyle Decamp, DEVONNA  ID#: [id number]   Technician:    History: This is a 58 year old female with numbness, tingling, and weakness in her right hand.  NCV & EMG Findings: Extensive electrodiagnostic evaluation of the right upper limb shows: Right median sensory response is absent. Right ulnar and radial sensory responses are within normal limits. Right median (APB) motor response shows prolonged distal onset latency (4.7 ms) and reduced amplitude (2.4 mV). Right ulnar (ADM) motor response is within normal limits. Chronic motor axon loss changes without accompanying active denervation changes are seen in the right abductor pollicis brevis (APB) muscle.  Impression: This is an abnormal study. The findings are most consistent with the following: Right median mononeuropathy at or distal to the wrist, consistent with carpal tunnel syndrome, severe in degree electrically. No electrodiagnostic evidence of a right cervical (C5-T1) motor radiculopathy. Screening studies for right ulnar or radial mononeuropathies are normal.    ___________________________ Venetia Potters, MD    Nerve Conduction Studies Motor Nerve Results    Latency Amplitude F-Lat Segment Distance CV Comment  Site (ms) Norm (mV) Norm (ms)  (cm) (m/s) Norm   Right Median (APB) Motor  Wrist *4.7  < 4.0 *2.4  > 5.0        Elbow 10.3 - 2.3 -  Elbow-Wrist 28.5 51  > 50   Right Ulnar (ADM) Motor  Wrist 1.60  < 3.1 11.8  > 7.0        Bel elbow 5.1 - 11.3 -  Bel elbow-Wrist 19.5 56  > 50   Ab elbow 6.7 - 11.1 -  Ab elbow-Bel elbow 10 63 -    Sensory Sites    Neg Peak Lat Amplitude (O-P) Segment Distance Velocity Comment  Site (ms) Norm (V) Norm  (cm) (ms)   Right Median Sensory   Wrist-Dig II *NR  < 3.8 *NR  > 10 Wrist-Dig II 13    Right Radial Sensory  Forearm-Wrist 2.1  < 2.8 30  > 10 Forearm-Wrist 10    Right Ulnar Sensory  Wrist-Dig V 2.7  < 3.2 22  > 5 Wrist-Dig V 11     Electromyography   Side Muscle Ins.Act Fibs Fasc Recrt Amp Dur Poly Activation Comment  Right FDI Nml Nml Nml Nml Nml Nml Nml Nml N/A  Right Pronator teres Nml Nml Nml *3- *1+ *1+ *1+ Nml N/A  Right APB Nml Nml Nml Nml Nml Nml Nml Nml N/A  Right Biceps Nml Nml Nml Nml Nml Nml Nml Nml N/A  Right Triceps Nml Nml Nml Nml Nml Nml Nml Nml N/A  Right Deltoid Nml Nml Nml Nml Nml Nml Nml Nml N/A      Waveforms:  Motor      Sensory

## 2024-01-09 LAB — SURGICAL PATHOLOGY

## 2024-01-10 ENCOUNTER — Ambulatory Visit: Payer: Self-pay | Admitting: Physician Assistant

## 2024-01-12 ENCOUNTER — Inpatient Hospital Stay: Admitting: Hematology and Oncology

## 2024-01-12 ENCOUNTER — Encounter (HOSPITAL_COMMUNITY): Payer: Self-pay

## 2024-01-12 ENCOUNTER — Inpatient Hospital Stay

## 2024-01-14 ENCOUNTER — Other Ambulatory Visit: Payer: Self-pay | Admitting: Internal Medicine

## 2024-01-14 DIAGNOSIS — I10 Essential (primary) hypertension: Secondary | ICD-10-CM

## 2024-01-14 DIAGNOSIS — E876 Hypokalemia: Secondary | ICD-10-CM

## 2024-01-15 ENCOUNTER — Ambulatory Visit: Admitting: Physician Assistant

## 2024-01-15 ENCOUNTER — Telehealth: Payer: Self-pay

## 2024-01-15 ENCOUNTER — Telehealth: Admitting: Physician Assistant

## 2024-01-15 DIAGNOSIS — G5601 Carpal tunnel syndrome, right upper limb: Secondary | ICD-10-CM

## 2024-01-15 DIAGNOSIS — H5789 Other specified disorders of eye and adnexa: Secondary | ICD-10-CM

## 2024-01-15 DIAGNOSIS — R200 Anesthesia of skin: Secondary | ICD-10-CM

## 2024-01-15 NOTE — Progress Notes (Addendum)
 [facility] Neurology  [address] [place], [address]  [place], [place] [address] Tel: [phone] Fax: [phone] Test Date:  01/08/2024   Patient: Cheryl Taylor DOB: 08/04/1965 Physician: Venetia Potters, MD  Sex: Female Height: 5' 4 Ref Phys: Lyle Decamp, DEVONNA  ID#: [id number]     Technician:      History: This is a 58 year old female with numbness, tingling, and weakness in her right hand.   NCV & EMG Findings: Extensive electrodiagnostic evaluation of the right upper limb shows: Right median sensory response is absent. Right ulnar and radial sensory responses are within normal limits. Right median (APB) motor response shows prolonged distal onset latency (4.7 ms) and reduced amplitude (2.4 mV). Right ulnar (ADM) motor response is within normal limits. Chronic motor axon loss changes without accompanying active denervation changes are seen in the right abductor pollicis brevis (APB) muscle.   Impression: This is an abnormal study. The findings are most consistent with the following: Right median mononeuropathy at or distal to the wrist, consistent with carpal tunnel syndrome, severe in degree electrically. No electrodiagnostic evidence of a right cervical (C5-T1) motor radiculopathy. Screening studies for right ulnar or radial mononeuropathies are normal.     Discussed with patient her EMG results as above.  She continues to have numbness, tingling, weakness in her right hand.  EMG demonstrated severe carpal tunnel syndrome on this side.  Discussed ultrasound-guided carpal tunnel release and the possibility of an open release if necessary.  Patient would like to go forward with surgery.  I discussed risks and benefits of surgery with her.  Will coordinate with the team in terms of getting her scheduled.   This visit was performed via telephone.  Patient location: home Provider location: office  I spent a total of 15 minutes non-face-to-face activities for this visit on the date  of this encounter including review of current clinical condition and response to treatment.  The patient is aware of and accepts the limits of this telehealth visit.

## 2024-01-15 NOTE — Telephone Encounter (Signed)
-----   Message from [place] sent at 01/15/2024  1:04 PM EST ----- Discussed with patient over the phone today.  Had EMG showing severe right sided carpal tunnel syndrome.  She would like to go forward with ultrasound-guided carpal tunnel release on her right side.  I did discuss risks and benefits with her as well as possibility of an open release if necessary.  She would like to get scheduled as soon as possible.  Thank you so much.

## 2024-01-15 NOTE — Progress Notes (Signed)
 [place] of [place]. No charge.

## 2024-01-16 ENCOUNTER — Other Ambulatory Visit: Payer: Self-pay | Admitting: Internal Medicine

## 2024-01-16 DIAGNOSIS — G4710 Hypersomnia, unspecified: Secondary | ICD-10-CM

## 2024-01-16 DIAGNOSIS — G4733 Obstructive sleep apnea (adult) (pediatric): Secondary | ICD-10-CM

## 2024-01-17 ENCOUNTER — Telehealth: Payer: Self-pay

## 2024-01-17 ENCOUNTER — Other Ambulatory Visit (HOSPITAL_COMMUNITY): Payer: Self-pay

## 2024-01-17 NOTE — Telephone Encounter (Signed)
 Pharmacy Patient Advocate Encounter  Received notification from [facility] MEDICAID that Prior Authorization for Armodafinil  250 tabs has been DENIED.  Full denial letter will be uploaded to the media tab. See denial reason below.    PA #/Case ID/Reference #: # [id number]

## 2024-01-17 NOTE — Telephone Encounter (Signed)
 Patient has been made aware of the denial VIA VM on her phone.

## 2024-01-17 NOTE — Telephone Encounter (Signed)
 Pharmacy Patient Advocate Encounter   Received notification from [employer] that prior authorization for Armodafinil  250 tabs is required/requested.   Insurance verification completed.   The patient is insured through [facility] MEDICAID.   Per test claim: PA required; PA submitted to above mentioned insurance via Latent Key/confirmation #/EOC [id number] Status is pending

## 2024-01-19 ENCOUNTER — Telehealth (HOSPITAL_BASED_OUTPATIENT_CLINIC_OR_DEPARTMENT_OTHER): Payer: Self-pay | Admitting: *Deleted

## 2024-01-19 ENCOUNTER — Other Ambulatory Visit: Payer: Self-pay

## 2024-01-19 ENCOUNTER — Telehealth: Payer: Self-pay

## 2024-01-19 DIAGNOSIS — G5601 Carpal tunnel syndrome, right upper limb: Secondary | ICD-10-CM

## 2024-01-19 DIAGNOSIS — R200 Anesthesia of skin: Secondary | ICD-10-CM

## 2024-01-19 DIAGNOSIS — Z01818 Encounter for other preprocedural examination: Secondary | ICD-10-CM

## 2024-01-19 NOTE — Telephone Encounter (Signed)
 Scheduled surgery for 1/13. Made a note on per posting sheet that she would like to move up if able

## 2024-01-19 NOTE — Telephone Encounter (Signed)
"  ° °  Pre-operative Risk Assessment    Patient Name: Cheryl Taylor  DOB: Jan 27, 1966 MRN: [id number]   Date of last office visit: 09/08/23 CAITLIN WALKER, NP Date of next office visit: NONE   Request for Surgical Clearance    Procedure:  ULTRASOUND GUIDED CARPAL TUNNEL RELEASE, (RIGHT)   Date of Surgery:  Clearance 02/13/24                                Surgeon:  DR. PENNE SHARPS Surgeon's Group or Practice Name:  [facility] Phone number:  [phone] Fax number:  [phone]   Type of Clearance Requested:   - Medical    Type of Anesthesia:  POSSIBLE CONVERT TO OPEN/MAC   Additional requests/questions:    Cheryl Taylor   01/19/2024, 1:08 PM   "

## 2024-01-19 NOTE — Telephone Encounter (Signed)
 Called patient to schedule surgery and discuss surgery instructions listed below  Please see below for information in regards to your upcoming surgery:   Planned surgery: Ultrasound guided carpal tunnel release (right) with possible convert to open   Surgery date: 02/13/24 at [facility] (Medical Mall: [address] [place], [place] [address]) - you will find out your arrival time the business day before your surgery.   Pre-op appointment at [facility] Pre-admit Testing: you will receive a call with a date/time for this appointment. If you are scheduled for an in person appointment, Pre-admit Testing is located on the first floor of the [facility] building, [address], [address]. During this appointment, they will advise you which medications you can take the morning of surgery, and which medications you will need to hold for surgery. Labs (such as blood work, EKG) may be done at your pre-op appointment. You are not required to fast for these labs. Should you need to change your pre-op appointment, please call Pre-admit testing at [phone].      Surgical clearance: we will send a clearance form to Debby Molt (PCP) & [facility]. They may wish to see you in their office prior to signing the clearance form. If so, they may call you to schedule an appointment.      How to contact us :  If you have any questions/concerns before or after surgery, you can reach us  at [phone], or you can send a mychart message. We can be reached by phone or mychart 8am-4pm, Monday-Friday.  *Please note: Calls after 4pm are forwarded to a third party answering service. Mychart messages are not routinely monitored during evenings, weekends, and holidays. Please call our office to contact the answering service for urgent concerns during non-business hours.     If you have FMLA/disability paperwork, please drop it off or  fax it to [phone]   Appointments/FMLA & disability paperwork: Reche Hait, & Nichole Registered Nurse/Surgery scheduler: Kendelyn, RN & Katie, RN Certified Medical Assistants: Don, CMA, Elenor, CMA, Damien, CMA, & Auston, [place] Physician Assistants: Lyle Decamp, PA-C, Edsel Goods, PA-C & Glade Boys, PA-C Surgeons: Penne Sharps, MD & Reeves Daisy, MD    [facility] PREADMIT TESTING VISIT and SURGERY INFORMATION SHEET   Now that surgery has been scheduled you can anticipate several phone calls from [facility] services. A pharmacy technician will call you to verify your current list of medications taken at home.               The Pre-Service Center will call to verify your insurance information and to give you billing estimates and information.             The Preadmit Testing Office will be calling to schedule a visit to obtain information for the anesthesia team and provide instructions on preparation for surgery.  What can you expect for the Preadmit Testing Visit: Appointments may be scheduled in-person or by telephone.  If a telephone visit is scheduled, you may be asked to come into the office to have lab tests or other studies performed.   This visit will not be completed any greater than 14 days prior to your surgery.  If your surgery has been scheduled for a future date, please do not be alarmed if we have not contacted you to schedule an appointment more than a month prior to the surgery date.    Please be prepared  to provide the following information during this appointment:            -Personal medical history                                               -Medication and allergy list            -Any history of problems with anesthesia              -Recent lab work or diagnostic studies            -Please notify us  of any needs we should be aware of to provide the best care possible           -You will be provided with instructions on  how to prepare for your surgery.    On The Day of Surgery:  You must have a driver to take you home after surgery, you will be asked not to drive for 24 hours following surgery.  Taxi, Gisele and non-medical transport will not be acceptable means of transportation unless you have a responsible individual who will be traveling with you.  Visitors in the surgical area:   2 people will be able to visit you in your room once your preparation for surgery has been completed. During surgery, your visitors will be asked to wait in the Surgery Waiting Area.  It is not a requirement for them to stay, if they prefer to leave and come back.  Your visitor(s) will be given an update once the surgery has been completed.  No visitors are allowed in the initial recovery room to respect patient privacy and safety.  Once you are more awake and transfer to the secondary recovery area, or are transferred to an inpatient room, visitors will again be able to see you.  To respect and protect your privacy: We will ask on the day of surgery who your driver will be and what the contact number for that individual will be. We will ask if it is okay to share information with this individual, or if there is an alternative individual that we, or the surgeon, should contact to provide updates and information. If family or friends come to the surgical information desk requesting information about you, who you have not listed with us , no information will be given.   It may be helpful to designate someone as the main contact who will be responsible for updating your other friends and family.    PREADMIT TESTING OFFICE: [phone] SAME DAY SURGERY: [phone] We look forward to caring for you before and throughout the process of your surgery.

## 2024-01-19 NOTE — Telephone Encounter (Signed)
 Pt has been scheduled tele preop appt 01/26/24. Med rec and consent are done.      Patient Consent for Virtual Visit        Cheryl Taylor has provided verbal consent on 01/19/2024 for a virtual visit (video or telephone).   CONSENT FOR VIRTUAL VISIT FOR:  Cheryl Taylor  By participating in this virtual visit I agree to the following:  I hereby voluntarily request, consent and authorize [facility] and its employed or contracted physicians, physician assistants, nurse practitioners or other licensed health care professionals (the Practitioner), to provide me with telemedicine health care services (the Services) as deemed necessary by the treating Practitioner. I acknowledge and consent to receive the Services by the Practitioner via telemedicine. I understand that the telemedicine visit will involve communicating with the Practitioner through live audiovisual communication technology and the disclosure of certain medical information by electronic transmission. I acknowledge that I have been given the opportunity to request an in-person assessment or other available alternative prior to the telemedicine visit and am voluntarily participating in the telemedicine visit.  I understand that I have the right to withhold or withdraw my consent to the use of telemedicine in the course of my care at any time, without affecting my right to future care or treatment, and that the Practitioner or I may terminate the telemedicine visit at any time. I understand that I have the right to inspect all information obtained and/or recorded in the course of the telemedicine visit and may receive copies of available information for a reasonable fee.  I understand that some of the potential risks of receiving the Services via telemedicine include:  Delay or interruption in medical evaluation due to technological equipment failure or disruption; Information transmitted may not be sufficient (e.g. poor  resolution of images) to allow for appropriate medical decision making by the Practitioner; and/or  In rare instances, security protocols could fail, causing a breach of personal health information.  Furthermore, I acknowledge that it is my responsibility to provide information about my medical history, conditions and care that is complete and accurate to the best of my ability. I acknowledge that Practitioner's advice, recommendations, and/or decision may be based on factors not within their control, such as incomplete or inaccurate data provided by me or distortions of diagnostic images or specimens that may result from electronic transmissions. I understand that the practice of medicine is not an exact science and that Practitioner makes no warranties or guarantees regarding treatment outcomes. I acknowledge that a copy of this consent can be made available to me via my patient portal [facility] MyChart), or I can request a printed copy by calling the office of [facility].    I understand that my insurance will be billed for this visit.   I have read or had this consent read to me. I understand the contents of this consent, which adequately explains the benefits and risks of the Services being provided via telemedicine.  I have been provided ample opportunity to ask questions regarding this consent and the Services and have had my questions answered to my satisfaction. I give my informed consent for the services to be provided through the use of telemedicine in my medical care

## 2024-01-19 NOTE — Telephone Encounter (Signed)
" ° °  Name: Cheryl Taylor  DOB: 1965/10/16  MRN: [id number]  Primary Cardiologist: Jerel Balding, MD  Chart reviewed as part of pre-operative protocol coverage. Because of LUWANDA STARR past medical history and time since last visit, she will require a follow-up telephone visit in order to better assess preoperative cardiovascular risk.  Pre-op covering staff: - Please schedule appointment and call patient to inform them. If patient already had an upcoming appointment within acceptable timeframe, please add pre-op clearance to the appointment notes so provider is aware. - Please contact requesting surgeon's office via preferred method (i.e, phone, fax) to inform them of need for appointment prior to surgery.  No medications indicated as needing held.  Orren LOISE Fabry, PA-C  01/19/2024, 1:24 PM   "

## 2024-01-19 NOTE — Telephone Encounter (Signed)
 Pt has been scheduled tele preop appt 01/26/24. Med rec and consent are done.

## 2024-01-26 ENCOUNTER — Ambulatory Visit: Attending: Cardiovascular Disease

## 2024-01-26 DIAGNOSIS — Z01810 Encounter for preprocedural cardiovascular examination: Secondary | ICD-10-CM

## 2024-01-26 NOTE — Progress Notes (Signed)
 Attempted to contact patient as part of preoperative protocol x 3.  I left voice message x 3.  I informed patient that she will need to contact preoperative office for setting up a new phone/virtual visit for preoperative cardiac evaluation.  Josefa HERO. Emilia Kayes NP-C     01/26/2024, 2:52 PM [facility] [address] [place], [place] [address] Office [phone]

## 2024-01-29 ENCOUNTER — Encounter: Payer: Self-pay | Admitting: Internal Medicine

## 2024-01-30 ENCOUNTER — Ambulatory Visit (INDEPENDENT_AMBULATORY_CARE_PROVIDER_SITE_OTHER): Admitting: Neurosurgery

## 2024-01-30 ENCOUNTER — Inpatient Hospital Stay: Attending: Physician Assistant

## 2024-01-30 ENCOUNTER — Inpatient Hospital Stay: Admitting: Hematology and Oncology

## 2024-01-30 ENCOUNTER — Other Ambulatory Visit: Payer: Self-pay | Admitting: Hematology and Oncology

## 2024-01-30 DIAGNOSIS — D649 Anemia, unspecified: Secondary | ICD-10-CM

## 2024-01-30 DIAGNOSIS — G5601 Carpal tunnel syndrome, right upper limb: Secondary | ICD-10-CM

## 2024-01-30 NOTE — Progress Notes (Signed)
 Unable to reach patient.  Please remove from chart and do a no charge visit.  Will plan to try to reach tomorrow.

## 2024-01-30 NOTE — Progress Notes (Deleted)
 " [facility] Telephone:([phone]) [phone]   Fax:([phone]) [phone]  PROGRESS NOTE  Patient Care Team: Joshua Debby CROME, MD as PCP - General (Internal Medicine) Croitoru, Jerel, MD as PCP - Cardiology (Cardiology) Merceda Lela SAUNDERS, RPH-CPP (Pharmacist)   CHIEF COMPLAINTS/PURPOSE OF CONSULTATION:  Normocytic anemia  HISTORY OF PRESENTING ILLNESS:  Cheryl Taylor 58 y.o. female returns for a follow up for normocytic anemia.   On exam today, Ms. Ethridge reports she continues to have persistent fatigue that does impact her ADLs including ability to work.  Her appetite and weight are fairly stable.  She denies nausea, vomiting or abdominal pain.  She was recently diagnosed with obstructive sleep apnea and uses a CPAP machine nightly.  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  She denies easy bruising or signs of active bleeding.  She denies fevers, chills, night sweats, shortness of breath, chest pain or cough.  She has no other complaints.  Rest of the 10 point ROS as below.  MEDICAL HISTORY:  Past Medical History:  Diagnosis Date   Alcohol  abuse, in remission    Anger    Anxiety    Arthritis    feet    Back pain    Cataract    Chest pain    Deficiency anemia 07/31/2019   Depression    Drug use    Fatigue 02/15/2015   GERD (gastroesophageal reflux disease)    Heart murmur    HTN (hypertension)    Hyperlipidemia    Obesity 08/10/2014   Obesity    Pain in both feet    arthritis / Hallox Rigidus   Perimenopausal 11/18/2013   Raynaud's disease    Screening for cervical cancer 02/06/2015   Sleep apnea    wears cpap    SOB (shortness of breath)    Vitamin D  deficiency     SURGICAL HISTORY: Past Surgical History:  Procedure Laterality Date   CESAREAN SECTION  2003   TONSILLECTOMY  1980    SOCIAL HISTORY: Social History   Socioeconomic History   Marital status: Married    Spouse  name: Joe   Number of children: 1   Years of education: Not on file   Highest education level: Bachelor's degree (e.g., BA, AB, BS)  Occupational History   Occupation: Best Boy     Comment: [employer]  Tobacco Use   Smoking status: Never   Smokeless tobacco: Never  Vaping Use   Vaping status: Never Used  Substance and Sexual Activity   Alcohol  use: No    Alcohol /week: 50.0 standard drinks of alcohol     Types: 50 Glasses of wine per week    Comment: quit ETOH on 2013   Drug use: Yes    Types: Amphetamines    Comment: Ativan  4-5mg  daily, no longer taking   Sexual activity: Yes    Partners: Male    Birth control/protection: Pill    Comment: lives with husband and daughter, works at [employer], no dietary restrictions  Other Topics Concern   Not on file  Social History Narrative   Lives wih husband and daughter   Right handed   Caffeine: 1-2 cups a day   Social Drivers of Health   Tobacco Use: Low Risk (01/04/2024)   Patient History    Smoking Tobacco Use: Never    Smokeless Tobacco Use: Never    Passive Exposure: Not on file  Financial Resource Strain: Low  Risk (12/04/2023)   Overall Financial Resource Strain (CARDIA)    Difficulty of Paying Living Expenses: Not hard at all  Food Insecurity: No Food Insecurity (12/04/2023)   Epic    Worried About Programme Researcher, Broadcasting/film/video in the Last Year: Never true    Ran Out of Food in the Last Year: Never true  Transportation Needs: No Transportation Needs (12/04/2023)   Epic    Lack of Transportation (Medical): No    Lack of Transportation (Non-Medical): No  Physical Activity: Inactive (12/04/2023)   Exercise Vital Sign    Days of Exercise per Week: 0 days    Minutes of Exercise per Session: Not on file  Stress: Stress Concern Present (12/04/2023)   [employer] of Occupational Health - Occupational Stress Questionnaire    Feeling of Stress: To some extent  Social Connections: Moderately Integrated (12/04/2023)   Social  Connection and Isolation Panel    Frequency of Communication with Friends and Family: Twice a week    Frequency of Social Gatherings with Friends and Family: Once a week    Attends Religious Services: Never    Database Administrator or Organizations: Yes    Attends Engineer, Structural: More than 4 times per year    Marital Status: Married  Catering Manager Violence: Not on file  Depression (PHQ2-9): Medium Risk (10/27/2023)   Depression (PHQ2-9)    PHQ-2 Score: 5  Alcohol  Screen: Low Risk (03/02/2023)   Alcohol  Screen    Last Alcohol  Screening Score (AUDIT): 0  Housing: Low Risk (12/04/2023)   Epic    Unable to Pay for Housing in the Last Year: No    Number of Times Moved in the Last Year: 0    Homeless in the Last Year: No  Utilities: Not on file  Health Literacy: Not on file    FAMILY HISTORY: Family History  Problem Relation Age of Onset   Kidney failure Father 53       deceased   Kidney disease Father    Obesity Father    Seizures Father    Heart disease Father 14   Hypertension Father    Hyperlipidemia Father    Stroke Father    Sleep apnea Father    Schizophrenia Brother        52yo   Alcohol  abuse Brother    Other Brother        chronic pain, 54 yo   Stroke Brother    Alcohol  abuse Sister        45yo   Obesity Paternal Grandfather    Stroke Sister    Depression Mother    Anxiety disorder Mother    Obesity Mother    Colon cancer Neg Hx    Colon polyps Neg Hx    Esophageal cancer Neg Hx    Rectal cancer Neg Hx    Stomach cancer Neg Hx     ALLERGIES:  has no known allergies.  MEDICATIONS:  Current Outpatient Medications  Medication Sig Dispense Refill   amLODipine  (NORVASC ) 10 MG tablet TAKE 1 TABLET BY MOUTH EVERY DAY 90 tablet 0   Armodafinil  250 MG tablet TAKE 1 TABLET BY MOUTH EVERY DAY 30 tablet 2   carvedilol  (COREG ) 6.25 MG tablet TAKE 1 TABLET BY MOUTH TWICE A DAY WITH FOOD 180 tablet 0   Cholecalciferol (VITAMIN D3) 50 MCG (2000 UT)  capsule Take 2,000 Units by mouth daily.     cyanocobalamin  (VITAMIN B12) 1000 MCG/ML injection INJECT 1 VIAL  AS DIRECTED ONCE A WEEK. 12 mL 0   doxepin  (SINEQUAN ) 10 MG capsule Take 1 capsule (10 mg total) by mouth at bedtime as needed. 90 capsule 1   escitalopram  (LEXAPRO ) 20 MG tablet TAKE 1 TABLET BY MOUTH EVERY DAY 90 tablet 1   gabapentin  (NEURONTIN ) 300 MG capsule Take 1 capsule (300 mg total) by mouth at bedtime. 90 capsule 2   hydrALAZINE  (APRESOLINE ) 50 MG tablet Take 1 tablet (50 mg total) by mouth 3 (three) times daily. 270 tablet 0   losartan  (COZAAR ) 25 MG tablet TAKE 1 TABLET BY MOUTH EVERY DAY IN THE MORNING 90 tablet 1   MULTIPLE VITAMIN PO Take by mouth.     omeprazole  (PRILOSEC) 20 MG capsule TAKE 1 CAPSULE BY MOUTH EVERY DAY 90 capsule 1   potassium chloride  SA (KLOR-CON  M) 20 MEQ tablet TAKE 1 TABLET BY MOUTH TWICE A DAY 180 tablet 1   spironolactone  (ALDACTONE ) 25 MG tablet Take 1 tablet (25 mg total) by mouth daily. 90 tablet 0   torsemide  (DEMADEX ) 20 MG tablet Take 1 tablet (20 mg total) by mouth daily. 90 tablet 3   traZODone  (DESYREL ) 50 MG tablet TAKE 2 TABLETS (100 MG TOTAL) BY MOUTH AS NEEDED. 180 tablet 1   triamcinolone  ointment (KENALOG ) 0.1 % Apply 1 gram twice daily to affected areas of skin. Stop once resolved and restart as needed for flares. Avoid use on face, armpits, groin unless otherwise indicated. 30 g 2   valACYclovir  (VALTREX ) 1000 MG tablet Take 1 tablet by mouth daily (Patient taking differently: Take 1,000 mg by mouth as needed.) 90 tablet 0   No current facility-administered medications for this visit.    REVIEW OF SYSTEMS:   Constitutional: ( - ) fevers, ( - )  chills , ( - ) night sweats Eyes: ( - ) blurriness of vision, ( - ) double vision, ( - ) watery eyes Ears, nose, mouth, throat, and face: ( - ) mucositis, ( - ) sore throat Respiratory: ( - ) cough, ( - ) dyspnea, ( - ) wheezes Cardiovascular: ( - ) palpitation, ( - ) chest discomfort,  ( - ) lower extremity swelling Gastrointestinal:  ( - ) nausea, ( - ) heartburn, ( - ) change in bowel habits Skin: ( - ) abnormal skin rashes Lymphatics: ( - ) new lymphadenopathy, ( - ) easy bruising Neurological: ( - ) numbness, ( - ) tingling, ( - ) new weaknesses Behavioral/Psych: ( - ) mood change, ( - ) new changes  All other systems were reviewed with the patient and are negative.  PHYSICAL EXAMINATION: ECOG PERFORMANCE STATUS: 1 - Symptomatic but completely ambulatory  There were no vitals filed for this visit.  There were no vitals filed for this visit.   GENERAL: well appearing female in NAD  SKIN: skin color, texture, turgor are normal, no rashes or significant lesions EYES: conjunctiva are pink and non-injected, sclera clear LUNGS: clear to auscultation and percussion with normal breathing effort HEART: regular rate & rhythm and no murmurs and no lower extremity edema Musculoskeletal: no cyanosis of digits and no clubbing  PSYCH: alert & oriented x 3, fluent speech NEURO: no focal motor/sensory deficits  LABORATORY DATA:  I have reviewed the data as listed    Latest Ref Rng & Units 01/04/2024    9:05 AM 12/05/2023    2:40 PM 10/27/2023    8:20 AM  CBC  WBC 4.0 - 10.5 K/uL 8.5  7.3  6.3  Hemoglobin 12.0 - 15.0 g/dL 89.7  9.9  89.4   Hematocrit 36.0 - 46.0 % 30.3  29.4  31.9   Platelets 150 - 400 K/uL 338  351.0  300        Latest Ref Rng & Units 12/05/2023    2:40 PM 10/27/2023    8:20 AM 09/08/2023    4:08 PM  CMP  Glucose 70 - 99 mg/dL 96  891  96   BUN 6 - 23 mg/dL 29  14  22    Creatinine 0.40 - 1.20 mg/dL 9.18  9.24  9.19   Sodium 135 - 145 mEq/L 139  140  140   Potassium 3.5 - 5.1 mEq/L 3.1  2.8  3.9   Chloride 96 - 112 mEq/L 95  98  95   CO2 19 - 32 mEq/L 33  34  25   Calcium  8.4 - 10.5 mg/dL 9.5  9.6  9.6   Total Protein 6.5 - 8.1 g/dL  7.8    Total Bilirubin 0.0 - 1.2 mg/dL  0.2    Alkaline Phos 38 - 126 U/L  88    AST 15 - 41 U/L  34    ALT 0 -  44 U/L  32      RADIOGRAPHIC STUDIES: I have personally reviewed the radiological images as listed and agreed with the findings in the report. NCV with EMG(electromyography) Result Date: 01/08/2024 Leigh Venetia CROME, MD     01/08/2024  3:55 PM [facility] [address] [place], [address]  [place], [place] [address] Tel: [phone] Fax: [phone] Test Date:  01/08/2024 Patient: Yitty Roads DOB: March 04, 1965 Physician: Venetia Leigh, MD Sex: Female Height: 5' 4 Ref Phys: Lyle Decamp, DEVONNA ID#: [id number]   Technician:  History: This is a 58 year old female with numbness, tingling, and weakness in her right hand. NCV & EMG Findings: Extensive electrodiagnostic evaluation of the right upper limb shows: Right median sensory response is absent. Right ulnar and radial sensory responses are within normal limits. Right median (APB) motor response shows prolonged distal onset latency (4.7 ms) and reduced amplitude (2.4 mV). Right ulnar (ADM) motor response is within normal limits. Chronic motor axon loss changes without accompanying active denervation changes are seen in the right abductor pollicis brevis (APB) muscle. Impression: This is an abnormal study. The findings are most consistent with the following: Right median mononeuropathy at or distal to the wrist, consistent with carpal tunnel syndrome, severe in degree electrically. No electrodiagnostic evidence of a right cervical (C5-T1) motor radiculopathy. Screening studies for right ulnar or radial mononeuropathies are normal. ___________________________ Venetia Leigh, MD Nerve Conduction Studies Motor Nerve Results   Latency Amplitude F-Lat Segment Distance CV Comment Site (ms) Norm (mV) Norm (ms)  (cm) (m/s) Norm  Right Median (APB) Motor Wrist *4.7  < 4.0 *2.4  > 5.0       Elbow 10.3 - 2.3 -  Elbow-Wrist 28.5 51  > 50  Right Ulnar (ADM) Motor Wrist 1.60  < 3.1 11.8  > 7.0       Bel elbow 5.1 - 11.3 -  Bel elbow-Wrist 19.5 56  > 50  Ab elbow 6.7 - 11.1 -   Ab elbow-Bel elbow 10 63 -  Sensory Sites   Neg Peak Lat Amplitude (O-P) Segment Distance Velocity Comment Site (ms) Norm (V) Norm  (cm) (ms)  Right Median Sensory Wrist-Dig II *NR  < 3.8 *NR  > 10 Wrist-Dig II 13   Right Radial  Sensory Forearm-Wrist 2.1  < 2.8 30  > 10 Forearm-Wrist 10   Right Ulnar Sensory Wrist-Dig V 2.7  < 3.2 22  > 5 Wrist-Dig V 11   Electromyography  Side Muscle Ins.Act Fibs Fasc Recrt Amp Dur Poly Activation Comment Right FDI Nml Nml Nml Nml Nml Nml Nml Nml N/A Right Pronator teres Nml Nml Nml *3- *1+ *1+ *1+ Nml N/A Right APB Nml Nml Nml Nml Nml Nml Nml Nml N/A Right Biceps Nml Nml Nml Nml Nml Nml Nml Nml N/A Right Triceps Nml Nml Nml Nml Nml Nml Nml Nml N/A Right Deltoid Nml Nml Nml Nml Nml Nml Nml Nml N/A Waveforms: Motor    Sensory        CT BONE MARROW BIOPSY & ASPIRATION Result Date: 01/04/2024 INDICATION: 58 year old with normocytic anemia.  Request for bone marrow biopsy. EXAM: CT GUIDED BONE MARROW ASPIRATES AND BIOPSY Physician: Juliene SAUNDERS. Philip, MD MEDICATIONS: None. ANESTHESIA/SEDATION: Moderate (conscious) sedation was employed during this procedure. A total of Versed  1.5mg  and fentanyl  75 mcg was administered intravenously at the order of the provider performing the procedure. Total intra-service moderate sedation time: 11 minutes. Patient's level of consciousness and vital signs were monitored continuously by radiology nurse throughout the procedure under the supervision of the provider performing the procedure. COMPLICATIONS: None immediate. PROCEDURE: The procedure was explained to the patient. The risks and benefits of the procedure were discussed and the patient's questions were addressed. Informed consent was obtained from the patient. The patient was placed prone on CT table. Images of the pelvis were obtained. The right side of back was prepped and draped in sterile fashion. The skin and right posterior ilium were anesthetized with 1% lidocaine. 11 gauge bone needle was  directed into the right ilium with CT guidance. Two aspirates and one core biopsy were obtained. Bandage placed over the puncture site. RADIATION DOSE REDUCTION: This exam was performed according to the departmental dose-optimization program which includes automated exposure control, adjustment of the mA and/or kV according to patient size and/or use of iterative reconstruction technique. IMPRESSION: CT guided bone marrow aspiration and core biopsy. Electronically Signed   By: Juliene Philip M.D.   On: 01/04/2024 10:55    ASSESSMENT & PLAN AERIEL BOULAY is a 58 y.o. female who presents to the hematology clinic for evaluation of normocytic anemia.   #Normocytic anemia #Vitamin B12 deficiency: --Receives Vitamin B12 IM injections at home.  --Prior workup from 04/19/2023 ruled out paraproteinemia and inflammatory markers were normal. Peripheral smear was unremarkable.  --Labs today show stable anemia with Hgb 10.5, MCV 90.1. Iron panel is normal with ferritin 54. Vitamin B12 doesn't show deficiency and copper  levels are normal.  --Discussed bone marrow biopsy versus continued observation. Since no clear etiology has been found, patient has agreed to proceed with bone marrow biopsy.  --RTC one week after bone marrow biopsy.   #Hypokalemia: --Potassium level is 2.8 today --Advised to take potassium chloride  20 mEq PO daily --Will recheck levels at next visit.   #Fatigue: --Could like secondary to persistent anemia. --Will make referral to endocrinology to determine if hormone dysfunction is contributing to fatigue   No orders of the defined types were placed in this encounter.   All questions were answered. The patient knows to call the clinic with any problems, questions or concerns.  I have spent a total of 25 minutes minutes of face-to-face and non-face-to-face time, preparing to see the patient, performing a medically appropriate examination, counseling and educating the  patient, ordering  tests/procedures, documenting clinical information in the electronic health record, independently interpreting results and communicating results to the patient, and care coordination.   Johnston Police, PA-C Department of Hematology/Oncology [facility] Phone: [phone]   "

## 2024-01-31 ENCOUNTER — Other Ambulatory Visit: Payer: Self-pay | Admitting: Internal Medicine

## 2024-01-31 DIAGNOSIS — I10 Essential (primary) hypertension: Secondary | ICD-10-CM

## 2024-02-02 NOTE — Telephone Encounter (Signed)
 Attempted to call patient on 01/30/24 and left voicemail letting patient know that she needed to schedule appointment with cardiology for clearance.   02/02/24, attempted to call patient, but phone goes straight to voicemail. No upcoming cardiology appointments noted at this time.

## 2024-02-05 NOTE — Telephone Encounter (Signed)
 Attempted to call patient again unsuccessfully. Called patient husband, who said that the patient lost her phone while traveling and has a new temporary telephone number [phone].   Informed him that patient has a PAT appointment tomorrow, needs to schedule a surgical clearance appointment with cardiology, needs scabies treated, needs to recover from her respiratory illness, and needs to talk to Dr. Claudene prior to having surgery next week. Husband stated that he would relay this information to her. Informed him that any delay or inability to complete these tasks will result in surgery being delayed.

## 2024-02-05 NOTE — Telephone Encounter (Signed)
 Attempted to call patient again, phone goes straight to voicemail.  Left voicemail for husband stating that there are still several outstanding things that need to happen prior to patient surgery and to please ask patient to give us  a call.

## 2024-02-06 ENCOUNTER — Encounter
Admission: RE | Admit: 2024-02-06 | Discharge: 2024-02-06 | Disposition: A | Discharge status: Home / Self Care | Source: Ambulatory Visit | Attending: Neurosurgery | Admitting: Neurosurgery

## 2024-02-06 ENCOUNTER — Telehealth: Admitting: Neurosurgery

## 2024-02-06 ENCOUNTER — Other Ambulatory Visit: Payer: Self-pay

## 2024-02-06 ENCOUNTER — Encounter: Admission: RE | Admit: 2024-02-06 | Discharge: 2024-02-06 | Disposition: A | Source: Ambulatory Visit

## 2024-02-06 VITALS — Ht 64.0 in | Wt 170.0 lb

## 2024-02-06 DIAGNOSIS — Z01810 Encounter for preprocedural cardiovascular examination: Secondary | ICD-10-CM | POA: Diagnosis not present

## 2024-02-06 DIAGNOSIS — Z01818 Encounter for other preprocedural examination: Secondary | ICD-10-CM

## 2024-02-06 DIAGNOSIS — G5601 Carpal tunnel syndrome, right upper limb: Secondary | ICD-10-CM | POA: Diagnosis not present

## 2024-02-06 LAB — CBC
HCT: 34.4 % — ABNORMAL LOW (ref 36.0–46.0)
Hemoglobin: 11.6 g/dL — ABNORMAL LOW (ref 12.0–15.0)
MCH: 28.5 pg (ref 26.0–34.0)
MCHC: 33.7 g/dL (ref 30.0–36.0)
MCV: 84.5 fL (ref 80.0–100.0)
Platelets: 433 K/uL — ABNORMAL HIGH (ref 150–400)
RBC: 4.07 MIL/uL (ref 3.87–5.11)
RDW: 12.3 % (ref 11.5–15.5)
WBC: 10.7 K/uL — ABNORMAL HIGH (ref 4.0–10.5)
nRBC: 0 % (ref 0.0–0.2)

## 2024-02-06 LAB — BASIC METABOLIC PANEL WITH GFR
Anion gap: 15 (ref 5–15)
BUN: 10 mg/dL (ref 6–20)
CO2: 26 mmol/L (ref 22–32)
Calcium: 9.6 mg/dL (ref 8.9–10.3)
Chloride: 100 mmol/L (ref 98–111)
Creatinine, Ser: 0.79 mg/dL (ref 0.44–1.00)
GFR, Estimated: >60 mL/min
Glucose, Bld: 110 mg/dL — ABNORMAL HIGH (ref 70–99)
Potassium: 3.1 mmol/L — ABNORMAL LOW (ref 3.5–5.1)
Sodium: 140 mmol/L (ref 135–145)

## 2024-02-06 NOTE — Telephone Encounter (Signed)
 Spoke with [name]. Patient had PAT appointment and labs have been drawn. Per patient, scabies has been treated. Patient called Cardiology and left voicemail for appointment for clearance. Telephone visit scheduled with Dr. Claudene to discuss upcoming surgery for this Thursday.

## 2024-02-06 NOTE — Telephone Encounter (Signed)
 S/w the pt and she has rescheduled her missed tele preop appt. Procedure 02/13/24, tele scheduled for 02/08/24.

## 2024-02-06 NOTE — Telephone Encounter (Signed)
 Patient called to re-schedule her tele pre-op appointment.

## 2024-02-06 NOTE — Patient Instructions (Addendum)
 Your procedure is scheduled on: 02/13/24 Report to the Registration Desk on the 1st floor of the [facility]. To find out your arrival time, please call [phone] between 1PM - 3PM on: 02/12/24 If your arrival time is 6:00 am, do not arrive before that time as the [facility] entrance doors do not open until 6:00 am.  REMEMBER: Instructions that are not followed completely may result in serious medical risk, up to and including death; or upon the discretion of your surgeon and anesthesiologist your surgery may need to be rescheduled.  Do not eat food after midnight the night before surgery.  No gum chewing or hard candies.  You may however, drink CLEAR liquids up to 2 hours before you are scheduled to arrive for your surgery. Do not drink anything within 2 hours of your scheduled arrival time.  Clear liquids include: - water  - apple juice without pulp - gatorade (not RED colors) - black coffee or tea (Do NOT add milk or creamers to the coffee or tea) Do NOT drink anything that is not on this list.  One week prior to surgery: Stop Anti-inflammatories (NSAIDS) such as Advil , Aleve , Ibuprofen , Motrin , Naproxen , Naprosyn  and Aspirin  based products such as Excedrin, Goody's Powder, BC Powder. Stop ANY OVER THE COUNTER supplements until after surgery.  You may however, continue to take Tylenol  if needed for pain up until the day of surgery.   Continue taking all of your other prescription medications up until the day of surgery.  ON THE DAY OF SURGERY ONLY TAKE THESE MEDICATIONS WITH SIPS OF WATER:  amLODipine  (NORVASC )  carvedilol  (COREG )  escitalopram  (LEXAPRO )  omeprazole  (PRILOSEC)   Use inhalers on the day of surgery and bring to the hospital.  No Alcohol  for 24 hours before or after surgery.  No Smoking including e-cigarettes for 24 hours before surgery.  No chewable tobacco products for at least 6 hours before surgery.  No nicotine patches on the day of surgery.  Do  not use any recreational drugs for at least a week (preferably 2 weeks) before your surgery.  Please be advised that the combination of cocaine and anesthesia may have negative outcomes, up to and including death. If you test positive for cocaine, your surgery will be cancelled.  On the morning of surgery brush your teeth with toothpaste and water, you may rinse your mouth with mouthwash if you wish. Do not swallow any toothpaste or mouthwash.  Use CHG Soap or wipes as directed on instruction sheet.  Do not wear jewelry, make-up, hairpins, clips or nail polish.  For welded (permanent) jewelry: bracelets, anklets, waist bands, etc.  Please have this removed prior to surgery.  If it is not removed, there is a chance that hospital personnel will need to cut it off on the day of surgery.  Do not wear lotions, powders, or perfumes.   Do not shave body hair from the neck down 48 hours before surgery.  Contact lenses, hearing aids and dentures may not be worn into surgery.  Do not bring valuables to the hospital. [facility] is not responsible for any missing/lost belongings or valuables.   Notify your doctor if there is any change in your medical condition (cold, fever, infection).  Wear comfortable clothing (specific to your surgery type) to the hospital.  After surgery, you can help prevent lung complications by doing breathing exercises.  Take deep breaths and cough every 1-2 hours. Your doctor may order a device called an Facilities Manager  to help you take deep breaths.  If you are being discharged the day of surgery, you will not be allowed to drive home. You will need a responsible individual to drive you home and stay with you for 24 hours after surgery.   If you are taking public transportation, you will need to have a responsible individual with you.  Please call the Pre-admissions Testing Dept. at [phone] if you have any questions about these instructions.  Surgery  Visitation Policy:  Patients having surgery or a procedure may have two visitors.  Children under the age of 2 must have an adult with them who is not the patient.  Merchandiser, Retail to address health-related social needs:  [email or web address]                                                                                                             Preparing for Surgery with CHLORHEXIDINE GLUCONATE (CHG) Soap  Chlorhexidine Gluconate (CHG) Soap   o An antiseptic cleaner that kills germs and bonds with the skin to continue killing germs even after washing  o Used for showering the night before surgery and morning of surgery  Before surgery, you can play an important role by reducing the number of germs on your skin.  CHG (Chlorhexidine gluconate) soap is an antiseptic cleanser which kills germs and bonds with the skin to continue killing germs even after washing.  Please do not use if you have an allergy to CHG or antibacterial soaps. If your skin becomes reddened/irritated stop using the CHG.  1. Shower the NIGHT BEFORE SURGERY with CHG soap.  2. If you choose to wash your hair, wash your hair first as usual with your normal shampoo.  3. After shampooing, rinse your hair and body thoroughly to remove the shampoo.  4. Use CHG as you would any other liquid soap. You can apply CHG directly to the skin and wash gently with a clean washcloth.  5. Apply the CHG soap to your body only from the neck down. Do not use on open wounds or open sores. Avoid contact with your eyes, ears, mouth, and genitals (private parts). Wash face and genitals (private parts) with your normal soap.  6. Wash thoroughly, paying special attention to the area where your surgery will be performed.  7. Thoroughly rinse your body with warm water.  8. Do not shower/wash with your normal soap after using and rinsing off the CHG soap.  9. Do not use lotions, oils, etc., after showering with  CHG.  10. Pat yourself dry with a clean towel.  11. Wear clean pajamas to bed the night before surgery.  12. Place clean sheets on your bed the night of your shower and do not sleep with pets.  13. Do not apply any deodorants/lotions/powders.  14. Please wear clean clothes to the hospital.   15. Remember to brush your teeth with your regular toothpaste.

## 2024-02-07 ENCOUNTER — Ambulatory Visit: Admitting: Internal Medicine

## 2024-02-07 ENCOUNTER — Encounter: Payer: Self-pay | Admitting: Internal Medicine

## 2024-02-07 NOTE — Telephone Encounter (Signed)
 Yes, I should see her in person for this

## 2024-02-08 ENCOUNTER — Ambulatory Visit: Attending: Student in an Organized Health Care Education/Training Program | Admitting: Nurse Practitioner

## 2024-02-08 ENCOUNTER — Ambulatory Visit: Admitting: Neurosurgery

## 2024-02-08 ENCOUNTER — Telehealth: Payer: Self-pay

## 2024-02-08 DIAGNOSIS — G5601 Carpal tunnel syndrome, right upper limb: Secondary | ICD-10-CM

## 2024-02-08 DIAGNOSIS — Z01810 Encounter for preprocedural cardiovascular examination: Secondary | ICD-10-CM

## 2024-02-08 NOTE — Telephone Encounter (Signed)
 Patient did not answer for telephone visit with Dr Claudene again today. Patient was notified of appointment with Dr Claudene on 02/06/24 Hershall, RN personally spoke with the patient on the phone).  There have been multiple unsuccessful attempts to reach this patient over the past couple of weeks. Izetta, RN informed the patient on 02/06/24 that she would need to complete a visit with cardiology and Dr Claudene in order to proceed with surgery on 02/13/24. Izetta also spoke with her husband about this on 02/05/24.  Per review of her chart, she also did not answer for her telephone visit with cardiology for clearance today. This also happened on 01/30/24 with Dr Claudene and 01/26/24 with cardiology.   Per discussion with Dr Claudene, we will cancel the surgery on 02/13/24 until she is cleared by cardiology. Once she is cleared, he will see her in office for an in person appointment. At that time, we will discuss rescheduling her surgery to a new date.  I sent a mychart message to the patient regarding the above.

## 2024-02-08 NOTE — Progress Notes (Signed)
"  ° °  Patient Name: Cheryl Taylor  DOB: Jan 14, 1966 MRN: [id number]  Primary Cardiologist: Jerel Balding, MD  Chart reviewed as part of pre-operative protocol coverage.  Multiple attempts made to contact patient for scheduled preop telephone visit on 02/08/2024.  No answer.  Left voicemail for patient to return call with no response.  I will reach out to our preop coverage team to reschedule patient's preoperative telephone visit.   Damien JAYSON Braver, NP 02/08/2024, 3:57 PM    "

## 2024-02-08 NOTE — Progress Notes (Signed)
 Dr Claudene attempted to contact Mrs Cheryl Taylor this afternoon to complete their scheduled telephone visit. He was unable to reach her and left a voicemail.

## 2024-02-09 ENCOUNTER — Telehealth: Payer: Self-pay | Admitting: Hematology and Oncology

## 2024-02-09 ENCOUNTER — Ambulatory Visit: Attending: Nurse Practitioner | Admitting: Nurse Practitioner

## 2024-02-09 ENCOUNTER — Telehealth: Payer: Self-pay

## 2024-02-09 ENCOUNTER — Other Ambulatory Visit: Payer: Self-pay

## 2024-02-09 DIAGNOSIS — Z01810 Encounter for preprocedural cardiovascular examination: Secondary | ICD-10-CM | POA: Diagnosis present

## 2024-02-09 DIAGNOSIS — G5601 Carpal tunnel syndrome, right upper limb: Secondary | ICD-10-CM

## 2024-02-09 DIAGNOSIS — Z01818 Encounter for other preprocedural examination: Secondary | ICD-10-CM

## 2024-02-09 NOTE — Telephone Encounter (Signed)
 Rescheduled patient for surgery on 02/27/24. No prior auth required. Case booked, orders placed. Post op appointments made. Patient has already received clearance and had her PAT appointment. Needs to see Dr. Claudene prior to surgery.

## 2024-02-09 NOTE — Telephone Encounter (Signed)
 Patient has been reschedule to today @ 1:20 for pre-op clearance.

## 2024-02-09 NOTE — Telephone Encounter (Signed)
 I returned patient's call to reschedule missed lab and MD from 01/17/2024 to 02/23/2024.

## 2024-02-09 NOTE — Telephone Encounter (Signed)
 I spoke with Mrs Cheryl Taylor.She states she has an appointment with [facility] today. At this time, I do not see an appointment scheduled. She asked if she could have surgery on 02/13/24.  I informed her that she would need to be cleared by them and still see Dr Claudene prior to having surgery. I have asked her to contact us  once she is cleared by cardiology and we will go from there.

## 2024-02-09 NOTE — Progress Notes (Signed)
 "   Virtual Visit via Telephone Note   Because of Cheryl Taylor co-morbid illnesses, she is at least at moderate risk for complications without adequate follow up.  This format is felt to be most appropriate for this patient at this time.  Due to technical limitations with video connection (technology), today's appointment will be conducted as an audio only telehealth visit, and Cheryl Taylor verbally agreed to proceed in this manner.   All issues noted in this document were discussed and addressed.  No physical exam could be performed with this format.  Evaluation Performed:  Preoperative cardiovascular risk assessment _____________   Date:  02/09/2024   Patient ID:  Cheryl Taylor, DOB 02-Mar-1965, MRN [id number] Patient Location:  Home Provider location:   Office  Primary Care Provider:  Joshua Debby CROME, MD Primary Cardiologist:  Jerel Balding, MD  Chief Complaint / Patient Profile   59 y.o. y/o female with a h/o chronic diastolic heart failure, aortic stenosis, mitral valve regurgitation, hypertension, hyperlipidemia, and OSA who is pending  ULTRASOUND GUIDED CARPAL TUNNEL RELEASE, (RIGHT) on 02/13/2024 with Dr. Penne Sharps of [facility] at [facility] and presents today for telephonic preoperative cardiovascular risk assessment.  History of Present Illness    Cheryl Taylor is a 59 y.o. female who presents via audio/video conferencing for a telehealth visit today.  Pt was last seen in cardiology clinic on 09/08/2023 by Reche Finder, NP. At that time Cheryl Taylor was doing well.  The patient is now pending procedure as outlined above. Since her last visit, she has done well from a cardiac standpoint.  She is recovering from the flu.  She denies chest pain, palpitations, dyspnea, pnd, orthopnea, n, v, dizziness, syncope, edema, weight gain, or early satiety. All other systems reviewed and are otherwise negative except as noted above.   Past Medical History    Past  Medical History:  Diagnosis Date   Alcohol  abuse, in remission    Anger    Anxiety    Arthritis    feet    Back pain    Cataract    Chest pain    Deficiency anemia 07/31/2019   Depression    Drug use    Fatigue 02/15/2015   GERD (gastroesophageal reflux disease)    Heart murmur    HTN (hypertension)    Hyperlipidemia    Obesity 08/10/2014   Obesity    Pain in both feet    arthritis / Hallox Rigidus   Perimenopausal 11/18/2013   Raynaud's disease    Screening for cervical cancer 02/06/2015   Sleep apnea    wears cpap    SOB (shortness of breath)    Vitamin D  deficiency    Past Surgical History:  Procedure Laterality Date   CESAREAN SECTION  2003   TONSILLECTOMY  1980    Allergies  Allergies[1]  Home Medications    Prior to Admission medications  Medication Sig Start Date End Date Taking? Authorizing Provider  amLODipine  (NORVASC ) 10 MG tablet TAKE 1 TABLET BY MOUTH EVERY DAY 01/05/24   Joshua Debby CROME, MD  Armodafinil  250 MG tablet TAKE 1 TABLET BY MOUTH EVERY DAY 01/16/24   Joshua Debby CROME, MD  carvedilol  (COREG ) 6.25 MG tablet TAKE 1 TABLET BY MOUTH TWICE A DAY WITH FOOD 12/01/23   Joshua Debby CROME, MD  Cholecalciferol (VITAMIN D3) 50 MCG (2000 UT) capsule Take 2,000 Units by mouth daily.    [provider]  cyanocobalamin  (VITAMIN B12) 1000  MCG/ML injection INJECT 1 VIAL AS DIRECTED ONCE A WEEK. 12/19/23   Joshua Debby CROME, MD  doxepin  (SINEQUAN ) 10 MG capsule Take 1 capsule (10 mg total) by mouth at bedtime as needed. 12/05/23   Joshua Debby CROME, MD  escitalopram  (LEXAPRO ) 20 MG tablet TAKE 1 TABLET BY MOUTH EVERY DAY 12/14/23   Joshua Debby CROME, MD  gabapentin  (NEURONTIN ) 300 MG capsule Take 1 capsule (300 mg total) by mouth at bedtime. 09/26/23   Ulis Bottcher, PA-C  hydrALAZINE  (APRESOLINE ) 50 MG tablet TAKE 1 TABLET BY MOUTH THREE TIMES A DAY 02/05/24   Joshua Debby CROME, MD  losartan  (COZAAR ) 25 MG tablet TAKE 1 TABLET BY MOUTH EVERY DAY IN THE MORNING  08/09/23   Joshua Debby CROME, MD  MULTIPLE VITAMIN PO Take by mouth.    [provider]  omeprazole  (PRILOSEC) 20 MG capsule TAKE 1 CAPSULE BY MOUTH EVERY DAY 12/14/23   Joshua Debby CROME, MD  potassium chloride  SA (KLOR-CON  M) 20 MEQ tablet TAKE 1 TABLET BY MOUTH TWICE A DAY 01/15/24   Joshua Debby CROME, MD  spironolactone  (ALDACTONE ) 25 MG tablet Take 1 tablet (25 mg total) by mouth daily. 12/07/23   Joshua Debby CROME, MD  torsemide  (DEMADEX ) 20 MG tablet Take 1 tablet (20 mg total) by mouth daily. 12/07/22   Croitoru, Mihai, MD  traZODone  (DESYREL ) 50 MG tablet TAKE 2 TABLETS (100 MG TOTAL) BY MOUTH AS NEEDED. 01/02/24   Joshua Debby CROME, MD  triamcinolone  ointment (KENALOG ) 0.1 % Apply 1 gram twice daily to affected areas of skin. Stop once resolved and restart as needed for flares. Avoid use on face, armpits, groin unless otherwise indicated. 01/03/24   Raymund Lauraine BROCKS, MD  valACYclovir  (VALTREX ) 1000 MG tablet Take 1 tablet by mouth daily Patient taking differently: Take 1,000 mg by mouth as needed. 05/24/21   Joshua Debby CROME, MD    Physical Exam    Vital Signs:  Cheryl Taylor does not have vital signs available for review today.  Given telephonic nature of communication, physical exam is limited. AAOx3. NAD. Normal affect.  Speech and respirations are unlabored.  Accessory Clinical Findings    None  Assessment & Plan    1.  Preoperative Cardiovascular Risk Assessment:  According to the Revised Cardiac Risk Index (RCRI), her Perioperative Risk of Major Cardiac Event is (%): 0.4. Her Functional Capacity in METs is: 7.99 according to the [facility] Activity Status Index (DASI). Therefore, based on ACC/AHA guidelines, patient would be at acceptable risk for the planned procedure without further cardiovascular testing.  The patient was advised that if she develops new symptoms prior to surgery to contact our office to arrange for a follow-up visit, and she verbalized understanding.  A copy of  this note will be routed to requesting surgeon.  Time:   Today, I have spent 5 minutes with the patient with telehealth technology discussing medical history, symptoms, and management plan.     Damien BROCKS Braver, NP  02/09/2024, 2:25 PM     [1] No Known Allergies  "

## 2024-02-09 NOTE — Telephone Encounter (Signed)
 Patient called stating that she had received cardiology clearance, confirmed note in the chart. Patient scheduled for appointment to discuss surgery with Dr. Claudene on 02/21/24 and surgery being scheduled for 02/27/24. Patient complaining of having no feeling whatsoever in her hand at this point and expressed concern at waiting. Informed her that she would be placed on a wait list in case something sooner becomes available. Additionally informed that surgery would be cancelled if she misses appointment with Dr. Claudene on 1/21. Post ops made.

## 2024-02-09 NOTE — Progress Notes (Signed)
 Unable to reach patient at appointment time. No Charge.

## 2024-02-09 NOTE — Telephone Encounter (Signed)
 Patient taken off the OR board.

## 2024-02-12 NOTE — Progress Notes (Signed)
 Cardiac Clearance:  Acceptable Risk   Daneen Damien BROCKS, NP  Nurse Practitioner Cardiology   Progress Notes    Signed   Encounter Date: 02/09/2024   Signed     Expand All Collapse All      Virtual Visit via Telephone Note    Because of Cheryl Taylor co-morbid illnesses, she is at least at moderate risk for complications without adequate follow up.  This format is felt to be most appropriate for this patient at this time.  Due to technical limitations with video connection (technology), today's appointment will be conducted as an audio only telehealth visit, and Cheryl Taylor verbally agreed to proceed in this manner.   All issues noted in this document were discussed and addressed.  No physical exam could be performed with this format.   Evaluation Performed:  Preoperative cardiovascular risk assessment _____________    Date:  02/09/2024    Patient ID:  Cheryl Taylor, DOB Dec 06, 1965, MRN [id number] Patient Location:  Home Provider location:   Office   Primary Care Provider:  Joshua Debby CROME, MD Primary Cardiologist:  Jerel Balding, MD   Chief Complaint / Patient Profile    59 y.o. y/o female with a h/o chronic diastolic heart failure, aortic stenosis, mitral valve regurgitation, hypertension, hyperlipidemia, and OSA who is pending  ULTRASOUND GUIDED CARPAL TUNNEL RELEASE, (RIGHT) on 02/13/2024 with Dr. Penne Sharps of [facility] at [facility] and presents today for telephonic preoperative cardiovascular risk assessment.   History of Present Illness    Cheryl Taylor is a 59 y.o. female who presents via audio/video conferencing for a telehealth visit today.  Pt was last seen in cardiology clinic on 09/08/2023 by Reche Finder, NP. At that time Cheryl Taylor was doing well.  The patient is now pending procedure as outlined above. Since her last visit, she has done well from a cardiac standpoint.  She is recovering from the flu.   She denies chest pain,  palpitations, dyspnea, pnd, orthopnea, n, v, dizziness, syncope, edema, weight gain, or early satiety. All other systems reviewed and are otherwise negative except as noted above.    Past Medical History        Past Medical History:  Diagnosis Date   Alcohol  abuse, in remission     Anger     Anxiety     Arthritis      feet    Back pain     Cataract     Chest pain     Deficiency anemia 07/31/2019   Depression     Drug use     Fatigue 02/15/2015   GERD (gastroesophageal reflux disease)     Heart murmur     HTN (hypertension)     Hyperlipidemia     Obesity 08/10/2014   Obesity     Pain in both feet      arthritis / Hallox Rigidus   Perimenopausal 11/18/2013   Raynaud's disease     Screening for cervical cancer 02/06/2015   Sleep apnea      wears cpap    SOB (shortness of breath)     Vitamin D  deficiency               Past Surgical History:  Procedure Laterality Date   CESAREAN SECTION   2003   TONSILLECTOMY   1980          Allergies   [Allergies]  [Allergies] No Known Allergies   Home Medications  Prior to Admission medications  Medication Sig Start Date End Date Taking? Authorizing Provider  amLODipine  (NORVASC ) 10 MG tablet TAKE 1 TABLET BY MOUTH EVERY DAY 01/05/24     Joshua Debby CROME, MD  Armodafinil  250 MG tablet TAKE 1 TABLET BY MOUTH EVERY DAY 01/16/24     Joshua Debby CROME, MD  carvedilol  (COREG ) 6.25 MG tablet TAKE 1 TABLET BY MOUTH TWICE A DAY WITH FOOD 12/01/23     Joshua Debby CROME, MD  Cholecalciferol (VITAMIN D3) 50 MCG (2000 UT) capsule Take 2,000 Units by mouth daily.       [provider]  cyanocobalamin  (VITAMIN B12) 1000 MCG/ML injection INJECT 1 VIAL AS DIRECTED ONCE A WEEK. 12/19/23     Joshua Debby CROME, MD  doxepin  (SINEQUAN ) 10 MG capsule Take 1 capsule (10 mg total) by mouth at bedtime as needed. 12/05/23     Joshua Debby CROME, MD  escitalopram  (LEXAPRO ) 20 MG tablet TAKE 1 TABLET BY MOUTH EVERY DAY 12/14/23     Joshua Debby CROME, MD  gabapentin  (NEURONTIN ) 300 MG capsule Take 1 capsule (300 mg total) by mouth at bedtime. 09/26/23     Ulis Bottcher, PA-C  hydrALAZINE  (APRESOLINE ) 50 MG tablet TAKE 1 TABLET BY MOUTH THREE TIMES A DAY 02/05/24     Joshua Debby CROME, MD  losartan  (COZAAR ) 25 MG tablet TAKE 1 TABLET BY MOUTH EVERY DAY IN THE MORNING 08/09/23     Joshua Debby CROME, MD  MULTIPLE VITAMIN PO Take by mouth.       [provider]  omeprazole  (PRILOSEC) 20 MG capsule TAKE 1 CAPSULE BY MOUTH EVERY DAY 12/14/23     Joshua Debby CROME, MD  potassium chloride  SA (KLOR-CON  M) 20 MEQ tablet TAKE 1 TABLET BY MOUTH TWICE A DAY 01/15/24     Joshua Debby CROME, MD  spironolactone  (ALDACTONE ) 25 MG tablet Take 1 tablet (25 mg total) by mouth daily. 12/07/23     Joshua Debby CROME, MD  torsemide  (DEMADEX ) 20 MG tablet Take 1 tablet (20 mg total) by mouth daily. 12/07/22     Croitoru, Mihai, MD  traZODone  (DESYREL ) 50 MG tablet TAKE 2 TABLETS (100 MG TOTAL) BY MOUTH AS NEEDED. 01/02/24     Joshua Debby CROME, MD  triamcinolone  ointment (KENALOG ) 0.1 % Apply 1 gram twice daily to affected areas of skin. Stop once resolved and restart as needed for flares. Avoid use on face, armpits, groin unless otherwise indicated. 01/03/24     Raymund Lauraine BROCKS, MD  valACYclovir  (VALTREX ) 1000 MG tablet Take 1 tablet by mouth daily Patient taking differently: Take 1,000 mg by mouth as needed. 05/24/21     Joshua Debby CROME, MD      Physical Exam    Vital Signs:  Cheryl Taylor does not have vital signs available for review today.   Given telephonic nature of communication, physical exam is limited. AAOx3. NAD. Normal affect.  Speech and respirations are unlabored.   Accessory Clinical Findings    None   Assessment & Plan    1.  Preoperative Cardiovascular Risk Assessment:   According to the Revised Cardiac Risk Index (RCRI), her Perioperative Risk of Major Cardiac Event is (%): 0.4. Her Functional Capacity in METs is: 7.99 according to the  [facility] Activity Status Index (DASI). Therefore, based on ACC/AHA guidelines, patient would be at acceptable risk for the planned procedure without further cardiovascular testing.   The patient was advised that if she develops new symptoms prior  to surgery to contact our office to arrange for a follow-up visit, and she verbalized understanding.   A copy of this note will be routed to requesting surgeon.   Time:   Today, I have spent 5 minutes with the patient with telehealth technology discussing medical history, symptoms, and management plan.       Damien JAYSON Braver, NP   02/09/2024, 2:25 PM          Electronically signed by Braver Damien JAYSON, NP at 02/09/2024  2:25 PM     Telephone Visit  on 02/09/2024       Routing History       Detailed Report      Note viewed by patient Additional Documentation  Vitals: LMP 11/13/2017  Flowsheets: RCRI,   DASI  Encounter Info: Billing Info,   History,   Allergies,   Detailed Report

## 2024-02-13 ENCOUNTER — Encounter: Admission: RE | Payer: Self-pay | Source: Home / Self Care

## 2024-02-13 ENCOUNTER — Other Ambulatory Visit: Payer: Self-pay | Admitting: Physician Assistant

## 2024-02-13 ENCOUNTER — Ambulatory Visit: Admission: RE | Admit: 2024-02-13 | Source: Home / Self Care | Admitting: Neurosurgery

## 2024-02-13 ENCOUNTER — Other Ambulatory Visit: Payer: Self-pay | Admitting: Internal Medicine

## 2024-02-13 ENCOUNTER — Other Ambulatory Visit: Payer: Self-pay | Admitting: Cardiovascular Disease

## 2024-02-13 DIAGNOSIS — E876 Hypokalemia: Secondary | ICD-10-CM

## 2024-02-13 DIAGNOSIS — I10 Essential (primary) hypertension: Secondary | ICD-10-CM

## 2024-02-13 DIAGNOSIS — Z01818 Encounter for other preprocedural examination: Secondary | ICD-10-CM

## 2024-02-13 DIAGNOSIS — E871 Hypo-osmolality and hyponatremia: Secondary | ICD-10-CM

## 2024-02-13 SURGERY — CARPAL TUNNEL RELEASE
Anesthesia: Monitor Anesthesia Care | Laterality: Right

## 2024-02-14 NOTE — Telephone Encounter (Signed)
 Please refill torsemide , increase KCl supplement to 20 mEq three times daily, repeat a BMET in 2 weeks and schedule an office visit with me or APP in next 3 months

## 2024-02-14 NOTE — Telephone Encounter (Signed)
 In accordance with refill protocols, please review and address the following requirements before this medication refill can be authorized: Potassium not done in last 6 months

## 2024-02-20 ENCOUNTER — Encounter

## 2024-02-21 ENCOUNTER — Encounter: Payer: Self-pay | Admitting: Neurosurgery

## 2024-02-21 ENCOUNTER — Ambulatory Visit: Admitting: Neurosurgery

## 2024-02-21 VITALS — BP 146/76 | Ht 64.0 in | Wt 170.0 lb

## 2024-02-21 DIAGNOSIS — G5603 Carpal tunnel syndrome, bilateral upper limbs: Secondary | ICD-10-CM | POA: Diagnosis not present

## 2024-02-21 DIAGNOSIS — G5601 Carpal tunnel syndrome, right upper limb: Secondary | ICD-10-CM | POA: Insufficient documentation

## 2024-02-21 DIAGNOSIS — G5602 Carpal tunnel syndrome, left upper limb: Secondary | ICD-10-CM | POA: Insufficient documentation

## 2024-02-21 NOTE — H&P (View-Only) (Signed)
 "  Referring Physician:  Joshua Debby CROME, MD [address] [place],  [place] [address]  Primary Physician:  Joshua Debby CROME, MD  Discussed the use of AI scribe software for clinical note transcription with the patient, who gave verbal consent to proceed.  History of Present Illness Cheryl Taylor is a 59 year old female with severe right carpal tunnel syndrome who presents with progressive right hand numbness and tingling.  Over 6 to 8 months she has had worsening numbness and paresthesia in the right hand, starting in the thumb and now involving nearly the entire hand. The thumb feels very numb and dead, and she subjectively cannot perceive sensation there. Symptoms are worst at night, often waking her, and are influenced by sleeping position. A nocturnal wrist brace provides partial relief. She denies pain and describes the hand as mainly numb and tingly, with occasional proximal radiation up the arm.  She notes reduced grip strength with object dropping due to weakness and sensory loss, though she can still move the hand and thumb with some weakness that worsens with forceful gripping. The left hand has milder similar symptoms that have not yet been evaluated.  An EMG about six weeks ago showed absent sensory function in the right median nerve. She is concerned about the chronicity of symptoms and potential for sensory recovery and is seeking counseling on prognosis and postoperative expectations, including timing for return to normal activities and travel.   Weakness: none  Bowel/Bladder Dysfunction: none  Conservative measures:  Physical therapy:  has not participated in Multimodal medical therapy including regular antiinflammatories:  Ibuprofen  Injections:  no epidural steroid injections  Past Surgery: no spine surgeries   The symptoms are causing a significant impact on the patient's life.   Review of Systems:  A 10 point review of systems is negative, except  for the pertinent positives and negatives detailed in the HPI.  Past Medical History: Past Medical History:  Diagnosis Date   Alcohol  abuse, in remission    Anger    Anxiety    Arthritis    feet    Back pain    Cataract    Chest pain    Deficiency anemia 07/31/2019   Depression    Drug use    Fatigue 02/15/2015   GERD (gastroesophageal reflux disease)    Heart murmur    HTN (hypertension)    Hyperlipidemia    Obesity 08/10/2014   Obesity    Pain in both feet    arthritis / Hallox Rigidus   Perimenopausal 11/18/2013   Raynaud's disease    Screening for cervical cancer 02/06/2015   Sleep apnea    wears cpap    SOB (shortness of breath)    Vitamin D  deficiency     Past Surgical History: Past Surgical History:  Procedure Laterality Date   CESAREAN SECTION  2003   TONSILLECTOMY  1980    Allergies: Allergies as of 02/21/2024   (No Known Allergies)    Medications: Outpatient Encounter Medications as of 02/21/2024  Medication Sig   amLODipine  (NORVASC ) 10 MG tablet TAKE 1 TABLET BY MOUTH EVERY DAY   Armodafinil  250 MG tablet TAKE 1 TABLET BY MOUTH EVERY DAY   carvedilol  (COREG ) 6.25 MG tablet TAKE 1 TABLET BY MOUTH TWICE A DAY WITH FOOD   Cholecalciferol (VITAMIN D3) 50 MCG (2000 UT) capsule Take 2,000 Units by mouth daily.   cyanocobalamin  (VITAMIN B12) 1000 MCG/ML injection INJECT 1 VIAL AS DIRECTED ONCE A WEEK.  doxepin  (SINEQUAN ) 10 MG capsule Take 1 capsule (10 mg total) by mouth at bedtime as needed.   escitalopram  (LEXAPRO ) 20 MG tablet TAKE 1 TABLET BY MOUTH EVERY DAY   gabapentin  (NEURONTIN ) 300 MG capsule Take 1 capsule (300 mg total) by mouth at bedtime.   hydrALAZINE  (APRESOLINE ) 50 MG tablet TAKE 1 TABLET BY MOUTH THREE TIMES A DAY   losartan  (COZAAR ) 25 MG tablet TAKE 1 TABLET BY MOUTH EVERY DAY IN THE MORNING   MULTIPLE VITAMIN PO Take by mouth.   omeprazole  (PRILOSEC) 20 MG capsule TAKE 1 CAPSULE BY MOUTH EVERY DAY   potassium chloride  SA (KLOR-CON   M) 20 MEQ tablet TAKE 1 TABLET BY MOUTH TWICE A DAY   spironolactone  (ALDACTONE ) 25 MG tablet Take 1 tablet (25 mg total) by mouth daily.   torsemide  (DEMADEX ) 20 MG tablet TAKE 1 TABLET BY MOUTH EVERY DAY   traZODone  (DESYREL ) 50 MG tablet TAKE 2 TABLETS (100 MG TOTAL) BY MOUTH AS NEEDED.   triamcinolone  ointment (KENALOG ) 0.1 % Apply 1 gram twice daily to affected areas of skin. Stop once resolved and restart as needed for flares. Avoid use on face, armpits, groin unless otherwise indicated.   valACYclovir  (VALTREX ) 1000 MG tablet Take 1 tablet by mouth daily (Patient taking differently: Take 1,000 mg by mouth as needed.)   [DISCONTINUED] losartan  (COZAAR ) 25 MG tablet TAKE 1 TABLET BY MOUTH EVERY DAY IN THE MORNING   [DISCONTINUED] torsemide  (DEMADEX ) 20 MG tablet Take 1 tablet (20 mg total) by mouth daily.   No facility-administered encounter medications on file as of 02/21/2024.    Social History: Social History   Tobacco Use   Smoking status: Never   Smokeless tobacco: Never  Vaping Use   Vaping status: Never Used  Substance Use Topics   Alcohol  use: No    Alcohol /week: 50.0 standard drinks of alcohol     Types: 50 Glasses of wine per week    Comment: quit ETOH on 2013   Drug use: Yes    Types: Amphetamines    Comment: Ativan  4-5mg  daily, no longer taking    Family Medical History: Family History  Problem Relation Age of Onset   Kidney failure Father 50       deceased   Kidney disease Father    Obesity Father    Seizures Father    Heart disease Father 17   Hypertension Father    Hyperlipidemia Father    Stroke Father    Sleep apnea Father    Schizophrenia Brother        52yo   Alcohol  abuse Brother    Other Brother        chronic pain, 70 yo   Stroke Brother    Alcohol  abuse Sister        45yo   Obesity Paternal Grandfather    Stroke Sister    Depression Mother    Anxiety disorder Mother    Obesity Mother    Colon cancer Neg Hx    Colon polyps Neg Hx     Esophageal cancer Neg Hx    Rectal cancer Neg Hx    Stomach cancer Neg Hx    Physical Exam GENERAL: NAD, pleasant, cooperative MENTAL STATUS: Alert and oriented x3, Language is fluent with good comprehension MOTOR: Muscle bulk and tone are normal, Strength is 5/5 in upper and lower extremities except for slight weakness in right hand in median distribution, Intact fine motor movement without pronator drift REFLEXES: 2+ throughout, Flexor plantar  response SENSATION: 8-5mm of 2 point discriminitation in right hand median, 4-6 on left    Medical Decision Making  Imaging: CLINICAL DATA:  Neck pain with right upper extremity numbness in tingling a few months.   EXAM: CERVICAL SPINE - COMPLETE 4+ VIEW   COMPARISON:  None Available.   FINDINGS: Vertebral body alignment and heights are normal. There is mild spondylosis throughout the cervical spine most notable over the mid to lower spine. Mild disc space narrowing at the C5-6 level. Moderate left-sided neural foraminal narrowing at the C4-5 level and moderate right-sided neural foraminal narrowing at the C5-6 level. Minimal left-sided neural foraminal narrowing at the C5-6 level. Atlantoaxial articulation is normal. Prevertebral soft tissues are normal. There is uncovertebral joint spurring and facet arthropathy present.   IMPRESSION: 1. 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 2. Moderate left-sided neural foraminal narrowing at the C4-5 level and moderate right-sided neural foraminal narrowing at the C5-6 level.  I have personally reviewed the images and agree with the above interpretation.    [facility]  [address] [place], [address]  [place], [place] [address] Tel: [phone] Fax: [phone] Test Date:  01/08/2024   Patient: Cheryl Taylor DOB: 1965/07/17 Physician: Venetia Potters, MD  Sex: Female Height: 5' 4 Ref Phys: Lyle Decamp, DEVONNA  ID#: [id number]      Technician:      History: This is a 59 year old female with numbness, tingling, and weakness in her right hand.   NCV & EMG Findings: Extensive electrodiagnostic evaluation of the right upper limb shows: Right median sensory response is absent. Right ulnar and radial sensory responses are within normal limits. Right median (APB) motor response shows prolonged distal onset latency (4.7 ms) and reduced amplitude (2.4 mV). Right ulnar (ADM) motor response is within normal limits. Chronic motor axon loss changes without accompanying active denervation changes are seen in the right abductor pollicis brevis (APB) muscle.   Impression: This is an abnormal study. The findings are most consistent with the following: Right median mononeuropathy at or distal to the wrist, consistent with carpal tunnel syndrome, severe in degree electrically. No electrodiagnostic evidence of a right cervical (C5-T1) motor radiculopathy. Screening studies for right ulnar or radial mononeuropathies are normal.       ___________________________ Venetia Potters, MD  Assessment & Plan Carpal tunnel syndrome, right upper limb She has chronic, progressive carpal tunnel syndrome of the right upper limb with numbness, paresthesia, and mild weakness, worse nocturnally. Sensory function is diminished but preserved, 8-82mm of 2 pt discrimination. Provocative signs are present. EMG with severe CTS.  -OR for Carpal tunnel release with US  guidance.  Carpal tunnel syndrome, left upper limb Worsening symptoms, more mild than right, but present and progressing. No motor deficits, sensation at 4-15mm of 2pt discrimination - Ordered EMG and nerve conduction study for the left hand.   "

## 2024-02-21 NOTE — Progress Notes (Signed)
 "  Referring Physician:  Joshua Debby CROME, MD [address] [place],  [place] [address]  Primary Physician:  Joshua Debby CROME, MD  Discussed the use of AI scribe software for clinical note transcription with the patient, who gave verbal consent to proceed.  History of Present Illness Cheryl Taylor is a 59 year old female with severe right carpal tunnel syndrome who presents with progressive right hand numbness and tingling.  Over 6 to 8 months she has had worsening numbness and paresthesia in the right hand, starting in the thumb and now involving nearly the entire hand. The thumb feels very numb and dead, and she subjectively cannot perceive sensation there. Symptoms are worst at night, often waking her, and are influenced by sleeping position. A nocturnal wrist brace provides partial relief. She denies pain and describes the hand as mainly numb and tingly, with occasional proximal radiation up the arm.  She notes reduced grip strength with object dropping due to weakness and sensory loss, though she can still move the hand and thumb with some weakness that worsens with forceful gripping. The left hand has milder similar symptoms that have not yet been evaluated.  An EMG about six weeks ago showed absent sensory function in the right median nerve. She is concerned about the chronicity of symptoms and potential for sensory recovery and is seeking counseling on prognosis and postoperative expectations, including timing for return to normal activities and travel.   Weakness: none  Bowel/Bladder Dysfunction: none  Conservative measures:  Physical therapy:  has not participated in Multimodal medical therapy including regular antiinflammatories:  Ibuprofen  Injections:  no epidural steroid injections  Past Surgery: no spine surgeries   The symptoms are causing a significant impact on the patient's life.   Review of Systems:  A 10 point review of systems is negative, except  for the pertinent positives and negatives detailed in the HPI.  Past Medical History: Past Medical History:  Diagnosis Date   Alcohol  abuse, in remission    Anger    Anxiety    Arthritis    feet    Back pain    Cataract    Chest pain    Deficiency anemia 07/31/2019   Depression    Drug use    Fatigue 02/15/2015   GERD (gastroesophageal reflux disease)    Heart murmur    HTN (hypertension)    Hyperlipidemia    Obesity 08/10/2014   Obesity    Pain in both feet    arthritis / Hallox Rigidus   Perimenopausal 11/18/2013   Raynaud's disease    Screening for cervical cancer 02/06/2015   Sleep apnea    wears cpap    SOB (shortness of breath)    Vitamin D  deficiency     Past Surgical History: Past Surgical History:  Procedure Laterality Date   CESAREAN SECTION  2003   TONSILLECTOMY  1980    Allergies: Allergies as of 02/21/2024   (No Known Allergies)    Medications: Outpatient Encounter Medications as of 02/21/2024  Medication Sig   amLODipine  (NORVASC ) 10 MG tablet TAKE 1 TABLET BY MOUTH EVERY DAY   Armodafinil  250 MG tablet TAKE 1 TABLET BY MOUTH EVERY DAY   carvedilol  (COREG ) 6.25 MG tablet TAKE 1 TABLET BY MOUTH TWICE A DAY WITH FOOD   Cholecalciferol (VITAMIN D3) 50 MCG (2000 UT) capsule Take 2,000 Units by mouth daily.   cyanocobalamin  (VITAMIN B12) 1000 MCG/ML injection INJECT 1 VIAL AS DIRECTED ONCE A WEEK.  doxepin  (SINEQUAN ) 10 MG capsule Take 1 capsule (10 mg total) by mouth at bedtime as needed.   escitalopram  (LEXAPRO ) 20 MG tablet TAKE 1 TABLET BY MOUTH EVERY DAY   gabapentin  (NEURONTIN ) 300 MG capsule Take 1 capsule (300 mg total) by mouth at bedtime.   hydrALAZINE  (APRESOLINE ) 50 MG tablet TAKE 1 TABLET BY MOUTH THREE TIMES A DAY   losartan  (COZAAR ) 25 MG tablet TAKE 1 TABLET BY MOUTH EVERY DAY IN THE MORNING   MULTIPLE VITAMIN PO Take by mouth.   omeprazole  (PRILOSEC) 20 MG capsule TAKE 1 CAPSULE BY MOUTH EVERY DAY   potassium chloride  SA (KLOR-CON   M) 20 MEQ tablet TAKE 1 TABLET BY MOUTH TWICE A DAY   spironolactone  (ALDACTONE ) 25 MG tablet Take 1 tablet (25 mg total) by mouth daily.   torsemide  (DEMADEX ) 20 MG tablet TAKE 1 TABLET BY MOUTH EVERY DAY   traZODone  (DESYREL ) 50 MG tablet TAKE 2 TABLETS (100 MG TOTAL) BY MOUTH AS NEEDED.   triamcinolone  ointment (KENALOG ) 0.1 % Apply 1 gram twice daily to affected areas of skin. Stop once resolved and restart as needed for flares. Avoid use on face, armpits, groin unless otherwise indicated.   valACYclovir  (VALTREX ) 1000 MG tablet Take 1 tablet by mouth daily (Patient taking differently: Take 1,000 mg by mouth as needed.)   [DISCONTINUED] losartan  (COZAAR ) 25 MG tablet TAKE 1 TABLET BY MOUTH EVERY DAY IN THE MORNING   [DISCONTINUED] torsemide  (DEMADEX ) 20 MG tablet Take 1 tablet (20 mg total) by mouth daily.   No facility-administered encounter medications on file as of 02/21/2024.    Social History: Social History   Tobacco Use   Smoking status: Never   Smokeless tobacco: Never  Vaping Use   Vaping status: Never Used  Substance Use Topics   Alcohol  use: No    Alcohol /week: 50.0 standard drinks of alcohol     Types: 50 Glasses of wine per week    Comment: quit ETOH on 2013   Drug use: Yes    Types: Amphetamines    Comment: Ativan  4-5mg  daily, no longer taking    Family Medical History: Family History  Problem Relation Age of Onset   Kidney failure Father 66       deceased   Kidney disease Father    Obesity Father    Seizures Father    Heart disease Father 23   Hypertension Father    Hyperlipidemia Father    Stroke Father    Sleep apnea Father    Schizophrenia Brother        52yo   Alcohol  abuse Brother    Other Brother        chronic pain, 5 yo   Stroke Brother    Alcohol  abuse Sister        45yo   Obesity Paternal Grandfather    Stroke Sister    Depression Mother    Anxiety disorder Mother    Obesity Mother    Colon cancer Neg Hx    Colon polyps Neg Hx     Esophageal cancer Neg Hx    Rectal cancer Neg Hx    Stomach cancer Neg Hx    Physical Exam GENERAL: NAD, pleasant, cooperative MENTAL STATUS: Alert and oriented x3, Language is fluent with good comprehension MOTOR: Muscle bulk and tone are normal, Strength is 5/5 in upper and lower extremities except for slight weakness in right hand in median distribution, Intact fine motor movement without pronator drift REFLEXES: 2+ throughout, Flexor plantar  response SENSATION: 8-32mm of 2 point discriminitation in right hand median, 4-6 on left    Medical Decision Making  Imaging: CLINICAL DATA:  Neck pain with right upper extremity numbness in tingling a few months.   EXAM: CERVICAL SPINE - COMPLETE 4+ VIEW   COMPARISON:  None Available.   FINDINGS: Vertebral body alignment and heights are normal. There is mild spondylosis throughout the cervical spine most notable over the mid to lower spine. Mild disc space narrowing at the C5-6 level. Moderate left-sided neural foraminal narrowing at the C4-5 level and moderate right-sided neural foraminal narrowing at the C5-6 level. Minimal left-sided neural foraminal narrowing at the C5-6 level. Atlantoaxial articulation is normal. Prevertebral soft tissues are normal. There is uncovertebral joint spurring and facet arthropathy present.   IMPRESSION: 1. 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 2. Moderate left-sided neural foraminal narrowing at the C4-5 level and moderate right-sided neural foraminal narrowing at the C5-6 level.  I have personally reviewed the images and agree with the above interpretation.    [facility]  [address] [place], [address]  [place], [place] [address] Tel: [phone] Fax: [phone] Test Date:  01/08/2024   Patient: Cheryl Taylor DOB: 12-Mar-1965 Physician: Venetia Potters, MD  Sex: Female Height: 5' 4 Ref Phys: Lyle Decamp, DEVONNA  ID#: [id number]      Technician:      History: This is a 59 year old female with numbness, tingling, and weakness in her right hand.   NCV & EMG Findings: Extensive electrodiagnostic evaluation of the right upper limb shows: Right median sensory response is absent. Right ulnar and radial sensory responses are within normal limits. Right median (APB) motor response shows prolonged distal onset latency (4.7 ms) and reduced amplitude (2.4 mV). Right ulnar (ADM) motor response is within normal limits. Chronic motor axon loss changes without accompanying active denervation changes are seen in the right abductor pollicis brevis (APB) muscle.   Impression: This is an abnormal study. The findings are most consistent with the following: Right median mononeuropathy at or distal to the wrist, consistent with carpal tunnel syndrome, severe in degree electrically. No electrodiagnostic evidence of a right cervical (C5-T1) motor radiculopathy. Screening studies for right ulnar or radial mononeuropathies are normal.       ___________________________ Venetia Potters, MD  Assessment & Plan Carpal tunnel syndrome, right upper limb She has chronic, progressive carpal tunnel syndrome of the right upper limb with numbness, paresthesia, and mild weakness, worse nocturnally. Sensory function is diminished but preserved, 8-110mm of 2 pt discrimination. Provocative signs are present. EMG with severe CTS.  -OR for Carpal tunnel release with US  guidance.  Carpal tunnel syndrome, left upper limb Worsening symptoms, more mild than right, but present and progressing. No motor deficits, sensation at 4-48mm of 2pt discrimination - Ordered EMG and nerve conduction study for the left hand.   "

## 2024-02-22 ENCOUNTER — Other Ambulatory Visit: Payer: Self-pay | Admitting: Internal Medicine

## 2024-02-22 DIAGNOSIS — I10 Essential (primary) hypertension: Secondary | ICD-10-CM

## 2024-02-23 ENCOUNTER — Inpatient Hospital Stay: Admitting: Hematology and Oncology

## 2024-02-23 ENCOUNTER — Telehealth: Payer: Self-pay

## 2024-02-23 ENCOUNTER — Inpatient Hospital Stay: Attending: Physician Assistant

## 2024-02-23 NOTE — Telephone Encounter (Signed)
 Attempted to call patient's husband. Left voicemail relaying the same information left for patient.

## 2024-02-23 NOTE — Progress Notes (Unsigned)
 " [facility] [phone]   [phone]  PROGRESS NOTE  Patient Care Team: Joshua Debby CROME, MD as PCP - General (Internal Medicine) Croitoru, Jerel, MD as PCP - Cardiology (Cardiology) Merceda Lela SAUNDERS, RPH-CPP (Pharmacist)   CHIEF COMPLAINTS/PURPOSE OF CONSULTATION:  Normocytic anemia  HISTORY OF PRESENTING ILLNESS:  Cheryl Taylor 59 y.o. female returns for a follow up for normocytic anemia.   On exam today, Cheryl Taylor reports ***   MEDICAL HISTORY:  Past Medical History:  Diagnosis Date   Alcohol  abuse, in remission    Anger    Anxiety    Arthritis    feet    Back pain    Cataract    Chest pain    Deficiency anemia 07/31/2019   Depression    Drug use    Fatigue 02/15/2015   GERD (gastroesophageal reflux disease)    Heart murmur    HTN (hypertension)    Hyperlipidemia    Obesity 08/10/2014   Obesity    Pain in both feet    arthritis / Hallox Rigidus   Perimenopausal 11/18/2013   Raynaud's disease    Screening for cervical cancer 02/06/2015   Sleep apnea    wears cpap    SOB (shortness of breath)    Vitamin D  deficiency     SURGICAL HISTORY: Past Surgical History:  Procedure Laterality Date   CESAREAN SECTION  2003   TONSILLECTOMY  1980    SOCIAL HISTORY: Social History   Socioeconomic History   Marital status: Married    Spouse name: Joe   Number of children: 1   Years of education: Not on file   Highest education level: Bachelor's degree (e.g., BA, AB, BS)  Occupational History   Occupation: Best Boy     Comment: [employer]  Tobacco Use   Smoking status: Never   Smokeless tobacco: Never  Vaping Use   Vaping status: Never Used  Substance and Sexual Activity   Alcohol  use: No    Alcohol /week: 50.0 standard drinks of alcohol     Types: 50 Glasses of wine per week    Comment: quit ETOH on 2013   Drug use: Yes    Types: Amphetamines    Comment: Ativan  4-5mg  daily, no longer taking   Sexual  activity: Yes    Partners: Male    Birth control/protection: Pill    Comment: lives with husband and daughter, works at [employer], no dietary restrictions  Other Topics Concern   Not on file  Social History Narrative   Lives wih husband and daughter   Right handed   Caffeine: 1-2 cups a day   Social Drivers of Health   Tobacco Use: Low Risk (02/21/2024)   Patient History    Smoking Tobacco Use: Never    Smokeless Tobacco Use: Never    Passive Exposure: Not on file  Financial Resource Strain: Low Risk (12/04/2023)   Overall Financial Resource Strain (CARDIA)    Difficulty of Paying Living Expenses: Not hard at all  Food Insecurity: No Food Insecurity (12/04/2023)   Epic    Worried About Programme Researcher, Broadcasting/film/video in the Last Year: Never true    Ran Out of Food in the Last Year: Never true  Transportation Needs: No Transportation Needs (12/04/2023)   Epic    Lack of Transportation (Medical): No    Lack of Transportation (Non-Medical): No  Physical Activity: Inactive (12/04/2023)   Exercise Vital Sign    Days of Exercise per  Week: 0 days    Minutes of Exercise per Session: Not on file  Stress: Stress Concern Present (12/04/2023)   [employer] of Occupational Health - Occupational Stress Questionnaire    Feeling of Stress: To some extent  Social Connections: Moderately Integrated (12/04/2023)   Social Connection and Isolation Panel    Frequency of Communication with Friends and Family: Twice a week    Frequency of Social Gatherings with Friends and Family: Once a week    Attends Religious Services: Never    Database Administrator or Organizations: Yes    Attends Engineer, Structural: More than 4 times per year    Marital Status: Married  Catering Manager Violence: Not on file  Depression (PHQ2-9): Medium Risk (10/27/2023)   Depression (PHQ2-9)    PHQ-2 Score: 5  Alcohol  Screen: Low Risk (03/02/2023)   Alcohol  Screen    Last Alcohol  Screening Score (AUDIT): 0   Housing: Low Risk (12/04/2023)   Epic    Unable to Pay for Housing in the Last Year: No    Number of Times Moved in the Last Year: 0    Homeless in the Last Year: No  Utilities: Not on file  Health Literacy: Not on file    FAMILY HISTORY: Family History  Problem Relation Age of Onset   Taylor failure Father 72       deceased   Taylor disease Father    Obesity Father    Seizures Father    Heart disease Father 65   Hypertension Father    Hyperlipidemia Father    Stroke Father    Sleep apnea Father    Schizophrenia Brother        52yo   Alcohol  abuse Brother    Other Brother        chronic pain, 52 yo   Stroke Brother    Alcohol  abuse Sister        45yo   Obesity Paternal Grandfather    Stroke Sister    Depression Mother    Anxiety disorder Mother    Obesity Mother    Colon cancer Neg Hx    Colon polyps Neg Hx    Esophageal cancer Neg Hx    Rectal cancer Neg Hx    Stomach cancer Neg Hx     ALLERGIES:  has no known allergies.  MEDICATIONS:  Current Outpatient Medications  Medication Sig Dispense Refill   amLODipine  (NORVASC ) 10 MG tablet TAKE 1 TABLET BY MOUTH EVERY DAY 90 tablet 0   Armodafinil  250 MG tablet TAKE 1 TABLET BY MOUTH EVERY DAY 30 tablet 2   carvedilol  (COREG ) 6.25 MG tablet TAKE 1 TABLET BY MOUTH TWICE A DAY WITH FOOD 180 tablet 0   Cholecalciferol (VITAMIN D3) 50 MCG (2000 UT) capsule Take 2,000 Units by mouth daily.     cyanocobalamin  (VITAMIN B12) 1000 MCG/ML injection INJECT 1 VIAL AS DIRECTED ONCE A WEEK. 12 mL 0   doxepin  (SINEQUAN ) 10 MG capsule Take 1 capsule (10 mg total) by mouth at bedtime as needed. 90 capsule 1   escitalopram  (LEXAPRO ) 20 MG tablet TAKE 1 TABLET BY MOUTH EVERY DAY 90 tablet 1   gabapentin  (NEURONTIN ) 300 MG capsule Take 1 capsule (300 mg total) by mouth at bedtime. 90 capsule 2   hydrALAZINE  (APRESOLINE ) 50 MG tablet TAKE 1 TABLET BY MOUTH THREE TIMES A DAY 270 tablet 0   losartan  (COZAAR ) 25 MG tablet TAKE 1 TABLET BY  MOUTH EVERY DAY IN THE  MORNING 90 tablet 1   MULTIPLE VITAMIN PO Take by mouth.     omeprazole  (PRILOSEC) 20 MG capsule TAKE 1 CAPSULE BY MOUTH EVERY DAY 90 capsule 1   potassium chloride  SA (KLOR-CON  M) 20 MEQ tablet TAKE 1 TABLET BY MOUTH TWICE A DAY 180 tablet 1   spironolactone  (ALDACTONE ) 25 MG tablet Take 1 tablet (25 mg total) by mouth daily. 90 tablet 0   torsemide  (DEMADEX ) 20 MG tablet TAKE 1 TABLET BY MOUTH EVERY DAY 90 tablet 3   traZODone  (DESYREL ) 50 MG tablet TAKE 2 TABLETS (100 MG TOTAL) BY MOUTH AS NEEDED. 180 tablet 1   triamcinolone  ointment (KENALOG ) 0.1 % Apply 1 gram twice daily to affected areas of skin. Stop once resolved and restart as needed for flares. Avoid use on face, armpits, groin unless otherwise indicated. 30 g 2   valACYclovir  (VALTREX ) 1000 MG tablet Take 1 tablet by mouth daily (Patient taking differently: Take 1,000 mg by mouth as needed.) 90 tablet 0   No current facility-administered medications for this visit.    REVIEW OF SYSTEMS:   Constitutional: ( - ) fevers, ( - )  chills , ( - ) night sweats Eyes: ( - ) blurriness of vision, ( - ) double vision, ( - ) watery eyes Ears, nose, mouth, throat, and face: ( - ) mucositis, ( - ) sore throat Respiratory: ( - ) cough, ( - ) dyspnea, ( - ) wheezes Cardiovascular: ( - ) palpitation, ( - ) chest discomfort, ( - ) lower extremity swelling Gastrointestinal:  ( - ) nausea, ( - ) heartburn, ( - ) change in bowel habits Skin: ( - ) abnormal skin rashes Lymphatics: ( - ) new lymphadenopathy, ( - ) easy bruising Neurological: ( - ) numbness, ( - ) tingling, ( - ) new weaknesses Behavioral/Psych: ( - ) mood change, ( - ) new changes  All other systems were reviewed with the patient and are negative.  PHYSICAL EXAMINATION: ECOG PERFORMANCE STATUS: 1 - Symptomatic but completely ambulatory  There were no vitals filed for this visit.  There were no vitals filed for this visit.   GENERAL: well appearing female  in NAD  SKIN: skin color, texture, turgor are normal, no rashes or significant lesions EYES: conjunctiva are pink and non-injected, sclera clear LUNGS: clear to auscultation and percussion with normal breathing effort HEART: regular rate & rhythm and no murmurs and no lower extremity edema Musculoskeletal: no cyanosis of digits and no clubbing  PSYCH: alert & oriented x 3, fluent speech NEURO: no focal motor/sensory deficits  LABORATORY DATA:  I have reviewed the data as listed    Latest Ref Rng & Units 02/06/2024    3:56 PM 01/04/2024    9:05 AM 12/05/2023    2:40 PM  CBC  WBC 4.0 - 10.5 K/uL 10.7  8.5  7.3   Hemoglobin 12.0 - 15.0 g/dL 88.3  89.7  9.9   Hematocrit 36.0 - 46.0 % 34.4  30.3  29.4   Platelets 150 - 400 K/uL 433  338  351.0        Latest Ref Rng & Units 02/06/2024    3:56 PM 12/05/2023    2:40 PM 10/27/2023    8:20 AM  CMP  Glucose 70 - 99 mg/dL 889  96  891   BUN 6 - 20 mg/dL 10  29  14    Creatinine 0.44 - 1.00 mg/dL 9.20  9.18  9.24   Sodium 135 -  145 mmol/L 140  139  140   Potassium 3.5 - 5.1 mmol/L 3.1  3.1  2.8   Chloride 98 - 111 mmol/L 100  95  98   CO2 22 - 32 mmol/L 26  33  34   Calcium  8.9 - 10.3 mg/dL 9.6  9.5  9.6   Total Protein 6.5 - 8.1 g/dL   7.8   Total Bilirubin 0.0 - 1.2 mg/dL   0.2   Alkaline Phos 38 - 126 U/L   88   AST 15 - 41 U/L   34   ALT 0 - 44 U/L   32     RADIOGRAPHIC STUDIES: I have personally reviewed the radiological images as listed and agreed with the findings in the report. No results found.   ASSESSMENT & PLAN Cheryl Taylor is a 59 y.o. female who presents to the hematology clinic for evaluation of normocytic anemia.   #Normocytic anemia #Vitamin B12 deficiency: --Receives Vitamin B12 IM injections at home.  --Prior workup from 04/19/2023 ruled out paraproteinemia and inflammatory markers were normal. Peripheral smear was unremarkable.  --Labs today show stable anemia with Hgb 10.5, MCV 90.1. Iron panel is normal with  ferritin 54. Vitamin B12 doesn't show deficiency and copper  levels are normal.  --Discussed bone marrow biopsy versus continued observation. Since no clear etiology has been found, patient has agreed to proceed with bone marrow biopsy.  --RTC one week after bone marrow biopsy.   #Hypokalemia: --Potassium level is 2.8 today --Advised to take potassium chloride  20 mEq PO daily --Will recheck levels at next visit.   #Fatigue: --Could like secondary to persistent anemia. --Will make referral to endocrinology to determine if hormone dysfunction is contributing to fatigue   No orders of the defined types were placed in this encounter.   All questions were answered. The patient knows to call the clinic with any problems, questions or concerns.  I have spent a total of 25 minutes minutes of face-to-face and non-face-to-face time, preparing to see the patient, performing a medically appropriate examination, counseling and educating the patient, ordering tests/procedures, documenting clinical information in the electronic health record, independently interpreting results and communicating results to the patient, and care coordination.   Cheryl IVAR Kidney, MD Department of Hematology/Oncology [facility] at [facility] Phone: [phone] Pager: [phone] Email: [email or web address]    "

## 2024-02-23 NOTE — Telephone Encounter (Signed)
 Called patient on behalf of the [facility] OR, letting her know that if she is able to make it to the hospital, we plan to proceed with surgery as scheduled. If she will be late or unable to make it for any reason, provided patient with [facility] OR telephone number [phone]. Patient was unavailable, so left voice mail.

## 2024-02-25 ENCOUNTER — Other Ambulatory Visit: Payer: Self-pay | Admitting: Internal Medicine

## 2024-02-25 DIAGNOSIS — I10 Essential (primary) hypertension: Secondary | ICD-10-CM

## 2024-02-25 DIAGNOSIS — E876 Hypokalemia: Secondary | ICD-10-CM

## 2024-02-26 MED ORDER — LACTATED RINGERS IV SOLN: INTRAVENOUS | Status: DC

## 2024-02-26 MED ORDER — CEFAZOLIN IN SODIUM CHLORIDE 2-0.9 GM/100ML-% IV SOLN
2.0000 g | Freq: Once | INTRAVENOUS | Status: DC
  Filled 2024-02-26: qty 100

## 2024-02-26 MED ORDER — CHLORHEXIDINE GLUCONATE 0.12 % MT SOLN
15.0000 mL | Freq: Once | OROMUCOSAL | Status: AC
  Administered 2024-02-27: 15 mL via OROMUCOSAL

## 2024-02-26 MED ORDER — ORAL CARE MOUTH RINSE: 15.0000 mL | Freq: Once | OROMUCOSAL | Status: AC

## 2024-02-26 MED ORDER — CEFAZOLIN SODIUM-DEXTROSE 2-4 GM/100ML-% IV SOLN: 2.0000 g | INTRAVENOUS | Status: DC

## 2024-02-27 ENCOUNTER — Other Ambulatory Visit: Payer: Self-pay

## 2024-02-27 ENCOUNTER — Encounter: Payer: Self-pay | Admitting: Neurology

## 2024-02-27 ENCOUNTER — Encounter: Payer: Self-pay | Admitting: Certified Registered"

## 2024-02-27 ENCOUNTER — Encounter: Payer: Self-pay | Admitting: Neurosurgery

## 2024-02-27 ENCOUNTER — Encounter: Admitting: Physician Assistant

## 2024-02-27 ENCOUNTER — Encounter: Admission: RE | Disposition: A | Payer: Self-pay | Source: Home / Self Care | Attending: Neurosurgery

## 2024-02-27 ENCOUNTER — Other Ambulatory Visit: Payer: Self-pay | Admitting: Internal Medicine

## 2024-02-27 ENCOUNTER — Ambulatory Visit
Admission: RE | Admit: 2024-02-27 | Discharge: 2024-02-27 | Disposition: A | Attending: Neurosurgery | Admitting: Neurosurgery

## 2024-02-27 ENCOUNTER — Encounter: Payer: Self-pay | Admitting: Internal Medicine

## 2024-02-27 DIAGNOSIS — B86 Scabies: Secondary | ICD-10-CM

## 2024-02-27 DIAGNOSIS — Z01818 Encounter for other preprocedural examination: Secondary | ICD-10-CM

## 2024-02-27 DIAGNOSIS — L089 Local infection of the skin and subcutaneous tissue, unspecified: Secondary | ICD-10-CM | POA: Insufficient documentation

## 2024-02-27 DIAGNOSIS — G5601 Carpal tunnel syndrome, right upper limb: Secondary | ICD-10-CM | POA: Diagnosis present

## 2024-02-27 DIAGNOSIS — Z539 Procedure and treatment not carried out, unspecified reason: Secondary | ICD-10-CM | POA: Diagnosis not present

## 2024-02-27 DIAGNOSIS — L989 Disorder of the skin and subcutaneous tissue, unspecified: Secondary | ICD-10-CM

## 2024-02-27 LAB — POCT I-STAT, CHEM 8
BUN: 15 mg/dL (ref 8–23)
Calcium, Ion: 1.18 mmol/L (ref 1.15–1.40)
Chloride: 103 mmol/L (ref 98–111)
Creatinine, Ser: 0.9 mg/dL (ref 0.44–1.00)
Glucose, Bld: 83 mg/dL (ref 70–99)
HCT: 29 % — ABNORMAL LOW (ref 36.0–46.0)
Hemoglobin: 9.9 g/dL — ABNORMAL LOW (ref 12.0–15.0)
Potassium: 3.3 mmol/L — ABNORMAL LOW (ref 3.5–5.1)
Sodium: 141 mmol/L (ref 135–145)
TCO2: 25 mmol/L (ref 22–32)

## 2024-02-27 MED ORDER — CEFAZOLIN SODIUM-DEXTROSE 2-4 GM/100ML-% IV SOLN
INTRAVENOUS | Status: AC
  Filled 2024-02-27: qty 100

## 2024-02-27 MED ORDER — MIDAZOLAM HCL 2 MG/2ML IJ SOLN
INTRAMUSCULAR | Status: AC
  Filled 2024-02-27: qty 2

## 2024-02-27 MED ORDER — IVERMECTIN 3 MG PO TABS: 200.0000 ug/kg | ORAL_TABLET | Freq: Once | ORAL | 1 refills | Status: AC

## 2024-02-27 MED ORDER — PROPOFOL 1000 MG/100ML IV EMUL
INTRAVENOUS | Status: AC
  Filled 2024-02-27: qty 100

## 2024-02-27 MED ORDER — LIDOCAINE HCL (PF) 1 % IJ SOLN
INTRAMUSCULAR | Status: AC
  Filled 2024-02-27: qty 30

## 2024-02-27 MED ORDER — CHLORHEXIDINE GLUCONATE 0.12 % MT SOLN
OROMUCOSAL | Status: AC
  Filled 2024-02-27: qty 15

## 2024-02-27 MED ORDER — FENTANYL CITRATE (PF) 100 MCG/2ML IJ SOLN
INTRAMUSCULAR | Status: AC
  Filled 2024-02-27: qty 2

## 2024-02-27 MED ORDER — SULFAMETHOXAZOLE-TRIMETHOPRIM 800-160 MG PO TABS: 1.0000 | ORAL_TABLET | Freq: Two times a day (BID) | ORAL | 0 refills | Status: AC

## 2024-02-27 MED ORDER — PROPOFOL 10 MG/ML IV BOLUS
INTRAVENOUS | Status: AC
  Filled 2024-02-27: qty 20

## 2024-02-27 NOTE — Interval H&P Note (Signed)
 Patient presenting to the preoperative area with new open wounds on her forearm and the region of planned surgery.  Given these open wounds, some evidence of drainage and blistering with no treatment we will plan for rescheduling and evaluation by her PCP for treatment prior to intervention. She would be at very high risk for an infection given the proximity to planned incision.

## 2024-02-27 NOTE — Discharge Instructions (Signed)

## 2024-02-27 NOTE — Progress Notes (Signed)
 Patient noted to have sores on operative arm. Dr. Claudene canceled surgery. Pt IV removed. Patient put clothes back on. Patient left with husband.

## 2024-02-28 ENCOUNTER — Telehealth: Payer: Self-pay

## 2024-02-28 ENCOUNTER — Other Ambulatory Visit: Payer: Self-pay | Admitting: Internal Medicine

## 2024-02-28 DIAGNOSIS — F5104 Psychophysiologic insomnia: Secondary | ICD-10-CM

## 2024-02-28 DIAGNOSIS — B86 Scabies: Secondary | ICD-10-CM

## 2024-02-28 NOTE — Telephone Encounter (Signed)
 Pharmacy Patient Advocate Encounter   Received notification from Onbase CMM KEY that prior authorization for Ivermectin  3MG  tablets  is required/requested.   Insurance verification completed.   The patient is insured through [facility] MEDICAID.   Per test claim: PA required; PA submitted to above mentioned insurance via Latent Key/confirmation #/EOC [id number] Status is pending

## 2024-02-29 ENCOUNTER — Ambulatory Visit: Admitting: Neurology

## 2024-03-01 ENCOUNTER — Other Ambulatory Visit (HOSPITAL_COMMUNITY): Payer: Self-pay

## 2024-03-05 NOTE — Telephone Encounter (Signed)
 yes

## 2024-03-06 NOTE — Telephone Encounter (Signed)
 Yes, take the second dose

## 2024-03-07 ENCOUNTER — Encounter: Payer: Self-pay | Admitting: Physician Assistant

## 2024-03-07 ENCOUNTER — Ambulatory Visit: Admitting: Physician Assistant

## 2024-03-07 VITALS — BP 109/68 | HR 67

## 2024-03-07 DIAGNOSIS — R21 Rash and other nonspecific skin eruption: Secondary | ICD-10-CM

## 2024-03-07 DIAGNOSIS — L720 Epidermal cyst: Secondary | ICD-10-CM

## 2024-03-07 DIAGNOSIS — F22 Delusional disorders: Secondary | ICD-10-CM

## 2024-03-07 LAB — POCT SCABIES

## 2024-03-07 MED ORDER — TRIAMCINOLONE ACETONIDE 0.1 % EX CREA: 1.0000 | TOPICAL_CREAM | Freq: Two times a day (BID) | CUTANEOUS | 1 refills | Status: AC

## 2024-03-07 MED ORDER — MUPIROCIN 2 % EX OINT: 1.0000 | TOPICAL_OINTMENT | Freq: Two times a day (BID) | CUTANEOUS | 5 refills | Status: AC

## 2024-03-07 NOTE — Patient Instructions (Signed)

## 2024-03-07 NOTE — Progress Notes (Signed)
" ° °  New Patient Visit   Subjective  Cheryl Taylor is a 59 y.o. female NEW PATIENT who presents for the following: RASH.  Patient has had a itchy rash since last Fall. Very itchy. Has seen Dermatology [facility]) this past December and was was diagnosed with eczema, of which she disagrees. There was mention in prior dermatology note that suggested delusions of parasitosis.   She was then diagnosed with scabies by her PCP and prescribed oral ivermectin  which she said helped as it made them come out of my skin. She states they are all over to include her scalp, arms, legs and the bottoms of her feet (showing me during exam the things that come out of my skin.). It's all I think about. Lives with her husband who is asymptomatic. She had carpal tunnel surgery canceled because of her current rash.      The following portions of the chart were reviewed this encounter and updated as appropriate: medications, allergies, medical history  Review of Systems:  No other skin or systemic complaints except as noted in HPI or Assessment and Plan.  Objective  Well appearing patient in no apparent distress; mood and affect are within normal limits.  A full examination was performed including scalp, head, eyes, ears, nose, lips, neck, chest, axillae, abdomen, back, buttocks, bilateral upper extremities, bilateral lower extremities, hands, feet, fingers, toes, fingernails, and toenails. All findings within normal limits unless otherwise noted below.   Relevant exam findings are noted in the Assessment and Plan.                     Assessment & Plan   DELUSIONS OF PARASITOSIS / PRURITUS   Exam:  - Innumerable excoriated patches as seen in photos above - Scabies prep was negative.  Treatment Plan: - Lengthy conversation regarding her diagnosis today. I recommended she discuss this with her PCP and ask for referral to Psychiatrist. Reassured patient that she does NOT have  scabies. I asked that she try hard not to pick at her skin.  -- Mupirocin  ointment 2% - Apply to affected areas twice daily.  -- Triamcinolone  cream 0.1% -Apply twice daily to affected areas until clear.     EPIDERMAL INCLUSION CYST -- RIGHT UPPER BACK  Exam: Subcutaneous nodule of back   Benign-appearing. Exam most consistent with an epidermal inclusion cyst. Discussed that a cyst is a benign growth that can grow over time and sometimes get irritated or inflamed. Recommend observation if it is not bothersome. Discussed option of surgical excision to remove it if it is growing, symptomatic, or other changes noted. Please call for new or changing lesions so they can be evaluated.  Referral to [facility].  RASH AND OTHER NONSPECIFIC SKIN ERUPTION   This Visit - POCT Scabies EPIDERMAL CYST   This Visit - Ambulatory referral to Plastic Surgery DELUSIONS OF PARASITOSIS [facility])    Return if symptoms worsen or fail to improve.  LILLETTE Virgle Boards, Dermatology [employer] am acting as a neurosurgeon for Cleotis Sparr K, PA-C.  Documentation: I have reviewed the above documentation for accuracy and completeness, and I agree with the above.  Yaritzy Huser K, PA-C   "

## 2024-03-08 ENCOUNTER — Ambulatory Visit: Payer: Self-pay | Admitting: Physician Assistant

## 2024-03-11 ENCOUNTER — Ambulatory Visit: Admitting: Internal Medicine

## 2024-03-12 ENCOUNTER — Encounter: Admitting: Physician Assistant

## 2024-03-14 ENCOUNTER — Institutional Professional Consult (permissible substitution): Admitting: Plastic Surgery

## 2024-03-20 ENCOUNTER — Encounter: Admitting: Neurosurgery

## 2024-04-08 ENCOUNTER — Encounter: Admitting: Neurosurgery
